# Patient Record
Sex: Male | Born: 1945 | Race: White | Hispanic: No | Marital: Married | Smoking: Current every day smoker
Health system: Southern US, Community
[De-identification: ages and names within clinical notes are randomized; demographics above are authoritative.]

## PROBLEM LIST (undated history)

## (undated) ENCOUNTER — Emergency Department (HOSPITAL_COMMUNITY): Disposition: A | Discharge status: Home / Self Care | Payer: Medicare Other

## (undated) DIAGNOSIS — R0600 Dyspnea, unspecified: Secondary | ICD-10-CM

## (undated) DIAGNOSIS — I6529 Occlusion and stenosis of unspecified carotid artery: Secondary | ICD-10-CM

## (undated) DIAGNOSIS — E785 Hyperlipidemia, unspecified: Secondary | ICD-10-CM

## (undated) DIAGNOSIS — K8020 Calculus of gallbladder without cholecystitis without obstruction: Secondary | ICD-10-CM

## (undated) DIAGNOSIS — I255 Ischemic cardiomyopathy: Secondary | ICD-10-CM

## (undated) DIAGNOSIS — I350 Nonrheumatic aortic (valve) stenosis: Secondary | ICD-10-CM

## (undated) DIAGNOSIS — I2510 Atherosclerotic heart disease of native coronary artery without angina pectoris: Secondary | ICD-10-CM

## (undated) DIAGNOSIS — K219 Gastro-esophageal reflux disease without esophagitis: Secondary | ICD-10-CM

## (undated) DIAGNOSIS — M549 Dorsalgia, unspecified: Secondary | ICD-10-CM

## (undated) DIAGNOSIS — I10 Essential (primary) hypertension: Secondary | ICD-10-CM

## (undated) DIAGNOSIS — R1310 Dysphagia, unspecified: Secondary | ICD-10-CM

## (undated) DIAGNOSIS — J449 Chronic obstructive pulmonary disease, unspecified: Secondary | ICD-10-CM

## (undated) DIAGNOSIS — I509 Heart failure, unspecified: Secondary | ICD-10-CM

## (undated) DIAGNOSIS — R93429 Abnormal radiologic findings on diagnostic imaging of unspecified kidney: Secondary | ICD-10-CM

## (undated) HISTORY — DX: Essential (primary) hypertension: I10

## (undated) HISTORY — PX: CORONARY ARTERY BYPASS GRAFT: SHX141

## (undated) HISTORY — DX: Atherosclerotic heart disease of native coronary artery without angina pectoris: I25.10

## (undated) HISTORY — DX: Dorsalgia, unspecified: M54.9

## (undated) HISTORY — DX: Hyperlipidemia, unspecified: E78.5

## (undated) HISTORY — DX: Dysphagia, unspecified: R13.10

## (undated) HISTORY — DX: Occlusion and stenosis of unspecified carotid artery: I65.29

---

## 1986-10-18 HISTORY — PX: HERNIA REPAIR: SHX51

## 1991-10-19 HISTORY — PX: CORONARY ARTERY BYPASS GRAFT: SHX141

## 1999-04-02 ENCOUNTER — Encounter: Payer: Self-pay | Admitting: *Deleted

## 1999-04-02 ENCOUNTER — Inpatient Hospital Stay (HOSPITAL_COMMUNITY): Admission: AD | Admit: 1999-04-02 | Discharge: 1999-04-04 | Payer: Self-pay | Admitting: *Deleted

## 1999-04-03 HISTORY — PX: CARDIAC CATHETERIZATION: SHX172

## 2004-11-23 ENCOUNTER — Ambulatory Visit: Payer: Self-pay | Admitting: Cardiology

## 2005-10-18 HISTORY — PX: CARPAL TUNNEL RELEASE: SHX101

## 2006-09-14 ENCOUNTER — Encounter (INDEPENDENT_AMBULATORY_CARE_PROVIDER_SITE_OTHER): Payer: Self-pay | Admitting: Specialist

## 2006-09-14 ENCOUNTER — Ambulatory Visit (HOSPITAL_BASED_OUTPATIENT_CLINIC_OR_DEPARTMENT_OTHER): Admission: RE | Admit: 2006-09-14 | Discharge: 2006-09-14 | Payer: Self-pay | Admitting: Orthopedic Surgery

## 2008-06-18 ENCOUNTER — Ambulatory Visit: Payer: Self-pay | Admitting: Cardiology

## 2008-07-05 ENCOUNTER — Ambulatory Visit: Payer: Self-pay

## 2008-07-05 ENCOUNTER — Encounter: Payer: Self-pay | Admitting: Cardiology

## 2008-07-22 ENCOUNTER — Ambulatory Visit: Payer: Self-pay | Admitting: Cardiology

## 2009-02-06 DIAGNOSIS — I25810 Atherosclerosis of coronary artery bypass graft(s) without angina pectoris: Secondary | ICD-10-CM | POA: Insufficient documentation

## 2009-02-06 DIAGNOSIS — I10 Essential (primary) hypertension: Secondary | ICD-10-CM

## 2009-02-06 DIAGNOSIS — M549 Dorsalgia, unspecified: Secondary | ICD-10-CM | POA: Insufficient documentation

## 2009-02-10 ENCOUNTER — Ambulatory Visit: Payer: Self-pay | Admitting: Cardiology

## 2009-02-20 ENCOUNTER — Encounter: Payer: Self-pay | Admitting: Cardiology

## 2009-02-24 ENCOUNTER — Encounter: Payer: Self-pay | Admitting: Cardiology

## 2009-02-25 ENCOUNTER — Encounter: Payer: Self-pay | Admitting: Cardiology

## 2009-07-14 ENCOUNTER — Ambulatory Visit: Payer: Self-pay

## 2009-07-14 ENCOUNTER — Encounter: Payer: Self-pay | Admitting: Cardiology

## 2009-07-14 DIAGNOSIS — I6529 Occlusion and stenosis of unspecified carotid artery: Secondary | ICD-10-CM

## 2010-11-07 ENCOUNTER — Encounter: Payer: Self-pay | Admitting: Orthopedic Surgery

## 2010-11-17 NOTE — Letter (Signed)
Summary: [facility] Office Visit Note  [facility] Visit Note   Imported By: Roderic Ovens 05/15/2010 12:15:52  _____________________________________________________________________  External Attachment:    Type:   Image     Comment:   External Document

## 2011-03-02 NOTE — Assessment & Plan Note (Signed)
[facility]                            CARDIOLOGY OFFICE NOTE   Steve Durham, Steve Durham                     MRN:          [id number]  DATE:07/22/2008                            DOB:          June 30, 1946    Steve Durham is in for a followup visit.  I last saw him at the beginning of  September.  His main complaint at that time was a stiff neck and he  brought his films with him today, although I assured him that I was not  really in a position to read them.  He does not think in any way that  his symptoms are related to his cardiac situation.  The patient does  have a baseline murmur.  He underwent echocardiographic studies since I  last saw him.  To be basically summarize, his LV function is normal.  His aortic valve is thickened with some restriction of motion.  Peak and  mean gradients of 12 and 11 mm respectively, and the findings compatible  with mild aortic stenosis.  RV function appeared to be normal as was RV  size.   The patient also underwent carotid evaluation with mild heterogeneous  plaque in both carotid bulbs and 0-39% bilateral ICA stenosis.  Unfortunately, despite all of these findings, his known coronary artery  disease and peripheral vascular disease is demonstrated, he does  continue to smoke most of it in his opinion is stress-related smoking,  but he acknowledges this.   MEDICATIONS:  1. Metoprolol tartrate 50 mg 1/2 tablet p.o. b.i.d.  2. Enteric-coated aspirin 325 mg daily.  3. Atacand HCT 32/12.5 daily.  4. Amlodipine and benazepril 10/20 mg daily.  5. Fish oil 1000 mg daily.  6. Vytorin 10/40 daily.  7. Fexofenadine hydrochloride 180 mg daily.   PHYSICAL EXAMINATION:  He is alert and oriented in no distress.  Blood  pressure 123/74, the pulse is 64.  The lung fields reveal prolonged  expiration with minimal rhonchi.  The cardiac exam reveals a  nondisplaced PMI with a mid peaking systolic murmur compatible with  aortic stenosis.   This is noted best at the left sternal edge with  transmission up into the upper left and upper right areas in the  parasternal region.   IMPRESSION:  1. Coronary artery disease status post coronary artery bypass graft      surgery with last catheterization April 03, 1999, with patency of      the vein graft to the distal right posterior descending coronary      artery, continued patency of the internal mammary to the left      anterior descending, occluded vein graft to the circumflex system      with 60-70% narrowing of the stent to the obtuse marginal,      currently without frequent chest pain.  2. Back pain probably related to degenerative joint disease.  3. 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  4. Mild aortic valvular stenosis.  5. Hypertension.  6. Continued tobacco use.   RECOMMENDATIONS:  1. Continued medical therapy is warranted.  2.  I have again spoken with him and some detail about tobacco      cessation, and its impact on his long-term outcome.  3. I have encouraged him to follow up with Dr. Lawana Pai, and the      consideration might be as to whether or not he want to get an MRI      of the neck as his symptoms sound fairly classic and could      represent some form of nerve compression syndrome.  4. The patient should have followup lipid profiles with Dr. Lawana Pai,      and he has had LipoProfile and MR done with LDL particle number of      1167 and a small LDL particle number of 102, which puts him in the      borderline high region.   FOLLOWUP PLAN:  Follow up in 6 months.     Arturo Morton. Riley Kill, MD, [facility]  Electronically Signed    TDS/MedQ  DD: 07/24/2008  DT: 07/25/2008  Job #: [id number]   cc:   Roney Marion, MD

## 2011-03-02 NOTE — Letter (Signed)
June 18, 2008    Fayrene Fearing B. Lawana Pai, MD  [address]  [place], [place] [address]   RE:  CHAD, DONOGHUE  MRN:  [id number]  /  DOB:  [birth year]   Dear Rosanne Ashing,   I had the pleasure of seeing Steve Durham in the office today in  followup visit.  It has been 3-1/2 years since I have seen him last.  His major complaint today is that of some discomfort which comes on at  almost any time to the left parasternal area and it does not feel like  angina that has had in the past.  He has a fairly stiff neck, and he  says he can feel the so called leader leading up to the neck being  stiff when this happens.  He denies any ongoing progressive chest  discomfort.  None of this appears to be related to exertion.  He tells  me that he has had a chest x-ray done that was fairly unremarkable.   CURRENT MEDICATIONS:  1. Metoprolol tartrate 50 mg 1/2 tablet b.i.d.  2. Enteric-coated aspirin 325 mg daily.  3. Atacand 32/12.5 daily.  4. Amlodipine and benazepril 10/20.  5. Fish oil 1000 mg 2 tablets daily.  6. Vytorin 10/40 daily.  7. Fexofenadine hydrochloride 180 mg daily.   PAST MEDICAL HISTORY:  Remarkable for revascularization surgery.  His  last catheterization was done in 2000, at which time his internal  mammary to the LAD was patent.  His vein graft to the distal circumflex  was occluded.  His first marginal had a 60-70% area where the stent had  been placed.  The right coronary artery was totally occluded.  The vein  graft to the PDA was intact, but the distal posterolateral branch was  occluded and filled by retrograde collaterals as well.   PHYSICAL EXAMINATION:  Today, he is alert and oriented in no acute  distress.  Blood pressure is 139/83, the pulse is 58.  The lung fields  are clear to auscultation and percussion.  The neck is slightly stiff at  times from movement from side to side.  He is a very soft left carotid  bruit.  His PMI is nondisplaced.  There is a normal first  and second  heart sound and he does have a systolic ejection murmur noted most  prominently in the right parasternal area.  It would be graded as 1-2/6  with no diastolic murmur appreciated.  The abdomen is soft.  Extremities  reveal no edema.   His electrocardiogram demonstrates normal sinus rhythm/sinus  bradycardia.  Biphasic T-waves are predominantly noted into II, III, and  aVF.  These are somewhat similar to what he has had the past, and  correspond reasonably well to the findings at previous catheterization  of 2000.   Overall, my sense is that this is probably not cardiac.  He has had some  neck x-rays done, and it would probably be reasonable for these to be  obtained.  Moreover, we will get a carotid Doppler, and assess his  murmur with 2-D echocardiography.  He obviously does continue to smoke,  and this is an impairment from terms of his long-term prognosis.  He has  been aware of this for many years and he had now had this above  discussion on multiple occasions.  With regard to his hyperlipidemia, I  know you are managing this, and despite excellent medications, his LDL  particle numbers remain high.   We will  see him back in followup in about 1 month, at which time, we  will reassess his overall situation.  He may need to have his cervical  spine x-rays repeated.  Thanks for allowing [place] to share in his care.    Sincerely,      Arturo Morton. Riley Kill, MD, [facility]  Electronically Signed    TDS/MedQ  DD: 06/18/2008  DT: 06/19/2008  Job #: [id number]

## 2011-03-05 NOTE — Op Note (Signed)
NAME:  Steve Durham, Steve Durham NO.:  [id number]   MEDICAL RECORD NO.:  [id number]          PATIENT TYPE:  AMB   LOCATION:  DSC                          FACILITY:  [facility]   PHYSICIAN:  Cindee Salt, M.D.       DATE OF BIRTH:  1946-03-25   DATE OF PROCEDURE:  09/14/2006  DATE OF DISCHARGE:                                 OPERATIVE REPORT   PREOPERATIVE DIAGNOSIS:  Carpal tunnel syndrome, left hand; mass, dorsal  ulnar left wrist.   POSTOPERATIVE DIAGNOSIS:  Carpal tunnel syndrome, left hand; mass, dorsal  ulnar left wrist.   OPERATION:  Release of left carpal tunnel, excision mass dorsal left wrist.   SURGEON:  Cindee Salt, M.D.   ASSISTANT:  Carolyne Fiscal R.N.   ANESTHESIA:  Forearm based IV regional.   HISTORY:  The patient is a 65 year old male with a history of carpal tunnel  syndrome, EMG nerve conductions positive not responsive to conservative  treatment and mass over the dorsal ulnar aspect of his wrist.  He is  desirous of release, excisional biopsy of the mass.  He is aware of risks  and complications that there is no guarantee with the surgery, possibility  of infection, recurrence, injury to arteries, nerves, tendons, incomplete  relief of symptoms, dystrophy.  He has decided to proceed with release of  the preoperative area.  The area of the incisions were marked by both the  patient and surgeon, questions encouraged and answered.   PROCEDURE:  The patient is brought to the operating room where forearm based  IV regional anesthetic was carried out without difficulty, he was prepped  using DuraPrep, supine position, left arm free.  A longitudinal incision was  made in the palm carried down through subcutaneous tissue.  Bleeders  electrocauterized, palmar fascia was split, superficial palmar arch  identified, flexor tendon ring and little finger identified to the ulnar  side of median nerve.  Carpal retinaculum was incised with sharp dissection,  right angle and  Sewall retractor placed between skin and forearm fascia.  Fascia released for approximately a centimeter and half proximal to the  wrist crease under direct vision.  Canal was explored, area compression of  the nerve apparent.  No further lesions were identified.  Tenosynovium  tissue was moderately thickened.  The wound was irrigated and closed with  interrupted 5-0 nylon sutures.  A separate incision was then made over the  dorsal ulnar aspect of the wrist curvilinear and longitudinal in nature,  carried down through subcutaneous tissue.  The mass was immediately  apparent.  This appeared to be a multilobulated cyst with extremely thick  wall, the radial aspect of the fifth dorsal compartment was opened.  This  was followed through the fifth dorsal compartment up to the midcarpal joint  and excised in toto.  The area was debrided with a rongeur.  The wound  irrigated.  The retinaculum was then repaired with figure-of-eight 4-0  Vicryl sutures.  The skin was repaired with interrupted 5-0 nylon sutures  after closure of the subcutaneous tissue with 4-0 Vicryl.  A dorsal  sensory  branch of the ulnar nerve was not seen in the wound.  It was looked for.  A  sterile  compressive dressing and splint was applied.  The patient tolerated the  procedure well.  Specimen was sent to pathology.  The patient was taken to  the recovery for observation in satisfactory condition.  He is discharged  home to return to the [facility] [place] in one week on Vicodin.           ______________________________  Cindee Salt, M.D.     GK/MEDQ  D:  09/14/2006  T:  09/14/2006  Job:  [id number]   cc:   Dr. Roney Marion

## 2011-04-20 ENCOUNTER — Encounter: Payer: Self-pay | Admitting: Cardiology

## 2011-06-09 ENCOUNTER — Telehealth: Payer: Self-pay | Admitting: Cardiology

## 2011-06-09 DIAGNOSIS — I2510 Atherosclerotic heart disease of native coronary artery without angina pectoris: Secondary | ICD-10-CM

## 2011-06-09 NOTE — Telephone Encounter (Signed)
Per pt call pt last saw Dr. Riley Kill in April of 2010. Pt said he needs to have a ECHO scheduled. Please return pt call to advise/discuss.

## 2011-06-09 NOTE — Telephone Encounter (Signed)
Patient has been experiencing some chest discomfort. He was due for an echo in 2011 but did not have this done. He would like to have this & follow up with Dr. Riley Kill.  Echo 06/14/11 with f/u 06/25/11.

## 2011-06-14 ENCOUNTER — Ambulatory Visit (HOSPITAL_COMMUNITY): Payer: Medicare Other | Attending: Cardiology | Admitting: Radiology

## 2011-06-14 DIAGNOSIS — I10 Essential (primary) hypertension: Secondary | ICD-10-CM | POA: Insufficient documentation

## 2011-06-14 DIAGNOSIS — I2510 Atherosclerotic heart disease of native coronary artery without angina pectoris: Secondary | ICD-10-CM

## 2011-06-14 DIAGNOSIS — I359 Nonrheumatic aortic valve disorder, unspecified: Secondary | ICD-10-CM

## 2011-06-14 DIAGNOSIS — I080 Rheumatic disorders of both mitral and aortic valves: Secondary | ICD-10-CM | POA: Insufficient documentation

## 2011-06-14 DIAGNOSIS — E785 Hyperlipidemia, unspecified: Secondary | ICD-10-CM | POA: Insufficient documentation

## 2011-06-25 ENCOUNTER — Ambulatory Visit (INDEPENDENT_AMBULATORY_CARE_PROVIDER_SITE_OTHER): Payer: Medicare Other | Admitting: Cardiology

## 2011-06-25 ENCOUNTER — Encounter: Payer: Self-pay | Admitting: Cardiology

## 2011-06-25 VITALS — BP 168/80 | HR 54 | Ht 65.0 in | Wt 158.0 lb

## 2011-06-25 DIAGNOSIS — E7800 Pure hypercholesterolemia, unspecified: Secondary | ICD-10-CM

## 2011-06-25 DIAGNOSIS — I2510 Atherosclerotic heart disease of native coronary artery without angina pectoris: Secondary | ICD-10-CM

## 2011-06-25 DIAGNOSIS — I25810 Atherosclerosis of coronary artery bypass graft(s) without angina pectoris: Secondary | ICD-10-CM

## 2011-06-25 DIAGNOSIS — I10 Essential (primary) hypertension: Secondary | ICD-10-CM

## 2011-06-25 DIAGNOSIS — I6529 Occlusion and stenosis of unspecified carotid artery: Secondary | ICD-10-CM

## 2011-06-25 DIAGNOSIS — E785 Hyperlipidemia, unspecified: Secondary | ICD-10-CM

## 2011-06-25 DIAGNOSIS — I359 Nonrheumatic aortic valve disorder, unspecified: Secondary | ICD-10-CM

## 2011-06-25 LAB — BASIC METABOLIC PANEL
CO2: 24 mEq/L (ref 19–32)
Calcium: 9 mg/dL (ref 8.4–10.5)
GFR: 89.44 mL/min (ref >60.00)
Sodium: 139 mEq/L (ref 135–145)

## 2011-06-25 LAB — LIPID PANEL
HDL: 59.6 mg/dL (ref >39.00)
Total CHOL/HDL Ratio: 2

## 2011-06-25 LAB — HEPATIC FUNCTION PANEL
Alkaline Phosphatase: 71 U/L (ref 39–117)
Bilirubin, Direct: 0.2 mg/dL (ref 0.0–0.3)

## 2011-06-25 MED ORDER — ASPIRIN EC 325 MG PO TBEC: 81.0000 mg | DELAYED_RELEASE_TABLET | Freq: Every day | ORAL | Status: DC

## 2011-06-25 MED ORDER — AMLODIPINE BESY-BENAZEPRIL HCL 5-20 MG PO CAPS: 2.0000 | ORAL_CAPSULE | Freq: Every day | ORAL | Status: DC

## 2011-06-25 MED ORDER — METOPROLOL TARTRATE 25 MG PO TABS: ORAL_TABLET | ORAL | Status: DC

## 2011-06-25 MED ORDER — NITROGLYCERIN 0.4 MG SL SUBL: 0.4000 mg | SUBLINGUAL_TABLET | SUBLINGUAL | Status: DC | PRN

## 2011-06-25 NOTE — Patient Instructions (Signed)
Your physician recommends that you schedule a follow-up appointment in: 3 months with Dr. Riley Kill. Your physician has recommended you make the following change in your medication: Take aspirin 81 mg once a day.  Your physician recommends that you return for lab work in: fasting lipid, LFT BMET. Today.

## 2011-06-27 NOTE — Progress Notes (Signed)
HPI:  He still has some symptoms, but does not want a cath.  He came to get his echo to make sure that his valve had not progressed.  He still smokes, and the prospects for quitting are not very good.  Also, Dr. Lawana Pai, his primary MD, has retired.  His discomfort is somewhat atypical in that it is usually there.  Does not change much.  Never severe, and does not really radiate.  His breathing is unchanged, without progression of symptomatic discomfort over time.  His peak aortic velocity is 2.3 m/s.    Current Outpatient Prescriptions  Medication Sig Dispense Refill  . amLODipine-benazepril (LOTREL) 5-20 MG per capsule Take 2 capsules by mouth daily.  60 capsule  11  . aspirin EC 325 MG tablet Take 1 tablet (325 mg total) by mouth daily.  30 tablet  2  . ezetimibe-simvastatin (VYTORIN) 10-40 MG per tablet Take 1 tablet by mouth at bedtime.        . fexofenadine (ALLEGRA) 180 MG tablet Take 180 mg by mouth as needed.       . metoprolol tartrate (LOPRESSOR) 25 MG tablet Taken 1/2 tablet twice a day.  30 tablet  11  . nitroGLYCERIN (NITROSTAT) 0.4 MG SL tablet Place 1 tablet (0.4 mg total) under the tongue every 5 (five) minutes as needed. May repeat times three.  25 tablet  3  . Omega-3 Fatty Acids (FISH OIL) 1000 MG CAPS Take 1,000 mg by mouth 2 (two) times daily.        Marland Kitchen omeprazole (PRILOSEC) 20 MG capsule Take 20 mg by mouth as needed.          Allergies  Allergen Reactions  . Amlodipine Besylate     REACTION: Edema    Past Medical History  Diagnosis Date  . Coronary artery disease     Artery bypass graft  . Hypertension   . Hyperlipoproteinemia   . Aortic valve disorders     Mild aortic valvular stenosis  . Back pain   . Carotid artery occlusion     Bilateral carotid plaquing without significant high-grade obstruction    Past Surgical History  Procedure Date  . Coronary artery bypass graft 04/03/99  . Cardiac catheterization 04/03/99  . Carpal tunnel release     Excision mass  dorsal left wrist  . Hernia repair 1988    Family History  Problem Relation Age of Onset  . Heart attack Mother     MI  . Hypertension Sister   . Emphysema Brother     History   Social History  . Marital Status: Married    Spouse Name: N/A    Number of Children: 1  . Years of Education: N/A   Occupational History  . Plumbing     Retired in 2007   Social History Main Topics  . Smoking status: Current Everyday Smoker  . Smokeless tobacco: Not on file  . Alcohol Use: Yes  . Drug Use:   . Sexually Active:    Other Topics Concern  . Not on file   Social History Narrative  . No narrative on file    ROS: Please see the HPI.  All other systems reviewed and negative.  PHYSICAL EXAM:  BP 168/80  Pulse 54  Ht 5\' 5"  (1.651 m)  Wt 158 lb (71.668 kg)  BMI 26.29 kg/m2  General: Well developed, well nourished, in no acute distress. Head:  Normocephalic and atraumatic. Neck: no JVD Lungs: Clear to auscultation and  percussion. Heart: Normal S1 and S2. SEM  2/6.  No DM.  NO gallops or rubs.  Abdomen:  Normal bowel sounds; soft; non tender; no organomegaly Pulses: Pulses normal in all 4 extremities. Extremities: No clubbing or cyanosis. No edema. Neurologic: Alert and oriented x 3.  EKG:  SB.  Possible LAE.  T wave abnormality, cannot exclude inferior ischemia.   ECHO:    Study Conclusions  - Left ventricle: There may be mild hypokinesis at the base of the inferior wall. The cavity size was normal. Wall thickness was increased in a pattern of mild LVH. The estimated ejection fraction was 60%. Features are consistent with a pseudonormal left ventricular filling pattern, with concomitant abnormal relaxation and increased filling pressure (grade 2 diastolic dysfunction). - Aortic valve: There is calcification of the right and noncoronary cusps with decreased motion. There is mild AS. There is mild AI. - Left atrium: The atrium was mildly to moderately dilated. -  Right ventricle: The cavity size was mildly dilated. Systolic function was mildly reduced. - Impressions: There is no change since 2010. I think the wall motion at the base of the inferior wall is not changed. There is only question of some hypokinesis. Impressions:  - There is no change since 2010. I think the wall motion at the base of the inferior wall is not changed. There is only question of some hypokinesis.     ASSESSMENT AND PLAN:

## 2011-06-28 ENCOUNTER — Telehealth: Payer: Self-pay | Admitting: Cardiology

## 2011-06-28 NOTE — Telephone Encounter (Signed)
Pt calling regarding results of lab work. Pt would like a copy of those results mailed to him.

## 2011-06-28 NOTE — Telephone Encounter (Signed)
Lab results given. Copy of results mailed to pt as requested.

## 2011-07-04 DIAGNOSIS — I2510 Atherosclerotic heart disease of native coronary artery without angina pectoris: Secondary | ICD-10-CM | POA: Insufficient documentation

## 2011-07-04 NOTE — Assessment & Plan Note (Signed)
I noted the potential benefits of ceasing smoking on BP.

## 2011-07-04 NOTE — Assessment & Plan Note (Signed)
He has mild symptoms and has not taken very good care of himself.  He is at risk for events, but continues to smoke and is unlikely to change this.  He has been encouraged. He is not interested in further evaluation at this time.

## 2011-07-04 NOTE — Assessment & Plan Note (Signed)
Will check lipid and liver given the retirement of his primary MD.

## 2011-07-04 NOTE — Assessment & Plan Note (Signed)
Not progressed much.  Suspect we could recheck again in one to two years.  Continue follow up of this.

## 2011-07-04 NOTE — Assessment & Plan Note (Signed)
0-39% two years ago, and no prominent bruits.  Recommended follow up was 2 years.  Doubt there will be a change.

## 2011-09-22 ENCOUNTER — Ambulatory Visit: Payer: Medicare Other | Admitting: Cardiology

## 2012-01-03 ENCOUNTER — Other Ambulatory Visit: Payer: Self-pay | Admitting: Cardiology

## 2012-07-06 DIAGNOSIS — Z23 Encounter for immunization: Secondary | ICD-10-CM | POA: Diagnosis not present

## 2012-07-17 DIAGNOSIS — Z79899 Other long term (current) drug therapy: Secondary | ICD-10-CM | POA: Diagnosis not present

## 2012-07-17 DIAGNOSIS — Z125 Encounter for screening for malignant neoplasm of prostate: Secondary | ICD-10-CM | POA: Diagnosis not present

## 2012-08-09 ENCOUNTER — Inpatient Hospital Stay (HOSPITAL_COMMUNITY)
Admission: AD | Admit: 2012-08-09 | Discharge: 2012-08-15 | DRG: 287 | Disposition: A | Discharge status: Home / Self Care | Payer: Medicare Other | Source: Ambulatory Visit | Attending: Cardiology | Admitting: Cardiology

## 2012-08-09 ENCOUNTER — Inpatient Hospital Stay (HOSPITAL_COMMUNITY): Payer: Medicare Other

## 2012-08-09 ENCOUNTER — Ambulatory Visit (INDEPENDENT_AMBULATORY_CARE_PROVIDER_SITE_OTHER): Payer: Medicare Other | Admitting: Cardiology

## 2012-08-09 ENCOUNTER — Encounter (HOSPITAL_COMMUNITY): Payer: Self-pay | Admitting: *Deleted

## 2012-08-09 DIAGNOSIS — I10 Essential (primary) hypertension: Secondary | ICD-10-CM

## 2012-08-09 DIAGNOSIS — I2510 Atherosclerotic heart disease of native coronary artery without angina pectoris: Secondary | ICD-10-CM | POA: Diagnosis present

## 2012-08-09 DIAGNOSIS — I359 Nonrheumatic aortic valve disorder, unspecified: Secondary | ICD-10-CM | POA: Diagnosis present

## 2012-08-09 DIAGNOSIS — E785 Hyperlipidemia, unspecified: Secondary | ICD-10-CM | POA: Diagnosis present

## 2012-08-09 DIAGNOSIS — I25810 Atherosclerosis of coronary artery bypass graft(s) without angina pectoris: Principal | ICD-10-CM | POA: Diagnosis present

## 2012-08-09 DIAGNOSIS — I200 Unstable angina: Secondary | ICD-10-CM | POA: Diagnosis not present

## 2012-08-09 DIAGNOSIS — I658 Occlusion and stenosis of other precerebral arteries: Secondary | ICD-10-CM | POA: Diagnosis present

## 2012-08-09 DIAGNOSIS — I2582 Chronic total occlusion of coronary artery: Secondary | ICD-10-CM | POA: Diagnosis present

## 2012-08-09 DIAGNOSIS — Z01810 Encounter for preprocedural cardiovascular examination: Secondary | ICD-10-CM | POA: Diagnosis not present

## 2012-08-09 DIAGNOSIS — I6529 Occlusion and stenosis of unspecified carotid artery: Secondary | ICD-10-CM | POA: Diagnosis present

## 2012-08-09 DIAGNOSIS — Z951 Presence of aortocoronary bypass graft: Secondary | ICD-10-CM | POA: Diagnosis not present

## 2012-08-09 DIAGNOSIS — I350 Nonrheumatic aortic (valve) stenosis: Secondary | ICD-10-CM

## 2012-08-09 DIAGNOSIS — Z9861 Coronary angioplasty status: Secondary | ICD-10-CM | POA: Diagnosis not present

## 2012-08-09 DIAGNOSIS — R918 Other nonspecific abnormal finding of lung field: Secondary | ICD-10-CM | POA: Diagnosis not present

## 2012-08-09 DIAGNOSIS — R079 Chest pain, unspecified: Secondary | ICD-10-CM | POA: Diagnosis not present

## 2012-08-09 DIAGNOSIS — F17200 Nicotine dependence, unspecified, uncomplicated: Secondary | ICD-10-CM | POA: Diagnosis not present

## 2012-08-09 DIAGNOSIS — I700 Atherosclerosis of aorta: Secondary | ICD-10-CM | POA: Diagnosis not present

## 2012-08-09 DIAGNOSIS — Z79899 Other long term (current) drug therapy: Secondary | ICD-10-CM

## 2012-08-09 HISTORY — DX: Hyperlipidemia, unspecified: E78.5

## 2012-08-09 HISTORY — DX: Calculus of gallbladder without cholecystitis without obstruction: K80.20

## 2012-08-09 HISTORY — DX: Abnormal radiologic findings on diagnostic imaging of unspecified kidney: R93.429

## 2012-08-09 LAB — CBC WITH DIFFERENTIAL/PLATELET
Basophils Absolute: 0.1 10*3/uL (ref 0.0–0.1)
Basophils Relative: 1 % (ref 0–1)
Eosinophils Relative: 1 % (ref 0–5)
HCT: 49.3 % (ref 39.0–52.0)
Hemoglobin: 16.6 g/dL (ref 13.0–17.0)
Lymphocytes Relative: 23 % (ref 12–46)
MCHC: 33.7 g/dL (ref 30.0–36.0)
MCV: 98 fL (ref 78.0–100.0)
Monocytes Absolute: 1.1 10*3/uL — ABNORMAL HIGH (ref 0.1–1.0)
Monocytes Relative: 10 % (ref 3–12)
Neutro Abs: 7.4 10*3/uL (ref 1.7–7.7)
RDW: 12.8 % (ref 11.5–15.5)

## 2012-08-09 LAB — COMPREHENSIVE METABOLIC PANEL
BUN: 16 mg/dL (ref 6–23)
CO2: 26 mEq/L (ref 19–32)
Calcium: 9.2 mg/dL (ref 8.4–10.5)
Chloride: 104 mEq/L (ref 96–112)
Creatinine, Ser: 0.85 mg/dL (ref 0.50–1.35)
GFR calc non Af Amer: 88 mL/min — ABNORMAL LOW (ref >90)
Total Bilirubin: 0.6 mg/dL (ref 0.3–1.2)

## 2012-08-09 LAB — TSH: TSH: 1.41 u[IU]/mL (ref 0.350–4.500)

## 2012-08-09 LAB — TROPONIN I: Troponin I: <0.3 ng/mL (ref <0.30)

## 2012-08-09 LAB — PRO B NATRIURETIC PEPTIDE: Pro B Natriuretic peptide (BNP): 629.6 pg/mL — ABNORMAL HIGH (ref 0–125)

## 2012-08-09 LAB — APTT: aPTT: 47 seconds — ABNORMAL HIGH (ref 24–37)

## 2012-08-09 MED ORDER — NITROGLYCERIN 0.4 MG SL SUBL: 0.4000 mg | SUBLINGUAL_TABLET | SUBLINGUAL | Status: DC | PRN

## 2012-08-09 MED ORDER — ASPIRIN 300 MG RE SUPP
300.0000 mg | RECTAL | Status: AC
  Filled 2012-08-09: qty 1

## 2012-08-09 MED ORDER — ASPIRIN EC 81 MG PO TBEC
81.0000 mg | DELAYED_RELEASE_TABLET | Freq: Every day | ORAL | Status: DC
  Filled 2012-08-09: qty 1

## 2012-08-09 MED ORDER — SODIUM CHLORIDE 0.9 % IV SOLN: 250.0000 mL | INTRAVENOUS | Status: DC | PRN

## 2012-08-09 MED ORDER — ACETAMINOPHEN 325 MG PO TABS: 650.0000 mg | ORAL_TABLET | ORAL | Status: DC | PRN

## 2012-08-09 MED ORDER — SODIUM CHLORIDE 0.9 % IJ SOLN: 3.0000 mL | INTRAMUSCULAR | Status: DC | PRN

## 2012-08-09 MED ORDER — ATORVASTATIN CALCIUM 40 MG PO TABS
40.0000 mg | ORAL_TABLET | Freq: Every day | ORAL | Status: DC
  Administered 2012-08-09 – 2012-08-14 (×6): 40 mg via ORAL
  Filled 2012-08-09 (×7): qty 1

## 2012-08-09 MED ORDER — METOPROLOL TARTRATE 25 MG PO TABS
25.0000 mg | ORAL_TABLET | Freq: Two times a day (BID) | ORAL | Status: DC
  Administered 2012-08-09 – 2012-08-15 (×12): 25 mg via ORAL
  Filled 2012-08-09 (×13): qty 1

## 2012-08-09 MED ORDER — NITROGLYCERIN IN D5W 200-5 MCG/ML-% IV SOLN
2.0000 ug/min | INTRAVENOUS | Status: DC
  Administered 2012-08-09: 5 ug/min via INTRAVENOUS
  Filled 2012-08-09: qty 250

## 2012-08-09 MED ORDER — SODIUM CHLORIDE 0.9 % IJ SOLN
3.0000 mL | Freq: Two times a day (BID) | INTRAMUSCULAR | Status: DC
  Administered 2012-08-10 – 2012-08-11 (×3): 3 mL via INTRAVENOUS
  Administered 2012-08-12: 20:00:00 via INTRAVENOUS
  Administered 2012-08-12 – 2012-08-14 (×3): 3 mL via INTRAVENOUS
  Administered 2012-08-14: 10 mL via INTRAVENOUS

## 2012-08-09 MED ORDER — AMLODIPINE BESYLATE 10 MG PO TABS
10.0000 mg | ORAL_TABLET | Freq: Every day | ORAL | Status: DC
  Administered 2012-08-10 – 2012-08-15 (×6): 10 mg via ORAL
  Filled 2012-08-09 (×7): qty 1

## 2012-08-09 MED ORDER — ASPIRIN 81 MG PO CHEW
324.0000 mg | CHEWABLE_TABLET | ORAL | Status: AC
  Filled 2012-08-09: qty 4

## 2012-08-09 MED ORDER — HEPARIN (PORCINE) IN NACL 100-0.45 UNIT/ML-% IJ SOLN
1200.0000 [IU]/h | INTRAMUSCULAR | Status: DC
  Administered 2012-08-09: 850 [IU]/h via INTRAVENOUS
  Filled 2012-08-09 (×2): qty 250

## 2012-08-09 MED ORDER — ONDANSETRON HCL 4 MG/2ML IJ SOLN: 4.0000 mg | Freq: Four times a day (QID) | INTRAMUSCULAR | Status: DC | PRN

## 2012-08-09 MED ORDER — NICOTINE 21 MG/24HR TD PT24
21.0000 mg | MEDICATED_PATCH | Freq: Every day | TRANSDERMAL | Status: DC
  Administered 2012-08-09 – 2012-08-15 (×7): 21 mg via TRANSDERMAL
  Filled 2012-08-09 (×8): qty 1

## 2012-08-09 MED ORDER — HEPARIN BOLUS VIA INFUSION
4000.0000 [IU] | Freq: Once | INTRAVENOUS | Status: AC
  Administered 2012-08-09: 4000 [IU] via INTRAVENOUS
  Filled 2012-08-09: qty 4000

## 2012-08-09 MED ORDER — BENAZEPRIL HCL 20 MG PO TABS
20.0000 mg | ORAL_TABLET | Freq: Every day | ORAL | Status: DC
  Administered 2012-08-10 – 2012-08-14 (×5): 20 mg via ORAL
  Filled 2012-08-09 (×6): qty 1

## 2012-08-09 NOTE — Consult Note (Signed)
ANTICOAGULATION CONSULT NOTE - Initial Consult  Pharmacy Consult for Heparin Indication: chest pain/ACS  No Known Allergies  Patient Measurements: Height: 5' 4.96" (165 cm) Weight: 152 lb 8.9 oz (69.2 kg) IBW/kg (Calculated) : 61.41  Heparin Dosing Weight: 69kg  Estimated Creatinine Clearance: 68.2 ml/min (by C-G formula based on Cr of 0.9).   Medical History: Past Medical History  Diagnosis Date  . Coronary artery disease     Artery bypass graft  . Hypertension   . Hyperlipoproteinemia   . Aortic valve disorders     Mild aortic valvular stenosis  . Back pain   . Carotid artery occlusion     Bilateral carotid plaquing without significant high-grade obstruction   Assessment: 66yom with history of CABG/PCI developed CP and diaphoresis at his clinic visit today. Also had some EKG changes. Sent to [facility] and admitted for unstable angina with plans for cath tomorrow. He will begin heparin. No labs have been drawn yet but renal function wnl from 06/24/12.  Goal of Therapy:  Heparin level 0.3-0.7 units/ml Monitor platelets by anticoagulation protocol: Yes   Plan:  1) Heparin bolus 4000 units x 1 2) Heparin drip at 850 units/hr 3) 6h heparin level 4) Daily heparin level and CBC  Fredrik Rigger 08/09/2012,11:16 AM

## 2012-08-09 NOTE — H&P (Signed)
HPI:  The patient arrived in our waiting room for an outpatient visit today. He was in for his annual visit. While there, he had the onset of severe substernal chest pain. He came into the back, and was diaphoretic with ongoing pain. He was given 2 sublingual nitroglycerin, and then the subtotally improved. The heart rate was elevated and there were some dynamic ST depression, but this eventually resolved and he is much improved at this point in time. The episode lasted about 20-30 minutes, and now he is clinically  Improved. He says he has angina requires about 2 nitroglycerin per day. We had suggested about a year ago that he consider having a repeat cardiac catheterization for accelerated angina. He does unfortunately continue to smoke.  No current facility-administered medications for this encounter.   Current Outpatient Prescriptions  Medication Sig Dispense Refill  . amLODipine-benazepril (LOTREL) 5-20 MG per capsule Take 2 capsules by mouth daily.  60 capsule  11  . aspirin EC 325 MG tablet Take 1 tablet (325 mg total) by mouth daily.  30 tablet  2  . fexofenadine (ALLEGRA) 180 MG tablet Take 180 mg by mouth as needed.       . metoprolol tartrate (LOPRESSOR) 25 MG tablet Taken 1/2 tablet twice a day.  30 tablet  11  . nitroGLYCERIN (NITROSTAT) 0.4 MG SL tablet Place 1 tablet (0.4 mg total) under the tongue every 5 (five) minutes as needed. May repeat times three.  25 tablet  3  . Omega-3 Fatty Acids (FISH OIL) 1000 MG CAPS Take 1,000 mg by mouth 2 (two) times daily.        Marland Kitchen omeprazole (PRILOSEC) 20 MG capsule Take 20 mg by mouth as needed.        Marland Kitchen VYTORIN 10-40 MG per tablet TAKE 1 TABLET EVERY DAY  30 tablet  9    Allergies  Allergen Reactions  . Amlodipine Besylate     REACTION: Edema    Past Medical History  Diagnosis Date  . Coronary artery disease     Artery bypass graft  . Hypertension   . Hyperlipoproteinemia   . Aortic valve disorders     Mild aortic valvular  stenosis  . Back pain   . Carotid artery occlusion     Bilateral carotid plaquing without significant high-grade obstruction    Past Surgical History  Procedure Date  . Coronary artery bypass graft 04/03/99  . Cardiac catheterization 04/03/99  . Carpal tunnel release     Excision mass dorsal left wrist  . Hernia repair 1988    Family History  Problem Relation Age of Onset  . Heart attack Mother     MI  . Hypertension Sister   . Emphysema Brother     History   Social History  . Marital Status: Married    Spouse Name: N/A    Number of Children: 1  . Years of Education: N/A   Occupational History  . Plumbing     Retired in 2007   Social History Main Topics  . Smoking status: Current Every Day Smoker  . Smokeless tobacco: Not on file  . Alcohol Use: Yes  . Drug Use:   . Sexually Active:    Other Topics Concern  . Not on file   Social History Narrative  . No narrative on file    ROS: HEENt neg.  Intermittent CP.  Mild cough.  No hemoptysis, no melena, no hematochezia.  No abdominal pain.  ROS  otherwise negative.    PHYSICAL EXAM:  There were no vitals taken for this visit.  General: Well developed, well nourished, in no acute distress. Head:  Normocephalic and atraumatic. Neck: no JVD Lungs: Clear to auscultation and percussion. Heart: Normal S1 and S2. SEM ULSE.  Soft apical murmur.   Abdomen:  Normal bowel sounds; soft; non tender; no organomegaly Pulses: Pulses normal slightly diminished in RLE.  R fem bruit.   Extremities: No clubbing or cyanosis. No edema. Neurologic: Alert and oriented x 3.  EKG:  1.  ST. Biaatrial enlargement.  Mild lateral ST depression 2.  Repeat reduced ST depression.  Rate down.    ASSESSMENT AND PLAN:  1.  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  Only in [place] paper chart which is unavailable.   2.  Continued tobacco.    Plan  1.  Admit 2.  IV heparin and  NTG. 3.  2D echo. 4.  Labs 5.  Plan cath tomorrow.

## 2012-08-09 NOTE — Progress Notes (Signed)
The pt came into the office and was checking in at the front desk when he complained of chest pain and grabbed the center of his chest.  The pt became diaphoretic and was brought back to an exam room for evaluation. BP checked 230/100, 1 SL NTG given at 1005 and EKG obtained.  EKG changes noted and Dr Riley Kill was notified.  O2 started at 2 L/min Jansen and pt placed on monitor.  MD in the room to examine pt. Pt still diaphoretic and complaining of CP. 1 SL NTG given at 1011. The pt did get some relief from this dosage. The pt initially refused admission to [facility] but after thoroughly explaining the cause of pt's current symptoms he verbally consented to admission. IV started 18 gauge Right arm with 0.9% NS started at [facility]. 1030 148/82, pulse 98 (NSR on monitor) and 12 lead EKG obtained per MD order. EMS contacted and pt transported to hospital. Exit BP 160/80.

## 2012-08-09 NOTE — Consult Note (Signed)
ANTICOAGULATION CONSULT NOTE - Follow up Consult  Pharmacy Consult for Heparin Indication: chest pain/ACS  No Known Allergies  Patient Measurements: Height: 5\' 6"  (167.6 cm) Weight: 150 lb 5.7 oz (68.2 kg) IBW/kg (Calculated) : 63.8  Heparin Dosing Weight: 69kg  Estimated Creatinine Clearance: 75.1 ml/min (by C-G formula based on Cr of 0.85).   Medical History: Past Medical History  Diagnosis Date  . Coronary artery disease     Artery bypass graft  . Hypertension   . Hyperlipoproteinemia   . Aortic valve disorders     Mild aortic valvular stenosis  . Back pain   . Carotid artery occlusion     Bilateral carotid plaquing without significant high-grade obstruction   Assessment: 66yom with history of CABG/PCI developed CP and diaphoresis at his clinic visit today. Also had some EKG changes. Sent to [facility] and admitted for unstable angina with plans for cath tomorrow. He was started on heparin. First hepain level = 0.24 which is below goal Goal of Therapy:  Heparin level 0.3-0.7 units/ml Monitor platelets by anticoagulation protocol: Yes   Plan:  - increase heparin to 1100 units/hr - heparin level in 6 hours - adjust per goal - daily CBC and heparin level while on heparin.  Mickeal Skinner 08/09/2012,7:26 PM

## 2012-08-10 ENCOUNTER — Encounter (HOSPITAL_COMMUNITY)
Admission: AD | Disposition: A | Discharge status: Home / Self Care | Payer: Self-pay | Source: Ambulatory Visit | Attending: Cardiology

## 2012-08-10 ENCOUNTER — Other Ambulatory Visit: Payer: Self-pay | Admitting: Cardiothoracic Surgery

## 2012-08-10 ENCOUNTER — Encounter (HOSPITAL_COMMUNITY): Payer: Self-pay | Admitting: Cardiology

## 2012-08-10 DIAGNOSIS — I359 Nonrheumatic aortic valve disorder, unspecified: Secondary | ICD-10-CM

## 2012-08-10 DIAGNOSIS — I2510 Atherosclerotic heart disease of native coronary artery without angina pectoris: Secondary | ICD-10-CM

## 2012-08-10 HISTORY — PX: LEFT HEART CATHETERIZATION WITH CORONARY/GRAFT ANGIOGRAM: SHX5450

## 2012-08-10 LAB — BASIC METABOLIC PANEL
BUN: 15 mg/dL (ref 6–23)
CO2: 25 mEq/L (ref 19–32)
Calcium: 8.7 mg/dL (ref 8.4–10.5)
Creatinine, Ser: 0.71 mg/dL (ref 0.50–1.35)
GFR calc non Af Amer: >90 mL/min (ref >90)
Glucose, Bld: 149 mg/dL — ABNORMAL HIGH (ref 70–99)
Sodium: 139 mEq/L (ref 135–145)

## 2012-08-10 LAB — CBC
MCH: 32 pg (ref 26.0–34.0)
MCHC: 32.8 g/dL (ref 30.0–36.0)
MCV: 97.5 fL (ref 78.0–100.0)
Platelets: 205 10*3/uL (ref 150–400)
RBC: 4.81 MIL/uL (ref 4.22–5.81)
RDW: 12.8 % (ref 11.5–15.5)

## 2012-08-10 LAB — POCT ACTIVATED CLOTTING TIME: Activated Clotting Time: 109 seconds

## 2012-08-10 LAB — LIPID PANEL
HDL: 53 mg/dL (ref >39)
LDL Cholesterol: 58 mg/dL (ref 0–99)
Total CHOL/HDL Ratio: 2.4 RATIO
Triglycerides: 88 mg/dL (ref <150)

## 2012-08-10 LAB — HEMOGLOBIN A1C: Hgb A1c MFr Bld: 5.7 % — ABNORMAL HIGH (ref <5.7)

## 2012-08-10 SURGERY — LEFT HEART CATHETERIZATION WITH CORONARY/GRAFT ANGIOGRAM
Anesthesia: LOCAL

## 2012-08-10 MED ORDER — ASPIRIN EC 81 MG PO TBEC: 81.0000 mg | DELAYED_RELEASE_TABLET | Freq: Every day | ORAL | Status: DC

## 2012-08-10 MED ORDER — MIDAZOLAM HCL 2 MG/2ML IJ SOLN
INTRAMUSCULAR | Status: AC
  Filled 2012-08-10: qty 2

## 2012-08-10 MED ORDER — SODIUM CHLORIDE 0.9 % IV SOLN
INTRAVENOUS | Status: DC
  Administered 2012-08-10: 06:00:00 via INTRAVENOUS

## 2012-08-10 MED ORDER — NITROGLYCERIN 0.2 MG/ML ON CALL CATH LAB
INTRAVENOUS | Status: AC
  Filled 2012-08-10: qty 1

## 2012-08-10 MED ORDER — FENTANYL CITRATE 0.05 MG/ML IJ SOLN
INTRAMUSCULAR | Status: AC
  Filled 2012-08-10: qty 2

## 2012-08-10 MED ORDER — SODIUM CHLORIDE 0.9 % IV SOLN
INTRAVENOUS | Status: AC
  Administered 2012-08-10: 10:00:00 via INTRAVENOUS

## 2012-08-10 MED ORDER — HEPARIN (PORCINE) IN NACL 2-0.9 UNIT/ML-% IJ SOLN
INTRAMUSCULAR | Status: AC
  Filled 2012-08-10: qty 1500

## 2012-08-10 MED ORDER — ASPIRIN 81 MG PO CHEW
324.0000 mg | CHEWABLE_TABLET | Freq: Once | ORAL | Status: AC
  Administered 2012-08-10: 324 mg via ORAL

## 2012-08-10 MED ORDER — LIDOCAINE HCL (PF) 1 % IJ SOLN
INTRAMUSCULAR | Status: AC
  Filled 2012-08-10: qty 30

## 2012-08-10 NOTE — Consult Note (Signed)
ANTICOAGULATION CONSULT NOTE  Pharmacy Consult for Heparin Indication: chest pain/ACS  No Known Allergies  Patient Measurements: Height: 5\' 6"  (167.6 cm) Weight: 150 lb 5.7 oz (68.2 kg) IBW/kg (Calculated) : 63.8  Heparin Dosing Weight: 69kg  Estimated Creatinine Clearance: 79.8 ml/min (by C-G formula based on Cr of 0.71).   Medical History: Past Medical History  Diagnosis Date  . Coronary artery disease     Artery bypass graft  . Hypertension   . Hyperlipoproteinemia   . Aortic valve disorders     Mild aortic valvular stenosis  . Back pain   . Carotid artery occlusion     Bilateral carotid plaquing without significant high-grade obstruction   Assessment: 66 yo male with chest pain for Heparin  Goal of Therapy:  Heparin level 0.3-0.7 units/ml Monitor platelets by anticoagulation protocol: Yes   Plan:  Increase Heparin 1200 units/hr F/U after cath  Eddie Candle 08/10/2012,4:12 AM

## 2012-08-10 NOTE — Progress Notes (Signed)
  Echocardiogram 2D Echocardiogram has been performed.  Cathie Beams 08/10/2012, 3:58 PM

## 2012-08-10 NOTE — Consult Note (Signed)
ANTICOAGULATION CONSULT NOTE  Pharmacy Consult for Heparin Indication: chest pain/ACS  Assessment: 66 yo male with chest pain for Heparin which was turned off for cardiac cath this morning.  He has severe disease and TCTS consult is in place.  Heparin consult discontinued post cath by Dr. Riley Kill.  CBC is stable as is platelets and no noted bleeding.  Plan:  1.  Will discontinue daily labs 2.  Please let us know if we can assist further in the care of this patient.  Nadara Mustard, PharmD., MS Clinical Pharmacist Pager:  [phone] Thank you for allowing pharmacy to be part of this patients care team. 08/10/2012,1:28 PM

## 2012-08-10 NOTE — Interval H&P Note (Signed)
History and Physical Interval Note:  08/10/2012 8:44 AM  Steve Durham  has presented today for surgery, with the diagnosis of Chest pain  The various methods of treatment have been discussed with the patient. After consideration of risks, benefits and other options for treatment, the patient has consented to  Procedure(s) (LRB) with comments: LEFT HEART CATHETERIZATION WITH CORONARY/GRAFT ANGIOGRAM (N/A) as a surgical intervention .  The patient's history has been reviewed, patient examined, no change in status, stable for surgery.  I have reviewed the patient's chart and labs.  Questions were answered to the patient's satisfaction.     Shawnie Pons

## 2012-08-10 NOTE — CV Procedure (Signed)
   Cardiac Catheterization Procedure Note  Name: Steve Durham MRN: [id number] DOB: 22-Dec-1945  Procedure: Left Heart Cath, Selective Coronary Angiography, SVG angio, SLIMA,  LV angiography, aortic root angio  Indication: unstable angina   Procedural details: The right groin was prepped, draped, and anesthetized with 1% lidocaine. Using modified Seldinger technique, a 5 French sheath was introduced into the left femoral artery. Standard Judkins catheters were used for coronary angiography and left ventriculography. Catheter exchanges were performed over a guidewire. There were no immediate procedural complications. The patient was transferred to the post catheterization recovery area for further monitoring.  I then spoke with the family.    Procedural Findings: Hemodynamics:  AO 151/71 (102) LV 162/19   Coronary angiography: Coronary dominance: right  Left mainstem:  Heavily calcified.  There is diffuse 70% narrowing in the LMCA.  It is diseased to the take off of the large OM  Left anterior descending (LAD): Occluded after the septal  Left circumflex (LCx): Diffuse 70-80% segmental disease leading to a very large OM.  This collateralizes multiple vessels including the PDA.  It also retrograde fills a diagonal or ramus branch  Right coronary artery (RCA): 90% ostial and totally occluded.   SVG to the PDA PLA is occluded  SVG to the OM is occluded.    LIMA to the diagonal and LAD is intact but there is 70-80% narrowing near the LAD insertion.  The LAD also collateralizes multiple vessels  Left ventriculography: Left ventricular systolic function is reduced, with inferobasal hypokinesis.  EF estimate is 45%-50%.    Aortic root:  Calcified leaflets with moderate AI.     Final Conclusions:  1.  Occluded SVGs to the OM and RCA system. 2.  Segmental LMCA and OM disease supplying large system. 3.  Patent IMA with touchdown disease at insertion. 4.  Moderate AI,  AS  Plan;  TCTS consult.    Recommendations:    Shawnie Pons 08/10/2012, 10:22 AM

## 2012-08-11 ENCOUNTER — Inpatient Hospital Stay (HOSPITAL_COMMUNITY): Payer: Medicare Other

## 2012-08-11 DIAGNOSIS — I350 Nonrheumatic aortic (valve) stenosis: Secondary | ICD-10-CM

## 2012-08-11 DIAGNOSIS — I200 Unstable angina: Secondary | ICD-10-CM

## 2012-08-11 DIAGNOSIS — E785 Hyperlipidemia, unspecified: Secondary | ICD-10-CM

## 2012-08-11 DIAGNOSIS — Z01810 Encounter for preprocedural cardiovascular examination: Secondary | ICD-10-CM

## 2012-08-11 MED ORDER — ASPIRIN EC 81 MG PO TBEC
81.0000 mg | DELAYED_RELEASE_TABLET | Freq: Every day | ORAL | Status: DC
  Administered 2012-08-11 – 2012-08-15 (×5): 81 mg via ORAL
  Filled 2012-08-11 (×5): qty 1

## 2012-08-11 MED ORDER — ALBUTEROL SULFATE (5 MG/ML) 0.5% IN NEBU
2.5000 mg | INHALATION_SOLUTION | Freq: Once | RESPIRATORY_TRACT | Status: AC
  Administered 2012-08-11: 2.5 mg via RESPIRATORY_TRACT

## 2012-08-11 MED ORDER — ENOXAPARIN SODIUM 40 MG/0.4ML ~~LOC~~ SOLN
40.0000 mg | SUBCUTANEOUS | Status: DC
  Administered 2012-08-11 – 2012-08-13 (×3): 40 mg via SUBCUTANEOUS
  Filled 2012-08-11 (×5): qty 0.4

## 2012-08-11 NOTE — Progress Notes (Signed)
Patient Name: Steve Durham Date of Encounter: 08/11/2012   Principal Problem:  *Unstable angina Active Problems:  CAD, ARTERY BYPASS GRAFT  HYPERTENSION  CAROTID ARTERY DISEASE  Aortic stenosis  Hyperlipidemia   SUBJECTIVE  No c/p, sob overnight.  Eager to go home.  Awaiting TCTS consult.  CURRENT MEDS    . amLODipine  10 mg Oral Daily  . aspirin  324 mg Oral NOW   Or  . aspirin  300 mg Rectal NOW  . atorvastatin  40 mg Oral q1800  . benazepril  20 mg Oral Daily  . fentaNYL      . heparin      . lidocaine      . metoprolol tartrate  25 mg Oral BID  . midazolam      . nicotine  21 mg Transdermal Daily  . nitroGLYCERIN      . sodium chloride  3 mL Intravenous Q12H  . DISCONTD: aspirin EC  81 mg Oral Daily    OBJECTIVE  Filed Vitals:   08/11/12 0000 08/11/12 0200 08/11/12 0400 08/11/12 0500  BP: 144/67 147/80 157/69   Pulse:      Temp: 98 F (36.7 C)  97.9 F (36.6 C)   TempSrc: Oral  Oral   Resp: 18 16 16    Height:      Weight:    147 lb 11.3 oz (67 kg)  SpO2: 96% 97% 96%     Intake/Output Summary (Last 24 hours) at 08/11/12 0813 Last data filed at 08/10/12 2200  Gross per 24 hour  Intake   1640 ml  Output   2450 ml  Net   -810 ml   Filed Weights   08/09/12 1100 08/11/12 0500  Weight: 150 lb 5.7 oz (68.2 kg) 147 lb 11.3 oz (67 kg)    PHYSICAL EXAM  General: Pleasant, NAD. Neuro: Alert and oriented X 3. Moves all extremities spontaneously. Psych: Normal affect. HEENT:  Normal  Neck: Supple without bruits or JVD. Lungs:  Resp regular and unlabored, CTA. Heart: RRR no s3, s4, 3/6 sem @ rusb. Abdomen: Soft, non-tender, non-distended, BS + x 4.  Extremities: No clubbing, cyanosis or edema. DP/PT/Radials 2+ and equal bilaterally.  Accessory Clinical Findings  CBC  Basename 08/10/12 0124 08/09/12 1742  WBC 11.3* 11.3*  NEUTROABS -- 7.4  HGB 15.4 16.6  HCT 46.9 49.3  MCV 97.5 98.0  PLT 205 237   Basic Metabolic Panel  Basename  08/10/12 0124 08/09/12 1742  NA 139 142  K 3.5 4.1  CL 104 104  CO2 25 26  GLUCOSE 149* 109*  BUN 15 16  CREATININE 0.71 0.85  CALCIUM 8.7 9.2  MG -- --  PHOS -- --   Liver Function Tests  Basename 08/09/12 1742  AST 26  ALT 23  ALKPHOS 83  BILITOT 0.6  PROT 7.7  ALBUMIN 3.8   Cardiac Enzymes  Basename 08/09/12 1749  CKTOTAL --  CKMB --  CKMBINDEX --  TROPONINI <0.30   Hemoglobin A1C  Basename 08/09/12 1742  HGBA1C 5.7*   Fasting Lipid Panel  Basename 08/10/12 0124  CHOL 129  HDL 53  LDLCALC 58  TRIG 88  CHOLHDL 2.4  LDLDIRECT --   Thyroid Function Tests  Basename 08/09/12 1742  TSH 1.410  T4TOTAL --  T3FREE --  THYROIDAB --    TELE  rsr  ECG  Rsr, 60, inf q's with inflat st/t changes.  Radiology/Studies  Dg Chest 2 View  08/09/2012  *  RADIOLOGY REPORT*  Clinical Data: Chest pain.  Smoker.  CHEST - 2 VIEW  Comparison: 03/27/2007.  Findings: Post CABG.  No infiltrate, congestive heart failure or pneumothorax.  Heart size within normal limits.  Calcified aorta.  Carotid bifurcation calcifications.  IMPRESSION: No infiltrate or congestive heart failure.  Post CABG with slightly tortuous aorta.   Original Report Authenticated By: Fuller Canada, M.D.    Cardiac Catheterization  Hemodynamics:  AO 151/71 (102) LV 162/19              Coronary angiography: Coronary dominance: right  Left mainstem:  Heavily calcified.  There is diffuse 70% narrowing in the LMCA.  It is diseased to the take off of the large OM Left anterior descending (LAD): Occluded after the septal Left circumflex (LCx): Diffuse 70-80% segmental disease leading to a very large OM.  This collateralizes multiple vessels including the PDA.  It also retrograde fills a diagonal or ramus branch Right coronary artery (RCA): 90% ostial and totally occluded.  SVG to the PDA PLA is occluded SVG to the OM is occluded.   LIMA to the diagonal and LAD is intact but there is 70-80%  narrowing near the LAD insertion.  The LAD also collateralizes multiple vessels Left ventriculography: Left ventricular systolic function is reduced, with inferobasal hypokinesis.  EF estimate is 45%-50%.    Aortic root:  Calcified leaflets with moderate AI.     Final Conclusions:  1.  Occluded SVGs to the OM and RCA system. 2.  Segmental LMCA and OM disease supplying large system. 3.  Patent IMA with touchdown disease at insertion. 4.  Moderate AI, AS _____________  2D Echocardiogram Study Conclusions  - Left ventricle: The cavity size was normal. Wall thickness   was normal. Systolic function was normal. The estimated   ejection fraction was in the range of 55% to 60%. - Aortic valve: Moderately calcified annulus. There was mild   stenosis. Valve area: 1.45cm^2(VTI). Valve area: 1.71cm^2   (Vmax). Valve area: 1.45cm^2 (Vmean). _____________  ASSESSMENT AND PLAN  1.  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  Pain free this AM.  TCTS consult pending.  Pt eager to go home.  Cont bb, statin, acei, ccb.  Add asa.  2.  AS:  Mild by echo w/o AI.  TCTS to review.  3.  Tob Abuse:  Cessation advised.  4.  HL:  Cont statin.  LDL 58.  Signed, Nicolasa Ducking NP  Patient stable and doing well.  I have personally seen and examined the patient.    Dr. Tyrone Sage is out of town by TCTS office told me someone would see the patient.  He is awaiting surgical consult.  He is now pain free but looked bad in the office yesterday.  His echo is consistent with mild to moderate AS.  He will need AVR with redo. Will move him to 2000 or equivalent at this point in time.  Add Subcu lovenox.

## 2012-08-11 NOTE — Progress Notes (Addendum)
VASCULAR LAB PRELIMINARY  PRELIMINARY  PRELIMINARY  PRELIMINARY  Pre-op Cardiac Surgery  Carotid Findings:  Bilateral:  No evidence of hemodynamically significant internal carotid artery stenosis.   Vertebral artery flow is antegrade.     Upper Extremity Right Left  Brachial Pressures 160 Triphasic 162 Triphasic  Radial Waveforms Triphasic Triphasic  Ulnar Waveforms Triphasic Triphasic  Palmar Arch (Allen's Test) Normal Normal   Findings:  Doppler waveforms remained normal bilaterally with both radial and ulnar compressionas    Lower  Extremity Right Left  Dorsalis Pedis    Anterior Tibial    Posterior Tibial    Ankle/Brachial Indices      Findings:  Palpable pedal pulses bilaterally   BIGGS, SANDRA, RVT 08/11/2012, 12:34 PM  Shelisha Gautier, RVS 08/14/2102, 10:10 AM

## 2012-08-12 NOTE — Progress Notes (Signed)
SUBJECTIVE:  No chest pain or SOB  OBJECTIVE:   Vitals:   Filed Vitals:   08/11/12 1200 08/11/12 1541 08/11/12 1952 08/12/12 0421  BP: 147/76 145/66 167/74 137/77  Pulse:    56  Temp: 97.9 F (36.6 C) 98 F (36.7 C) 98.6 F (37 C) 97.5 F (36.4 C)  TempSrc: Oral Oral  Oral  Resp: 16 16  17   Height:      Weight:    67.949 kg (149 lb 12.8 oz)  SpO2: 99% 95% 100% 96%   I&O's:   Intake/Output Summary (Last 24 hours) at 08/12/12 1046 Last data filed at 08/11/12 1800  Gross per 24 hour  Intake    480 ml  Output      0 ml  Net    480 ml   TELEMETRY: Reviewed telemetry pt in*NSR     PHYSICAL EXAM General: Well developed, well nourished, in no acute distress Head: Eyes PERRLA, No xanthomas.   Normal cephalic and atramatic  Lungs:   Clear bilaterally to auscultation and percussion. Heart:   HRRR S1 S2 Pulses are 2+ & equal. Abdomen: Bowel sounds are positive, abdomen soft and non-tender without masses Extremities:   No clubbing, cyanosis or edema.  DP +1 Neuro: Alert and oriented X 3. Psych:  Good affect, responds appropriately   LABS: Basic Metabolic Panel:  Basename 08/10/12 0124 08/09/12 1742  NA 139 142  K 3.5 4.1  CL 104 104  CO2 25 26  GLUCOSE 149* 109*  BUN 15 16  CREATININE 0.71 0.85  CALCIUM 8.7 9.2  MG -- --  PHOS -- --   Liver Function Tests:  Basename 08/09/12 1742  AST 26  ALT 23  ALKPHOS 83  BILITOT 0.6  PROT 7.7  ALBUMIN 3.8   No results found for this basename: LIPASE:2,AMYLASE:2 in the last 72 hours CBC:  Basename 08/10/12 0124 08/09/12 1742  WBC 11.3* 11.3*  NEUTROABS -- 7.4  HGB 15.4 16.6  HCT 46.9 49.3  MCV 97.5 98.0  PLT 205 237   Cardiac Enzymes:  Basename 08/09/12 1749  CKTOTAL --  CKMB --  CKMBINDEX --  TROPONINI <0.30   BNP: No components found with this basename: POCBNP:3 D-Dimer: No results found for this basename: DDIMER:2 in the last 72 hours Hemoglobin A1C:  Basename 08/09/12 1742  HGBA1C 5.7*    Fasting Lipid Panel:  Basename 08/10/12 0124  CHOL 129  HDL 53  LDLCALC 58  TRIG 88  CHOLHDL 2.4  LDLDIRECT --   Thyroid Function Tests:  Basename 08/09/12 1742  TSH 1.410  T4TOTAL --  T3FREE --  THYROIDAB --   Anemia Panel: No results found for this basename: VITAMINB12,FOLATE,FERRITIN,TIBC,IRON,RETICCTPCT in the last 72 hours Coag Panel:   Lab Results  Component Value Date   INR 0.95 08/09/2012    RADIOLOGY: Dg Chest 2 View  08/09/2012  *RADIOLOGY REPORT*  Clinical Data: Chest pain.  Smoker.  CHEST - 2 VIEW  Comparison: 03/27/2007.  Findings: Post CABG.  No infiltrate, congestive heart failure or pneumothorax.  Heart size within normal limits.  Calcified aorta.  Carotid bifurcation calcifications.  IMPRESSION: No infiltrate or congestive heart failure.  Post CABG with slightly tortuous aorta.   Original Report Authenticated By: Fuller Canada, M.D.     ASSESSMENT/PLAN: 1. USA/CAD: S/p cath  revealing 3vd and severe graft dzs. Pain free this AM. TCTS consult pending. Pt eager to go home. Cont bb, statin, acei, ccb. Add asa.  2. AS: Mild by  echo w/o AI. TCTS to review.  3. Tob Abuse: Cessation advised.  4. HL: Cont statin. LDL 58.    Quintella Reichert, MD  08/12/2012  10:46 AM

## 2012-08-13 NOTE — Progress Notes (Signed)
SUBJECTIVE:  No further chest pain or SOB  OBJECTIVE:   Vitals:   Filed Vitals:   08/12/12 0421 08/12/12 1420 08/12/12 1917 08/13/12 0517  BP: 137/77 153/67 125/74 147/75  Pulse: 56 56 61 54  Temp: 97.5 F (36.4 C) 97.9 F (36.6 C) 98.3 F (36.8 C) 97.8 F (36.6 C)  TempSrc: Oral Oral Oral Oral  Resp: 17 16 18 20   Height:      Weight: 67.949 kg (149 lb 12.8 oz)   68.402 kg (150 lb 12.8 oz)  SpO2: 96% 99% 97% 96%   I&O's:   Intake/Output Summary (Last 24 hours) at 08/13/12 1129 Last data filed at 08/12/12 1840  Gross per 24 hour  Intake    480 ml  Output      0 ml  Net    480 ml   TELEMETRY: Reviewed telemetry pt in NSR:     PHYSICAL EXAM General: Well developed, well nourished, in no acute distress Head: Eyes PERRLA, No xanthomas.   Normal cephalic and atramatic  Lungs:   Clear bilaterally to auscultation and percussion. Heart:   HRRR S1 S2 Pulses are 2+ & equal. Abdomen: Bowel sounds are positive, abdomen soft and non-tender without masses Extremities:   No clubbing, cyanosis or edema.  DP +1 Neuro: Alert and oriented X 3. Psych:  Good affect, responds appropriately   Coag Panel:   Lab Results  Component Value Date   INR 0.95 08/09/2012    RADIOLOGY: Dg Chest 2 View  08/09/2012  *RADIOLOGY REPORT*  Clinical Data: Chest pain.  Smoker.  CHEST - 2 VIEW  Comparison: 03/27/2007.  Findings: Post CABG.  No infiltrate, congestive heart failure or pneumothorax.  Heart size within normal limits.  Calcified aorta.  Carotid bifurcation calcifications.  IMPRESSION: No infiltrate or congestive heart failure.  Post CABG with slightly tortuous aorta.   Original Report Authenticated By: Fuller Canada, M.D.    ASSESSMENT/PLAN:  1. 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 Pain free this AM. TCTS consult pending - I have spoken with Dr. Sharee Pimple PA who will let Dr. Laneta Simmers know that patient needs consult today. Pt eager to go home. Cont bb, statin, acei, ccb, ASA 2.  AS: Mild by echo w/o AI. TCTS to review.  3. Tob Abuse: Cessation advised.  4. HL: Cont statin. LDL 58.     Quintella Reichert, MD  08/13/2012  11:29 AM

## 2012-08-14 DIAGNOSIS — I25810 Atherosclerosis of coronary artery bypass graft(s) without angina pectoris: Secondary | ICD-10-CM

## 2012-08-14 DIAGNOSIS — I10 Essential (primary) hypertension: Secondary | ICD-10-CM

## 2012-08-14 DIAGNOSIS — Z01810 Encounter for preprocedural cardiovascular examination: Secondary | ICD-10-CM

## 2012-08-14 DIAGNOSIS — I359 Nonrheumatic aortic valve disorder, unspecified: Secondary | ICD-10-CM

## 2012-08-14 DIAGNOSIS — I2510 Atherosclerotic heart disease of native coronary artery without angina pectoris: Secondary | ICD-10-CM

## 2012-08-14 MED ORDER — BENAZEPRIL HCL 40 MG PO TABS
40.0000 mg | ORAL_TABLET | Freq: Every day | ORAL | Status: DC
  Administered 2012-08-15: 40 mg via ORAL
  Filled 2012-08-14: qty 1

## 2012-08-14 NOTE — Consult Note (Signed)
[address]            [place] [address]          [phone]      Steve Durham [place] Medical Record #[id number] Date of Birth: August 22, 1946  Referring: Dr Riley Kill Primary Care: No primary provider on file.  Chief Complaint:  Chest pain   History of Present Illness:    The patient was in [place] waiting room for yearly visit with Dr Riley Kill. .  While there, he had the onset of severe substernal chest pain. Hewas diaphoretic with ongoing pain. He was given 2 sublingual nitroglycerin, and improved. The heart rate was elevated and there were some dynamic ST depression.. The episode lasted about 20-30 minutes, and now he is clinically Improved. He says he has angina requires about 2 nitroglycerin per day. We had suggested about a year ago that he consider having a repeat cardiac catheterization for accelerated angina.    In 1993 He under went CABG x 5 11/05/1991 ( see paper chart for old op report) [place] sequentially proximal and distal LAD, vein distal circumflex, vein to pd and pl. The note at that time specific notes poor quality targets for redo bypass. Patient says that he has had two stents since 1993 but the records on this are lacking from cardiology notes. Repeat cath done  04/03/1999 by Dr Riley Kill ( in paper chart)  Serial echo cardiograms have been done indicating mild AS and mild AI.   He continues  to smoke.   Current Activity/ Functional Status: Patient is  independent with mobility/ambulation, transfers, ADL's, IADL's.   Past Medical History  Diagnosis Date  . Coronary artery disease     Artery bypass graft  . Hypertension   . Hyperlipoproteinemia   . Aortic valve disorders     Mild aortic valvular stenosis  . Back pain   . Carotid artery occlusion     Bilateral carotid plaquing without significant high-grade obstruction    Past Surgical History  Procedure Date  . CABG x 5 see above and paper chart for details 11/05/1991    . Cardiac catheterization 04/03/99  . Carpal tunnel release     Excision mass dorsal left wrist  . Hernia repair 1988    Family History  Problem Relation Age of Onset  . Heart attack Mother     MI  . Hypertension Sister   . Emphysema Brother     History   Social History  . Marital Status: Married    Spouse Name: N/A    Number of Children: 1  . Years of Education: N/A   Occupational History  . Plumbing     Retired in 2007   Social History Main Topics  . Smoking status: Current Every Day Smoker -- 1.5 packs/day for 50 years  . Smokeless tobacco: Not on file  . Alcohol Use: 8.4 oz/week    14 Cans of beer per week  . Drug Use: No  . Sexually Active:       History  Smoking status  . Current Every Day Smoker -- 1.5 packs/day for 50 years  Smokeless tobacco  . Not on file    History  Alcohol Use  . 8.4 oz/week  . 14 Cans of beer per week     No Known Allergies  Current Facility-Administered Medications  Medication Dose Route Frequency Provider Last  Rate Last Dose  . acetaminophen (TYLENOL) tablet 650 mg  650 mg Oral Q4H PRN Herby Abraham, MD      . amLODipine (NORVASC) tablet 10 mg  10 mg Oral Daily Herby Abraham, MD   10 mg at 08/14/12 1021  . aspirin EC tablet 81 mg  81 mg Oral Daily Ok Anis, NP   81 mg at 08/14/12 1021  . atorvastatin (LIPITOR) tablet 40 mg  40 mg Oral q1800 Herby Abraham, MD   40 mg at 08/14/12 1827  . benazepril (LOTENSIN) tablet 40 mg  40 mg Oral Daily Kathleene Hazel, MD      . enoxaparin (LOVENOX) injection 40 mg  40 mg Subcutaneous Q24H Herby Abraham, MD   40 mg at 08/13/12 1939  . metoprolol tartrate (LOPRESSOR) tablet 25 mg  25 mg Oral BID Herby Abraham, MD   25 mg at 08/14/12 2146  . nicotine (NICODERM CQ - dosed in mg/24 hours) patch 21 mg  21 mg Transdermal Daily Herby Abraham, MD   21 mg at 08/14/12 1024  . nitroGLYCERIN (NITROSTAT) SL tablet 0.4 mg  0.4 mg Sublingual Q5 Min x 3 PRN Herby Abraham, MD      . ondansetron [facility]) injection 4 mg  4 mg Intravenous Q6H PRN Herby Abraham, MD      . sodium chloride 0.9 % injection 3 mL  3 mL Intravenous Q12H Herby Abraham, MD   10 mL at 08/14/12 1022  . sodium chloride 0.9 % injection 3 mL  3 mL Intravenous PRN Herby Abraham, MD      . DISCONTD: benazepril (LOTENSIN) tablet 20 mg  20 mg Oral Daily Herby Abraham, MD   20 mg at 08/14/12 1021       Review of Systems:     Cardiac Review of Systems: Y or N  Chest Pain [ y   ]  Resting SOB [ n  ] Exertional SOB  Cove.Etienne  ]  Pollyann Kennedy Milo.Brash  ]   Pedal Edema [  n ]    Palpitations [n  ] Syncope  Milo.Brash  ]   Presyncope [n   ]  General Review of Systems: [Y] = yes [  ]=no Constitional: recent weight change [ n ]; anorexia [n  ]; fatigue Cove.Etienne  ]; nausea [ n ]; night sweats [ n ]; fever [n  ]; or chills [  ];                                                                                                                                          Dental: poor dentition[ n ];   Eye : blurred vision [  ]; diplopia [   ]; vision changes [  ];  Amaurosis fugax[  ]; Resp: cough [  ];  wheezing[  ];  hemoptysis[  ]; shortness of breath[  ]; paroxysmal nocturnal dyspnea[  ]; dyspnea on exertion[  ]; or orthopnea[  ];  GI:  gallstones[  ], vomiting[  ];  dysphagia[  ]; melena[  ];  hematochezia [  ]; heartburn[  ];   Hx of  Colonoscopy[  ]; GU: kidney stones [  ]; hematuria[  ];   dysuria [  ];  nocturia[  ];  history of     obstruction [  ];             Skin: rash, swelling[  ];, hair loss[  ];  peripheral edema[  ];  or itching[  ]; Musculosketetal: myalgias[  ];  joint swelling[  ];  joint erythema[  ];  joint pain[  ];  back pain[  ];  Heme/Lymph: bruising[  ];  bleeding[  ];  anemia[  ];  Neuro: Agathon.Pickerel  ];  headaches[  n  stroke[ n ];  vertigo[n  ];  seizures[  ];   paresthesias[  ];  difficulty walking[  ];  Psych:depression[  ]; anxiety[  ];  Endocrine: diabetes[  ];  thyroid dysfunction[   ];  Immunizations: Flu [ none recorded in epic ]; Pneumococcal[none recorded in epic  ];  Other:  Physical Exam: BP 135/74  Pulse 56  Temp 98.4 F (36.9 C) (Oral)  Resp 18  Ht 5\' 6"  (1.676 m)  Wt 150 lb 14.4 oz (68.448 kg)  BMI 24.36 kg/m2  SpO2 97%  General appearance: alert, cooperative, appears stated age and no distress Neurologic: intact Heart: normal apical impulse and systolic murmur: early systolic 3/6, crescendo at lower left sternal border Lungs: clear to auscultation bilaterally and normal percussion bilaterally Abdomen: soft, non-tender; bowel sounds normal; no masses,  no organomegaly Extremities: extremities normal, atraumatic, no cyanosis or edema, Homans sign is negative, no sign of DVT and vein harvested from rt leg just above knee to ankle Wound: sternum stable well healed No carotid bruits   Diagnostic Studies & Laboratory data:     Recent Radiology Findings:  Dg Chest 2 View  08/09/2012  *RADIOLOGY REPORT*  Clinical Data: Chest pain.  Smoker.  CHEST - 2 VIEW  Comparison: 03/27/2007.  Findings: Post CABG.  No infiltrate, congestive heart failure or pneumothorax.  Heart size within normal limits.  Calcified aorta.  Carotid bifurcation calcifications.  IMPRESSION: No infiltrate or congestive heart failure.  Post CABG with slightly tortuous aorta.   Original Report Authenticated By: Fuller Canada, M.D.     Recent Lab Findings: Lab Results  Component Value Date   WBC 11.3* 08/10/2012   HGB 15.4 08/10/2012   HCT 46.9 08/10/2012   PLT 205 08/10/2012   GLUCOSE 149* 08/10/2012   CHOL 129 08/10/2012   TRIG 88 08/10/2012   HDL 53 08/10/2012   LDLCALC 58 08/10/2012   ALT 23 08/09/2012   AST 26 08/09/2012   NA 139 08/10/2012   K 3.5 08/10/2012   CL 104 08/10/2012   CREATININE 0.71 08/10/2012   BUN 15 08/10/2012   CO2 25 08/10/2012   TSH 1.410 08/09/2012   INR 0.95 08/09/2012   HGBA1C 5.7* 08/09/2012   ECHO: 2013  AVA 1.45, peak aortic velocity 240  cm/sec 12mm, 23 mm gradient, one year ago 227 cm/sec, 13mm, 21mm gradient Patient: Keaton, Beichner MR #: [id number] Study Date: 08/10/2012 Gender: M Age: 38 Height: 167.6cm Weight: 68.2kg BSA: 1.89m^2 Pt. Status: Room: MCCL  ORDERING Shawnie Pons PERFORMING [place], [facility] SONOGRAPHER  Cathie Beams cc:  ------------------------------------------------------------ LV EF: 55% - 60%  ------------------------------------------------------------ Indications: Previous study 06/14/2011. Aortic stenosis 424.1.  ------------------------------------------------------------ History: PMH: Carotid artery disease. History of back pain. Coronary artery disease. Risk factors: Hypertension.  ------------------------------------------------------------ Study Conclusions  - Left ventricle: The cavity size was normal. Wall thickness was normal. Systolic function was normal. The estimated ejection fraction was in the range of 55% to 60%. - Aortic valve: Moderately calcified annulus. There was mild stenosis. Valve area: 1.45cm^2(VTI). Valve area: 1.71cm^2 (Vmax). Valve area: 1.45cm^2 (Vmean). Transthoracic echocardiography. M-mode, complete 2D, spectral Doppler, and color Doppler. Height: Height: 167.6cm. Height: 66in. Weight: Weight: 68.2kg. Weight: 150lb. Body mass index: BMI: 24.3kg/m^2. Body surface area: BSA: 1.33m^2. Blood pressure: 129/108. Patient status: Inpatient. Location: ICU/CCU  ------------------------------------------------------------  ------------------------------------------------------------ Left ventricle: The cavity size was normal. Wall thickness was normal. Systolic function was normal. The estimated ejection fraction was in the range of 55% to 60%.  ------------------------------------------------------------ Aortic valve: Moderately calcified annulus. Doppler: There was mild stenosis. No regurgitation. VTI ratio of LVOT to aortic valve: 0.38. Valve  area: 1.45cm^2(VTI). Indexed valve area: 0.81cm^2/m^2 (VTI). Peak velocity ratio of LVOT to aortic valve: 0.45. Valve area: 1.71cm^2 (Vmax). Indexed valve area: 0.96cm^2/m^2 (Vmax). Mean velocity ratio of LVOT to aortic valve: 0.38. Valve area: 1.45cm^2 (Vmean). Indexed valve area: 0.81cm^2/m^2 (Vmean). Mean gradient: 12mm Hg (S). Peak gradient: 23mm Hg (S).  ------------------------------------------------------------ Aorta: Aortic root: The aortic root was normal in size. Ascending aorta: The ascending aorta was normal in size.  ------------------------------------------------------------ Mitral valve: Structurally normal valve. Leaflet separation was normal. Doppler: Transvalvular velocity was within the normal range. There was no evidence for stenosis. No regurgitation. Peak gradient: 3mm Hg (D).  ------------------------------------------------------------ Left atrium: The atrium was normal in size.  ------------------------------------------------------------ Right ventricle: The cavity size was normal. Wall thickness was normal. Systolic function was normal.  ------------------------------------------------------------ Pulmonic valve: Doppler: No significant regurgitation.  ------------------------------------------------------------ Tricuspid valve: Doppler: No significant regurgitation.  ------------------------------------------------------------ Right atrium: The atrium was normal in size.  ------------------------------------------------------------ Pericardium: There was no pericardial effusion.  ------------------------------------------------------------ Systemic veins: Inferior vena cava: The vessel was normal in size; the respirophasic diameter changes were in the normal range (= 50%); findings are consistent with normal central venous pressure.  ------------------------------------------------------------  2D measurements Normal Doppler measurements  Normal Left ventricle Left ventricle LVID ED, 46 mm 43-52 Ea, med 8.68 cm/s ------ chord, ann, tiss PLAX DP LVID ES, 34.5 mm 23-38 E/Ea, med 9.39 ------ chord, ann, tiss PLAX DP FS, chord, 25 % >29 LVOT PLAX Peak vel, 108 cm/s ------ LVPW, ED 11 mm ------ S IVS/LVPW 1.02 <1.3 Mean vel, 60.6 cm/s ------ ratio, ED S Ventricular septum VTI, S 20.2 cm ------ IVS, ED 11.2 mm ------ Aortic valve LVOT Peak vel, 240 cm/s ------ Diam, S 22 mm ------ S Area 3.8 cm^2 ------ Mean vel, 159 cm/s ------ Aorta S Root diam, 31 mm ------ VTI, S 52.8 cm ------ ED Mean 12 mm Hg ------ Left atrium gradient, AP dim 36 mm ------ S AP dim 2.01 cm/m^2 <2.2 Peak 23 mm Hg ------ index gradient, S VTI ratio 0.38 ------ LVOT/AV Area, VTI 1.45 cm^2 ------ Area index 0.81 cm^2/m ------ (VTI) ^2 Peak vel 0.45 ------ ratio, LVOT/AV Area, Vmax 1.71 cm^2 ------ Area index 0.96 cm^2/m ------ (Vmax) ^2 Vmean 0.38 ------ ratio LVOT/AV Area, 1.45 cm^2 ------ Vmean Area index 0.81 cm^2/m ------ (Vmean) ^2 Regurg 242 cm/s ------ peak vel Regurg PHT 443 ms ------ Regurg 23 mm Hg ------ peak gradient Mitral  valve Peak E vel 81.5 cm/s ------ Peak A vel 91 cm/s ------ Decelerati 282 ms 150-23 on time 0 Peak 3 mm Hg ------ gradient, D Peak E/A 0.9 ------ ratio Right ventricle Sa vel, 10.2 cm/s ------ lat ann, tiss DP  ------------------------------------------------------------ Prepared and Electronically Authenticated by  Kristeen Miss 2013-10-24T16:39:[id number]  CATH: Procedure: Left Heart Cath, Selective Coronary Angiography, SVG angio, SLIMA, LV angiography, aortic root angio  Indication: unstable angina  Procedural details: The right groin was prepped, draped, and anesthetized with 1% lidocaine. Using modified Seldinger technique, a 5 French sheath was introduced into the left femoral artery. Standard Judkins catheters were used for coronary angiography and left ventriculography.  Catheter exchanges were performed over a guidewire. There were no immediate procedural complications. The patient was transferred to the post catheterization recovery area for further monitoring. I then spoke with the family.  Procedural Findings:  Hemodynamics:  AO 151/71 (102)  LV 162/19  Coronary angiography:  Coronary dominance: right  Left mainstem: Heavily calcified. There is diffuse 70% narrowing in the LMCA. It is diseased to the take off of the large OM  Left anterior descending (LAD): Occluded after the septal  Left circumflex (LCx): Diffuse 70-80% segmental disease leading to a very large OM. This collateralizes multiple vessels including the PDA. It also retrograde fills a diagonal or ramus branch  Right coronary artery (RCA): 90% ostial and totally occluded.  SVG to the PDA PLA is occluded  SVG to the OM is occluded.  LIMA to the diagonal and LAD is intact but there is 70-80% narrowing near the LAD insertion. The LAD also collateralizes multiple vessels  Left ventriculography: Left ventricular systolic function is reduced, with inferobasal hypokinesis. EF estimate is 45%-50%.  Aortic root: Calcified leaflets with moderate AI.  Final Conclusions:  1. Occluded SVGs to the OM and RCA system.  2. Segmental LMCA and OM disease supplying large system.  3. Patent IMA with touchdown disease at insertion.  4. Moderate AI, AS      Assessment / Plan:   Unstable anginal symptoms Suspect mild As and AI but discordant  data between cath report  and serial echos (moderate AI,AS on cath report  but  no right heart cath or CI or valve area calculated)  No AI reported on ECHO Valve area by echo 1.45 cm/sq  Lima previously was to prox and distal Lad ( not diagonal)  with bifid lad system. The distal vessels of rt and cir system were poor quality with  per 1993  Op note.  Large om not bypassed previously, but may have had   stent in om and or left main in past, need complete cardiology  records Significant calcium in ascending aorta noted on cath  ? Egg shell , will obtain ct of chest to evaluate ascending aorta Patient still smoking  Suggest, track down previous cardiology procedures, current interventionist in town review percutaneous options and will  review CT before final determination of operability.      Delight Ovens MD  Beeper [phone] Office [phone] 08/14/2012 10:03 PM

## 2012-08-14 NOTE — Progress Notes (Signed)
Patient Name: Steve Durham Date of Encounter: 08/14/2012     Principal Problem:  *Unstable angina Active Problems:  HYPERTENSION  CAD, ARTERY BYPASS GRAFT  CAROTID ARTERY DISEASE  Aortic stenosis  Hyperlipidemia    SUBJECTIVE: Feels well. No cp or SOB.    OBJECTIVE  Filed Vitals:   08/13/12 0517 08/13/12 1347 08/13/12 1920 08/14/12 0446  BP: 147/75 129/71 160/62 133/73  Pulse: 54 59 63 54  Temp: 97.8 F (36.6 C) 98.2 F (36.8 C) 98.1 F (36.7 C) 98.5 F (36.9 C)  TempSrc: Oral Oral Oral Oral  Resp: 20 18 20 18   Height:      Weight: 68.402 kg (150 lb 12.8 oz)   68.448 kg (150 lb 14.4 oz)  SpO2: 96% 98% 98% 97%    Intake/Output Summary (Last 24 hours) at 08/14/12 0840 Last data filed at 08/13/12 1200  Gross per 24 hour  Intake    240 ml  Output      0 ml  Net    240 ml   Weight change: 0.045 kg (1.6 oz)  PHYSICAL EXAM  General: Well developed, well nourished, in no acute distress. Head: Normocephalic, atraumatic, sclera non-icteric, no xanthomas, nares are without discharge.  Neck: Supple without bruits or JVD. Lungs:  Resp regular and unlabored, CTAB without wheezes, rales or rhonchi Heart: RRR, II/VI systolic crescendo-decrescendo murmur at RUSB/LLSB, no s3, s4, Abdomen: Soft, non-tender, non-distended, BS + x 4.  Msk:  Strength and tone appears normal for age. Extremities: No clubbing, cyanosis or edema. DP/PT/Radials 2+ and equal bilaterally. Neuro: Alert and oriented X 3. Moves all extremities spontaneously. Psych: Normal affect.  LABS:  Lab 08/10/12 0124 08/09/12 1742  NA 139 142  K 3.5 4.1  CL 104 104  CO2 25 26  BUN 15 16  CREATININE 0.71 0.85  CALCIUM 8.7 9.2  PROT -- 7.7  BILITOT -- 0.6  ALKPHOS -- 83  ALT -- 23  AST -- 26  AMYLASE -- --  LIPASE -- --  GLUCOSE 149* 109*   TELE: NSR/sinus bradycardia, HR 50-60s  Radiology/Studies:  Dg Chest 2 View  08/09/2012  *RADIOLOGY REPORT*  Clinical Data: Chest pain.  Smoker.   CHEST - 2 VIEW  Comparison: 03/27/2007.  Findings: Post CABG.  No infiltrate, congestive heart failure or pneumothorax.  Heart size within normal limits.  Calcified aorta.  Carotid bifurcation calcifications.  IMPRESSION: No infiltrate or congestive heart failure.  Post CABG with slightly tortuous aorta.   Original Report Authenticated By: Fuller Canada, M.D.     Current Medications:     . amLODipine  10 mg Oral Daily  . aspirin EC  81 mg Oral Daily  . atorvastatin  40 mg Oral q1800  . benazepril  20 mg Oral Daily  . enoxaparin (LOVENOX) injection  40 mg Subcutaneous Q24H  . metoprolol tartrate  25 mg Oral BID  . nicotine  21 mg Transdermal Daily  . sodium chloride  3 mL Intravenous Q12H    ASSESSMENT:  1. CAD/ACS- cath 10/24 revealing occluded SVGs to OM & RCA; segmental LMCA & OM dz, patent IMA w/ insertional dz, moderate AI/AS. Plan TCTS consult. Plan is for Dr. Tyrone Sage to see today to consider redo CABG +/- AVR consideration. Continue ASA/ACEi/BB/ccb/statin.  2. Moderate AS- evidenced by cath and echo. Per TCTS recs.   3. Hyperlipidemia- continue Lipitor. Increase to 80mg .   4. HTN- BP up yesterday. Increase BP control. HR 50s limiting up-titration of BB. CCB  maximized. ACEi optimized to 20mg . Increase to 40mg  today.    5. Tobacco abuse- cessation stressed.   Signed, R. Hurman Horn, PA-C 08/14/2012, 8:40 AM  I have personally seen and examined this patient with Hurman Horn, PA-C. Dr. Riley Kill has formulated a plan for surgical evaluation for redo CABG with possible AVR. I am seeing Steve Durham today since Dr. Riley Kill is out of town. I agree with the assessment and plan as outlined above. Vitals are stable. BP is still elevated. Will increase Lisinopril to 40 mg po Qdaily. No chest pain. Awaiting surgical evaluation.   Steve Durham 1:04 PM 08/14/2012

## 2012-08-15 ENCOUNTER — Inpatient Hospital Stay (HOSPITAL_COMMUNITY): Payer: Medicare Other

## 2012-08-15 ENCOUNTER — Encounter (HOSPITAL_COMMUNITY): Payer: Self-pay | Admitting: Radiology

## 2012-08-15 DIAGNOSIS — I2510 Atherosclerotic heart disease of native coronary artery without angina pectoris: Secondary | ICD-10-CM

## 2012-08-15 LAB — BASIC METABOLIC PANEL
CO2: 28 mEq/L (ref 19–32)
Calcium: 9.1 mg/dL (ref 8.4–10.5)
Chloride: 103 mEq/L (ref 96–112)
Creatinine, Ser: 0.92 mg/dL (ref 0.50–1.35)
GFR calc Af Amer: >90 mL/min (ref >90)
Sodium: 138 mEq/L (ref 135–145)

## 2012-08-15 MED ORDER — NICOTINE 21 MG/24HR TD PT24: MEDICATED_PATCH | TRANSDERMAL | Status: DC

## 2012-08-15 MED ORDER — AMLODIPINE BESY-BENAZEPRIL HCL 5-20 MG PO CAPS: 2.0000 | ORAL_CAPSULE | Freq: Every day | ORAL | Status: DC

## 2012-08-15 MED ORDER — ASPIRIN 81 MG PO TBEC: 81.0000 mg | DELAYED_RELEASE_TABLET | Freq: Every day | ORAL | Status: DC

## 2012-08-15 MED ORDER — NITROGLYCERIN 0.4 MG SL SUBL: 0.4000 mg | SUBLINGUAL_TABLET | SUBLINGUAL | Status: DC | PRN

## 2012-08-15 MED ORDER — ISOSORBIDE MONONITRATE ER 30 MG PO TB24: 30.0000 mg | ORAL_TABLET | Freq: Every day | ORAL | Status: DC

## 2012-08-15 MED ORDER — METOPROLOL TARTRATE 25 MG PO TABS: 25.0000 mg | ORAL_TABLET | Freq: Two times a day (BID) | ORAL | Status: DC

## 2012-08-15 MED ORDER — EZETIMIBE-SIMVASTATIN 10-40 MG PO TABS: 1.0000 | ORAL_TABLET | Freq: Every day | ORAL | Status: DC

## 2012-08-15 NOTE — Progress Notes (Signed)
Patient ID: Steve Durham, male   DOB: 01-17-1946, 66 y.o.   MRN: [id number]                    [address]            [place] [address]          [phone]     5 Days Post-Op Procedure(s) (LRB): LEFT HEART CATHETERIZATION WITH CORONARY/GRAFT ANGIOGRAM (N/A)  Subjective: Feels well no angina since admission, CT of chest shows extensive Ca in acending aorta  Objective: Vital signs in last 24 hours: Patient Vitals for the past 24 hrs:  BP Temp Temp src Pulse Resp SpO2  08/15/12 1321 133/68 mmHg 97.2 F (36.2 C) Oral 50  18  99 %  08/15/12 1009 - - - 60  - -  08/15/12 1006 147/73 mmHg - - - - -  08/15/12 0700 134/73 mmHg 98.5 F (36.9 C) Oral 54  18  99 %  08/14/12 2208 145/66 mmHg 98.6 F (37 C) Oral 57  20  96 %   Current Weight  08/14/12 150 lb 14.4 oz (68.448 kg)     Intake/Output from previous day: 10/28 0701 - 10/29 0700 In: 840 [P.O.:840] Out: -     PHYSICAL EXAM:  Heart: nl Lungs: no wheezing Wound: no edema No wheezing     Lab Results: CBC:No results found for this basename: WBC:2,HGB:2,HCT:2,PLT:2 in the last 72 hours BMET:  [facility] 08/15/12 0901  NA 138  K 3.7  CL 103  CO2 28  GLUCOSE 98  BUN 19  CREATININE 0.92  CALCIUM 9.1    PT/INR: No results found for this basename: LABPROT,INR in the last 72 hours  Ct Chest Without Contrast  08/15/2012  *RADIOLOGY REPORT*  Clinical Data: Chest pain.  Evaluate ascending aorta.  CT CHEST WITHOUT CONTRAST  Technique:  Multidetector CT imaging of the chest was performed following the standard protocol without IV contrast.  Comparison: None  Findings: The chest wall is unremarkable.  No supraclavicular or axillary mass or adenopathy.  The thyroid gland appears normal. The bony thorax is intact.  Surgical changes are noted from bypass surgery with sternotomy wires.  No destructive bone lesions or spinal canal compromise.  Moderate degenerative changes involving the thoracic spine.  The  heart is normal in size.  No pericardial effusion.  Small scattered mediastinal and hilar lymph nodes but no mass or adenopathy.  The esophagus is grossly normal.  There is tortuosity, mild ectasia and dense atherosclerotic calcifications involving the ascending aorta.  No aneurysmal dilatation.  There are also dense atherosclerotic calcifications involving the coronary arteries. Mild atherosclerotic calcification involving the aortic branch vessels.  Examination of the lung parenchyma demonstrates no acute pulmonary findings.  Minimal emphysematous changes at the lung apices.  No worrisome pulmonary mass lesions or nodules.  Minimal basilar scarring changes.  The upper abdomen demonstrates multiple bilateral simple and hyperdense/hemorrhagic renal cyst.  Small gallstones are noted in the gallbladder.  There are dense aortic and branch vessel atherosclerotic calcifications.  IMPRESSION:  1.  Advanced atherosclerotic calcification involving the thoracic and abdominal aorta.  No focal aneurysm. 2.  Advanced atherosclerotic calcifications involving the coronary arteries and aortic branch vessels. 3.  No mediastinal or hilar mass or adenopathy.  For no acute pulmonary findings.  Minimal basilar scarring changes. 4.  Multiple simple and hyperdense/hemorrhagic renal cysts. 5.  Cholelithiasis.   Original Report Authenticated By: P. Loralie Champagne, M.D.  Assessment/Plan: S/P Procedure(s) (LRB): LEFT HEART CATHETERIZATION WITH CORONARY/GRAFT ANGIOGRAM (N/A) Discussed case with Cardiology. Dr Excell Seltzer and [facility] have reviewed films and have decided on medical Rx for now. Valve disease is not severe enough to warrant Valve replacement.    LOS: 6 days    Diera Wirkkala B 08/15/2012

## 2012-08-15 NOTE — Progress Notes (Signed)
SUBJECTIVE: No recurrent chest pain. He feels well. No SOB.   BP 133/68  Pulse 50  Temp 97.2 F (36.2 C) (Oral)  Resp 18  Ht 5\' 6"  (1.676 m)  Wt 150 lb 14.4 oz (68.448 kg)  BMI 24.36 kg/m2  SpO2 99%  Intake/Output Summary (Last 24 hours) at 08/15/12 1417 Last data filed at 08/15/12 1230  Gross per 24 hour  Intake    720 ml  Output      0 ml  Net    720 ml    PHYSICAL EXAM General: Well developed, well nourished, in no acute distress. Alert and oriented x 3.  Psych:  Good affect, responds appropriately Neck: No JVD. No masses noted.  Lungs: Clear bilaterally with no wheezes or rhonci noted.  Heart: RRR with systolic murmur.  Abdomen: Bowel sounds are present. Soft, non-tender.  Extremities: No lower extremity edema.   LABS: Basic Metabolic Panel:  Basename 08/15/12 0901  NA 138  K 3.7  CL 103  CO2 28  GLUCOSE 98  BUN 19  CREATININE 0.92  CALCIUM 9.1  MG --  PHOS --   Current Meds:    . amLODipine  10 mg Oral Daily  . aspirin EC  81 mg Oral Daily  . atorvastatin  40 mg Oral q1800  . benazepril  40 mg Oral Daily  . enoxaparin (LOVENOX) injection  40 mg Subcutaneous Q24H  . metoprolol tartrate  25 mg Oral BID  . nicotine  21 mg Transdermal Daily  . sodium chloride  3 mL Intravenous Q12H   CTA chest: 08/14/12:  1. Advanced atherosclerotic calcification involving the thoracic  and abdominal aorta. No focal aneurysm.  2. Advanced atherosclerotic calcifications involving the coronary  arteries and aortic branch vessels.  3. No mediastinal or hilar mass or adenopathy. For no acute  pulmonary findings. Minimal basilar scarring changes.  4. Multiple simple and hyperdense/hemorrhagic renal cysts.  5. Cholelithiasis.   ASSESSMENT AND PLAN:  1. CAD: Admitted with chest pain. Negative cardiac markers. No recurrent pain over last 5 days. Cath per Dr. Riley Kill 10/24 revealing occluded SVGs to OM & RCA; segmental LMCA & OM dz, patent IMA w/ insertional dz,  moderate AI/AS. Dr. Riley Kill planned  for surgical consultation for redo CABG with possible AVR. Dr. Tyrone Sage has seen the patient and feels that redo CABG would be high risk given his severe aortic arch calcification and the fact that his distal targets were felt to be poor in the RCA and Circumflex at the time of his first CABG.  Continue ASA/ACEi/BB/ccb/statin. Will add Imdur 30 mg po Qdaily. I have reviewed his films with our interventional team today and since none of the lesions are critical and he is asymptomatic, will discharge today and try medical therapy. I have discussed this plan with Dr. Tyrone Sage. If he has recurrent chest pain c/w unstable angina, would have to consider PTCA of the distal left main extending down through the proximal and mid Circumflex/OM. The patient is asking to go home today.   2. Moderate AS- Not felt to be significant per Dr. Tyrone Sage. Will follow.   3. Hyperlipidemia- Continue Lipitor 80 mg po QHS.   4. HTN- BP controlled.   5. Tobacco abuse- Cessation has been recommended.   6. Dispo: d/c home today. He will need prescriptions for all cardiac medications today. We have added Imdur. Follow up in office in 2 weeks. He will call if there are any concerns with recurrent chest pain  or SOB.      Iman Reinertsen  10/29/20132:17 PM

## 2012-08-15 NOTE — Discharge Summary (Signed)
Discharge Summary   Patient ID: Steve Durham MRN: [id number], DOB/AGE: 68-Aug-1947 68 y.o. Admit date: 08/09/2012 D/C date:     08/15/2012  Primary Cardiologist: Riley Kill  Primary Discharge Diagnoses:  1. CAD - cath 10/24 demonstrating occluded SVGs to OM & RCA; segmental LMCA & OM dz, patent IMA w/ insertional dz, moderate AI/AS -- for med rx for now - CABG 10/1991 2. Aortic valve disease with mod AS/AI by cath this admission 3. HTN 4. Tobacco abuse 5. Hyperlipidemia 6. Multiple simple and hyperdense/hemorrhagic cysts on CT this admission 7. Cholelithiasis by CT this admission  Secondary Discharge Diagnoses:  1. Hyperlipoproteinemia 2. Carotid artery disease - most recent dopplers 08/11/12: no significant ICA stenosis 3. Back pain   Hospital Course: Steve Durham is a 66 y/o M with hx of CAD s/p CABG 1993, HTN, HL was seen in the office 08/09/12 for CP. He was there for his annual visit but while in the waiting room developed onset of severe substernal CP and diaphoresis. He was brought back and given 2 SL NTG with total resolution of his pain. EKG showed some dynamic ST depression but this resolved. Due to concern for [place], he was sent to [facility] for admission, IV heparin and plan for cardiac cath. He underwent this procedure on 10/24 demonstrating: 1. Occluded SVGs to the OM and RCA system.  2. Segmental LMCA and OM disease supplying large system.  3. Patent IMA with touchdown disease at insertion.  4. Moderate AI, AS F/u echo demonstrated mild AS. Initial plan was for TCTS consult for consideration of re-do CABG. He was seen by Dr. Tyrone Sage yesterday who reviewed his prior op report [facility] sequentially proximal and distal LAD, vein distal circumflex, vein to pd and pl. The note at that time specific notes poor quality targets for redo bypass). He had significant calcium in his ascending aorta. Ultimately Dr. Tyrone Sage felt that redo CABG would be high risk given his severe  aortic arch calcification and the fact that his distal targets were felt to be poor in the RCA and circumflex at the time of his first CABG. The patient has since ambulated and felt well without chest pain. Dr. Clifton James has reviewed his films with our interventional team today and since none of the lesions are critical and he is asymptomatic, he will be initiated on medical therapy for now. Dr. Clifton James has seen and examined him and feels he is stable for discharge. He notes that if the patient has recurrent chest pain c/w unstable angina, would have to consider PTCA of the distal left main extending down through the proximal and mid Circumflex/OM. Imdur was added and BB has since been increased.   Of note, CT scan incidentally noted cholelithasis as well as some renal cysts - see report below. I personally discussed the results of this CT scan with the patient and instructed him to discuss with PCP regarding possible further workup or monitoring.  Discharge Vitals: Blood pressure 133/68, pulse 50, temperature 97.2 F (36.2 C), temperature source Oral, resp. rate 18, height 5\' 6"  (1.676 m), weight 150 lb 14.4 oz (68.448 kg), SpO2 99.00%.  Labs: Lab Results  Component Value Date   WBC 11.3* 08/10/2012   HGB 15.4 08/10/2012   HCT 46.9 08/10/2012   MCV 97.5 08/10/2012   PLT 205 08/10/2012     Lab 08/15/12 0901 08/09/12 1742  NA 138 --  K 3.7 --  CL 103 --  CO2 28 --  BUN 19 --  CREATININE 0.92 --  CALCIUM 9.1 --  PROT -- 7.7  BILITOT -- 0.6  ALKPHOS -- 83  ALT -- 23  AST -- 26  GLUCOSE 98 --   No results found for this basename: CKTOTAL:4,CKMB:4,TROPONINI:4 in the last 72 hours Lab Results  Component Value Date   CHOL 129 08/10/2012   HDL 53 08/10/2012   LDLCALC 58 08/10/2012   TRIG 88 08/10/2012   No results found for this basename: DDIMER    Diagnostic Studies/Procedures   1. Chest 2 View 08/09/2012  *RADIOLOGY REPORT*  Clinical Data: Chest pain.  Smoker.  CHEST - 2 VIEW   Comparison: 03/27/2007.  Findings: Post CABG.  No infiltrate, congestive heart failure or pneumothorax.  Heart size within normal limits.  Calcified aorta.  Carotid bifurcation calcifications.  IMPRESSION: No infiltrate or congestive heart failure.  Post CABG with slightly tortuous aorta.   Original Report Authenticated By: Fuller Canada, M.D.    2. Ct Chest Without Contrast 08/15/2012  *RADIOLOGY REPORT*  Clinical Data: Chest pain.  Evaluate ascending aorta.  CT CHEST WITHOUT CONTRAST  Technique:  Multidetector CT imaging of the chest was performed following the standard protocol without IV contrast.  Comparison: None  Findings: The chest wall is unremarkable.  No supraclavicular or axillary mass or adenopathy.  The thyroid gland appears normal. The bony thorax is intact.  Surgical changes are noted from bypass surgery with sternotomy wires.  No destructive bone lesions or spinal canal compromise.  Moderate degenerative changes involving the thoracic spine.  The heart is normal in size.  No pericardial effusion.  Small scattered mediastinal and hilar lymph nodes but no mass or adenopathy.  The esophagus is grossly normal.  There is tortuosity, mild ectasia and dense atherosclerotic calcifications involving the ascending aorta.  No aneurysmal dilatation.  There are also dense atherosclerotic calcifications involving the coronary arteries. Mild atherosclerotic calcification involving the aortic branch vessels.  Examination of the lung parenchyma demonstrates no acute pulmonary findings.  Minimal emphysematous changes at the lung apices.  No worrisome pulmonary mass lesions or nodules.  Minimal basilar scarring changes.  The upper abdomen demonstrates multiple bilateral simple and hyperdense/hemorrhagic renal cyst.  Small gallstones are noted in the gallbladder.  There are dense aortic and branch vessel atherosclerotic calcifications.  IMPRESSION:  1.  Advanced atherosclerotic calcification involving the thoracic  and abdominal aorta.  No focal aneurysm. 2.  Advanced atherosclerotic calcifications involving the coronary arteries and aortic branch vessels. 3.  No mediastinal or hilar mass or adenopathy.  For no acute pulmonary findings.  Minimal basilar scarring changes. 4.  Multiple simple and hyperdense/hemorrhagic renal cysts. 5.  Cholelithiasis.   Original Report Authenticated By: P. Loralie Champagne, M.D.    2D Echo 08/10/12 Study Conclusions - Left ventricle: The cavity size was normal. Wall thickness was normal. Systolic function was normal. The estimated ejection fraction was in the range of 55% to 60%. - Aortic valve: Moderately calcified annulus. There was mild stenosis. Valve area: 1.45cm^2(VTI). Valve area: 1.71cm^2 (Vmax). Valve area: 1.45cm^2 (Vmean).  Discharge Medications   Current Discharge Medication List    START taking these medications   Details  isosorbide mononitrate (IMDUR) 30 MG 24 hr tablet Take 1 tablet (30 mg total) by mouth daily. Qty: 30 tablet, Refills: 6      CONTINUE these medications which have CHANGED   Details  amLODipine-benazepril (LOTREL) 5-20 MG per capsule Take 2 capsules by mouth daily. Qty: 60 capsule, Refills: 6    aspirin EC  81 MG EC tablet Take 1 tablet (81 mg total) by mouth daily.    ezetimibe-simvastatin (VYTORIN) 10-40 MG per tablet Take 1 tablet by mouth at bedtime. Qty: 30 tablet, Refills: 6    metoprolol tartrate (LOPRESSOR) 25 MG tablet Take 1 tablet (25 mg total) by mouth 2 (two) times daily. Qty: 60 tablet, Refills: 6    nitroGLYCERIN (NITROSTAT) 0.4 MG SL tablet Place 1 tablet (0.4 mg total) under the tongue every 5 (five) minutes as needed for chest pain (up to 3 doses). Qty: 25 tablet, Refills: 4      CONTINUE these medications which have NOT CHANGED   Details  fexofenadine (ALLEGRA) 180 MG tablet Take 180 mg by mouth daily as needed. For allergies    Omega-3 Fatty Acids (FISH OIL) 1000 MG CAPS Take 1,000 mg by mouth 2 (two) times  daily.      omeprazole (PRILOSEC) 20 MG capsule Take 20 mg by mouth as needed. For acid reflux        Disposition   The patient will be discharged in stable condition to home. Discharge Orders    Future Orders Please Complete By Expires   Diet - low sodium heart healthy      Increase activity slowly      Comments:   No lifting over 5 lbs for 3 days. No sexual activity for 3 days. Keep procedure site clean & dry. If you notice increased pain, swelling, bleeding or pus, call/return!  You may shower, but no soaking baths/hot tubs/pools for 1 week.     Follow-up Information    Follow up with Shawnie Pons, MD. (Our office will call you for an appointment)    Contact information:   [address] [place] [address] [phone]       Follow up with Primary Care Doctor. (Your chest CT this admission incidentally showed small kidney cysts and gallstones. Your kidney function is normal, but this may require periodic monitoring - please discuss with your primary doctor.)            Duration of Discharge Encounter: Greater than 30 minutes including physician and PA time.  Signed, Ronie Spies PA-C 08/15/2012, 4:33 PM

## 2012-08-16 NOTE — Care Management Note (Signed)
    Page 1 of 1   08/16/2012     10:26:58 AM   CARE MANAGEMENT NOTE 08/16/2012  Patient:  Steve Durham, Steve Durham   Account Number:  [id number]  Date Initiated:  08/09/2012  Documentation initiated by:  Junius Creamer  Subjective/Objective Assessment:   adm w ch pain     Action/Plan:   lives w wife   Anticipated DC Date:  08/16/2012   Anticipated DC Plan:  HOME/SELF CARE      DC Planning Services  CM consult      Choice offered to / List presented to:             Status of service:  Completed, signed off Medicare Important Message given?   (If response is "NO", the following Medicare IM given date fields will be blank) Date Medicare IM given:   Date Additional Medicare IM given:    Discharge Disposition:  HOME/SELF CARE  Per UR Regulation:  Reviewed for med. necessity/level of care/duration of stay  If discussed at Long Length of Stay Meetings, dates discussed:    Comments:  08/15/12 Linda Biehn,RN,BSN [phone] PT S/P CATH; HX CABG, NOW WITH OCCLUDED GRAFTS.  TCTS CONSULT COMPLETE--FINAL PLAN YET TO BE DETERMINED.  WILL FOLLOW.  10/23 11:53a debbie dowell rn,bsn [phone]

## 2012-08-16 NOTE — Discharge Summary (Signed)
See full note from yesterday. cdm

## 2012-08-30 ENCOUNTER — Encounter: Payer: Self-pay | Admitting: Cardiology

## 2012-08-30 ENCOUNTER — Ambulatory Visit (INDEPENDENT_AMBULATORY_CARE_PROVIDER_SITE_OTHER): Payer: Medicare Other | Admitting: Cardiology

## 2012-08-30 VITALS — BP 110/62 | HR 52 | Ht 66.0 in | Wt 158.0 lb

## 2012-08-30 DIAGNOSIS — I359 Nonrheumatic aortic valve disorder, unspecified: Secondary | ICD-10-CM | POA: Diagnosis not present

## 2012-08-30 DIAGNOSIS — E785 Hyperlipidemia, unspecified: Secondary | ICD-10-CM

## 2012-08-30 DIAGNOSIS — I25810 Atherosclerosis of coronary artery bypass graft(s) without angina pectoris: Secondary | ICD-10-CM

## 2012-08-30 DIAGNOSIS — I350 Nonrheumatic aortic (valve) stenosis: Secondary | ICD-10-CM

## 2012-08-30 NOTE — Patient Instructions (Addendum)
Your physician recommends that you schedule a follow-up appointment in: 2 MONTHS  Your physician has recommended you make the following change in your medication: DECREASE Isosorbide MN to 30mg  take one-half tablet by mouth daily

## 2012-09-17 NOTE — Assessment & Plan Note (Signed)
See echo report.  The valve is moderate but not severe at this point.  Continued observation.

## 2012-09-17 NOTE — Progress Notes (Signed)
HPI:  The patient returns today in followup. He was having a fair amount of angina, and was admitted to hospital. He underwent repeat cardiac catheterization. The case was subsequently discussed between Dr. Clifton James, and Dr. Tyrone Sage, and the general recommendation was continued medical therapy would be most appropriate. Please see the surgical consultation note in the chart. Since discharge from the hospital he is actually got along reasonably well. He is stop smoking. Patient does have underlying aortic stenosis, but was felt not severe enough toward primary operation, it was felt that a redo operation might not be of benefit at this time. He actually is doing quite a bit better.  Current Outpatient Prescriptions  Medication Sig Dispense Refill  . amLODipine-benazepril (LOTREL) 5-20 MG per capsule Take 2 capsules by mouth daily.  60 capsule  6  . aspirin EC 81 MG EC tablet Take 1 tablet (81 mg total) by mouth daily.      Marland Kitchen ezetimibe-simvastatin (VYTORIN) 10-40 MG per tablet Take 1 tablet by mouth at bedtime.  30 tablet  6  . fexofenadine (ALLEGRA) 180 MG tablet Take 180 mg by mouth daily as needed. For allergies      . isosorbide mononitrate (IMDUR) 30 MG 24 hr tablet Take 0.5 tablets (15 mg total) by mouth daily.  1 tablet  0  . metoprolol tartrate (LOPRESSOR) 25 MG tablet Take 1 tablet (25 mg total) by mouth 2 (two) times daily.  60 tablet  6  . nicotine (NICODERM CQ - DOSED IN MG/24 HOURS) 21 mg/24hr patch 21mg  patch one daily for 6 weeks then 14mg  patch one daily for 2 weeks  then 7mg  patch one daily for 2 weeks. Remove old patch before applying new one.      . nitroGLYCERIN (NITROSTAT) 0.4 MG SL tablet Place 1 tablet (0.4 mg total) under the tongue every 5 (five) minutes as needed for chest pain (up to 3 doses).  25 tablet  4  . Omega-3 Fatty Acids (FISH OIL) 1000 MG CAPS Take 1,000 mg by mouth 2 (two) times daily.        Marland Kitchen omeprazole (PRILOSEC) 20 MG capsule Take 20 mg by mouth as needed.  For acid reflux        No Known Allergies  Past Medical History  Diagnosis Date  . Coronary artery disease     a. CABG 10/1991. b. cath 10/24 demonstrating occluded SVGs to OM & RCA; segmental LMCA & OM dz, patent IMA w/ insertional dz, moderate AI/AS -- for med rx for now.  . Hypertension   . Hyperlipoproteinemia   . Aortic valve disorders     a. Mod AS/AI by cath 07/2012.  . Back pain   . Carotid artery occlusion     a. Dopplers 07/2012: no significant high grade obstruction.  . Hyperlipidemia   . Abnormal CT scan, kidney     07/2012 - multiple small cysts  . Cholelithiasis     Seen on CT 07/2012    Past Surgical History  Procedure Date  . Coronary artery bypass graft 04/03/99  . Cardiac catheterization 04/03/99  . Carpal tunnel release     Excision mass dorsal left wrist  . Hernia repair 1988    Family History  Problem Relation Age of Onset  . Heart attack Mother     MI  . Hypertension Sister   . Emphysema Brother     History   Social History  . Marital Status: Married    Spouse Name: N/A  Number of Children: 1  . Years of Education: N/A   Occupational History  . Plumbing     Retired in 2007   Social History Main Topics  . Smoking status: Current Some Day Smoker -- 50 years  . Smokeless tobacco: Not on file     Comment: has cut back   . Alcohol Use: 8.4 oz/week    14 Cans of beer per week  . Drug Use: No  . Sexually Active: Not on file   Other Topics Concern  . Not on file   Social History Narrative  . No narrative on file    ROS: Please see the HPI.  All other systems reviewed and negative.  PHYSICAL EXAM:  BP 110/62  Pulse 52  Ht 5\' 6"  (1.676 m)  Wt 158 lb (71.668 kg)  BMI 25.50 kg/m2  General: Well developed, well nourished, in no acute distress. Head:  Normocephalic and atraumatic. Neck: no JVD Lungs: Decrease BS and some prolonged expiration.   Heart: Normal S1 and preserved. S2.  SEM 1-2/6 with no DM>   Pulses: Pulses normal  in all 4 extremities. Extremities: No clubbing or cyanosis. No edema. Neurologic: Alert and oriented x 3.  EKG:  ASSESSMENT AND PLAN:

## 2012-09-17 NOTE — Assessment & Plan Note (Signed)
Recent LDL is at target.

## 2012-09-17 NOTE — Assessment & Plan Note (Signed)
The patient's symptoms are currently improved. The obtuse marginal branch has moderately severe disease, but it is over a very long area that would require multiple long stents. He also has heavy calcification of the aorta by CT, and this makes him less than ideal for repeat operation. He currently is symptomatically improved, and this is important. He is ceased from smoking, and this is helped his current situation. We will continue to follow him closely. He approaches well-documented in epic. With regard to the aortic valve, he clearly has aortic valve disease, but does not approaching that which would require surgery.  We will continue to monitor him medically.

## 2012-10-30 ENCOUNTER — Ambulatory Visit: Payer: Medicare Other | Admitting: Cardiology

## 2012-11-07 ENCOUNTER — Encounter: Payer: Self-pay | Admitting: Cardiology

## 2012-11-07 ENCOUNTER — Ambulatory Visit (INDEPENDENT_AMBULATORY_CARE_PROVIDER_SITE_OTHER): Payer: Medicare Other | Admitting: Cardiology

## 2012-11-07 VITALS — BP 154/75 | HR 50 | Ht 65.0 in | Wt 156.0 lb

## 2012-11-07 DIAGNOSIS — E785 Hyperlipidemia, unspecified: Secondary | ICD-10-CM

## 2012-11-07 DIAGNOSIS — I2510 Atherosclerotic heart disease of native coronary artery without angina pectoris: Secondary | ICD-10-CM

## 2012-11-07 DIAGNOSIS — I25810 Atherosclerosis of coronary artery bypass graft(s) without angina pectoris: Secondary | ICD-10-CM

## 2012-11-07 DIAGNOSIS — I359 Nonrheumatic aortic valve disorder, unspecified: Secondary | ICD-10-CM | POA: Diagnosis not present

## 2012-11-07 DIAGNOSIS — I350 Nonrheumatic aortic (valve) stenosis: Secondary | ICD-10-CM

## 2012-11-07 NOTE — Assessment & Plan Note (Signed)
Moderately severe, but does not require replacement.

## 2012-11-07 NOTE — Patient Instructions (Addendum)
Your physician recommends that you schedule a follow-up appointment at the end of March with Dr. Riley Kill.

## 2012-11-07 NOTE — Assessment & Plan Note (Addendum)
Drug interaction reviewed in detail.  No new symptoms.  No myalgias.  He does not want to change.  He is aware of the potential issue.  One option might be to reduce combo to 20/10 given the likelihood of elevated levels, and the new guidelines.  He wants to continue as he is for now.

## 2012-11-07 NOTE — Progress Notes (Signed)
HPI:  The patient returns in followup today. He really is doing quite well. He's not having much in the way of angina. Blood pressure went up, and he increased again his dose of isosorbide to 30 mg daily. He and I also had an extensive discussion today regarding his use of amlodipine and simvastatin. I went over in detail with him the information available with regard to the drug drug interaction. However, he had to get special approval to take the Vytorin, and this required quite a bit of work. Has worked effectively for the patient over a long period of time, and is not too excited about changing. He's never had symptoms of myalgias, nor other evidence of drug interaction.  He truly believes he does not want to make changes at the present time since he has not had problems in the past  Current Outpatient Prescriptions  Medication Sig Dispense Refill  . amLODipine-benazepril (LOTREL) 5-20 MG per capsule Take 2 capsules by mouth daily.  60 capsule  6  . aspirin EC 81 MG EC tablet Take 1 tablet (81 mg total) by mouth daily.      Marland Kitchen ezetimibe-simvastatin (VYTORIN) 10-40 MG per tablet Take 1 tablet by mouth at bedtime.  30 tablet  6  . fexofenadine (ALLEGRA) 180 MG tablet Take 180 mg by mouth daily as needed. For allergies      . isosorbide mononitrate (IMDUR) 30 MG 24 hr tablet Take 30 mg by mouth daily.   1 tablet  0  . metoprolol tartrate (LOPRESSOR) 25 MG tablet Take 1 tablet (25 mg total) by mouth 2 (two) times daily.  60 tablet  6  . nicotine (NICODERM CQ - DOSED IN MG/24 HOURS) 21 mg/24hr patch 21mg  patch one daily for 6 weeks then 14mg  patch one daily for 2 weeks  then 7mg  patch one daily for 2 weeks. Remove old patch before applying new one.      . nitroGLYCERIN (NITROSTAT) 0.4 MG SL tablet Place 1 tablet (0.4 mg total) under the tongue every 5 (five) minutes as needed for chest pain (up to 3 doses).  25 tablet  4  . Omega-3 Fatty Acids (FISH OIL) 1000 MG CAPS Take 1,000 mg by mouth 2 (two)  times daily.        Marland Kitchen omeprazole (PRILOSEC) 20 MG capsule Take 20 mg by mouth as needed. For acid reflux        No Known Allergies  Past Medical History  Diagnosis Date  . Coronary artery disease     a. CABG 10/1991. b. cath 10/24 demonstrating occluded SVGs to OM & RCA; segmental LMCA & OM dz, patent IMA w/ insertional dz, moderate AI/AS -- for med rx for now.  . Hypertension   . Hyperlipoproteinemia   . Aortic valve disorders     a. Mod AS/AI by cath 07/2012.  . Back pain   . Carotid artery occlusion     a. Dopplers 07/2012: no significant high grade obstruction.  . Hyperlipidemia   . Abnormal CT scan, kidney     07/2012 - multiple small cysts  . Cholelithiasis     Seen on CT 07/2012    Past Surgical History  Procedure Date  . Coronary artery bypass graft 04/03/99  . Cardiac catheterization 04/03/99  . Carpal tunnel release     Excision mass dorsal left wrist  . Hernia repair 1988    Family History  Problem Relation Age of Onset  . Heart attack Mother  MI  . Hypertension Sister   . Emphysema Brother     History   Social History  . Marital Status: Married    Spouse Name: N/A    Number of Children: 1  . Years of Education: N/A   Occupational History  . Plumbing     Retired in 2007   Social History Main Topics  . Smoking status: Current Some Day Smoker -- 50 years  . Smokeless tobacco: Not on file     Comment: has cut back   . Alcohol Use: 8.4 oz/week    14 Cans of beer per week  . Drug Use: No  . Sexually Active: Not on file   Other Topics Concern  . Not on file   Social History Narrative  . No narrative on file    ROS: Please see the HPI.  All other systems reviewed and negative.  PHYSICAL EXAM:  BP 154/75  Pulse 50  Ht 5\' 5"  (1.651 m)  Wt 156 lb (70.761 kg)  BMI 25.96 kg/m2  SpO2 97%  General: Well developed, well nourished, in no acute distress. Head:  Normocephalic and atraumatic. Neck: no JVD Lungs: Clear to auscultation and  percussion. Heart: Normal S1 and S2.  3/6 SEM.  No DM.    Abdomen:  Normal bowel sounds; soft; non tender; no organomegaly Pulses: Pulses normal in all 4 extremities. Extremities: No clubbing or cyanosis. No edema. Neurologic: Alert and oriented x 3.  EKG: NSR.  Nonspecific ST and T change  ASSESSMENT AND PLAN:

## 2012-11-07 NOTE — Assessment & Plan Note (Signed)
See cath information, and his symptoms are much improved. He remains on medical therapy

## 2013-01-08 DIAGNOSIS — M6530 Trigger finger, unspecified finger: Secondary | ICD-10-CM | POA: Diagnosis not present

## 2013-01-12 ENCOUNTER — Ambulatory Visit: Payer: Medicare Other | Admitting: Cardiology

## 2013-01-16 ENCOUNTER — Ambulatory Visit: Payer: Medicare Other | Admitting: Cardiology

## 2013-01-24 DIAGNOSIS — M720 Palmar fascial fibromatosis [Dupuytren]: Secondary | ICD-10-CM | POA: Diagnosis not present

## 2013-01-30 ENCOUNTER — Encounter: Payer: Self-pay | Admitting: Cardiology

## 2013-01-30 ENCOUNTER — Ambulatory Visit (INDEPENDENT_AMBULATORY_CARE_PROVIDER_SITE_OTHER): Payer: Medicare Other | Admitting: Cardiology

## 2013-01-30 VITALS — BP 140/60 | HR 54 | Ht 65.0 in | Wt 158.0 lb

## 2013-01-30 DIAGNOSIS — I2510 Atherosclerotic heart disease of native coronary artery without angina pectoris: Secondary | ICD-10-CM | POA: Diagnosis not present

## 2013-01-30 DIAGNOSIS — I359 Nonrheumatic aortic valve disorder, unspecified: Secondary | ICD-10-CM

## 2013-01-30 DIAGNOSIS — I350 Nonrheumatic aortic (valve) stenosis: Secondary | ICD-10-CM

## 2013-01-30 DIAGNOSIS — I25810 Atherosclerosis of coronary artery bypass graft(s) without angina pectoris: Secondary | ICD-10-CM

## 2013-01-30 DIAGNOSIS — E785 Hyperlipidemia, unspecified: Secondary | ICD-10-CM

## 2013-01-30 NOTE — Patient Instructions (Addendum)
Your physician recommends that you schedule a follow-up appointment in: 2-3 MONTHS with Dr McAlhany (previous pt of Dr Stuckey)  Your physician recommends that you continue on your current medications as directed. Please refer to the Current Medication list given to you today.  

## 2013-01-30 NOTE — Progress Notes (Signed)
HPI:  The patient is in for followup visit. He continues to remain stable. He says he is not having any symptoms whatsoever at the present time.  He is still smoking some, although this is less. I reviewed carefully with him his prior findings, we have also talked about the fact that he eventually will need to have his aortic valve replaced. He previously been seen by cardiac surgery, and felt not to be a good cardiac surgery candidate, although if he has progressive symptoms this could be reconsidered.  The  biggest problem he notes a stress at home with his family. He did not on any of the details associated with this, only noting that this was a particular concern for him.    Current Outpatient Prescriptions  Medication Sig Dispense Refill  . amLODipine-benazepril (LOTREL) 5-20 MG per capsule Take 2 capsules by mouth daily.  60 capsule  6  . aspirin EC 81 MG EC tablet Take 1 tablet (81 mg total) by mouth daily.      Marland Kitchen ezetimibe-simvastatin (VYTORIN) 10-40 MG per tablet Take 1 tablet by mouth at bedtime.  30 tablet  6  . fexofenadine (ALLEGRA) 180 MG tablet Take 180 mg by mouth daily as needed. For allergies      . isosorbide mononitrate (IMDUR) 30 MG 24 hr tablet Take 30 mg by mouth daily.   1 tablet  0  . metoprolol tartrate (LOPRESSOR) 25 MG tablet Take 1 tablet (25 mg total) by mouth 2 (two) times daily.  60 tablet  6  . nicotine (NICODERM CQ - DOSED IN MG/24 HOURS) 21 mg/24hr patch 21mg  patch one daily for 6 weeks then 14mg  patch one daily for 2 weeks  then 7mg  patch one daily for 2 weeks. Remove old patch before applying new one.      . nitroGLYCERIN (NITROSTAT) 0.4 MG SL tablet Place 1 tablet (0.4 mg total) under the tongue every 5 (five) minutes as needed for chest pain (up to 3 doses).  25 tablet  4  . Omega-3 Fatty Acids (FISH OIL) 1000 MG CAPS Take 1,000 mg by mouth 2 (two) times daily.        Marland Kitchen omeprazole (PRILOSEC) 20 MG capsule Take 20 mg by mouth as needed. For acid reflux       No  current facility-administered medications for this visit.    No Known Allergies  Past Medical History  Diagnosis Date  . Coronary artery disease     a. CABG 10/1991. b. cath 10/24 demonstrating occluded SVGs to OM & RCA; segmental LMCA & OM dz, patent IMA w/ insertional dz, moderate AI/AS -- for med rx for now.  . Hypertension   . Hyperlipoproteinemia   . Aortic valve disorders     a. Mod AS/AI by cath 07/2012.  . Back pain   . Carotid artery occlusion     a. Dopplers 07/2012: no significant high grade obstruction.  . Hyperlipidemia   . Abnormal CT scan, kidney     07/2012 - multiple small cysts  . Cholelithiasis     Seen on CT 07/2012    Past Surgical History  Procedure Laterality Date  . Coronary artery bypass graft  04/03/99  . Cardiac catheterization  04/03/99  . Carpal tunnel release      Excision mass dorsal left wrist  . Hernia repair  1988    Family History  Problem Relation Age of Onset  . Heart attack Mother     MI  . Hypertension  Sister   . Emphysema Brother     History   Social History  . Marital Status: Married    Spouse Name: N/A    Number of Children: 1  . Years of Education: N/A   Occupational History  . Plumbing     Retired in 2007   Social History Main Topics  . Smoking status: Current Some Day Smoker -- 50 years  . Smokeless tobacco: Not on file     Comment: has cut back   . Alcohol Use: 8.4 oz/week    14 Cans of beer per week  . Drug Use: No  . Sexually Active: Not on file   Other Topics Concern  . Not on file   Social History Narrative  . No narrative on file    ROS: Please see the HPI.  All other systems reviewed and negative.  PHYSICAL EXAM:  BP 140/60  Pulse 54  Ht 5\' 5"  (1.651 m)  Wt 158 lb (71.668 kg)  BMI 26.29 kg/m2  SpO2 95%  General: Well developed, well nourished, in no acute distress. Head:  Normocephalic and atraumatic. Neck: no JVD Lungs: Some prolonged expiration.   Heart: Normal S1 and S2. 3/6 SEM.   No DM.   Extremities: No clubbing or cyanosis. No edema. Neurologic: Alert and oriented x 3.  EKG:  NSR.  Inferior and lateral T inversion, consider ischemia.  Occasional PVC.  Compared to prior tracing, slightly more lateral changes.   ECHO  Study Conclusions  - Left ventricle: The cavity size was normal. Wall thickness was normal. Systolic function was normal. The estimated ejection fraction was in the range of 55% to 60%. - Aortic valve: Moderately calcified annulus. There was mild stenosis. Valve area: 1.45cm^2(VTI). Valve area: 1.71cm^2 (Vmax). Valve area: 1.45cm^2 (Vmean).  Cath DATA  Left mainstem: Heavily calcified. There is diffuse 70% narrowing in the LMCA. It is diseased to the take off of the large OM  Left anterior descending (LAD): Occluded after the septal  Left circumflex (LCx): Diffuse 70-80% segmental disease leading to a very large OM. This collateralizes multiple vessels including the PDA. It also retrograde fills a diagonal or ramus branch  Right coronary artery (RCA): 90% ostial and totally occluded.  SVG to the PDA PLA is occluded  SVG to the OM is occluded.  LIMA to the diagonal and LAD is intact but there is 70-80% narrowing near the LAD insertion. The LAD also collateralizes multiple vessels  Left ventriculography: Left ventricular systolic function is reduced, with inferobasal hypokinesis. EF estimate is 45%-50%.  Aortic root: Calcified leaflets with moderate AI.  Final Conclusions:  1. Occluded SVGs to the OM and RCA system.  2. Segmental LMCA and OM disease supplying large system.  3. Patent IMA with touchdown disease at insertion.  4. Moderate AI, AS  Plan;  TCTS consult.  Recommendations:  Shawnie Pons  08/10/2012, 10:22 AM       ASSESSMENT AND PLAN:  1.  Continued medical management 2,  Fu Dr. Clifton James in two months.

## 2013-01-31 NOTE — Assessment & Plan Note (Signed)
The patient remains on combination therapy.  His LDL is well controlled at the time of last checked in the fall

## 2013-01-31 NOTE — Assessment & Plan Note (Addendum)
This nice patient was hospitalized with unstable angina. Catheterization revealed vein graft occlusions, with continued patency the IMA, but collateralization multiple vessels with multiple areas of potential ischemia. The patient also had CAT scanning at that time, and also echocardiography because of his aortic stenosis. Dr. Excell Seltzer, Dr. Clifton James and Dr. Tyrone Sage all reviewed his films it was felt that he would be best treated medically. Since that time, the patient is returned to nearly asymptomatic status. Importantly, he does continue to smoke some. He realizes the consequences of this. He'll need Continued close followup, and we will make arrangements in our office for continued medical management. Should he have any increase in symptoms he knows promptly to get to the hospital. He and I have had a thorough discussion about his condition.  He does remain on 2 anti-anginal drugs.

## 2013-01-31 NOTE — Assessment & Plan Note (Signed)
The patient had Dopplers done in the fall of 2013 and does not have significant carotid disease

## 2013-02-02 ENCOUNTER — Telehealth: Payer: Self-pay | Admitting: Cardiovascular Disease

## 2013-02-02 ENCOUNTER — Encounter: Payer: Self-pay | Admitting: Cardiovascular Disease

## 2013-02-13 DIAGNOSIS — F17200 Nicotine dependence, unspecified, uncomplicated: Secondary | ICD-10-CM | POA: Diagnosis not present

## 2013-02-13 DIAGNOSIS — J329 Chronic sinusitis, unspecified: Secondary | ICD-10-CM | POA: Diagnosis not present

## 2013-02-13 DIAGNOSIS — J309 Allergic rhinitis, unspecified: Secondary | ICD-10-CM | POA: Diagnosis not present

## 2013-02-13 DIAGNOSIS — R062 Wheezing: Secondary | ICD-10-CM | POA: Diagnosis not present

## 2013-02-13 DIAGNOSIS — J40 Bronchitis, not specified as acute or chronic: Secondary | ICD-10-CM | POA: Diagnosis not present

## 2013-02-20 DIAGNOSIS — H9209 Otalgia, unspecified ear: Secondary | ICD-10-CM | POA: Diagnosis not present

## 2013-02-20 DIAGNOSIS — R0609 Other forms of dyspnea: Secondary | ICD-10-CM | POA: Diagnosis not present

## 2013-02-20 DIAGNOSIS — J40 Bronchitis, not specified as acute or chronic: Secondary | ICD-10-CM | POA: Diagnosis not present

## 2013-02-20 DIAGNOSIS — J449 Chronic obstructive pulmonary disease, unspecified: Secondary | ICD-10-CM | POA: Diagnosis not present

## 2013-02-20 DIAGNOSIS — I2510 Atherosclerotic heart disease of native coronary artery without angina pectoris: Secondary | ICD-10-CM | POA: Diagnosis not present

## 2013-03-05 DIAGNOSIS — I10 Essential (primary) hypertension: Secondary | ICD-10-CM | POA: Diagnosis not present

## 2013-03-05 DIAGNOSIS — J3089 Other allergic rhinitis: Secondary | ICD-10-CM | POA: Diagnosis not present

## 2013-03-05 DIAGNOSIS — J019 Acute sinusitis, unspecified: Secondary | ICD-10-CM | POA: Diagnosis not present

## 2013-03-05 DIAGNOSIS — R05 Cough: Secondary | ICD-10-CM | POA: Diagnosis not present

## 2013-03-27 DIAGNOSIS — J438 Other emphysema: Secondary | ICD-10-CM | POA: Diagnosis not present

## 2013-03-27 DIAGNOSIS — R1310 Dysphagia, unspecified: Secondary | ICD-10-CM | POA: Diagnosis not present

## 2013-04-03 ENCOUNTER — Other Ambulatory Visit: Payer: Self-pay | Admitting: Physician Assistant

## 2013-04-30 ENCOUNTER — Encounter: Payer: Self-pay | Admitting: Cardiovascular Disease

## 2013-04-30 ENCOUNTER — Ambulatory Visit (INDEPENDENT_AMBULATORY_CARE_PROVIDER_SITE_OTHER): Payer: Medicare Other | Admitting: Cardiovascular Disease

## 2013-04-30 VITALS — BP 126/74 | HR 68 | Ht 65.0 in | Wt 141.0 lb

## 2013-04-30 DIAGNOSIS — I2510 Atherosclerotic heart disease of native coronary artery without angina pectoris: Secondary | ICD-10-CM

## 2013-04-30 DIAGNOSIS — E785 Hyperlipidemia, unspecified: Secondary | ICD-10-CM | POA: Diagnosis not present

## 2013-04-30 DIAGNOSIS — I350 Nonrheumatic aortic (valve) stenosis: Secondary | ICD-10-CM

## 2013-04-30 DIAGNOSIS — I359 Nonrheumatic aortic valve disorder, unspecified: Secondary | ICD-10-CM | POA: Diagnosis not present

## 2013-04-30 NOTE — Progress Notes (Signed)
History of Present Illness: 67 yo male with history of CAD s/p CABG, HTN, HLD, mild AS here today for cardiac follow up. He has been followed by Dr. Riley Kill. He had CABG in 1993. Dr. Riley Kill has performed stenting procedures since then. Readmitted October 2013 and cath performed with occlusion of LAD, patent IMA to LAD, occluded proximal RCA with occluded SVG to PDA, severe disease in left main into Circumflex with occluded vein graft to Circumflex. Re-do CABG suggested but his aorta is severely calcified. He was seen by Dr. Tyrone Sage with CT surgery and not felt to be a candidate for re-do bypass. Imdur was added and he has done well since then.  Last echo 08/10/12 with mild AS, normal LV function.   He is here today for follow up. He has mild exertional chest pain, resolves quickly with NTG. No changes. He is having sticking sensations in his esophagus with swallowing. He has an appt with GI next week. No change in breathing. Still smoking 1/2 ppd. No palpitations, near syncope, syncope.   Primary Care Physician: [facility]  Last Lipid Profile:Lipid Panel     Component Value Date/Time   CHOL 129 08/10/2012 0124   TRIG 88 08/10/2012 0124   HDL 53 08/10/2012 0124   CHOLHDL 2.4 08/10/2012 0124   VLDL 18 08/10/2012 0124   LDLCALC 58 08/10/2012 0124     Past Medical History  Diagnosis Date  . Coronary artery disease     a. CABG 10/1991. b. cath 10/24 demonstrating occluded SVGs to OM & RCA; segmental LMCA & OM dz, patent IMA w/ insertional dz, moderate AI/AS -- for med rx for now.  . Hypertension   . Hyperlipoproteinemia   . Aortic valve disorders     a. Mod AS/AI by cath 07/2012.  . Back pain   . Carotid artery occlusion     a. Dopplers 07/2012: no significant high grade obstruction.  . Hyperlipidemia   . Abnormal CT scan, kidney     07/2012 - multiple small cysts  . Cholelithiasis     Seen on CT 07/2012    Past Surgical History  Procedure Laterality Date  . Coronary  artery bypass graft  04/03/99  . Cardiac catheterization  04/03/99  . Carpal tunnel release      Excision mass dorsal left wrist  . Hernia repair  1988    Current Outpatient Prescriptions  Medication Sig Dispense Refill  . amLODipine-benazepril (LOTREL) 5-20 MG per capsule Take 2 capsules by mouth daily.  60 capsule  6  . aspirin EC 81 MG EC tablet Take 1 tablet (81 mg total) by mouth daily.      Marland Kitchen ezetimibe-simvastatin (VYTORIN) 10-40 MG per tablet Take 1 tablet by mouth at bedtime.  30 tablet  6  . fexofenadine (ALLEGRA) 180 MG tablet Take 180 mg by mouth daily as needed. For allergies      . isosorbide mononitrate (IMDUR) 30 MG 24 hr tablet TAKE 1 TABLET EVERY DAY  30 tablet  3  . metoprolol tartrate (LOPRESSOR) 25 MG tablet Take 1 tablet (25 mg total) by mouth 2 (two) times daily.  60 tablet  6  . nitroGLYCERIN (NITROSTAT) 0.4 MG SL tablet Place 1 tablet (0.4 mg total) under the tongue every 5 (five) minutes as needed for chest pain (up to 3 doses).  25 tablet  4  . Omega-3 Fatty Acids (FISH OIL) 1000 MG CAPS Take 1,000 mg by mouth 2 (two) times daily.  No current facility-administered medications for this visit.    No Known Allergies  History   Social History  . Marital Status: Married    Spouse Name: N/A    Number of Children: 1  . Years of Education: N/A   Occupational History  . Plumbing     Retired in 2007   Social History Main Topics  . Smoking status: Current Some Day Smoker -- 50 years  . Smokeless tobacco: Not on file     Comment: has cut back   . Alcohol Use: 8.4 oz/week    14 Cans of beer per week  . Drug Use: No  . Sexually Active: Not on file   Other Topics Concern  . Not on file   Social History Narrative  . No narrative on file    Family History  Problem Relation Age of Onset  . Heart attack Mother     MI  . Hypertension Sister   . Emphysema Brother     Review of Systems:  As stated in the HPI and otherwise negative.   BP 126/74   Pulse 68  Ht 5\' 5"  (1.651 m)  Wt 141 lb (63.957 kg)  BMI 23.46 kg/m2  SpO2 98%  Physical Examination: General: Well developed, well nourished, NAD HEENT: OP clear, mucus membranes moist SKIN: warm, dry. No rashes. Neuro: No focal deficits Musculoskeletal: Muscle strength 5/5 all ext Psychiatric: Mood and affect normal Neck: No JVD, no carotid bruits, no thyromegaly, no lymphadenopathy. Lungs:Clear bilaterally, no wheezes, rhonci, crackles Cardiovascular: Regular rate and rhythm. No murmurs, gallops or rubs. Abdomen:Soft. Bowel sounds present. Non-tender.  Extremities: No lower extremity edema. Pulses are 2 + in the bilateral DP/PT.  Cardiac cath 08/09/13: Left mainstem: Heavily calcified. There is diffuse 70% narrowing in the LMCA. It is diseased to the take off of the large OM  Left anterior descending (LAD): Occluded after the septal  Left circumflex (LCx): Diffuse 70-80% segmental disease leading to a very large OM. This collateralizes multiple vessels including the PDA. It also retrograde fills a diagonal or ramus branch  Right coronary artery (RCA): 90% ostial and totally occluded.  SVG to the PDA PLA is occluded  SVG to the OM is occluded.  LIMA to the diagonal and LAD is intact but there is 70-80% narrowing near the LAD insertion. The LAD also collateralizes multiple vessels  Left ventriculography: Left ventricular systolic function is reduced, with inferobasal hypokinesis. EF estimate is 45%-50%.  Aortic root: Calcified leaflets with moderate AI.  Final Conclusions:  1. Occluded SVGs to the OM and RCA system.  2. Segmental LMCA and OM disease supplying large system.  3. Patent IMA with touchdown disease at insertion.  4. Moderate AI, AS   CTA chest October 2013: 1. Advanced atherosclerotic calcification involving the thoracic and abdominal aorta. No focal aneurysm. 2. Advanced atherosclerotic calcifications involving the coronary arteries and aortic branch vessels. 3.  No mediastinal or hilar mass or adenopathy. For no acute pulmonary findings. Minimal basilar scarring changes. 4. Multiple simple and hyperdense/hemorrhagic renal cysts. 5. Cholelithiasis.  Echo 08/10/12: Left ventricle: The cavity size was normal. Wall thickness was normal. Systolic function was normal. The estimated ejection fraction was in the range of 55% to 60%. - Aortic valve: Moderately calcified annulus. There was mild stenosis. Valve area: 1.45cm^2(VTI). Valve area: 1.71cm^2 (Vmax). Valve area: 1.45cm^2 (Vmean).  Assessment and Plan:   1. CAD: s/p CABG 1993 and most recent cath October 2013 with patent IMA to LAD and occlusion  of both vein grafts to the PDA and Circumflex. He has severe disease and he is aware of this. He is not a redo CABG candidate. The only intervention that would be helpful would be rotablator of left main into Circumflex which would be very high risk and only pursued if he had unrelenting angina. No change in symtpoms at this time. He has chest pain with swallowing. GI workup next week.  He does remain on 2 anti-anginal drugs.     2. CAROTID ARTERY DISEASE: The patient had Dopplers done in the fall of 2013 and does not have significant carotid disease   3. Hyperlipidemia: Continue statin. Lipids controlled.   4. Aortic valve stenosis: Mild by echo October 2013. Murmur sounds at least moderate. Repeat echo 2015.

## 2013-04-30 NOTE — Patient Instructions (Addendum)
You have a follow up appt scheduled with Dr. Clifton James on July 11, 2013 at 10:15

## 2013-05-03 ENCOUNTER — Ambulatory Visit: Payer: Medicare Other | Admitting: Cardiovascular Disease

## 2013-05-08 DIAGNOSIS — R1310 Dysphagia, unspecified: Secondary | ICD-10-CM | POA: Diagnosis not present

## 2013-05-08 DIAGNOSIS — K219 Gastro-esophageal reflux disease without esophagitis: Secondary | ICD-10-CM | POA: Diagnosis not present

## 2013-05-10 DIAGNOSIS — R1310 Dysphagia, unspecified: Secondary | ICD-10-CM | POA: Diagnosis not present

## 2013-05-10 DIAGNOSIS — K219 Gastro-esophageal reflux disease without esophagitis: Secondary | ICD-10-CM | POA: Diagnosis not present

## 2013-05-16 ENCOUNTER — Encounter: Payer: Self-pay | Admitting: Cardiovascular Disease

## 2013-05-16 ENCOUNTER — Telehealth: Payer: Self-pay | Admitting: Cardiovascular Disease

## 2013-05-16 NOTE — Telephone Encounter (Signed)
I wrote a letter of clearance and ok to hold ASA. Can we fax to the GI office? chris

## 2013-05-16 NOTE — Telephone Encounter (Signed)
Spoke with [place] at [facility]. They would like to schedule pt for upper endo for evaluation of dysphagia. May also need dilatation. Pt would need to hold ASA for 3 days prior to procedure. Requesting cardiac clearance for procedure and OK to hold ASA prior to procedure.

## 2013-05-16 NOTE — Telephone Encounter (Signed)
New problem   [facility] need cardiac clearance for pt to have upper endo. Please fax to [phone]

## 2013-05-17 NOTE — Telephone Encounter (Signed)
Letter faxed.

## 2013-06-29 DIAGNOSIS — K219 Gastro-esophageal reflux disease without esophagitis: Secondary | ICD-10-CM | POA: Diagnosis not present

## 2013-07-11 ENCOUNTER — Ambulatory Visit (INDEPENDENT_AMBULATORY_CARE_PROVIDER_SITE_OTHER): Payer: Medicare Other | Admitting: Cardiovascular Disease

## 2013-07-11 ENCOUNTER — Telehealth: Payer: Self-pay | Admitting: Cardiovascular Disease

## 2013-07-11 ENCOUNTER — Encounter: Payer: Self-pay | Admitting: Cardiovascular Disease

## 2013-07-11 VITALS — BP 160/84 | HR 90 | Ht 65.0 in

## 2013-07-11 DIAGNOSIS — F17200 Nicotine dependence, unspecified, uncomplicated: Secondary | ICD-10-CM | POA: Diagnosis not present

## 2013-07-11 DIAGNOSIS — I359 Nonrheumatic aortic valve disorder, unspecified: Secondary | ICD-10-CM | POA: Diagnosis not present

## 2013-07-11 DIAGNOSIS — I2510 Atherosclerotic heart disease of native coronary artery without angina pectoris: Secondary | ICD-10-CM

## 2013-07-11 DIAGNOSIS — Z720 Tobacco use: Secondary | ICD-10-CM

## 2013-07-11 DIAGNOSIS — E785 Hyperlipidemia, unspecified: Secondary | ICD-10-CM | POA: Diagnosis not present

## 2013-07-11 DIAGNOSIS — I350 Nonrheumatic aortic (valve) stenosis: Secondary | ICD-10-CM

## 2013-07-11 MED ORDER — EZETIMIBE-SIMVASTATIN 10-40 MG PO TABS: 1.0000 | ORAL_TABLET | Freq: Every day | ORAL | Status: DC

## 2013-07-11 MED ORDER — NITROGLYCERIN 0.4 MG SL SUBL: 0.4000 mg | SUBLINGUAL_TABLET | SUBLINGUAL | Status: DC | PRN

## 2013-07-11 MED ORDER — AMLODIPINE BESY-BENAZEPRIL HCL 5-20 MG PO CAPS: 2.0000 | ORAL_CAPSULE | Freq: Every day | ORAL | Status: DC

## 2013-07-11 MED ORDER — METOPROLOL TARTRATE 50 MG PO TABS: 50.0000 mg | ORAL_TABLET | Freq: Two times a day (BID) | ORAL | Status: DC

## 2013-07-11 MED ORDER — ISOSORBIDE MONONITRATE ER 30 MG PO TB24: 30.0000 mg | ORAL_TABLET | Freq: Every day | ORAL | Status: DC

## 2013-07-11 NOTE — Telephone Encounter (Signed)
My clinic note was addended to reflect clearance. cdm

## 2013-07-11 NOTE — Progress Notes (Addendum)
History of Present Illness: 67 yo male with history of CAD s/p CABG, HTN, HLD, mild AS here today for cardiac follow up. He has been followed by Dr. Riley Kill. He had CABG in 1993. Dr. Riley Kill has performed stenting procedures since then. Readmitted October 2013 and cath performed with occlusion of LAD, patent IMA to LAD, occluded proximal RCA with occluded SVG to PDA, severe disease in left main into Circumflex with occluded vein graft to Circumflex. Re-do CABG suggested but his aorta is severely calcified. He was seen by Dr. Tyrone Sage with CT surgery and not felt to be a candidate for re-do bypass. Imdur was added and he has done well since then.  Last echo 08/10/12 with mild AS, normal LV function. He has been seen by GI for recent swallowing trouble and has been started on a PPI.   He is here today for follow up. He has mild exertional chest pain, resolves quickly with NTG. No changes. He is having sticking sensations in his esophagus with swallowing. He has an appt with GI next week. No change in breathing. Still smoking 1/2 ppd. No palpitations, near syncope, syncope.   Primary Care Physician: [facility]  Last Lipid Profile:Lipid Panel     Component Value Date/Time   CHOL 129 08/10/2012 0124   TRIG 88 08/10/2012 0124   HDL 53 08/10/2012 0124   CHOLHDL 2.4 08/10/2012 0124   VLDL 18 08/10/2012 0124   LDLCALC 58 08/10/2012 0124     Past Medical History  Diagnosis Date  . Coronary artery disease     a. CABG 10/1991. b. cath 10/24 demonstrating occluded SVGs to OM & RCA; segmental LMCA & OM dz, patent IMA w/ insertional dz, moderate AI/AS -- for med rx for now.  . Hypertension   . Hyperlipoproteinemia   . Aortic valve disorders     a. Mod AS/AI by cath 07/2012.  . Back pain   . Carotid artery occlusion     a. Dopplers 07/2012: no significant high grade obstruction.  . Hyperlipidemia   . Abnormal CT scan, kidney     07/2012 - multiple small cysts  . Cholelithiasis     Seen  on CT 07/2012    Past Surgical History  Procedure Laterality Date  . Coronary artery bypass graft  04/03/99  . Cardiac catheterization  04/03/99  . Carpal tunnel release      Excision mass dorsal left wrist  . Hernia repair  1988    Current Outpatient Prescriptions  Medication Sig Dispense Refill  . amLODipine-benazepril (LOTREL) 5-20 MG per capsule Take 2 capsules by mouth daily.  60 capsule  6  . aspirin EC 81 MG EC tablet Take 1 tablet (81 mg total) by mouth daily.      Marland Kitchen ezetimibe-simvastatin (VYTORIN) 10-40 MG per tablet Take 1 tablet by mouth at bedtime.  30 tablet  6  . fexofenadine (ALLEGRA) 180 MG tablet Take 180 mg by mouth daily as needed. For allergies      . isosorbide mononitrate (IMDUR) 30 MG 24 hr tablet TAKE 1 TABLET EVERY DAY  30 tablet  3  . metoprolol tartrate (LOPRESSOR) 25 MG tablet Take 1 tablet (25 mg total) by mouth 2 (two) times daily.  60 tablet  6  . nitroGLYCERIN (NITROSTAT) 0.4 MG SL tablet Place 1 tablet (0.4 mg total) under the tongue every 5 (five) minutes as needed for chest pain (up to 3 doses).  25 tablet  4  . Omega-3 Fatty Acids (  FISH OIL) 1000 MG CAPS Take 1,000 mg by mouth 2 (two) times daily.         No current facility-administered medications for this visit.    No Known Allergies  History   Social History  . Marital Status: Married    Spouse Name: N/A    Number of Children: 1  . Years of Education: N/A   Occupational History  . Plumbing     Retired in 2007   Social History Main Topics  . Smoking status: Current Some Day Smoker -- 50 years  . Smokeless tobacco: Not on file     Comment: has cut back   . Alcohol Use: 8.4 oz/week    14 Cans of beer per week  . Drug Use: No  . Sexual Activity: Not on file   Other Topics Concern  . Not on file   Social History Narrative  . No narrative on file    Family History  Problem Relation Age of Onset  . Heart attack Mother     MI  . Hypertension Sister   . Emphysema Brother      Review of Systems:  As stated in the HPI and otherwise negative.   BP 160/84  Pulse 90  Ht 5\' 5"  (1.651 m)  SpO2 99%  Physical Examination: General: Well developed, well nourished, NAD HEENT: OP clear, mucus membranes moist SKIN: warm, dry. No rashes. Neuro: No focal deficits Musculoskeletal: Muscle strength 5/5 all ext Psychiatric: Mood and affect normal Neck: No JVD, no carotid bruits, no thyromegaly, no lymphadenopathy. Lungs:Clear bilaterally, no wheezes, rhonci, crackles Cardiovascular: Regular rate and rhythm. No murmurs, gallops or rubs. Abdomen:Soft. Bowel sounds present. Non-tender.  Extremities: No lower extremity edema. Pulses are 2 + in the bilateral DP/PT.  Cardiac cath 08/09/13: Left mainstem: Heavily calcified. There is diffuse 70% narrowing in the LMCA. It is diseased to the take off of the large OM  Left anterior descending (LAD): Occluded after the septal  Left circumflex (LCx): Diffuse 70-80% segmental disease leading to a very large OM. This collateralizes multiple vessels including the PDA. It also retrograde fills a diagonal or ramus branch  Right coronary artery (RCA): 90% ostial and totally occluded.  SVG to the PDA PLA is occluded  SVG to the OM is occluded.  LIMA to the diagonal and LAD is intact but there is 70-80% narrowing near the LAD insertion. The LAD also collateralizes multiple vessels  Left ventriculography: Left ventricular systolic function is reduced, with inferobasal hypokinesis. EF estimate is 45%-50%.  Aortic root: Calcified leaflets with moderate AI.  Final Conclusions:  1. Occluded SVGs to the OM and RCA system.  2. Segmental LMCA and OM disease supplying large system.  3. Patent IMA with touchdown disease at insertion.  4. Moderate AI, AS   CTA chest October 2013: 1. Advanced atherosclerotic calcification involving the thoracic and abdominal aorta. No focal aneurysm. 2. Advanced atherosclerotic calcifications involving the  coronary arteries and aortic branch vessels. 3. No mediastinal or hilar mass or adenopathy. For no acute pulmonary findings. Minimal basilar scarring changes. 4. Multiple simple and hyperdense/hemorrhagic renal cysts. 5. Cholelithiasis.  Echo 08/10/12: Left ventricle: The cavity size was normal. Wall thickness was normal. Systolic function was normal. The estimated ejection fraction was in the range of 55% to 60%. - Aortic valve: Moderately calcified annulus. There was mild stenosis. Valve area: 1.45cm^2(VTI). Valve area: 1.71cm^2 (Vmax). Valve area: 1.45cm^2 (Vmean).  Assessment and Plan:   1. CAD: s/p CABG 1993 and  most recent cath October 2013 with patent IMA to LAD and occlusion of both vein grafts to the PDA and Circumflex. He has severe disease and he is aware of this. He is not a redo CABG candidate. The only intervention that would be helpful would be rotablator of left main into Circumflex which would be very high risk and only pursued if he had unrelenting angina. No change in symptoms at this time. He has continued chest pain with swallowing. GI workup  In progress but EGD cancelled due to the fact that anesthesia felt he was too high risk for general anesthesia. It is not clear why he needed general anesthesia for an EGD. It would be favorable if this could be approached with conscious sedation. He is on a PPI for now while GI workup is being planned.  He does remain on 2 anti-anginal drugs.     2. CAROTID ARTERY DISEASE: The patient had Dopplers done in the fall of 2013 and does not have significant carotid disease   3. Hyperlipidemia: Continue statin. Lipids controlled.   4. Aortic valve stenosis: Mild by echo October 2013. Murmur sounds at least moderate. Repeat echo 2015.   5. HTN: BP elevated. Will increase metoprolol to 50 mg po BID.   6. Pre-operative clearance: He has plans for upcoming hand surgery and possible EGD. His CAD is currently stable. No ischemic testing is  necessary at this time. Would proceed with plans for surgery without further cardiac workup. He is at moderate risk for an intra-procedural cardiac event if general anesthesia is used but he understands this and wishes to proceed.

## 2013-07-11 NOTE — Patient Instructions (Addendum)
Your physician wants you to follow-up in:  6 months.  You will receive a reminder letter in the mail two months in advance. If you don't receive a letter, please call our office to schedule the follow-up appointment.  Your physician has recommended you make the following change in your medication:  Increase metoprolol tartrate to 50 mg by mouth twice daily     

## 2013-07-11 NOTE — Telephone Encounter (Signed)
New problem    [facility] need cardiac clearance for pt to have hand sx please fax to [facility] @ [phone]

## 2013-07-11 NOTE — Telephone Encounter (Signed)
Will fax office note to [facility] at Dr. Merrilee Seashore office at fax number below.

## 2013-07-11 NOTE — Telephone Encounter (Signed)
Spoke with Larita Fife at Dr. Merrilee Seashore office. Pt has Dupuytren's contractures of right hand and would like to have surgery for this. Procedure is elective. It would be done as an out patient. Axillary block planned but general anesthesia may be used if needed. Cardiac clearance is requested from Dr. Clifton James for surgery and use of general anesthesia if needed.

## 2013-07-19 DIAGNOSIS — Z23 Encounter for immunization: Secondary | ICD-10-CM | POA: Diagnosis not present

## 2013-07-23 DIAGNOSIS — M720 Palmar fascial fibromatosis [Dupuytren]: Secondary | ICD-10-CM | POA: Diagnosis not present

## 2013-07-24 ENCOUNTER — Other Ambulatory Visit: Payer: Self-pay | Admitting: Orthopedic Surgery

## 2013-07-24 ENCOUNTER — Encounter (HOSPITAL_BASED_OUTPATIENT_CLINIC_OR_DEPARTMENT_OTHER): Payer: Self-pay | Admitting: *Deleted

## 2013-07-24 NOTE — Progress Notes (Signed)
Pt has had hand surgery here-[facility]-did see dr Clifton James 07/11/13 and has clearance for surgery Will need istat

## 2013-07-27 ENCOUNTER — Encounter (HOSPITAL_BASED_OUTPATIENT_CLINIC_OR_DEPARTMENT_OTHER): Payer: Self-pay | Admitting: *Deleted

## 2013-07-30 ENCOUNTER — Encounter (HOSPITAL_BASED_OUTPATIENT_CLINIC_OR_DEPARTMENT_OTHER): Payer: Medicare Other | Admitting: Anesthesiology

## 2013-07-30 ENCOUNTER — Encounter (HOSPITAL_BASED_OUTPATIENT_CLINIC_OR_DEPARTMENT_OTHER)
Admission: RE | Disposition: A | Discharge status: Home / Self Care | Payer: Self-pay | Source: Ambulatory Visit | Attending: Orthopedic Surgery

## 2013-07-30 ENCOUNTER — Ambulatory Visit (HOSPITAL_BASED_OUTPATIENT_CLINIC_OR_DEPARTMENT_OTHER)
Admission: RE | Admit: 2013-07-30 | Discharge: 2013-07-30 | Disposition: A | Discharge status: Home / Self Care | Payer: Medicare Other | Source: Ambulatory Visit | Attending: Orthopedic Surgery | Admitting: Orthopedic Surgery

## 2013-07-30 ENCOUNTER — Ambulatory Visit (HOSPITAL_BASED_OUTPATIENT_CLINIC_OR_DEPARTMENT_OTHER): Payer: Medicare Other | Admitting: Anesthesiology

## 2013-07-30 ENCOUNTER — Encounter (HOSPITAL_BASED_OUTPATIENT_CLINIC_OR_DEPARTMENT_OTHER): Payer: Self-pay | Admitting: Certified Registered Nurse Anesthetist

## 2013-07-30 DIAGNOSIS — M720 Palmar fascial fibromatosis [Dupuytren]: Secondary | ICD-10-CM | POA: Insufficient documentation

## 2013-07-30 DIAGNOSIS — G8918 Other acute postprocedural pain: Secondary | ICD-10-CM | POA: Diagnosis not present

## 2013-07-30 DIAGNOSIS — I2510 Atherosclerotic heart disease of native coronary artery without angina pectoris: Secondary | ICD-10-CM | POA: Insufficient documentation

## 2013-07-30 DIAGNOSIS — I214 Non-ST elevation (NSTEMI) myocardial infarction: Secondary | ICD-10-CM | POA: Diagnosis not present

## 2013-07-30 DIAGNOSIS — M20029 Boutonniere deformity of unspecified finger(s): Secondary | ICD-10-CM | POA: Diagnosis not present

## 2013-07-30 DIAGNOSIS — R0602 Shortness of breath: Secondary | ICD-10-CM | POA: Insufficient documentation

## 2013-07-30 DIAGNOSIS — I2582 Chronic total occlusion of coronary artery: Secondary | ICD-10-CM | POA: Diagnosis not present

## 2013-07-30 DIAGNOSIS — F17200 Nicotine dependence, unspecified, uncomplicated: Secondary | ICD-10-CM | POA: Insufficient documentation

## 2013-07-30 DIAGNOSIS — E785 Hyperlipidemia, unspecified: Secondary | ICD-10-CM | POA: Insufficient documentation

## 2013-07-30 DIAGNOSIS — I25810 Atherosclerosis of coronary artery bypass graft(s) without angina pectoris: Secondary | ICD-10-CM | POA: Diagnosis not present

## 2013-07-30 DIAGNOSIS — J690 Pneumonitis due to inhalation of food and vomit: Secondary | ICD-10-CM | POA: Diagnosis not present

## 2013-07-30 DIAGNOSIS — I6529 Occlusion and stenosis of unspecified carotid artery: Secondary | ICD-10-CM | POA: Insufficient documentation

## 2013-07-30 DIAGNOSIS — I359 Nonrheumatic aortic valve disorder, unspecified: Secondary | ICD-10-CM | POA: Insufficient documentation

## 2013-07-30 DIAGNOSIS — H9190 Unspecified hearing loss, unspecified ear: Secondary | ICD-10-CM | POA: Insufficient documentation

## 2013-07-30 HISTORY — PX: FASCIOTOMY: SHX132

## 2013-07-30 LAB — POCT I-STAT, CHEM 8
BUN: 17 mg/dL (ref 6–23)
Calcium, Ion: 1.13 mmol/L (ref 1.13–1.30)
Chloride: 105 mEq/L (ref 96–112)
Creatinine, Ser: 0.8 mg/dL (ref 0.50–1.35)
Glucose, Bld: 102 mg/dL — ABNORMAL HIGH (ref 70–99)
TCO2: 24 mmol/L (ref 0–100)

## 2013-07-30 SURGERY — FASCIOTOMY, UPPER EXTREMITY
Anesthesia: General | Site: Finger | Laterality: Right | Wound class: Clean

## 2013-07-30 MED ORDER — MIDAZOLAM HCL 2 MG/2ML IJ SOLN: 1.0000 mg | INTRAMUSCULAR | Status: DC | PRN

## 2013-07-30 MED ORDER — FENTANYL CITRATE 0.05 MG/ML IJ SOLN: 25.0000 ug | INTRAMUSCULAR | Status: DC | PRN

## 2013-07-30 MED ORDER — ONDANSETRON HCL 4 MG/2ML IJ SOLN
INTRAMUSCULAR | Status: DC | PRN
  Administered 2013-07-30: 4 mg via INTRAMUSCULAR

## 2013-07-30 MED ORDER — BUPIVACAINE-EPINEPHRINE PF 0.5-1:200000 % IJ SOLN
INTRAMUSCULAR | Status: DC | PRN
  Administered 2013-07-30: 20 mL

## 2013-07-30 MED ORDER — PROPOFOL 10 MG/ML IV BOLUS
INTRAVENOUS | Status: DC | PRN
  Administered 2013-07-30: 125 mg via INTRAVENOUS

## 2013-07-30 MED ORDER — DEXAMETHASONE SODIUM PHOSPHATE 10 MG/ML IJ SOLN
INTRAMUSCULAR | Status: DC | PRN
  Administered 2013-07-30: 4 mg via INTRAVENOUS

## 2013-07-30 MED ORDER — MIDAZOLAM HCL 2 MG/2ML IJ SOLN
1.0000 mg | INTRAMUSCULAR | Status: DC | PRN
  Administered 2013-07-30: 2 mg via INTRAVENOUS

## 2013-07-30 MED ORDER — CEFAZOLIN SODIUM-DEXTROSE 2-3 GM-% IV SOLR
2.0000 g | INTRAVENOUS | Status: AC
  Administered 2013-07-30: 2 g via INTRAVENOUS

## 2013-07-30 MED ORDER — OXYCODONE HCL 5 MG/5ML PO SOLN: 5.0000 mg | Freq: Once | ORAL | Status: DC | PRN

## 2013-07-30 MED ORDER — CHLORHEXIDINE GLUCONATE 4 % EX LIQD: 60.0000 mL | Freq: Once | CUTANEOUS | Status: DC

## 2013-07-30 MED ORDER — FENTANYL CITRATE 0.05 MG/ML IJ SOLN
50.0000 ug | INTRAMUSCULAR | Status: DC | PRN
  Administered 2013-07-30: 100 ug via INTRAVENOUS

## 2013-07-30 MED ORDER — HYDROCODONE-ACETAMINOPHEN 5-325 MG PO TABS: 1.0000 | ORAL_TABLET | Freq: Four times a day (QID) | ORAL | Status: DC | PRN

## 2013-07-30 MED ORDER — LACTATED RINGERS IV SOLN
INTRAVENOUS | Status: DC
  Administered 2013-07-30: 12:00:00 via INTRAVENOUS

## 2013-07-30 MED ORDER — OXYCODONE HCL 5 MG PO TABS: 5.0000 mg | ORAL_TABLET | Freq: Once | ORAL | Status: DC | PRN

## 2013-07-30 MED ORDER — FENTANYL CITRATE 0.05 MG/ML IJ SOLN: 50.0000 ug | Freq: Once | INTRAMUSCULAR | Status: DC

## 2013-07-30 MED ORDER — 0.9 % SODIUM CHLORIDE (POUR BTL) OPTIME
TOPICAL | Status: DC | PRN
  Administered 2013-07-30: 200 mL

## 2013-07-30 MED ORDER — LIDOCAINE HCL (CARDIAC) 20 MG/ML IV SOLN
INTRAVENOUS | Status: DC | PRN
  Administered 2013-07-30: 30 mg via INTRAVENOUS

## 2013-07-30 MED ORDER — PROMETHAZINE HCL 25 MG/ML IJ SOLN: 6.2500 mg | INTRAMUSCULAR | Status: DC | PRN

## 2013-07-30 MED ORDER — DEXAMETHASONE SODIUM PHOSPHATE 4 MG/ML IJ SOLN
INTRAMUSCULAR | Status: DC | PRN
  Administered 2013-07-30: 4 mg

## 2013-07-30 SURGICAL SUPPLY — 46 items
BANDAGE GAUZE ELAST BULKY 4 IN (GAUZE/BANDAGES/DRESSINGS) ×2 IMPLANT
BLADE MINI RND TIP GREEN BEAV (BLADE) ×2 IMPLANT
BLADE SURG 15 STRL LF DISP TIS (BLADE) ×1 IMPLANT
BLADE SURG 15 STRL SS (BLADE) ×2
BNDG CMPR 9X4 STRL LF SNTH (GAUZE/BANDAGES/DRESSINGS) ×1
BNDG COHESIVE 3X5 TAN STRL LF (GAUZE/BANDAGES/DRESSINGS) ×2 IMPLANT
BNDG ESMARK 4X9 LF (GAUZE/BANDAGES/DRESSINGS) ×2 IMPLANT
CHLORAPREP W/TINT 26ML (MISCELLANEOUS) ×2 IMPLANT
CLOTH BEACON ORANGE TIMEOUT ST (SAFETY) ×1 IMPLANT
CORDS BIPOLAR (ELECTRODE) ×2 IMPLANT
COVER MAYO STAND STRL (DRAPES) ×2 IMPLANT
COVER TABLE BACK 60X90 (DRAPES) ×2 IMPLANT
CUFF TOURNIQUET SINGLE 18IN (TOURNIQUET CUFF) ×2 IMPLANT
DECANTER SPIKE VIAL GLASS SM (MISCELLANEOUS) IMPLANT
DRAPE EXTREMITY T 121X128X90 (DRAPE) ×2 IMPLANT
DRAPE SURG 17X23 STRL (DRAPES) ×2 IMPLANT
DRSG KUZMA FLUFF (GAUZE/BANDAGES/DRESSINGS) IMPLANT
GAUZE XEROFORM 1X8 LF (GAUZE/BANDAGES/DRESSINGS) ×2 IMPLANT
GLOVE BIO SURGEON STRL SZ 6.5 (GLOVE) ×1 IMPLANT
GLOVE BIOGEL PI IND STRL 7.0 (GLOVE) IMPLANT
GLOVE BIOGEL PI INDICATOR 7.0 (GLOVE) ×1
GLOVE ECLIPSE 6.5 STRL STRAW (GLOVE) ×1 IMPLANT
GLOVE EXAM NITRILE MD LF STRL (GLOVE) ×1 IMPLANT
GLOVE SURG ORTHO 8.0 STRL STRW (GLOVE) ×2 IMPLANT
GOWN BRE IMP PREV XXLGXLNG (GOWN DISPOSABLE) ×2 IMPLANT
GOWN PREVENTION PLUS XLARGE (GOWN DISPOSABLE) ×2 IMPLANT
NS IRRIG 1000ML POUR BTL (IV SOLUTION) ×2 IMPLANT
PACK BASIN DAY SURGERY FS (CUSTOM PROCEDURE TRAY) ×2 IMPLANT
PAD CAST 3X4 CTTN HI CHSV (CAST SUPPLIES) IMPLANT
PADDING CAST ABS 3INX4YD NS (CAST SUPPLIES)
PADDING CAST ABS 4INX4YD NS (CAST SUPPLIES)
PADDING CAST ABS COTTON 3X4 (CAST SUPPLIES) IMPLANT
PADDING CAST ABS COTTON 4X4 ST (CAST SUPPLIES) ×1 IMPLANT
PADDING CAST COTTON 3X4 STRL (CAST SUPPLIES) ×2
SLEEVE SCD COMPRESS KNEE MED (MISCELLANEOUS) ×2 IMPLANT
SPLINT PLASTER CAST XFAST 3X15 (CAST SUPPLIES) ×8 IMPLANT
SPLINT PLASTER XTRA FASTSET 3X (CAST SUPPLIES) ×5
SPONGE GAUZE 4X4 12PLY (GAUZE/BANDAGES/DRESSINGS) ×2 IMPLANT
STOCKINETTE 4X48 STRL (DRAPES) ×2 IMPLANT
SUT SILK 2 0 FS (SUTURE) IMPLANT
SUT VICRYL RAPID 5 0 P 3 (SUTURE) IMPLANT
SUT VICRYL RAPIDE 4/0 PS 2 (SUTURE) ×2 IMPLANT
SYR BULB 3OZ (MISCELLANEOUS) ×2 IMPLANT
SYR CONTROL 10ML LL (SYRINGE) IMPLANT
TOWEL OR 17X24 6PK STRL BLUE (TOWEL DISPOSABLE) ×4 IMPLANT
UNDERPAD 30X30 INCONTINENT (UNDERPADS AND DIAPERS) IMPLANT

## 2013-07-30 NOTE — Op Note (Signed)
Dictation Number [id number]

## 2013-07-30 NOTE — H&P (Signed)
Steve Durham is a 67 year-old former patient with contractures of his right ring and small fingers.  This has been going on for approximately two year.  He has no history of injury.  He is of [place] descent.  He does not have these on his feet or penis.  He states they have been present for approximately one year.  He has no history of diabetes, thyroid problems, arthritis or gout.  He is not complaining of any pain or discomfort.   These have not improved. He shows an 80 degree flexion deformity to the PIP joint of the ring, 65 to the little  ALLERGIES:   None.  MEDICATIONS:    Amlodipine/benazepril, aspirin, ezetimibe/simvastatin, fexofenadine, isosorbide mononitrate, metoprolol, nicotine patch, nitroglycerin, fish oil and omeprazole.  SURGICAL HISTORY:     Bypass surgery.  FAMILY MEDICAL HISTORY:    Positive for heart disease, high blood pressure.  SOCIAL HISTORY:     He smokes a pack a day and is advised to quit with the reasons behind this.  He drinks socially and is married.    REVIEW OF SYSTEMS:  Positive for glasses, hearing loss, high blood pressure, shortness of breath, otherwise negative 14 points.      Steve Durham is an 67 y.o. male.   Chief Complaint: Dupuytren's contracture Rt ring and small  HPI: see above  Past Medical History  Diagnosis Date  . Hypertension   . Hyperlipoproteinemia   . Aortic valve disorders     a. Mod AS/AI by cath 07/2012.  . Back pain   . Carotid artery occlusion     a. Dopplers 07/2012: no significant high grade obstruction.  . Hyperlipidemia   . Abnormal CT scan, kidney     07/2012 - multiple small cysts  . Cholelithiasis     Seen on CT 07/2012  . Coronary artery disease     a. CABG 10/1991. b. cath 10/24 demonstrating occluded SVGs to OM & RCA; segmental LMCA & OM dz, patent IMA w/ insertional dz, moderate AI/AS -- for med rx for now.    Past Surgical History  Procedure Laterality Date  . Cardiac catheterization  04/03/99  .  Carpal tunnel release  2007    Excision mass dorsal left wrist  . Hernia repair  1988  . Coronary artery bypass graft  1993    Family History  Problem Relation Age of Onset  . Heart attack Mother     MI  . Hypertension Sister   . Emphysema Brother    Social History:  reports that he has been smoking.  He does not have any smokeless tobacco history on file. He reports that he drinks about 8.4 ounces of alcohol per week. He reports that he does not use illicit drugs.  Allergies: No Known Allergies  No prescriptions prior to admission    No results found for this or any previous visit (from the past 48 hour(s)).  No results found.   Pertinent items are noted in HPI.  Height 5\' 5"  (1.651 m), weight 141 lb (63.957 kg).  General appearance: alert, cooperative and appears stated age Head: Normocephalic, without obvious abnormality Neck: no JVD Resp: clear to auscultation bilaterally Cardio: regular rate and rhythm, S1, S2 normal, no murmur, click, rub or gallop GI: soft, non-tender; bowel sounds normal; no masses,  no organomegaly Extremities: extremities normal, atraumatic, no cyanosis or edema Pulses: 2+ and symmetric Skin: Skin color, texture, turgor normal. No rashes or lesions Neurologic: Grossly normal Incision/Wound:  na  Assessment/Plan RADIOGRAPHS:     X-rays reveal minimal degenerative changes with the flexion deformities to the ring and little fingers.   DIAGNOSIS:     Dupuytren's contracture ring and little fingers, right hand.    The risks and complications of each of the procedures are discussed along with recurrence rate.  He is aware there is potential for infection, potential for recurrence between 11-22-08 years with an approximate 10% recurrence rate each year with any of the treatment modalities, specifically fasciectomy, fasciotomy; the Xiaflex may have a recurrence rate of 50% in five years with a 60% recurrence rate of aponeurotomy at two years, the  possibility of loss of fingers, the possibility of stiffness in flexion and/or extension.    He would like to proceed with fasciotomies to that side. We would recommend placement of Digit Widget on both ring and small fingers in an effort to see if this can straighten the PIP joint. He is advised of 50% correction. The pre, peri and post op course are discussed along with risks and complications. He is aware there is no guarantee with surgery, possibility of infection, recurrence, injury to arteries, nerves and tendons, incomplete relief of symptoms and dystrophy.  He is scheduled for fasciotomies ring and small fingers right hand with application of Digit Widgets both ring and small finger.  Steve Durham 07/30/2013, 10:57 AM

## 2013-07-30 NOTE — Brief Op Note (Signed)
07/30/2013  2:17 PM  PATIENT:  Arbie Cookey Zeoli  67 y.o. male  PRE-OPERATIVE DIAGNOSIS:  Dupuytren's Contracture Right Ringer Finger/Right Small Finger  POST-OPERATIVE DIAGNOSIS:  Dupuytren's Contracture Right Ringer Finger/Right Small Finger  PROCEDURE:  Procedure(s): OPEN FASCIOTOMY RIGHT RING FINGER & RIGHT SMALL FINGER MULTIPLE LEVELS (Right)  SURGEON:  Surgeon(s) and Role:    * Nicki Reaper, MD - Primary  PHYSICIAN ASSISTANT:   ASSISTANTS: none   ANESTHESIA:   regional and general  EBL:  Total I/O In: 700 [I.V.:700] Out: -   BLOOD ADMINISTERED:none  DRAINS: none   LOCAL MEDICATIONS USED:  NONE  SPECIMEN:  No Specimen  DISPOSITION OF SPECIMEN:  N/A  COUNTS:  YES  TOURNIQUET:   Total Tourniquet Time Documented: Upper Arm (Right) - 33 minutes Total: Upper Arm (Right) - 33 minutes   DICTATION: .Other Dictation: Dictation Number [id number]  PLAN OF CARE: Discharge to home after PACU  PATIENT DISPOSITION:  PACU - hemodynamically stable.

## 2013-07-30 NOTE — Anesthesia Procedure Notes (Signed)
Anesthesia Regional Block:  Supraclavicular block  Pre-Anesthetic Checklist: ,, timeout performed, Correct Patient, Correct Site, Correct Laterality, Correct Procedure, Correct Position, site marked, Risks and benefits discussed,  Surgical consent,  Pre-op evaluation,  At surgeon's request and post-op pain management  Laterality: Right  Prep: chloraprep       Needles:  Injection technique: Single-shot  Needle Type: Echogenic Stimulator Needle     Needle Length: 5cm 5 cm Needle Gauge: 22 and 22 G    Additional Needles:  Procedures: ultrasound guided (picture in chart) Supraclavicular block Narrative:  Start time: 07/30/2013 12:38 PM End time: 07/30/2013 12:45 PM Injection made incrementally with aspirations every 5 mL. Anesthesiologist: Dr Gypsy Balsam  Additional Notes: R Supraclav Nerve Block  [phone] POP CHG prep, sterile tech #22 stim/echo needle with good [place] visualization-PIX in chart Vernia Buff .5% w/epi 1:200000 20cc+decadron 4mg  infiltrated Multiple neg asp No compl Dr Gypsy Balsam

## 2013-07-30 NOTE — Anesthesia Preprocedure Evaluation (Signed)
Anesthesia Evaluation  Patient identified by MRN, date of birth, ID band Patient awake    Reviewed: Allergy & Precautions, H&P , NPO status , Patient's Chart, lab work & pertinent test results  Airway Mallampati: I TM Distance: >3 FB Neck ROM: Full    Dental   Pulmonary shortness of breath, COPDCurrent Smoker,  + rhonchi         Cardiovascular hypertension, + angina + CAD + Valvular Problems/Murmurs AS Rhythm:Regular Rate:Normal  Carotid art occlusion   Neuro/Psych    GI/Hepatic   Endo/Other    Renal/GU      Musculoskeletal   Abdominal   Peds  Hematology   Anesthesia Other Findings   Reproductive/Obstetrics                           Anesthesia Physical Anesthesia Plan  ASA: III  Anesthesia Plan: General   Post-op Pain Management:    Induction: Intravenous  Airway Management Planned: LMA  Additional Equipment:   Intra-op Plan:   Post-operative Plan: Extubation in OR  Informed Consent: I have reviewed the patients History and Physical, chart, labs and discussed the procedure including the risks, benefits and alternatives for the proposed anesthesia with the patient or authorized representative who has indicated his/her understanding and acceptance.     Plan Discussed with: CRNA and Surgeon  Anesthesia Plan Comments:         Anesthesia Quick Evaluation

## 2013-07-30 NOTE — Transfer of Care (Signed)
Immediate Anesthesia Transfer of Care Note  Patient: Steve Durham  Procedure(s) Performed: Procedure(s): OPEN FASCIOTOMY RIGHT RING FINGER & RIGHT SMALL FINGER MULTIPLE LEVELS (Right)  Patient Location: PACU  Anesthesia Type:GA combined with regional for post-op pain  Level of Consciousness: awake, alert , oriented and patient cooperative  Airway & Oxygen Therapy: Patient Spontanous Breathing and Patient connected to face mask oxygen  Post-op Assessment: Report given to PACU RN and Post -op Vital signs reviewed and stable  Post vital signs: Reviewed and stable  Complications: No apparent anesthesia complications

## 2013-07-30 NOTE — Anesthesia Postprocedure Evaluation (Signed)
  Anesthesia Post-op Note  Patient: Steve Durham  Procedure(s) Performed: Procedure(s): OPEN FASCIOTOMY RIGHT RING FINGER & RIGHT SMALL FINGER MULTIPLE LEVELS (Right)  Patient Location: PACU  Anesthesia Type:GA combined with regional for post-op pain  Level of Consciousness: awake and alert   Airway and Oxygen Therapy: Patient Spontanous Breathing  Post-op Pain: mild  Post-op Assessment: Post-op Vital signs reviewed, Patient's Cardiovascular Status Stable, Respiratory Function Stable, Patent Airway, No signs of Nausea or vomiting and Pain level controlled  Post-op Vital Signs: Reviewed and stable  Complications: No apparent anesthesia complications

## 2013-07-30 NOTE — Progress Notes (Signed)
Assisted Dr. Kasik with right, ultrasound guided, supraclavicular block. Side rails up, monitors on throughout procedure. See vital signs in flow sheet. Tolerated Procedure well. 

## 2013-07-31 ENCOUNTER — Encounter (HOSPITAL_COMMUNITY): Payer: Self-pay | Admitting: Physician Assistant

## 2013-07-31 ENCOUNTER — Inpatient Hospital Stay (HOSPITAL_COMMUNITY): Payer: Medicare Other

## 2013-07-31 ENCOUNTER — Encounter (HOSPITAL_COMMUNITY)
Admission: EM | Disposition: A | Discharge status: Home / Self Care | Payer: Medicare Other | Source: Ambulatory Visit | Attending: Cardiovascular Disease

## 2013-07-31 ENCOUNTER — Inpatient Hospital Stay (HOSPITAL_COMMUNITY)
Admission: EM | Admit: 2013-07-31 | Discharge: 2013-08-04 | DRG: 246 | Disposition: A | Discharge status: Home / Self Care | Payer: Medicare Other | Source: Ambulatory Visit | Attending: Cardiovascular Disease | Admitting: Cardiovascular Disease

## 2013-07-31 DIAGNOSIS — R079 Chest pain, unspecified: Secondary | ICD-10-CM | POA: Diagnosis not present

## 2013-07-31 DIAGNOSIS — E785 Hyperlipidemia, unspecified: Secondary | ICD-10-CM

## 2013-07-31 DIAGNOSIS — I200 Unstable angina: Secondary | ICD-10-CM | POA: Diagnosis not present

## 2013-07-31 DIAGNOSIS — M549 Dorsalgia, unspecified: Secondary | ICD-10-CM

## 2013-07-31 DIAGNOSIS — Z7289 Other problems related to lifestyle: Secondary | ICD-10-CM

## 2013-07-31 DIAGNOSIS — F1090 Alcohol use, unspecified, uncomplicated: Secondary | ICD-10-CM

## 2013-07-31 DIAGNOSIS — I10 Essential (primary) hypertension: Secondary | ICD-10-CM

## 2013-07-31 DIAGNOSIS — I351 Nonrheumatic aortic (valve) insufficiency: Secondary | ICD-10-CM

## 2013-07-31 DIAGNOSIS — I214 Non-ST elevation (NSTEMI) myocardial infarction: Secondary | ICD-10-CM

## 2013-07-31 DIAGNOSIS — J90 Pleural effusion, not elsewhere classified: Secondary | ICD-10-CM | POA: Diagnosis not present

## 2013-07-31 DIAGNOSIS — K219 Gastro-esophageal reflux disease without esophagitis: Secondary | ICD-10-CM | POA: Diagnosis present

## 2013-07-31 DIAGNOSIS — F17210 Nicotine dependence, cigarettes, uncomplicated: Secondary | ICD-10-CM | POA: Diagnosis present

## 2013-07-31 DIAGNOSIS — I350 Nonrheumatic aortic (valve) stenosis: Secondary | ICD-10-CM

## 2013-07-31 DIAGNOSIS — K8020 Calculus of gallbladder without cholecystitis without obstruction: Secondary | ICD-10-CM | POA: Diagnosis present

## 2013-07-31 DIAGNOSIS — E876 Hypokalemia: Secondary | ICD-10-CM | POA: Diagnosis not present

## 2013-07-31 DIAGNOSIS — I359 Nonrheumatic aortic valve disorder, unspecified: Secondary | ICD-10-CM

## 2013-07-31 DIAGNOSIS — I25810 Atherosclerosis of coronary artery bypass graft(s) without angina pectoris: Secondary | ICD-10-CM | POA: Diagnosis not present

## 2013-07-31 DIAGNOSIS — I213 ST elevation (STEMI) myocardial infarction of unspecified site: Secondary | ICD-10-CM | POA: Diagnosis present

## 2013-07-31 DIAGNOSIS — Z7982 Long term (current) use of aspirin: Secondary | ICD-10-CM

## 2013-07-31 DIAGNOSIS — I2510 Atherosclerotic heart disease of native coronary artery without angina pectoris: Secondary | ICD-10-CM | POA: Diagnosis not present

## 2013-07-31 DIAGNOSIS — F1010 Alcohol abuse, uncomplicated: Secondary | ICD-10-CM | POA: Diagnosis present

## 2013-07-31 DIAGNOSIS — J690 Pneumonitis due to inhalation of food and vomit: Secondary | ICD-10-CM

## 2013-07-31 DIAGNOSIS — J449 Chronic obstructive pulmonary disease, unspecified: Secondary | ICD-10-CM | POA: Diagnosis not present

## 2013-07-31 DIAGNOSIS — F17200 Nicotine dependence, unspecified, uncomplicated: Secondary | ICD-10-CM | POA: Diagnosis present

## 2013-07-31 DIAGNOSIS — I700 Atherosclerosis of aorta: Secondary | ICD-10-CM | POA: Diagnosis present

## 2013-07-31 DIAGNOSIS — J189 Pneumonia, unspecified organism: Secondary | ICD-10-CM

## 2013-07-31 DIAGNOSIS — Z720 Tobacco use: Secondary | ICD-10-CM

## 2013-07-31 DIAGNOSIS — I2582 Chronic total occlusion of coronary artery: Secondary | ICD-10-CM | POA: Diagnosis present

## 2013-07-31 HISTORY — PX: PERCUTANEOUS CORONARY STENT INTERVENTION (PCI-S): SHX5485

## 2013-07-31 LAB — APTT: aPTT: 55 seconds — ABNORMAL HIGH (ref 24–37)

## 2013-07-31 LAB — PROTIME-INR: Prothrombin Time: 16.7 seconds — ABNORMAL HIGH (ref 11.6–15.2)

## 2013-07-31 LAB — CBC
Hemoglobin: 16.7 g/dL (ref 13.0–17.0)
MCHC: 34.6 g/dL (ref 30.0–36.0)
RDW: 12.6 % (ref 11.5–15.5)
WBC: 31 10*3/uL — ABNORMAL HIGH (ref 4.0–10.5)

## 2013-07-31 LAB — LIPID PANEL
HDL: 66 mg/dL (ref >39)
LDL Cholesterol: 72 mg/dL (ref 0–99)
Total CHOL/HDL Ratio: 2.3 RATIO
Triglycerides: 83 mg/dL (ref <150)

## 2013-07-31 LAB — COMPREHENSIVE METABOLIC PANEL
Albumin: 3.6 g/dL (ref 3.5–5.2)
Alkaline Phosphatase: 69 U/L (ref 39–117)
BUN: 19 mg/dL (ref 6–23)
Chloride: 100 mEq/L (ref 96–112)
Creatinine, Ser: 0.75 mg/dL (ref 0.50–1.35)
GFR calc non Af Amer: >90 mL/min (ref >90)
Potassium: 3.4 mEq/L — ABNORMAL LOW (ref 3.5–5.1)
Total Protein: 7.3 g/dL (ref 6.0–8.3)

## 2013-07-31 LAB — POCT I-STAT, CHEM 8
BUN: 28 mg/dL — ABNORMAL HIGH (ref 6–23)
Creatinine, Ser: 0.9 mg/dL (ref 0.50–1.35)
Hemoglobin: 18.4 g/dL — ABNORMAL HIGH (ref 13.0–17.0)
Potassium: 3.7 mEq/L (ref 3.5–5.1)
Sodium: 137 mEq/L (ref 135–145)

## 2013-07-31 LAB — CK TOTAL AND CKMB (NOT AT ARMC)
CK, MB: 24.7 ng/mL (ref 0.3–4.0)
Relative Index: 6.6 — ABNORMAL HIGH (ref 0.0–2.5)
Total CK: 373 U/L — ABNORMAL HIGH (ref 7–232)

## 2013-07-31 LAB — POCT ACTIVATED CLOTTING TIME: Activated Clotting Time: 334 seconds

## 2013-07-31 LAB — TROPONIN I
Troponin I: 1.13 ng/mL (ref <0.30)
Troponin I: 4.79 ng/mL (ref <0.30)

## 2013-07-31 LAB — HEMOGLOBIN A1C
Hgb A1c MFr Bld: 5.7 % — ABNORMAL HIGH (ref <5.7)
Mean Plasma Glucose: 117 mg/dL — ABNORMAL HIGH (ref <117)

## 2013-07-31 LAB — MRSA PCR SCREENING: MRSA by PCR: NEGATIVE

## 2013-07-31 SURGERY — PERCUTANEOUS CORONARY STENT INTERVENTION (PCI-S)

## 2013-07-31 MED ORDER — SODIUM CHLORIDE 0.9 % IV SOLN
0.2500 mg/kg/h | INTRAVENOUS | Status: AC
  Filled 2013-07-31: qty 250

## 2013-07-31 MED ORDER — ONDANSETRON HCL 4 MG/2ML IJ SOLN
INTRAMUSCULAR | Status: AC
  Filled 2013-07-31: qty 2

## 2013-07-31 MED ORDER — MIDAZOLAM HCL 2 MG/2ML IJ SOLN
INTRAMUSCULAR | Status: AC
  Filled 2013-07-31: qty 2

## 2013-07-31 MED ORDER — IOHEXOL 350 MG/ML SOLN
80.0000 mL | Freq: Once | INTRAVENOUS | Status: AC | PRN
  Administered 2013-07-31: 80 mL via INTRAVENOUS

## 2013-07-31 MED ORDER — NITROGLYCERIN 0.2 MG/ML ON CALL CATH LAB
INTRAVENOUS | Status: AC
  Filled 2013-07-31: qty 1

## 2013-07-31 MED ORDER — ONDANSETRON HCL 4 MG/2ML IJ SOLN
4.0000 mg | Freq: Four times a day (QID) | INTRAMUSCULAR | Status: DC | PRN
  Administered 2013-07-31 – 2013-08-01 (×2): 4 mg via INTRAVENOUS
  Filled 2013-07-31 (×2): qty 2

## 2013-07-31 MED ORDER — DEXTROSE 5 % IV SOLN
1.0000 g | Freq: Two times a day (BID) | INTRAVENOUS | Status: DC
  Administered 2013-07-31 (×2): 1 g via INTRAVENOUS
  Filled 2013-07-31 (×4): qty 1

## 2013-07-31 MED ORDER — LIDOCAINE HCL (PF) 1 % IJ SOLN
INTRAMUSCULAR | Status: AC
  Filled 2013-07-31: qty 30

## 2013-07-31 MED ORDER — FENTANYL CITRATE 0.05 MG/ML IJ SOLN
INTRAMUSCULAR | Status: AC
  Filled 2013-07-31: qty 2

## 2013-07-31 MED ORDER — ASPIRIN 81 MG PO CHEW
CHEWABLE_TABLET | ORAL | Status: AC
  Filled 2013-07-31: qty 4

## 2013-07-31 MED ORDER — MORPHINE SULFATE 2 MG/ML IJ SOLN
2.0000 mg | INTRAMUSCULAR | Status: DC | PRN
  Administered 2013-07-31 (×3): 2 mg via INTRAVENOUS
  Filled 2013-07-31 (×3): qty 1

## 2013-07-31 MED ORDER — SODIUM CHLORIDE 0.9 % IV SOLN: INTRAVENOUS | Status: DC

## 2013-07-31 MED ORDER — CLOPIDOGREL BISULFATE 75 MG PO TABS: 75.0000 mg | ORAL_TABLET | Freq: Every day | ORAL | Status: DC

## 2013-07-31 MED ORDER — ASPIRIN EC 81 MG PO TBEC: 81.0000 mg | DELAYED_RELEASE_TABLET | Freq: Every day | ORAL | Status: DC

## 2013-07-31 MED ORDER — ADULT MULTIVITAMIN W/MINERALS CH
1.0000 | ORAL_TABLET | Freq: Every day | ORAL | Status: DC
  Administered 2013-07-31 – 2013-08-04 (×5): 1 via ORAL
  Filled 2013-07-31 (×5): qty 1

## 2013-07-31 MED ORDER — GI COCKTAIL ~~LOC~~
30.0000 mL | Freq: Three times a day (TID) | ORAL | Status: DC | PRN
  Administered 2013-07-31: 30 mL via ORAL
  Filled 2013-07-31 (×2): qty 30

## 2013-07-31 MED ORDER — SODIUM CHLORIDE 0.9 % IV SOLN: INTRAVENOUS | Status: AC

## 2013-07-31 MED ORDER — ASPIRIN 81 MG PO CHEW
324.0000 mg | CHEWABLE_TABLET | Freq: Once | ORAL | Status: AC
  Administered 2013-07-31: 324 mg via ORAL
  Filled 2013-07-31: qty 4

## 2013-07-31 MED ORDER — MORPHINE SULFATE 10 MG/ML IJ SOLN
INTRAMUSCULAR | Status: AC
  Filled 2013-07-31: qty 1

## 2013-07-31 MED ORDER — METOPROLOL TARTRATE 50 MG PO TABS
50.0000 mg | ORAL_TABLET | Freq: Two times a day (BID) | ORAL | Status: DC
  Administered 2013-07-31 – 2013-08-04 (×9): 50 mg via ORAL
  Filled 2013-07-31 (×10): qty 1

## 2013-07-31 MED ORDER — FUROSEMIDE 10 MG/ML IJ SOLN
40.0000 mg | Freq: Two times a day (BID) | INTRAMUSCULAR | Status: DC
  Administered 2013-07-31 – 2013-08-01 (×4): 40 mg via INTRAVENOUS
  Filled 2013-07-31 (×6): qty 4

## 2013-07-31 MED ORDER — PROMETHAZINE HCL 25 MG/ML IJ SOLN
12.5000 mg | Freq: Once | INTRAMUSCULAR | Status: AC
  Administered 2013-07-31: 12.5 mg via INTRAVENOUS
  Filled 2013-07-31 (×2): qty 1

## 2013-07-31 MED ORDER — EZETIMIBE-SIMVASTATIN 10-40 MG PO TABS
1.0000 | ORAL_TABLET | Freq: Every day | ORAL | Status: DC
  Administered 2013-07-31 – 2013-08-03 (×4): 1 via ORAL
  Filled 2013-07-31 (×5): qty 1

## 2013-07-31 MED ORDER — CLOPIDOGREL BISULFATE 75 MG PO TABS
75.0000 mg | ORAL_TABLET | Freq: Every day | ORAL | Status: DC
  Administered 2013-08-01 – 2013-08-04 (×4): 75 mg via ORAL
  Filled 2013-07-31 (×5): qty 1

## 2013-07-31 MED ORDER — VITAMIN B-1 100 MG PO TABS
100.0000 mg | ORAL_TABLET | Freq: Every day | ORAL | Status: DC
  Administered 2013-07-31 – 2013-08-03 (×4): 100 mg via ORAL
  Filled 2013-07-31 (×4): qty 1

## 2013-07-31 MED ORDER — PANTOPRAZOLE SODIUM 40 MG PO TBEC
40.0000 mg | DELAYED_RELEASE_TABLET | Freq: Every day | ORAL | Status: DC
  Administered 2013-07-31 – 2013-08-04 (×5): 40 mg via ORAL
  Filled 2013-07-31 (×5): qty 1

## 2013-07-31 MED ORDER — BENZONATATE 100 MG PO CAPS
100.0000 mg | ORAL_CAPSULE | Freq: Two times a day (BID) | ORAL | Status: DC | PRN
  Administered 2013-07-31: 100 mg via ORAL
  Filled 2013-07-31 (×2): qty 1

## 2013-07-31 MED ORDER — ASPIRIN EC 81 MG PO TBEC
81.0000 mg | DELAYED_RELEASE_TABLET | Freq: Every day | ORAL | Status: DC
  Administered 2013-08-01 – 2013-08-04 (×4): 81 mg via ORAL
  Filled 2013-07-31 (×4): qty 1

## 2013-07-31 MED ORDER — VANCOMYCIN HCL IN DEXTROSE 750-5 MG/150ML-% IV SOLN
750.0000 mg | Freq: Two times a day (BID) | INTRAVENOUS | Status: DC
  Administered 2013-07-31 – 2013-08-01 (×4): 750 mg via INTRAVENOUS
  Filled 2013-07-31 (×6): qty 150

## 2013-07-31 MED ORDER — METOPROLOL TARTRATE 1 MG/ML IV SOLN
INTRAVENOUS | Status: AC
  Filled 2013-07-31: qty 5

## 2013-07-31 MED ORDER — LORAZEPAM 1 MG PO TABS
1.0000 mg | ORAL_TABLET | Freq: Four times a day (QID) | ORAL | Status: DC | PRN
  Filled 2013-07-31: qty 1

## 2013-07-31 MED ORDER — NICOTINE 14 MG/24HR TD PT24
14.0000 mg | MEDICATED_PATCH | Freq: Every day | TRANSDERMAL | Status: DC
  Administered 2013-07-31 – 2013-08-04 (×5): 14 mg via TRANSDERMAL
  Filled 2013-07-31 (×5): qty 1

## 2013-07-31 MED ORDER — OXYCODONE-ACETAMINOPHEN 5-325 MG PO TABS
1.0000 | ORAL_TABLET | ORAL | Status: DC | PRN
Start: 2013-07-31 — End: 2013-08-04
  Administered 2013-07-31 (×2): 2 via ORAL
  Filled 2013-07-31 (×4): qty 2

## 2013-07-31 MED ORDER — HYDRALAZINE HCL 20 MG/ML IJ SOLN
10.0000 mg | Freq: Once | INTRAMUSCULAR | Status: AC
  Administered 2013-07-31: 10 mg via INTRAVENOUS
  Filled 2013-07-31: qty 1

## 2013-07-31 MED ORDER — LORAZEPAM 2 MG/ML IJ SOLN
1.0000 mg | Freq: Four times a day (QID) | INTRAMUSCULAR | Status: DC | PRN
  Administered 2013-07-31: 1 mg via INTRAVENOUS
  Filled 2013-07-31: qty 1

## 2013-07-31 MED ORDER — HEPARIN (PORCINE) IN NACL 2-0.9 UNIT/ML-% IJ SOLN
INTRAMUSCULAR | Status: AC
  Filled 2013-07-31: qty 1000

## 2013-07-31 MED ORDER — BIVALIRUDIN 250 MG IV SOLR
INTRAVENOUS | Status: AC
  Filled 2013-07-31: qty 250

## 2013-07-31 MED ORDER — NITROGLYCERIN 0.4 MG SL SUBL
0.4000 mg | SUBLINGUAL_TABLET | SUBLINGUAL | Status: DC | PRN
  Administered 2013-07-31 (×3): 0.4 mg via SUBLINGUAL
  Filled 2013-07-31 (×2): qty 25

## 2013-07-31 MED ORDER — POTASSIUM CHLORIDE CRYS ER 20 MEQ PO TBCR
40.0000 meq | EXTENDED_RELEASE_TABLET | Freq: Once | ORAL | Status: AC
  Administered 2013-07-31: 40 meq via ORAL
  Filled 2013-07-31: qty 2

## 2013-07-31 MED ORDER — BENAZEPRIL HCL 40 MG PO TABS
40.0000 mg | ORAL_TABLET | Freq: Every day | ORAL | Status: DC
  Administered 2013-07-31 – 2013-08-04 (×5): 40 mg via ORAL
  Filled 2013-07-31 (×5): qty 1

## 2013-07-31 MED ORDER — LORAZEPAM 1 MG PO TABS: 0.0000 mg | ORAL_TABLET | Freq: Two times a day (BID) | ORAL | Status: DC

## 2013-07-31 MED ORDER — FOLIC ACID 1 MG PO TABS
1.0000 mg | ORAL_TABLET | Freq: Every day | ORAL | Status: DC
  Administered 2013-07-31 – 2013-08-03 (×4): 1 mg via ORAL
  Filled 2013-07-31 (×4): qty 1

## 2013-07-31 MED ORDER — THIAMINE HCL 100 MG/ML IJ SOLN
100.0000 mg | Freq: Every day | INTRAMUSCULAR | Status: DC
  Filled 2013-07-31 (×4): qty 1

## 2013-07-31 MED ORDER — CLOPIDOGREL BISULFATE 300 MG PO TABS
600.0000 mg | ORAL_TABLET | Freq: Once | ORAL | Status: AC
  Administered 2013-07-31: 600 mg via ORAL
  Filled 2013-07-31: qty 2

## 2013-07-31 MED ORDER — LORAZEPAM 1 MG PO TABS
0.0000 mg | ORAL_TABLET | Freq: Four times a day (QID) | ORAL | Status: AC
  Administered 2013-07-31: 2 mg via ORAL
  Administered 2013-07-31: 1 mg via ORAL
  Administered 2013-08-01: 2 mg via ORAL
  Filled 2013-07-31 (×2): qty 2

## 2013-07-31 MED ORDER — ASPIRIN 81 MG PO CHEW: 324.0000 mg | CHEWABLE_TABLET | Freq: Once | ORAL | Status: DC

## 2013-07-31 MED ORDER — ACETAMINOPHEN 325 MG PO TABS: 650.0000 mg | ORAL_TABLET | ORAL | Status: DC | PRN

## 2013-07-31 MED ORDER — AMLODIPINE BESYLATE 10 MG PO TABS
10.0000 mg | ORAL_TABLET | Freq: Every day | ORAL | Status: DC
  Administered 2013-07-31 – 2013-08-04 (×5): 10 mg via ORAL
  Filled 2013-07-31 (×5): qty 1

## 2013-07-31 MED ORDER — BIOTENE DRY MOUTH MT LIQD
15.0000 mL | Freq: Two times a day (BID) | OROMUCOSAL | Status: DC
  Administered 2013-07-31 – 2013-08-04 (×9): 15 mL via OROMUCOSAL

## 2013-07-31 MED ORDER — EPTIFIBATIDE 75 MG/100ML IV SOLN
INTRAVENOUS | Status: AC
  Filled 2013-07-31: qty 100

## 2013-07-31 NOTE — Op Note (Signed)
NAME:  Steve, Durham NO.:  [id number]  MEDICAL RECORD NO.:  [id number]  LOCATION:                                 FACILITY:  PHYSICIAN:  Cindee Salt, M.D.       DATE OF BIRTH:  06-May-1946  DATE OF PROCEDURE:  07/30/2013 DATE OF DISCHARGE:                              OPERATIVE REPORT   PREOPERATIVE DIAGNOSIS:  Dupuytren's contracture, ring and little finger, right hand.  POSTOPERATIVE DIAGNOSIS:  Dupuytren's contracture, ring and little finger, right hand.  OPERATION:  Fasciotomy, ring and little fingers, right hand.  SURGEON:  Cindee Salt, M.D.  ANESTHESIA:  Supraclavicular block, general.  ANESTHESIOLOGIST:  Bedelia Person, M.D.  HISTORY:  The patient is a 67 year old male with a history of Dupuytren's contracture to the ring and little fingers of his right hand.  He has significant flexion deformities to the PIP joints, which are not changed with flexion of his metacarpophalangeal joints indicative of contracted PIP joint.  He is admitted now for fasciotomy with application of the digit widgets for ring and little fingers to correct the flexion deformity to the PIP and struck the contractures. He is aware of risks and complications including infection; recurrence of injury to arteries, nerves, tendons, incomplete relief of symptoms, dystrophy, even the possibility of loss of fingers, the necessity of a second procedure to remove the digit widget.  In the preoperative area, the patient is seen, the extremity marked by both patient and surgeon. Antibiotic given.  PROCEDURE:  The patient was brought to the operating room where a supraclavicular block general anesthetic were carried out without difficulty.  He was prepped using ChloraPrep, supine position with his right arm free.  A 3-minute dry time was allowed.  Time-out taken, confirming patient and procedure.  The limb was exsanguinated with an Esmarch bandage.  After exsanguination of the limb with an  Esmarch bandage and inflation of tourniquet to 250 mmHg on his upper arm from a proximal to distal direction, the little finger was approached first. The cord was transected proximally after making a transverse incision at the level of the flexor retinaculum and palmar fascia.  The cord was isolated proximally.  Retractors placed protecting neurovascular bundles and the cord was transected.  Second incision was then made through the middle finger and also to the ring and the cords were transected proximally to each one of these fingers.  A second incision was made over the metacarpophalangeal joint crease to the small finger, carried down identifying the cord retractors placed protecting neurovascular bundles.  The cord was transected at that level.  The ring finger was attended to next in the palm.  Two transverse incisions were made allowing release of cords from a middle to ring finger cord and a ring finger cord proper.  These each isolated the cord transecting these protecting neurovascular structures beneath.  The small finger was attended to next.  An incision made over the proximal phalanx radial border.  The cord was identified, neurovascular bundle identified on the radial side and the cord transected.  The PIP joint immediately came straight on transecting the cord at that level.  It was decided not  to proceed with application of the digit widget on that digit.  A separate incision was then made today abductor digiti quinti cord on the ulnar aspect.  The cord identified.  Neurovascular bundles were protected and the cord transected at the level just distal to the metacarpophalangeal joint.  The PIP joint was fully straight along with metacarpophalangeal joint.  The ring finger cord was attended to next.  A very large thick cord was present on the ulnar aspect of the proximal phalanx.  This was opened between 2 pits.  A very thick cord was identified.  The neurovascular bundle  appeared to lie deep to it, it could not easily be identified.  With very curved dissection, the cord was transected with sharp dissection.  The neurovascular bundle was finally identified, this was retracted out of the way and the cord transection completed and amazingly the PIP joint 70 degree flexion contracture to the PIP joint came entirely straight.  As such, it was decided not to proceed with application of the digit widget on that digit either.  Wounds were copiously irrigated with saline.  The neurovascular bundles were checked in each of the wounds, found to be intact.  The wounds were closed as much as possible with interrupted 4-0 Vicryl sutures after irrigation. A sterile compressive dressing applied.  The tourniquet deflated.  All fingers immediately pinked.  On completion of the dorsal splint, bulky dressing was applied.  The patient tolerated the procedure well and was taken to the recovery room for observation in satisfactory condition. He will be discharged home to return in 1 week on Norco.          ______________________________ Cindee Salt, M.D.     GK/MEDQ  D:  07/30/2013  T:  07/31/2013  Job:  [id number]

## 2013-07-31 NOTE — Progress Notes (Signed)
ANTIBIOTIC CONSULT NOTE - INITIAL  Pharmacy Consult for Vancomycin, Cefepime Indication: pneumonia  No Known Allergies  Weight = 65 kg  Labs:  Recent Labs  07/30/13 1219  HGB 16.0  CREATININE 0.80   The CrCl is unknown because both a height and weight (above a minimum accepted value) are required for this calculation. No results found for this basename: VANCOTROUGH, VANCOPEAK, VANCORANDOM, GENTTROUGH, GENTPEAK, GENTRANDOM, TOBRATROUGH, TOBRAPEAK, TOBRARND, AMIKACINPEAK, AMIKACINTROU, AMIKACIN,  in the last 72 hours   Microbiology: No results found for this or any previous visit (from the past 720 hour(s)).  Medical History: Past Medical History  Diagnosis Date  . Hypertension   . Hyperlipoproteinemia   . Aortic valve disorders     a. Mod AS/AI by cath 07/2012.  . Back pain   . Carotid artery occlusion     a. Dopplers 07/2012: no significant high grade obstruction.  . Hyperlipidemia   . Abnormal CT scan, kidney     07/2012 - multiple small cysts  . Cholelithiasis     Seen on CT 07/2012  . Coronary artery disease     a. CABG 10/1991. b. cath 07/2012 demonstrating occluded SVGs to OM & RCA; segmental LMCA & OM dz, patent IMA w/ insertional dz, moderate AI/AS -- for med rx for now.    Assessment: 67 year old gentleman admitted as a code Stemi, now s/p urgent cath, also with persistent cough and recent ambulatory surgery (open fasciotomy) now beginning broad spectrum antibiotics for pneumonia.  Current smoker as well.  Scr = 0.8  Goal of Therapy:  Vancomycin trough level 15-20 mcg/ml Appropriate cefepime dosing  Plan:  1) Cefepime 1 Gram iv Q 12 hours 2) Vancomycin 750 mg iv Q 12 hours 3) Follow up progress, Scr, fever curve, cultures  Thank you. Okey Regal, PharmD [phone]  07/31/2013,10:29 AM

## 2013-07-31 NOTE — Progress Notes (Signed)
Cath lab Onalee Hua and Onalee Hua) pulled pt right femoral sheath at 1545 after Angiomax off for 2 hours.  Site prior to sheath removal level 0. Manual pressure held for 20 minutes and pressure dressing applied.  Pedal pulses 3+ bilaterally.  Instructions given to pt to hold groin if have to cough, sneeze, or laugh.  Pt stated understanding and did as directed.  Pt coughed at 1615, and groin rebled.  Manual pressure applied immediately and held for 15 minutes.  Small, dime-size hematoma expressed.  Site level 0 at present. Instructions given to pt again.  Call bell in reach.  Small amount of blood noted to pt right hand dressing from surgery yesterday.  This blood is from pt holding groin and not hand.  Pt aware and hand MD RN at bedside and assessed.  Will continue to monitor closely.

## 2013-07-31 NOTE — CV Procedure (Signed)
Cardiac Catheterization Operative Report  Steve Durham [id number] 10/14/20149:41 AM No primary provider on file.  Procedure Performed:  1. Left Heart Catheterization 2. Selective Coronary Angiography 3. Aortic root angiography 4. LIMA graft angiography  Operator: Verne Carrow, MD  Indication:  67 yo male with history of severe CAD s/p CABG, last cath 2013 with 1/3 patent bypass grafts. Pt with moderate to severe disease LM into Circumflex, severe disease anastomosis of IMA graft to LAD. He was turned down for repeat CABG due to calcified aorta. He has been medically managed over the last year. He underwent fasciotomy of right hand yesterday to release a contracture. Onset of severe chest pain this am around 4am. ST depression on EKG. Urgent cath to exclude CAD.                                     Procedure Details: The risks, benefits, complications, treatment options, and expected outcomes were discussed with the patient. Emergency consent obtained. The patient was further sedated with Versed and Fentanyl. The right groin was prepped and draped in the usual manner. Using the modified Seldinger access technique, a 6 French sheath was placed in the right femoral artery. Standard diagnostic catheters were used to perform selective coronary angiography. The patient was found to have severe, hazy stenosis in the mid LAD just beyond the anastomosis of the IMA graft. This has progressed from last cath. Decision was made to proceed with PCI of the LAD through the LIMA graft.   PCI Note: Pt given Angiomax bolus and drip started. We attempted to give oral ASA but he vomited. Decision made to use Integrilin for anti-platelet therapy as he could not tolerate oral meds. He was given a single bolus of Integrilin. I engaged the LIMA graft with a IMA guide. When the ACT was over 200, I passed a Cougar IC wire down the LIMA graft into the LAD. I then pre-dilated the stenosis in the mid LAD  with a 2.0 x 12 mm balloon x 1. I then carefully positioned and deployed a 2.25 x 20 mm Promus Premier DES in the mid LAD extending back into the IMA graft. There was an excellent result. The stenosis was taken from 99% down to 0%. There was good flow into the distal target vessel.   At this point, the patient continued to have severe chest pain out of proportion to his coronary disease. An aortic root angiogram was performed. The aortic root was heavily calcified with a subtle filling defect above the right coronary ostium. The pigtail catheter was advanced into the LV for pressures. No LV gram was performed.    There were no immediate complications. The patient was taken to the recovery area in stable condition.   Hemodynamic Findings: Central aortic pressure: 135/70 Left ventricular pressure: 136/19/31  Angiographic Findings:  Left main: The vessel is diffusely diseased. The mid vessel has diffuse 60% stenosis and tapers down to a 70% stenosis just before the bifurcation.   Left Anterior Descending Artery: 100% proximal occlusion. The mid and distal vessel fills from the patent IMA graft. The mid LAD has a 99% stenosis after the anastomosis of the graft. There are several diagonal branches. The first diagonal fills from left to left collaterals. The second diagonal fills from the IMA graft.   Circumflex Artery: Large caliber vessel with large obtuse marginal branch. There is diffuse disease in  the circumflex extending down into the mid segment of the obtuse marginal branch. The proximal Circumflex has diffuse 50% stenosis. The obtuse marginal branch has a stented segment. There is diffuse 70% stent restenosis. This appears stable from last cath.   Right Coronary Artery: 100% proximal occlusion. The mid and distal vessel is seen to fill from left to right collaterals.   Graft Anatomy:  SVG to PDA is known to be occluded and was not selectively engaged.  SVG to OM is known to be occluded and  was not selectively engaged.  LIMA to mid LAD is patent. There is a 99% stenosis in the mid LAD just beyond the graft insertion site.   Left Ventricular Angiogram: Deferred.   Aortic root angiogram: No dilatation. Heavy calcification noted. No obvious dissection. Small filling defect above right coronary artery.   Impression: 1. Severe triple vessel CAD s/p 3V CABG with 1/3 patent bypass grafts.  2. Severe stenosis mid LAD beyond graft insertion site 3. Unstable angina 4. Successful PTCA/DES x 1 mid LAD (through LIMA graft) 5. No obvious aortic dissection (confirmed by STAT CTA post cath)  Recommendations: Will load with Plavix 600 mg po x 1 and ASA 324 mg po x 1 after the STAT CTA chest once dissection excluded. Continue beta blocker, statin. CTA chest with pneumonia and interstitial fluid. Will diurese and treat for pneumonia. Echo today.        Complications:  None. The patient tolerated the procedure well.

## 2013-07-31 NOTE — Progress Notes (Signed)
Utilization Review Completed.Steve Durham T10/14/2014  

## 2013-07-31 NOTE — Progress Notes (Signed)
Pt is now s/p PtCA/DES x LAD beyond graft insertion (PCI through LIMA graft). STAT CTA chest did not show aortic dissection or PE. Will load with Plavix 600 mg po x 1 and start 75 mg tomorrow. Load ASA 324 mg po x 1 (he vomited after receiving in cath lab). CT chest shows probable pneumonia and interstitial fluid. Will diurese today and will begin empiric antibiotic therapy for possible pneumonia. Echo today to assess LVEF and exclude pericardial effusion. Cycle cardiac markers.   MCALHANY,CHRISTOPHER 11:05 AM 07/31/2013

## 2013-07-31 NOTE — Progress Notes (Signed)
CRITICAL VALUE ALERT  Critical value received:  Cardiac enz +  Date of notification:  07/31/13  Time of notification:  1204  Critical value read back:yes  Nurse who received alert:  Doy Mince, RN  MD notified (1st page):  Ward Givens, NP  Time of first page:  1206  MD notified (2nd page):  Time of second page:  Responding MD:  Ward Givens, NP  Time MD responded:  [phone]

## 2013-07-31 NOTE — H&P (Signed)
History and Physical  Patient ID: Steve Durham MRN: [id number], DOB: August 05, 1946 Date of Encounter: 07/31/2013, 8:47 AM Primary Physician: No primary provider on file. Primary Cardiologist: Clifton James  Chief Complaint: chest pain Reason for Admission: STEMI  HPI: Steve Durham is a 67 y/o M with history of CAD s/p CABG 1993 (cath 07/2012 -> poor candidate for redo CABG, for med rx), mod AS/AI, HTN, HLD who presented to [facility] today with chest pain and EKG concerning for STEMI. He underwent fasciotomy of his right ring and little fingers for Dupuytren's contracture yesterday and went home feeling well. (Advised to f/u 1 week so ~08/06/13.) He was awakened at 4am this morning with 10/10 substernal chest pain radiating to his left neck associated with SOB, nausea, and vomiting. He states he's "always SOB." He took 4 SL NTG at home which helped ease the pain, but as it came back, he called EMS. He was given 2 additional SL NTG. EKG demonstrated ST elevation in lead III, avF, with marked diffuse ST depression. He was transported directly to the cath lab. He received 324mg  ASA in the cath lab. He is still having 6/10 chest pain and nausea. He denies any fevers, chills, abdominal pain, or recent bleeding. He did not take his regular medicines yet today.  Past Medical History  Diagnosis Date  . Hypertension   . Hyperlipoproteinemia   . Aortic valve disorders     a. Mod AS/AI by cath 07/2012.  . Back pain   . Carotid artery occlusion     a. Dopplers 07/2012: no significant high grade obstruction.  . Hyperlipidemia   . Abnormal CT scan, kidney     07/2012 - multiple small cysts  . Cholelithiasis     Seen on CT 07/2012  . Coronary artery disease     a. CABG 10/1991. b. cath 07/2012 demonstrating occluded SVGs to OM & RCA; segmental LMCA & OM dz, patent IMA w/ insertional dz, moderate AI/AS -- for med rx for now.     Most Recent Cardiac Studies: Cardiac cath 08/09/13:  Left mainstem:  Heavily calcified. There is diffuse 70% narrowing in the LMCA. It is diseased to the take off of the large OM  Left anterior descending (LAD): Occluded after the septal  Left circumflex (LCx): Diffuse 70-80% segmental disease leading to a very large OM. This collateralizes multiple vessels including the PDA. It also retrograde fills a diagonal or ramus branch  Right coronary artery (RCA): 90% ostial and totally occluded.  SVG to the PDA PLA is occluded  SVG to the OM is occluded.  LIMA to the diagonal and LAD is intact but there is 70-80% narrowing near the LAD insertion. The LAD also collateralizes multiple vessels  Left ventriculography: Left ventricular systolic function is reduced, with inferobasal hypokinesis. EF estimate is 45%-50%.  Aortic root: Calcified leaflets with moderate AI.  Final Conclusions:  1. Occluded SVGs to the OM and RCA system.  2. Segmental LMCA and OM disease supplying large system.  3. Patent IMA with touchdown disease at insertion.  4. Moderate AI, AS   CTA chest October 2013:  1. Advanced atherosclerotic calcification involving the thoracic and abdominal aorta. No focal aneurysm. 2. Advanced atherosclerotic calcifications involving the coronary arteries and aortic branch vessels. 3. No mediastinal or hilar mass or adenopathy. For no acute pulmonary findings. Minimal basilar scarring changes. 4. Multiple simple and hyperdense/hemorrhagic renal cysts. 5. Cholelithiasis.   Echo 08/10/12:  Left ventricle: The cavity size was normal.  Wall thickness was normal. Systolic function was normal. The estimated ejection fraction was in the range of 55% to 60%. - Aortic valve: Moderately calcified annulus. There was mild stenosis. Valve area: 1.45cm^2(VTI). Valve area: 1.71cm^2 (Vmax). Valve area: 1.45cm^2 (Vmean).    Surgical History:  Past Surgical History  Procedure Laterality Date  . Cardiac catheterization  04/03/99  . Carpal tunnel release  2007    Excision  mass dorsal left wrist  . Hernia repair  1988  . Coronary artery bypass graft  1993     Home Meds: Prior to Admission medications   Medication Sig Start Date End Date Taking? Authorizing Provider  amLODipine-benazepril (LOTREL) 5-20 MG per capsule Take 2 capsules by mouth daily. 07/11/13   Kathleene Hazel, MD  aspirin EC 81 MG EC tablet Take 1 tablet (81 mg total) by mouth daily. 08/15/12   Dayna N Dunn, Steve Durham  ezetimibe-simvastatin (VYTORIN) 10-40 MG per tablet Take 1 tablet by mouth at bedtime. 07/11/13   Kathleene Hazel, MD  fexofenadine (ALLEGRA) 180 MG tablet Take 180 mg by mouth daily as needed. For allergies    Historical Provider, MD  HYDROcodone-acetaminophen (NORCO) 5-325 MG per tablet Take 1 tablet by mouth every 6 (six) hours as needed for pain. 07/30/13   Nicki Reaper, MD  isosorbide mononitrate (IMDUR) 30 MG 24 hr tablet Take 1 tablet (30 mg total) by mouth daily. 07/11/13   Kathleene Hazel, MD  metoprolol tartrate (LOPRESSOR) 50 MG tablet Take 1 tablet (50 mg total) by mouth 2 (two) times daily. 07/11/13   Kathleene Hazel, MD  nitroGLYCERIN (NITROSTAT) 0.4 MG SL tablet Place 1 tablet (0.4 mg total) under the tongue every 5 (five) minutes as needed for chest pain (up to 3 doses). 07/11/13   Kathleene Hazel, MD  Omega-3 Fatty Acids (FISH OIL) 1000 MG CAPS Take 1,000 mg by mouth 2 (two) times daily.      Historical Provider, MD  omeprazole (PRILOSEC) 40 MG capsule Take 40 mg by mouth daily.    Historical Provider, MD    Allergies: No Known Allergies  History   Social History  . Marital Status: Married    Spouse Name: N/A    Number of Children: 1  . Years of Education: N/A   Occupational History  . Plumbing     Retired in 2007   Social History Main Topics  . Smoking status: Current Some Day Smoker -- 0.50 packs/day for 50 years  . Smokeless tobacco: Not on file     Comment: has cut back   . Alcohol Use: 8.4 oz/week    14 Cans of beer per  week     Comment: daily-6 daily  . Drug Use: No  . Sexual Activity: Not on file   Other Topics Concern  . Not on file   Social History Narrative  . No narrative on file     Family History  Problem Relation Age of Onset  . Heart attack Mother     MI  . Hypertension Sister   . Emphysema Brother     Review of Systems: General: negative for chills, fever.  Cardiovascular: see above. Denies edema, palpitations. Respiratory: negative for cough  Urologic: negative for hematuria Abdominal: negative for bright red blood per rectum, melena, or hematemesis Neurologic: negative for visual changes, syncope, or dizziness All other systems reviewed and are otherwise negative except as noted above.  Labs:   Lab Results  Component Value Date  WBC 11.3* 08/10/2012   HGB 16.0 07/30/2013   HCT 47.0 07/30/2013   MCV 97.5 08/10/2012   PLT 205 08/10/2012     Recent Labs Lab 07/30/13 1219  NA 139  K 3.8  CL 105  BUN 17  CREATININE 0.80  GLUCOSE 102*    Lab Results  Component Value Date   CHOL 129 08/10/2012   HDL 53 08/10/2012   LDLCALC 58 08/10/2012   TRIG 88 08/10/2012   Radiology/Studies:  No results found.   EKG: sinus tach 106bpm ST elevation in III, avF (pt has NSIVCD so difficult to quantify), marked ST depression V2-V6 with downsloping T's V5-V6, ST depression also present I, II, avL  Physical Exam: P 115, BP 198/119, POX 100 2L, RR 16 General: Well developed, well nourished, uncomfortable appearing WM Head: Normocephalic, atraumatic, sclera non-icteric, no xanthomas, nares are without discharge.  Neck: JVD not elevated. Lungs: Clear bilaterally to auscultation without wheezes, rales, or rhonchi. Breathing is unlabored. Heart: RRR with S1 S2. 2/6 SEM. No rubs or gallops appreciated. Abdomen: Soft, non-tender, non-distended with normoactive bowel sounds. No hepatomegaly. No rebound/guarding. No obvious abdominal masses. Msk:  Tone appears normal for  age. Extremities: No clubbing or cyanosis. No edema.  Distal pedal pulses in tact bilaterally. R hand splint in place. Neuro: Alert and oriented X 3. No focal deficit. No facial asymmetry. Moves all extremities spontaneously. Back: Skin appears mottled. Psych:  Responds to questions appropriately with a normal affect.    ASSESSMENT AND PLAN:  1. Acute STEMI / known CAD s/p remote CABG 1993 2. HTN 3. Moderate AS/AI by cath 07/2012 4. Hyperlipidemia 5. Tobacco abuse 6. History of habitual alcohol use 7. Dupuytren's contracture s/p fasciotomy POD #1  The patient was taken emergently to the cath lab and will undergo cardiac cath. Dr. Clifton James has also spoken with Dr. Merrilee Seashore office (hand surgeon) so they are aware of the patient's situation. He has asked that someone from their office come by later today to assess his surgical site given use of blood thinning agents. Will discuss med regimen with MD post-cath. Will put CIWA protocol into place to monitor for withdrawal as well.  Signed, Steve Spies Steve Durham 07/31/2013, 8:47 AM  I have personally seen and examined this patient with Steve Spies, Steve Durham. I agree with the assessment and plan as outlined above. Emergent cath today to exclude obstructive CAD/aortic dissection. Further plans to follow.   Steve Durham 11:01 AM 07/31/2013

## 2013-07-31 NOTE — ED Notes (Signed)
This patient arrived via EMS with concerns for chest pain, diaphoresis that began several hours prior to arrival. Patient EMS EKG had T-wave inversions, ST depressions in the anterior leads.  No prior EKG was available.  With these seemingly new changes, the patient's clinical condition, he arrived as a code STEMI.  Patient had medical screening on arrival. Air was intact, patient was awake and alert, though he remained uncomfortable appearing.  After discussion with EMS, cardiology advanced care practitioners, the patient was transferred to the catheterization lab.  Gerhard Munch, MD 07/31/13 [phone]

## 2013-07-31 NOTE — Progress Notes (Signed)
  Echocardiogram 2D Echocardiogram has been performed.  Steve Durham 07/31/2013, 3:07 PM

## 2013-08-01 ENCOUNTER — Inpatient Hospital Stay (HOSPITAL_COMMUNITY): Payer: Medicare Other

## 2013-08-01 DIAGNOSIS — I359 Nonrheumatic aortic valve disorder, unspecified: Secondary | ICD-10-CM | POA: Diagnosis not present

## 2013-08-01 DIAGNOSIS — I10 Essential (primary) hypertension: Secondary | ICD-10-CM

## 2013-08-01 DIAGNOSIS — I214 Non-ST elevation (NSTEMI) myocardial infarction: Secondary | ICD-10-CM | POA: Diagnosis not present

## 2013-08-01 DIAGNOSIS — I2582 Chronic total occlusion of coronary artery: Secondary | ICD-10-CM | POA: Diagnosis not present

## 2013-08-01 DIAGNOSIS — R0602 Shortness of breath: Secondary | ICD-10-CM | POA: Diagnosis not present

## 2013-08-01 DIAGNOSIS — M549 Dorsalgia, unspecified: Secondary | ICD-10-CM

## 2013-08-01 DIAGNOSIS — J690 Pneumonitis due to inhalation of food and vomit: Secondary | ICD-10-CM | POA: Diagnosis not present

## 2013-08-01 DIAGNOSIS — I25810 Atherosclerosis of coronary artery bypass graft(s) without angina pectoris: Secondary | ICD-10-CM

## 2013-08-01 DIAGNOSIS — F1010 Alcohol abuse, uncomplicated: Secondary | ICD-10-CM

## 2013-08-01 DIAGNOSIS — I2510 Atherosclerotic heart disease of native coronary artery without angina pectoris: Secondary | ICD-10-CM

## 2013-08-01 DIAGNOSIS — J189 Pneumonia, unspecified organism: Secondary | ICD-10-CM | POA: Diagnosis present

## 2013-08-01 LAB — CBC
HCT: 52.5 % — ABNORMAL HIGH (ref 39.0–52.0)
Hemoglobin: 18 g/dL — ABNORMAL HIGH (ref 13.0–17.0)
MCV: 97.6 fL (ref 78.0–100.0)
RBC: 5.38 MIL/uL (ref 4.22–5.81)
RDW: 13.3 % (ref 11.5–15.5)
WBC: 30.1 10*3/uL — ABNORMAL HIGH (ref 4.0–10.5)

## 2013-08-01 LAB — BASIC METABOLIC PANEL
BUN: 16 mg/dL (ref 6–23)
BUN: 17 mg/dL (ref 6–23)
CO2: 30 mEq/L (ref 19–32)
Calcium: 9.3 mg/dL (ref 8.4–10.5)
Chloride: 95 mEq/L — ABNORMAL LOW (ref 96–112)
Creatinine, Ser: 0.85 mg/dL (ref 0.50–1.35)
GFR calc Af Amer: >90 mL/min (ref >90)
GFR calc Af Amer: >90 mL/min (ref >90)
Glucose, Bld: 106 mg/dL — ABNORMAL HIGH (ref 70–99)
Glucose, Bld: 114 mg/dL — ABNORMAL HIGH (ref 70–99)
Potassium: 3.8 mEq/L (ref 3.5–5.1)
Sodium: 132 mEq/L — ABNORMAL LOW (ref 135–145)

## 2013-08-01 LAB — LIPID PANEL
Cholesterol: 161 mg/dL (ref 0–200)
HDL: 65 mg/dL (ref >39)
LDL Cholesterol: 73 mg/dL (ref 0–99)
Triglycerides: 116 mg/dL (ref <150)

## 2013-08-01 LAB — STREP PNEUMONIAE URINARY ANTIGEN: Strep Pneumo Urinary Antigen: NEGATIVE

## 2013-08-01 LAB — TROPONIN I: Troponin I: 7.48 ng/mL (ref <0.30)

## 2013-08-01 MED ORDER — PIPERACILLIN-TAZOBACTAM 3.375 G IVPB
3.3750 g | Freq: Three times a day (TID) | INTRAVENOUS | Status: DC
  Administered 2013-08-01 – 2013-08-02 (×3): 3.375 g via INTRAVENOUS
  Filled 2013-08-01 (×5): qty 50

## 2013-08-01 MED ORDER — PROMETHAZINE HCL 25 MG/ML IJ SOLN: 12.5000 mg | Freq: Four times a day (QID) | INTRAMUSCULAR | Status: DC | PRN

## 2013-08-01 MED FILL — Sodium Chloride IV Soln 0.9%: INTRAVENOUS | Qty: 50 | Status: AC

## 2013-08-01 NOTE — Progress Notes (Signed)
ANTIBIOTIC CONSULT NOTE - INITIAL  Pharmacy Consult for Zosyn Indication: pneumonia  No Known Allergies  Patient Measurements: Height: 5\' 5"  (165.1 cm) Weight: 139 lb 15.9 oz (63.5 kg) IBW/kg (Calculated) : 61.5 Adjusted Body Weight: n/a  Vital Signs: Temp: 97.6 F (36.4 C) (10/15 0800) Temp src: Oral (10/15 0800) BP: 119/73 mmHg (10/15 1028) Pulse Rate: 70 (10/15 1028) Intake/Output from previous day: 10/14 0701 - 10/15 0700 In: 1246.1 [P.O.:600; I.V.:446.1; IV Piggyback:200] Out: 3075 [Urine:3075] Intake/Output from this shift: Total I/O In: 120 [P.O.:120] Out: 200 [Urine:200]  Labs:  Recent Labs  07/31/13 0904 07/31/13 1053 08/01/13 0425 08/01/13 0800  WBC  --  31.0* 30.1*  --   HGB 18.4* 16.7 18.0*  --   PLT  --  261 224  --   CREATININE 0.90 0.75 0.75 0.85   Estimated Creatinine Clearance: 71.3 ml/min (by C-G formula based on Cr of 0.85). No results found for this basename: VANCOTROUGH, Leodis Binet, VANCORANDOM, GENTTROUGH, GENTPEAK, GENTRANDOM, TOBRATROUGH, TOBRAPEAK, TOBRARND, AMIKACINPEAK, AMIKACINTROU, AMIKACIN,  in the last 72 hours   Microbiology: Recent Results (from the past 720 hour(s))  MRSA PCR SCREENING     Status: None   Collection Time    07/31/13 10:27 AM      Result Value Range Status   MRSA by PCR NEGATIVE  NEGATIVE Final   Comment:            The GeneXpert MRSA Assay (FDA     approved for NASAL specimens     only), is one component of a     comprehensive MRSA colonization     surveillance program. It is not     intended to diagnose MRSA     infection nor to guide or     monitor treatment for     MRSA infections.    Medical History: Past Medical History  Diagnosis Date  . Hypertension   . Hyperlipoproteinemia   . Aortic valve disorders     a. Mod AS/AI by cath 07/2012.  . Back pain   . Carotid artery occlusion     a. Dopplers 07/2012: no significant high grade obstruction.  . Hyperlipidemia   . Abnormal CT scan, kidney    07/2012 - multiple small cysts  . Cholelithiasis     Seen on CT 07/2012  . Coronary artery disease     a. CABG 10/1991. b. cath 07/2012 demonstrating occluded SVGs to OM & RCA; segmental LMCA & OM dz, patent IMA w/ insertional dz, moderate AI/AS -- for med rx for now.    Medications:  Scheduled:  . amLODipine  10 mg Oral Daily  . antiseptic oral rinse  15 mL Mouth Rinse BID  . aspirin EC  81 mg Oral Daily  . benazepril  40 mg Oral Daily  . clopidogrel  75 mg Oral Q breakfast  . ezetimibe-simvastatin  1 tablet Oral QHS  . folic acid  1 mg Oral Daily  . furosemide  40 mg Intravenous Q12H  . LORazepam  0-4 mg Oral Q6H   Followed by  . [START ON 08/02/2013] LORazepam  0-4 mg Oral Q12H  . metoprolol tartrate  50 mg Oral BID  . multivitamin with minerals  1 tablet Oral Daily  . nicotine  14 mg Transdermal Daily  . pantoprazole  40 mg Oral Daily  . thiamine  100 mg Oral Daily   Or  . thiamine  100 mg Intravenous Daily  . vancomycin  750 mg Intravenous Q12H  Assessment: 67 yo male admitted 07/31/2013 with ACS and severe chest pain/cough.  Possible aspiration pneumonitis vs. Asymmetric edema.  Pharmacy asked to change cefepime to Zosyn.  Vancomycin continues.  Scr 0.85, estimated CrCl ~ 70 ml/min.  Goal of Therapy:  Resolution of PNA  Plan:  1. Zosyn 3.375g IV q 8 hrs (extended-interval infusion). 2. F/U cultures, clinical course and renal function.  Tad Moore, BCPS  Clinical Pharmacist Pager [phone]  08/01/2013 11:37 AM

## 2013-08-01 NOTE — Consult Note (Signed)
PULMONARY  / CRITICAL CARE MEDICINE  Name: Steve Durham MRN: [id number] DOB: [birth year]    ADMISSION DATE:  07/31/2013 CONSULTATION DATE:  10/15  REFERRING MD :  Clifton James  PRIMARY SERVICE: cardiology   CHIEF COMPLAINT:  Possible PNA   BRIEF PATIENT DESCRIPTION:  This is a 67 year old male who was admitted on 10/14 for STEMI s/p fasciotomy of his right ring and little fingers for Dupuytren's contracture 10/13. He had PtCA/DES to LAD. On 10/15 had cough, leukocytosis and R>L airspace disease. PCCM asked to see for possible PNA  SIGNIFICANT EVENTS / STUDIES:  CT chest 10/14: No evidence of aortic aneurysm or dissection. No evidence of pulmonary embolus. Aorta is diffusely calcified with irregular plaque in the distal thoracic aorta and visualized upper abdominal aorta.  Bilateral predominantly dependent ground-glass opacities and interstitial thickening. Findings could reflect asymmetric edema or infection. Trace right pleural effusion. Left heart cath 10/14: PtCa and stent to LAD Echo 10/14:>>>The cavity size was normal. Systolicfunction was normal. The estimated ejection fraction was in the range of 55% to 60%. Wall motion was normal; there were no regional wall motion abnormalities. - Aortic valve: There was mild stenosis  LINES / TUBES:   CULTURES:   ANTIBIOTICS: vanc 10/14>>> maxipime 101/4>>>10/15 Zosyn 10/15>>>  HISTORY OF PRESENT ILLNESS:   This is a 50 yom w/ marked h/o CAD, deemed not a candidate for CABG re-do back in 2013. Underwent fasciotomy of his right ring and little fingers for Dupuytren's contracture 10/13. Was released to home feeling well. At 4 am the am of 10/14 was awoken from sleep with 10/10 chest pain, radiated to left neck and associated with SOB, and nausea. Took SL NTG which helped at home. EMS called. On arrival to ER, had ST elevation in lead III and avF. He was admitted for eval and treatment of STEMI. He was brought to the cath lab. Had PtCA/DES to  LAD. CT chest had been obtained to eval for aortic dissection and PE. This was negative. Incidentally it showed bilateral interstitial airspace disease. In the ICU on 10/14 he did endorse non-productive cough and had marked leukocytosis. PCCM asked to see given concern for possible PNA.    PAST MEDICAL HISTORY :  Past Medical History  Diagnosis Date  . Hypertension   . Hyperlipoproteinemia   . Aortic valve disorders     a. Mod AS/AI by cath 07/2012.  . Back pain   . Carotid artery occlusion     a. Dopplers 07/2012: no significant high grade obstruction.  . Hyperlipidemia   . Abnormal CT scan, kidney     07/2012 - multiple small cysts  . Cholelithiasis     Seen on CT 07/2012  . Coronary artery disease     a. CABG 10/1991. b. cath 07/2012 demonstrating occluded SVGs to OM & RCA; segmental LMCA & OM dz, patent IMA w/ insertional dz, moderate AI/AS -- for med rx for now.   Past Surgical History  Procedure Laterality Date  . Cardiac catheterization  04/03/99  . Carpal tunnel release  2007    Excision mass dorsal left wrist  . Hernia repair  1988  . Coronary artery bypass graft  1993  . Fasciotomy Right 07/30/2013    Procedure: OPEN FASCIOTOMY RIGHT RING FINGER & RIGHT SMALL FINGER MULTIPLE LEVELS;  Surgeon: Nicki Reaper, MD;  Location: [facility];  Service: Orthopedics;  Laterality: Right;   Prior to Admission medications   Medication Sig Start Date End  Date Taking? Authorizing Provider  amLODipine-benazepril (LOTREL) 5-20 MG per capsule Take 2 capsules by mouth daily. 07/11/13  Yes Kathleene Hazel, MD  aspirin EC 81 MG EC tablet Take 1 tablet (81 mg total) by mouth daily. 08/15/12  Yes Dayna N Dunn, PA-C  ezetimibe-simvastatin (VYTORIN) 10-40 MG per tablet Take 1 tablet by mouth at bedtime. 07/11/13  Yes Kathleene Hazel, MD  fexofenadine (ALLEGRA) 180 MG tablet Take 180 mg by mouth daily as needed. For allergies   Yes Historical Provider, MD   HYDROcodone-acetaminophen (NORCO) 5-325 MG per tablet Take 1 tablet by mouth every 6 (six) hours as needed for pain. 07/30/13  Yes Nicki Reaper, MD  isosorbide mononitrate (IMDUR) 30 MG 24 hr tablet Take 1 tablet (30 mg total) by mouth daily. 07/11/13  Yes Kathleene Hazel, MD  metoprolol tartrate (LOPRESSOR) 25 MG tablet Take 50 mg by mouth 2 (two) times daily.    Yes Historical Provider, MD  nitroGLYCERIN (NITROSTAT) 0.4 MG SL tablet Place 1 tablet (0.4 mg total) under the tongue every 5 (five) minutes as needed for chest pain (up to 3 doses). 07/11/13  Yes Kathleene Hazel, MD  Omega-3 Fatty Acids (FISH OIL) 1000 MG CAPS Take 1,000 mg by mouth 2 (two) times daily.     Yes Historical Provider, MD  omeprazole (PRILOSEC) 40 MG capsule Take 40 mg by mouth daily.   Yes Historical Provider, MD   No Known Allergies  FAMILY HISTORY:  Family History  Problem Relation Age of Onset  . Heart attack Mother     MI  . Hypertension Sister   . Emphysema Brother    SOCIAL HISTORY:  reports that he has been smoking.  He does not have any smokeless tobacco history on file. He reports that he drinks about 8.4 ounces of alcohol per week. He reports that he does not use illicit drugs.  REVIEW OF SYSTEMS:    Constitutional: No weight loss, gain, night sweats, Fevers, chills, fatigue .  HEENT: No headaches, visual changes, Difficulty swallowing, Tooth/dental problems, or Sore throat,  No sneezing, itching, ear ache, nasal congestion, post nasal drip, no visual complaints CV: No chest pai, now resolved, Orthopnea, PND, swelling in lower extremities, dizziness, palpitations, syncope.  GI No heartburn, chronic indigestion, abdominal pain, nausea, vomiting, diarrhea, change in bowel habits, loss of appetite, bloody stools.  Resp: No cough, non-productive since admit only. No coughing up of blood. No change in color of mucus. No wheezing.  Skin: no rash or itching or icterus GU: no dysuria, change in  color of urine, no urgency or frequency. No flank pain, no hematuria  MS: No joint pain or swelling. No decreased range of motion  Psych: No change in mood or affect. No depression or anxiety.  Neuro: no difficulty with speech, weakness, numbness, ataxia   SUBJECTIVE:  No distress  VITAL SIGNS: Temp:  [97.6 F (36.4 C)-98.5 F (36.9 C)] 97.6 F (36.4 C) (10/15 0800) Pulse Rate:  [70-106] 70 (10/15 1028) Resp:  [14-41] 24 (10/15 1000) BP: (119-171)/(57-141) 119/73 mmHg (10/15 1028) SpO2:  [89 %-100 %] 93 % (10/15 1000) Arterial Line BP: (129-184)/(71-101) 152/83 mmHg (10/14 1530) Weight:  [63.5 kg (139 lb 15.9 oz)] 63.5 kg (139 lb 15.9 oz) (10/15 0608) Room air  HEMODYNAMICS:   VENTILATOR SETTINGS:   INTAKE / OUTPUT: Intake/Output     10/14 0701 - 10/15 0700 10/15 0701 - 10/16 0700   P.O. 600 120   I.V. (mL/kg) 446.1 (  7)    IV Piggyback 200    Total Intake(mL/kg) 1246.1 (19.6) 120 (1.9)   Urine (mL/kg/hr) 3075 200 (0.8)   Total Output 3075 200   Net -1828.9 -80        Urine Occurrence 2 x      PHYSICAL EXAMINATION: General:  No acute distress.  Neuro:  Awake, alert, no focal def  HEENT:  [place], no JVD  Cardiovascular: rrr w/ II/VI systolic murmur  Lungs:  Crackles Right>left  Abdomen:  Soft, Positive bowel sounds  Musculoskeletal:  No edema Skin:  Intact   LABS:  CBC Recent Labs     07/31/13  0904  07/31/13  1053  08/01/13  0425  WBC   --   31.0*  30.1*  HGB  18.4*  16.7  18.0*  HCT  54.0*  48.3  52.5*  PLT   --   261  224   Coag's Recent Labs     07/31/13  1053  08/01/13  0425  APTT  55*   --   INR  1.39  1.04   BMET Recent Labs     07/31/13  1053  08/01/13  0425  08/01/13  0800  NA  135  132*  137  K  3.4*  MARKED HEMOLYSIS  3.8  CL  100  95*  98  CO2  22  17*  30  BUN  19  16  17   CREATININE  0.75  0.75  0.85  GLUCOSE  132*  106*  114*   Electrolytes Recent Labs     07/31/13  1053  08/01/13  0425  08/01/13  0800  CALCIUM  9.1  9.3   9.3   Sepsis Markers No results found for this basename: LACTICACIDVEN, PROCALCITON, O2SATVEN,  in the last 72 hours ABG No results found for this basename: PHART, PCO2ART, PO2ART,  in the last 72 hours Liver Enzymes Recent Labs     07/31/13  1053  AST  40*  ALT  18  ALKPHOS  69  BILITOT  1.0  ALBUMIN  3.6   Cardiac Enzymes Recent Labs     07/31/13  1053  07/31/13  1639  07/31/13  2252  TROPONINI  1.13*  4.79*  7.48*   Glucose No results found for this basename: GLUCAP,  in the last 72 hours  Imaging Dg Chest Port 1 View  08/01/2013   CLINICAL DATA:  Shortness of breath. Pneumonia.  EXAM: PORTABLE CHEST - 1 VIEW  COMPARISON:  Chest CT from yesterday.  FINDINGS: Interstitial opacities are diffuse and partly chronic. There is an asymmetric opacity in the right mid lung which correlates with possible pneumonia on previous CT. No effusion or pneumothorax. No cardiomegaly. Status post CABG.  IMPRESSION: Improved pulmonary edema. Persistent right mid lung opacity which could represent pneumonia or residual asymmetric edema.   Electronically Signed   By: Tiburcio Pea M.D.   On: 08/01/2013 05:08   Ct Angio Chest Aorta W/cm &/or Wo/cm  07/31/2013   CLINICAL DATA:  Shortness of breath. Substernal chest pain. Evaluate for dissection or pulmonary embolus.  EXAM: CT ANGIOGRAPHY CHEST WITH CONTRAST  TECHNIQUE: Multidetector CT imaging of the chest was performed using the standard protocol during bolus administration of intravenous contrast. Multiplanar CT image reconstructions including MIPs were obtained to evaluate the vascular anatomy.  CONTRAST:  80mL OMNIPAQUE IOHEXOL 350 MG/ML SOLN  COMPARISON:  Chest CT 08/15/2012  FINDINGS: Prior CABG. Aorta is diffusely calcified without aneurysm or dissection.  No filling defects in the pulmonary arteries to suggest pulmonary emboli. Bilateral interstitial thickening and posterior ground-glass opacities. This could reflect edema or infection. This is  greater on the right. Mild COPD changes. Trace right pleural effusion. Heart is borderline in size. No mediastinal, hilar, or axillary adenopathy. Chest wall soft tissues are unremarkable. Imaging into the upper abdomen shows no acute findings. Atherosclerotic irregularity and calcifications in the visualized abdominal aorta without visible aneurysm or dissection.  Review of the MIP images confirms the above findings.  IMPRESSION: No evidence of aortic aneurysm or dissection. No evidence of pulmonary embolus. Aorta is diffusely calcified with irregular plaque in the distal thoracic aorta and visualized upper abdominal aorta.  Bilateral predominantly dependent ground-glass opacities and interstitial thickening. Findings could reflect asymmetric edema or infection. Trace right pleural effusion.  Prior CABG.   Electronically Signed   By: Charlett Nose M.D.   On: 07/31/2013 10:53     CXR: right > left airspace disease.   ASSESSMENT / PLAN:  PULMONARY A: Right > left pulmonary infiltrates.  Favor asymmetric edema but also possible aspiration pneumonitis on list of diff dx.  Peri op aspiration?  P:   Send urine sterp and legionella studies Diurese as tolerated See ID section pcxr follow up On RA, sats follow  CARDIOVASCULAR A:  STEMI s/p ptca and DES P:  rx per cards Continue ASA/Plavix/statin/beta blocker/Ace-inh  RENAL A:   No acute issue  P:   Trend CBC   GASTROINTESTINAL A:   H/o GERD. Actually f/b GI P:   Scheduled PPI SLP  HEMATOLOGIC A:   No acute bleeding  P:  Trend CBC on plavix   INFECTIOUS A:   Right > left airspace disease  ? Aspiration vs asymmetric edema  P:  Cover for aspiration, did he use LMA for case finger? Agree w/ vanc for now PCT   ENDOCRINE A:  No acute issue  P:   Trend cbc   NEUROLOGIC A:  Hx of ETOH dep  P:   Watch for s/sx w/d   I have personally obtained a history, examined the patient, evaluated laboratory and imaging results,  formulated the assessment and plan and placed orders.  Mcarthur Rossetti. Tyson Alias, MD, FACP Pgr: [phone] [facility]  Pulmonary and Critical Care Medicine [facility] Pager: [phone]  08/01/2013, 10:46 AM

## 2013-08-01 NOTE — Evaluation (Signed)
Clinical/Bedside Swallow Evaluation Patient Details  Name: Steve Durham MRN: [id number] Date of Birth: [birth year]  Today's Date: 08/01/2013 Time: 4098-1191 SLP Time Calculation (min): 16 min  Past Medical History:  Past Medical History  Diagnosis Date  . Hypertension   . Hyperlipoproteinemia   . Aortic valve disorders     a. Mod AS/AI by cath 07/2012.  . Back pain   . Carotid artery occlusion     a. Dopplers 07/2012: no significant high grade obstruction.  . Hyperlipidemia   . Abnormal CT scan, kidney     07/2012 - multiple small cysts  . Cholelithiasis     Seen on CT 07/2012  . Coronary artery disease     a. CABG 10/1991. b. cath 07/2012 demonstrating occluded SVGs to OM & RCA; segmental LMCA & OM dz, patent IMA w/ insertional dz, moderate AI/AS -- for med rx for now.   Past Surgical History:  Past Surgical History  Procedure Laterality Date  . Cardiac catheterization  04/03/99  . Carpal tunnel release  2007    Excision mass dorsal left wrist  . Hernia repair  1988  . Coronary artery bypass graft  1993  . Fasciotomy Right 07/30/2013    Procedure: OPEN FASCIOTOMY RIGHT RING FINGER & RIGHT SMALL FINGER MULTIPLE LEVELS;  Surgeon: Nicki Reaper, MD;  Location: [facility];  Service: Orthopedics;  Laterality: Right;   HPI:  67 y/o M with history of CAD s/p CABG 1993 (cath 07/2012 -> poor candidate for redo CABG, for med rx), mod AS/AI, HTN, HLD who presented to [facility] today with chest pain and EKG concerning for STEMI. He underwent fasciotomy of his right ring and little fingers for Dupuytren's contracture yesterday and went home feeling well. He was awakened at 4am this morning with 10/10 substernal chest pain radiating to his left neck associated with SOB, nausea, and vomiting. He states he's "always SOB." He was transported directly to the cath lab. He is still having 6/10 chest pain and nausea. He denies any fevers, chills, abdominal pain, or recent  bleeding. He did not take his regular medicines yet today.  CXR Improved pulmonary edema. Persistent right mid lung opacity which could represent pneumonia or residual asymmetric edema.  PMH:  HTN, CAD.   Assessment / Plan / Recommendation Clinical Impression  Swallow assessment completed.  Pt. complains of a globus sensation typically one hour after eating.  He states a "swallow test" was done last month and they said everything was "open".  Suspect procedure was an esophagram and he states it was not performed at [facility].  No indications of oropharyngeal dysphagia during observation with chef salad or water.  Pna may be due to aspiration of emesis when he vomited (after surgery?).  SLP reviewed/educated pt. on esophageal strategies to minimize any effects of suspected esophageal involvement.  Recommend he continue regular diet texture and thin liquids.  No ST follow up needed.     Aspiration Risk  Mild    Diet Recommendation Regular;Thin liquid   Liquid Administration via: Cup;Straw Medication Administration: Whole meds with liquid Supervision: Patient able to self feed Compensations: Small sips/bites;Follow solids with liquid Postural Changes and/or Swallow Maneuvers: Seated upright 90 degrees;Upright 30-60 min after meal    Other  Recommendations Oral Care Recommendations: Oral care BID   Follow Up Recommendations  None    Frequency and Duration        Pertinent Vitals/Pain N/A  Swallow Study           Oral/Motor/Sensory Function Overall Oral Motor/Sensory Function: Appears within functional limits for tasks assessed   Ice Chips Ice chips: Not tested   Thin Liquid Thin Liquid: Within functional limits Presentation: Straw    Nectar Thick Nectar Thick Liquid: Not tested   Honey Thick Honey Thick Liquid: Not tested   Puree Puree: Not tested   Solid   GO    Solid: Within functional limits       Breck Coons [employer]  [phone]  08/01/2013

## 2013-08-01 NOTE — Progress Notes (Signed)
SUBJECTIVE: Brief episodes of chest pain overnight. No chest pain this am. Still with cough.   BP 132/61  Pulse 81  Temp(Src) 98.5 F (36.9 C) (Oral)  Resp 41  Ht 5\' 5"  (1.651 m)  Wt 139 lb 15.9 oz (63.5 kg)  BMI 23.3 kg/m2  SpO2 96%  Intake/Output Summary (Last 24 hours) at 08/01/13 0708 Last data filed at 08/01/13 0200  Gross per 24 hour  Intake 1246.1 ml  Output   3075 ml  Net -1828.9 ml    PHYSICAL EXAM General: Well developed, well nourished, in no acute distress. Alert and oriented x 3.  Psych:  Good affect, responds appropriately Neck: No JVD. No masses noted.  Lungs: Rhonci right lung. Coarse BS left lung.  Heart: RRR with slight systolic murmur noted. Abdomen: Bowel sounds are present. Soft, non-tender.  Extremities: No lower extremity edema. Right groin cath site ok, no hematoma.   LABS: Basic Metabolic Panel:  Recent Labs  45/40/98 1053 08/01/13 0425  NA 135 132*  K 3.4* PENDING  CL 100 95*  CO2 22 17*  GLUCOSE 132* 106*  BUN 19 16  CREATININE 0.75 0.75  CALCIUM 9.1 9.3   CBC:  Recent Labs  07/31/13 1053 08/01/13 0425  WBC 31.0* 30.1*  HGB 16.7 18.0*  HCT 48.3 52.5*  MCV 94.9 97.6  PLT 261 224   Cardiac Enzymes:  Recent Labs  07/31/13 1053 07/31/13 1639 07/31/13 2252  CKTOTAL 373*  --   --   CKMB 24.7*  --   --   TROPONINI 1.13* 4.79* 7.48*   Fasting Lipid Panel:  Recent Labs  08/01/13 0425  CHOL 161  HDL 65  LDLCALC 73  TRIG 116  CHOLHDL 2.5    Current Meds: . amLODipine  10 mg Oral Daily  . antiseptic oral rinse  15 mL Mouth Rinse BID  . aspirin EC  81 mg Oral Daily  . benazepril  40 mg Oral Daily  . ceFEPime (MAXIPIME) IV  1 g Intravenous Q12H  . clopidogrel  75 mg Oral Q breakfast  . ezetimibe-simvastatin  1 tablet Oral QHS  . folic acid  1 mg Oral Daily  . furosemide  40 mg Intravenous Q12H  . LORazepam  0-4 mg Oral Q6H   Followed by  . [START ON 08/02/2013] LORazepam  0-4 mg Oral Q12H  . metoprolol  tartrate  50 mg Oral BID  . multivitamin with minerals  1 tablet Oral Daily  . nicotine  14 mg Transdermal Daily  . pantoprazole  40 mg Oral Daily  . thiamine  100 mg Oral Daily   Or  . thiamine  100 mg Intravenous Daily  . vancomycin  750 mg Intravenous Q12H   Echo 07/31/13: Left ventricle: The cavity size was normal. Systolic function was normal. The estimated ejection fraction was in the range of 55% to 60%. Wall motion was normal; there were no regional wall motion abnormalities. - Aortic valve: There was mild stenosis. Valve area: 1.44cm^2(VTI). Valve area: 1.51cm^2 (Vmax). - Left atrium: The atrium was mildly dilated. - Atrial septum: No defect or patent foramen ovale was Identified.  CTA chest 07/31/13:  No evidence of aortic aneurysm or dissection. No evidence of  pulmonary embolus. Aorta is diffusely calcified with irregular  plaque in the distal thoracic aorta and visualized upper abdominal  aorta.  Bilateral predominantly dependent ground-glass opacities and  interstitial thickening. Findings could reflect asymmetric edema or  infection. Trace right pleural effusion.  CXR 08/01/13:  Improved pulmonary edema. Persistent right mid lung opacity which  could represent pneumonia or residual asymmetric edema  ASSESSMENT AND PLAN:  1. CAD/NSTEMI: Pt admitted 07/31/13 with acute coronary syndrome/severe chest pain/cough. Found to have severe stenosis in the mid LAD beyond graft insertion. Now s/p DES x 1 mid LAD (PCI through graft). Echo 07/31/13 with preserved LV function, mild AS. No evidence of PE or aortic dissection on CTA chest. Continue ASA/Plavix/statin/beta blocker/Ace-inh.   2. Pneumonia: Right lung with persistent airspace disease. Continue Vancomycin/cefepime, day 2.    3. Tobacco abuse: Nicotine patch in place. Smoking cessation counseling.   4. HTN:  BP controlled.   5. Etoh abuse: Withdrawal protocol in place.   Will continue to monitor in ICU today  given multiple complex issues.   Klara Stjames  10/15/20147:08 AM

## 2013-08-01 NOTE — Progress Notes (Addendum)
CARDIAC REHAB PHASE I   PRE:  Rate/Rhythm: 75 SR  BP:  Supine: 119/73  Sitting:   Standing:    SaO2: 92 RA  MODE:  Ambulation: 350 ft   POST:  Rate/Rhythm: 84 SR  BP:  Supine:   Sitting: 125/70  Standing:    SaO2: 95 RA 1040-1125 On arrival pt sleeping easy to arouse. Assisted X 2 used walker and gait belt to ambulate. Pt c/o of feeling dizzy headed during walk. He had difficulty steering walker and went to right side of hall, may have been related to cast on right hand. He denies any vision problems. Pt to recliner after walk. Started MI and stent education with pt. He voices understanding. Pt c/o of feeling sleepy and that he has missed sleep.since being in the hospital.We will continue to follow pt. I discussed smoking cessation with him and gave him tips for quitting and coaching contact number. Pt does not seem committed to quitting, I will continue to encourage him to.  Melina Copa RN 08/01/2013 11:25 AM

## 2013-08-02 ENCOUNTER — Inpatient Hospital Stay (HOSPITAL_COMMUNITY): Payer: Medicare Other

## 2013-08-02 DIAGNOSIS — I2582 Chronic total occlusion of coronary artery: Secondary | ICD-10-CM | POA: Diagnosis not present

## 2013-08-02 DIAGNOSIS — J690 Pneumonitis due to inhalation of food and vomit: Secondary | ICD-10-CM | POA: Diagnosis not present

## 2013-08-02 DIAGNOSIS — I25810 Atherosclerosis of coronary artery bypass graft(s) without angina pectoris: Secondary | ICD-10-CM | POA: Diagnosis not present

## 2013-08-02 DIAGNOSIS — I214 Non-ST elevation (NSTEMI) myocardial infarction: Secondary | ICD-10-CM | POA: Diagnosis not present

## 2013-08-02 DIAGNOSIS — R0602 Shortness of breath: Secondary | ICD-10-CM | POA: Diagnosis not present

## 2013-08-02 DIAGNOSIS — I359 Nonrheumatic aortic valve disorder, unspecified: Secondary | ICD-10-CM | POA: Diagnosis not present

## 2013-08-02 DIAGNOSIS — I2510 Atherosclerotic heart disease of native coronary artery without angina pectoris: Secondary | ICD-10-CM | POA: Diagnosis not present

## 2013-08-02 LAB — CBC
HCT: 42.7 % (ref 39.0–52.0)
Hemoglobin: 14.2 g/dL (ref 13.0–17.0)
MCH: 32.3 pg (ref 26.0–34.0)
MCHC: 33.3 g/dL (ref 30.0–36.0)
MCV: 97.3 fL (ref 78.0–100.0)
RDW: 13.1 % (ref 11.5–15.5)

## 2013-08-02 LAB — BASIC METABOLIC PANEL
BUN: 29 mg/dL — ABNORMAL HIGH (ref 6–23)
CO2: 29 mEq/L (ref 19–32)
Chloride: 100 mEq/L (ref 96–112)
Glucose, Bld: 102 mg/dL — ABNORMAL HIGH (ref 70–99)
Potassium: 3.1 mEq/L — ABNORMAL LOW (ref 3.5–5.1)

## 2013-08-02 MED ORDER — POTASSIUM CHLORIDE CRYS ER 10 MEQ PO TBCR
EXTENDED_RELEASE_TABLET | ORAL | Status: AC
  Administered 2013-08-02: 30 meq
  Filled 2013-08-02: qty 3

## 2013-08-02 MED ORDER — POTASSIUM CHLORIDE CRYS ER 10 MEQ PO TBCR: 30.0000 meq | EXTENDED_RELEASE_TABLET | Freq: Once | ORAL | Status: DC

## 2013-08-02 MED ORDER — POTASSIUM CHLORIDE CRYS ER 10 MEQ PO TBCR
EXTENDED_RELEASE_TABLET | ORAL | Status: AC
  Filled 2013-08-02: qty 2

## 2013-08-02 MED ORDER — DEXTROSE 5 % IV SOLN
1.0000 g | INTRAVENOUS | Status: AC
  Administered 2013-08-02 – 2013-08-04 (×3): 1 g via INTRAVENOUS
  Filled 2013-08-02 (×3): qty 10

## 2013-08-02 MED ORDER — FUROSEMIDE 40 MG PO TABS
40.0000 mg | ORAL_TABLET | Freq: Two times a day (BID) | ORAL | Status: DC
  Administered 2013-08-02 – 2013-08-04 (×5): 40 mg via ORAL
  Filled 2013-08-02 (×7): qty 1

## 2013-08-02 MED ORDER — POTASSIUM CHLORIDE CRYS ER 20 MEQ PO TBCR
30.0000 meq | EXTENDED_RELEASE_TABLET | ORAL | Status: AC
  Administered 2013-08-02: 30 meq via ORAL

## 2013-08-02 NOTE — Consult Note (Signed)
PULMONARY  / CRITICAL CARE MEDICINE  Name: Steve Durham MRN: [id number] DOB: 03-19-1946    ADMISSION DATE:  07/31/2013 CONSULTATION DATE:  10/15  REFERRING MD :  Clifton James  PRIMARY SERVICE: cardiology   CHIEF COMPLAINT:  Possible PNA   BRIEF PATIENT DESCRIPTION:  This is a 67 year old male who was admitted on 10/14 for STEMI s/p fasciotomy of his right ring and little fingers for Dupuytren's contracture 10/13. He had PtCA/DES to LAD. On 10/15 had cough, leukocytosis and R>L airspace disease. PCCM asked to see for possible PNA  SIGNIFICANT EVENTS / STUDIES:  CT chest 10/14: No evidence of aortic aneurysm or dissection. No evidence of pulmonary embolus. Aorta is diffusely calcified with irregular plaque in the distal thoracic aorta and visualized upper abdominal aorta.  Bilateral predominantly dependent ground-glass opacities and interstitial thickening. Findings could reflect asymmetric edema or infection. Trace right pleural effusion. Left heart cath 10/14: PtCa and stent to LAD Echo 10/14:>>>The cavity size was normal. Systolicfunction was normal. The estimated ejection fraction was in the range of 55% to 60%. Wall motion was normal; there were no regional wall motion abnormalities. - Aortic valve: There was mild stenosis  LINES / TUBES:  CULTURES: BC 10/14 x 2>>> Urine strep>>>neg  ANTIBIOTICS: vanc 10/14>>> maxipime 101/4>>>10/15 Zosyn 10/15>>>  SUBJECTIVE:  No distress   VITAL SIGNS: Temp:  [97.7 F (36.5 C)-99.2 F (37.3 C)] 97.7 F (36.5 C) (10/16 0800) Pulse Rate:  [61-91] 71 (10/16 0800) Resp:  [13-27] 21 (10/16 0800) BP: (93-127)/(48-73) 125/58 mmHg (10/16 0800) SpO2:  [91 %-97 %] 94 % (10/16 0800) Weight:  [65.4 kg (144 lb 2.9 oz)] 65.4 kg (144 lb 2.9 oz) (10/16 0500) Room air  HEMODYNAMICS:   VENTILATOR SETTINGS:   INTAKE / OUTPUT: Intake/Output     10/15 0701 - 10/16 0700 10/16 0701 - 10/17 0700   P.O. 360    I.V. (mL/kg) 110 (1.7) 10 (0.2)   IV  Piggyback 600    Total Intake(mL/kg) 1070 (16.4) 10 (0.2)   Urine (mL/kg/hr) 800 (0.5)    Total Output 800     Net +270 +10          PHYSICAL EXAMINATION: General:  No acute distress.  Neuro:  Awake, alert, no focal def  HEENT:  [place], no JVD  Cardiovascular: rrr w/ II/VI systolic murmur  Lungs:  Coarse  Abdomen:  Soft, Positive bowel sounds, no r/g Musculoskeletal:  No edema Skin:  Intact   LABS:  CBC Recent Labs     07/31/13  1053  08/01/13  0425  08/02/13  0502  WBC  31.0*  30.1*  16.8*  HGB  16.7  18.0*  14.2  HCT  48.3  52.5*  42.7  PLT  261  224  202   Coag's Recent Labs     07/31/13  1053  08/01/13  0425  APTT  55*   --   INR  1.39  1.04   BMET Recent Labs     08/01/13  0425  08/01/13  0800  08/02/13  0502  NA  132*  137  139  K  MARKED HEMOLYSIS  3.8  3.1*  CL  95*  98  100  CO2  17*  30  29  BUN  16  17  29*  CREATININE  0.75  0.85  1.10  GLUCOSE  106*  114*  102*   Electrolytes Recent Labs     08/01/13  0425  08/01/13  0800  08/02/13  0502  CALCIUM  9.3  9.3  8.7   Sepsis Markers Recent Labs     08/01/13  1114  08/02/13  0502  PROCALCITON  <0.10  <0.10   ABG No results found for this basename: PHART, PCO2ART, PO2ART,  in the last 72 hours Liver Enzymes Recent Labs     07/31/13  1053  AST  40*  ALT  18  ALKPHOS  69  BILITOT  1.0  ALBUMIN  3.6   Cardiac Enzymes Recent Labs     07/31/13  1053  07/31/13  1639  07/31/13  2252  TROPONINI  1.13*  4.79*  7.48*   Glucose No results found for this basename: GLUCAP,  in the last 72 hours  Imaging Dg Chest Port 1 View  08/02/2013   CLINICAL DATA:  Shortness of breath.  EXAM: PORTABLE CHEST - 1 VIEW  COMPARISON:  08/01/2013.  FINDINGS: Post CABG. Heart size top-normal.  Mild pulmonary vascular prominence.  No segmental consolidation or gross pneumothorax.  Slightly tortuous aorta.  Prominent carotid bifurcation calcifications.  IMPRESSION: Mild pulmonary vascular prominence.   Please see above.   Electronically Signed   By: Bridgett Larsson M.D.   On: 08/02/2013 07:36   Dg Chest Port 1 View  08/01/2013   CLINICAL DATA:  Shortness of breath. Pneumonia.  EXAM: PORTABLE CHEST - 1 VIEW  COMPARISON:  Chest CT from yesterday.  FINDINGS: Interstitial opacities are diffuse and partly chronic. There is an asymmetric opacity in the right mid lung which correlates with possible pneumonia on previous CT. No effusion or pneumothorax. No cardiomegaly. Status post CABG.  IMPRESSION: Improved pulmonary edema. Persistent right mid lung opacity which could represent pneumonia or residual asymmetric edema.   Electronically Signed   By: Tiburcio Pea M.D.   On: 08/01/2013 05:08   Ct Angio Chest Aorta W/cm &/or Wo/cm  07/31/2013   CLINICAL DATA:  Shortness of breath. Substernal chest pain. Evaluate for dissection or pulmonary embolus.  EXAM: CT ANGIOGRAPHY CHEST WITH CONTRAST  TECHNIQUE: Multidetector CT imaging of the chest was performed using the standard protocol during bolus administration of intravenous contrast. Multiplanar CT image reconstructions including MIPs were obtained to evaluate the vascular anatomy.  CONTRAST:  80mL OMNIPAQUE IOHEXOL 350 MG/ML SOLN  COMPARISON:  Chest CT 08/15/2012  FINDINGS: Prior CABG. Aorta is diffusely calcified without aneurysm or dissection. No filling defects in the pulmonary arteries to suggest pulmonary emboli. Bilateral interstitial thickening and posterior ground-glass opacities. This could reflect edema or infection. This is greater on the right. Mild COPD changes. Trace right pleural effusion. Heart is borderline in size. No mediastinal, hilar, or axillary adenopathy. Chest wall soft tissues are unremarkable. Imaging into the upper abdomen shows no acute findings. Atherosclerotic irregularity and calcifications in the visualized abdominal aorta without visible aneurysm or dissection.  Review of the MIP images confirms the above findings.  IMPRESSION: No  evidence of aortic aneurysm or dissection. No evidence of pulmonary embolus. Aorta is diffusely calcified with irregular plaque in the distal thoracic aorta and visualized upper abdominal aorta.  Bilateral predominantly dependent ground-glass opacities and interstitial thickening. Findings could reflect asymmetric edema or infection. Trace right pleural effusion.  Prior CABG.   Electronically Signed   By: Charlett Nose M.D.   On: 07/31/2013 10:53     CXR: right > left airspace disease.   ASSESSMENT / PLAN:  PULMONARY A: Right > left pulmonary infiltrates.  Peri op aspiration highly likely (gerd, ett for or etc)  P:   Rapidly resolving rt base infiltrate, likely can limit abx course pcxr clearing fast and clinically doing well ppi No further pcxr required  CARDIOVASCULAR A:  STEMI s/p ptca and DES P:  rx per cards Continue ASA/Plavix/statin/beta blocker/Ace-inh tele  RENAL A:   Hypokalemia P:   k supp  GASTROINTESTINAL A:   H/o GERD. Actually f/b GI P:   Scheduled PPI mandatory SLP, to r/o silect aspiration in setting rt base infiltrate , had ett for OR and gerd  HEMATOLOGIC A:   No acute bleeding  Leukocytosis resolving also rapidly P:  Trend CBC on plavix   INFECTIOUS A:   Right > left airspace disease  ? Aspiration  P:  PCT unimpressive and pcxr / symptoms / labs all rapidly resolving Narrow abx and consider 5 days in total   NEUROLOGIC A:  Hx of ETOH dep  P:   Watch for s/sx w/d  ciwa  I have personally obtained a history, examined the patient, evaluated laboratory and imaging results, formulated the assessment and plan and placed orders.   Will sign off, abx duration in place Has rapidly resolved symptoms Follow slp in this etoh pt  Mcarthur Rossetti. Tyson Alias, MD, FACP Pgr: [phone]  Pulmonary & Critical Care  Pulmonary and Critical Care Medicine [facility] Pager: [phone]  08/02/2013, 9:36 AM

## 2013-08-02 NOTE — Progress Notes (Signed)
[facility] ADULT ICU REPLACEMENT PROTOCOL FOR AM LAB REPLACEMENT ONLY  The patient does apply for the [facility] Adult ICU Electrolyte Replacment Protocol based on the criteria listed below:   1. Is GFR >/= 40 ml/min? yes  Patient's GFR today is 67 2. Is urine output >/= 0.5 ml/kg/hr for the last 6 hours? yes Patient's UOP is .5 ml/kg/hr 3. Is BUN < 60 mg/dL? yes  Patient's BUN today is 29 4. Abnormal electrolyte(s):Potassium 5. Ordered repletion with: Potassium per protocol 6. If a panic level lab has been reported, has the CCM MD in charge been notified?  Physician:     Thomasenia Bottoms 08/02/2013 6:56 AM

## 2013-08-02 NOTE — Progress Notes (Signed)
New orders received. Evaluation complete 10/15. Discussed with RN who was not aware that eval complete. No need for re-eval today. See SLP notes from 10/15.   Ferdinand Lango MA, CCC-SLP [phone]

## 2013-08-02 NOTE — Progress Notes (Signed)
SUBJECTIVE: No chest pain. Breathing better.   BP 125/65  Pulse 67  Temp(Src) 98.8 F (37.1 C) (Oral)  Resp 22  Ht 5\' 5"  (1.651 m)  Wt 144 lb 2.9 oz (65.4 kg)  BMI 23.99 kg/m2  SpO2 92%  Intake/Output Summary (Last 24 hours) at 08/02/13 0701 Last data filed at 08/02/13 0600  Gross per 24 hour  Intake   1060 ml  Output    600 ml  Net    460 ml    PHYSICAL EXAM General: Well developed, well nourished, in no acute distress. Alert and oriented x 3.  Psych:  Good affect, responds appropriately Neck: No JVD. No masses noted.  Lungs: Clear bilaterally with no wheezes or rhonci noted.  Heart: RRR with no murmurs noted. Abdomen: Bowel sounds are present. Soft, non-tender.  Extremities: No lower extremity edema.   LABS: Basic Metabolic Panel:  Recent Labs  40/98/11 0800 08/02/13 0502  NA 137 139  K 3.8 3.1*  CL 98 100  CO2 30 29  GLUCOSE 114* 102*  BUN 17 29*  CREATININE 0.85 1.10  CALCIUM 9.3 8.7   CBC:  Recent Labs  08/01/13 0425 08/02/13 0502  WBC 30.1* 16.8*  HGB 18.0* 14.2  HCT 52.5* 42.7  MCV 97.6 97.3  PLT 224 202   Cardiac Enzymes:  Recent Labs  07/31/13 1053 07/31/13 1639 07/31/13 2252  CKTOTAL 373*  --   --   CKMB 24.7*  --   --   TROPONINI 1.13* 4.79* 7.48*   Fasting Lipid Panel:  Recent Labs  08/01/13 0425  CHOL 161  HDL 65  LDLCALC 73  TRIG 116  CHOLHDL 2.5    Current Meds: . amLODipine  10 mg Oral Daily  . antiseptic oral rinse  15 mL Mouth Rinse BID  . aspirin EC  81 mg Oral Daily  . benazepril  40 mg Oral Daily  . clopidogrel  75 mg Oral Q breakfast  . ezetimibe-simvastatin  1 tablet Oral QHS  . folic acid  1 mg Oral Daily  . furosemide  40 mg Intravenous Q12H  . LORazepam  0-4 mg Oral Q6H   Followed by  . LORazepam  0-4 mg Oral Q12H  . metoprolol tartrate  50 mg Oral BID  . multivitamin with minerals  1 tablet Oral Daily  . nicotine  14 mg Transdermal Daily  . pantoprazole  40 mg Oral Daily  .  piperacillin-tazobactam (ZOSYN)  IV  3.375 g Intravenous Q8H  . potassium chloride  30 mEq Oral Q4H  . thiamine  100 mg Oral Daily   Or  . thiamine  100 mg Intravenous Daily  . vancomycin  750 mg Intravenous Q12H   Echo 07/31/13:  Left ventricle: The cavity size was normal. Systolic function was normal. The estimated ejection fraction was in the range of 55% to 60%. Wall motion was normal; there were no regional wall motion abnormalities. - Aortic valve: There was mild stenosis. Valve area: 1.44cm^2(VTI). Valve area: 1.51cm^2 (Vmax). - Left atrium: The atrium was mildly dilated. - Atrial septum: No defect or patent foramen ovale was Identified.   CTA chest 07/31/13:  No evidence of aortic aneurysm or dissection. No evidence of  pulmonary embolus. Aorta is diffusely calcified with irregular  plaque in the distal thoracic aorta and visualized upper abdominal  aorta.  Bilateral predominantly dependent ground-glass opacities and  interstitial thickening. Findings could reflect asymmetric edema or  infection. Trace right pleural effusion.  ASSESSMENT AND PLAN:   1. CAD/NSTEMI: Pt admitted 07/31/13 with acute coronary syndrome/severe chest pain/cough. Found to have severe stenosis in the mid LAD beyond graft insertion. Now s/p DES x 1 mid LAD (PCI through graft). Echo 07/31/13 with preserved LV function, mild AS. No evidence of PE or aortic dissection on CTA chest. Continue ASA/Plavix/statin/beta blocker/Ace-inh.   2. Pulmonary infiltrates/Pneumonia: Appreciate pulmonary assistance. Possible aspiration PNA. Continue Vancomycin/Zosyn. Will change to po Lasix today. Repeat CXR today.   3. Tobacco abuse: Nicotine patch in place. Smoking cessation counseling.   4. HTN: BP controlled.   5. Etoh abuse: Withdrawal protocol in place.   6. Hypokalemia: Replacement ordered this am.   Will transfer to stepdown ICU.  Ambulate today.   Steve Durham  10/16/20147:01 AM

## 2013-08-02 NOTE — Progress Notes (Signed)
CARDIAC REHAB PHASE I   PRE:  Rate/Rhythm: 74SR  BP:  Supine:   Sitting: 130/68  Standing:    SaO2: 98%RA  MODE:  Ambulation: 700 ft   POST:  Rate/Rhythm: 84SR  BP:  Supine:   Sitting: 120/67  Standing:    SaO2: 99%RA 1200-1235 Assisted to bathroom for pt to have small BM. Stated he feels a little constipated. Walked 700 ft with hand held asst and tolerated well. Does not remember walking with [place] yesterday and thought I was kidding.  To recliner after walk. No CP. Slightly SOB. Declined CRP 2. Stated too busy keeping yard and house up. Discussed benefits with no success. Has MI booklet and heart healthy diet. Encouraged pt to read but stated not interested.    Luetta Nutting, RN BSN  08/02/2013 12:31 PM

## 2013-08-03 LAB — BASIC METABOLIC PANEL
CO2: 26 mEq/L (ref 19–32)
Calcium: 8.4 mg/dL (ref 8.4–10.5)
Creatinine, Ser: 0.96 mg/dL (ref 0.50–1.35)
GFR calc non Af Amer: 83 mL/min — ABNORMAL LOW (ref >90)
Glucose, Bld: 99 mg/dL (ref 70–99)
Sodium: 141 mEq/L (ref 135–145)

## 2013-08-03 LAB — CBC
HCT: 42.3 % (ref 39.0–52.0)
MCH: 32.4 pg (ref 26.0–34.0)
MCV: 97.2 fL (ref 78.0–100.0)
Platelets: 193 10*3/uL (ref 150–400)
RBC: 4.35 MIL/uL (ref 4.22–5.81)
WBC: 13.6 10*3/uL — ABNORMAL HIGH (ref 4.0–10.5)

## 2013-08-03 NOTE — Progress Notes (Signed)
CARDIAC REHAB PHASE I   PRE:  Rate/Rhythm: 70 SR  BP:  Supine:   Sitting: 117/82  Standing:    SaO2:   MODE:  Ambulation: 700 ft   POST:  Rate/Rhythm: 79 SR  BP:  Supine:   Sitting: 147/73  Standing:    SaO2:    Patient tolerated exercise well without angina symptoms.   Exercise education completed for possible discharge tomorrow.  Reviewed NTG usage, and angina symptoms.  Again, offered Phase II cardiac rehab in [place], but patient refused.  Advised to discuss with his cardiologist if interested at a later date.   [id number]  Cindra Eves RN, BSN 08/03/2013 [phone]

## 2013-08-03 NOTE — Progress Notes (Signed)
     SUBJECTIVE: No chest pain. No SOB.   BP 136/56  Pulse 64  Temp(Src) 98.8 F (37.1 C) (Oral)  Resp 16  Ht 5\' 5"  (1.651 m)  Wt 140 lb 14 oz (63.9 kg)  BMI 23.44 kg/m2  SpO2 97%  Intake/Output Summary (Last 24 hours) at 08/03/13 5621 Last data filed at 08/03/13 0800  Gross per 24 hour  Intake    780 ml  Output   1600 ml  Net   -820 ml    PHYSICAL EXAM General: Well developed, well nourished, in no acute distress. Alert and oriented x 3.  Psych:  Good affect, responds appropriately Neck: No JVD. No masses noted.  Lungs: Clear bilaterally with no wheezes or rhonci noted.  Heart: RRR with systolic murmur.  Abdomen: Bowel sounds are present. Soft, non-tender.  Extremities: No lower extremity edema.   LABS: Basic Metabolic Panel:  Recent Labs  30/86/57 0502 08/03/13 0541  NA 139 141  K 3.1* 3.8  CL 100 106  CO2 29 26  GLUCOSE 102* 99  BUN 29* 19  CREATININE 1.10 0.96  CALCIUM 8.7 8.4   CBC:  Recent Labs  08/02/13 0502 08/03/13 0541  WBC 16.8* 13.6*  HGB 14.2 14.1  HCT 42.7 42.3  MCV 97.3 97.2  PLT 202 193   Cardiac Enzymes:  Recent Labs  07/31/13 1053 07/31/13 1639 07/31/13 2252  CKTOTAL 373*  --   --   CKMB 24.7*  --   --   TROPONINI 1.13* 4.79* 7.48*   Fasting Lipid Panel:  Recent Labs  08/01/13 0425  CHOL 161  HDL 65  LDLCALC 73  TRIG 116  CHOLHDL 2.5    Current Meds: . amLODipine  10 mg Oral Daily  . antiseptic oral rinse  15 mL Mouth Rinse BID  . aspirin EC  81 mg Oral Daily  . benazepril  40 mg Oral Daily  . cefTRIAXone (ROCEPHIN)  IV  1 g Intravenous Q24H  . clopidogrel  75 mg Oral Q breakfast  . ezetimibe-simvastatin  1 tablet Oral QHS  . folic acid  1 mg Oral Daily  . furosemide  40 mg Oral BID  . LORazepam  0-4 mg Oral Q12H  . metoprolol tartrate  50 mg Oral BID  . multivitamin with minerals  1 tablet Oral Daily  . nicotine  14 mg Transdermal Daily  . pantoprazole  40 mg Oral Daily  . potassium chloride  30 mEq  Oral Once  . thiamine  100 mg Oral Daily   Or  . thiamine  100 mg Intravenous Daily     ASSESSMENT AND PLAN:  1. CAD/NSTEMI: Pt admitted 07/31/13 with acute coronary syndrome/severe chest pain/cough. Found to have severe stenosis in the mid LAD beyond graft insertion. Now s/p DES x 1 mid LAD (PCI through graft). Echo 07/31/13 with preserved LV function, mild AS. No evidence of PE or aortic dissection on CTA chest. Continue ASA/Plavix/statin/beta blocker/Ace-inh.   2. Pulmonary infiltrates/Pneumonia: Rapid improvement. Appreciate pulmonary assistance. Possible aspiration PNA. Antibiotics per PCCM.    3. Tobacco abuse: Nicotine patch in place. Smoking cessation counseling.   4. HTN: BP controlled.   5. Etoh abuse: Withdrawal protocol in place.   6. Hypokalemia: Resolved.   Will transfer to telemetry unit today. Likely home tomorrow (08/04/13). Ambulate today.     MCALHANY,CHRISTOPHER  10/17/20149:17 AM

## 2013-08-04 DIAGNOSIS — I214 Non-ST elevation (NSTEMI) myocardial infarction: Secondary | ICD-10-CM | POA: Diagnosis not present

## 2013-08-04 DIAGNOSIS — I2510 Atherosclerotic heart disease of native coronary artery without angina pectoris: Secondary | ICD-10-CM | POA: Diagnosis not present

## 2013-08-04 LAB — BASIC METABOLIC PANEL
BUN: 22 mg/dL (ref 6–23)
Calcium: 9.3 mg/dL (ref 8.4–10.5)
Chloride: 102 mEq/L (ref 96–112)
Creatinine, Ser: 1.07 mg/dL (ref 0.50–1.35)
GFR calc Af Amer: 80 mL/min — ABNORMAL LOW (ref >90)
Sodium: 140 mEq/L (ref 135–145)

## 2013-08-04 MED ORDER — PANTOPRAZOLE SODIUM 40 MG PO TBEC: 40.0000 mg | DELAYED_RELEASE_TABLET | Freq: Every day | ORAL | Status: DC

## 2013-08-04 MED ORDER — NICOTINE 14 MG/24HR TD PT24: 1.0000 | MEDICATED_PATCH | Freq: Every day | TRANSDERMAL | Status: DC

## 2013-08-04 MED ORDER — CLOPIDOGREL BISULFATE 75 MG PO TABS: 75.0000 mg | ORAL_TABLET | Freq: Every day | ORAL | Status: DC

## 2013-08-04 MED ORDER — FUROSEMIDE 40 MG PO TABS: 40.0000 mg | ORAL_TABLET | Freq: Every day | ORAL | Status: DC | PRN

## 2013-08-04 NOTE — Progress Notes (Signed)
08/04/13 Nursing Note  Discharge instructions and AVS, and medication list reviewed with patient. All questions were answered. Discharge instructions for cath site went over with patient and all questions were answered. Patients prescriptions were sent to pharmacy on file. Will discharge home as ordered. Keirstin Musil, Randall An RN

## 2013-08-04 NOTE — Discharge Summary (Signed)
CARDIOLOGY DISCHARGE SUMMARY   Patient ID: Steve Durham MRN: [id number] DOB/AGE: Nov 13, 1945 67 y.o.  Admit date: 07/31/2013 Discharge date: 08/04/2013  Primary Discharge Diagnosis:   NSTEMI (non-ST elevation myocardial infarction) - 2.25 x 20 mm Promus Premier DES in the mid LAD extending back into the IMA graft  Secondary Discharge Diagnosis:    HYPERTENSION   CAD, ARTERY BYPASS GRAFT   Aortic stenosis   Hyperlipidemia   Aortic insufficiency   Tobacco abuse   Habitual alcohol use   STEMI (ST elevated myocardial infarction)   Community acquired pneumonia   Aspiration pneumonia  Consults: CCM  Procedure Performed:  1. Left Heart Catheterization 2. Selective Coronary Angiography 3. Aortic root angiography 4. LIMA graft angiography 5. 2D Echocardiogram 6. CTA chest   Hospital Course: Steve Durham is a 67 y.o. male with a history of CAD/CABG. He had hand surgery 10/13 and tolerated it well. He woke up on 07/31/2013 with severe substernal chest pain. He came to the hospital and the code STEMI was initially called. However, his ECG had significant ST depression but no real ST elevation. He had ongoing pain so he was taken directly to the Cath Lab.  Cardiac catheterization results are below. He had PTCA and a 2.25 x 20 mm Promus Premier DES in the mid LAD extending back into the IMA graft. The stent was inserted through the graft. He tolerated the procedure well. He had a stat CT of the chest to rule out dissection which was negative. A 2-D echocardiogram was performed which showed preserved left ventricular function. He also had mild aortic stenosis.  His chest x-ray and CT angiogram of the chest or abnormal on admission. He developed a cough and was on antibiotics for possible pneumonia. A CCM consult was called to help manage his respiratory status. There was concern for aspiration pneumonitis. Urine strep and Legionella studies were performed and were negative. He was  also felt to have some volume overload and was diuresed. Perioperative aspiration was felt to be highly likely because of his history of GERD and because he had been intubated for the surgery. He was seen by speech therapy but no followup was needed and he is to be on a regular, thin liquid diet.  He was counseled on smoking cessation. A nicotine patch was placed. He has a history of EtOH use and was placed on withdrawal protocol. His vital signs were monitored carefully and his blood pressure was well-controlled. He was seen by cardiac rehabilitation and educated on MI restrictions, heart-healthy lifestyle changes and exercise guidelines.  His respiratory status improved rapidly. Dr. Tyson Alias felt that 3 days of antibiotics would be sufficient. His Lasix was changed to oral medication on 10/16. He will be on Lasix when necessary at discharge. His potassium dropped slightly with diuresis and was supplemented. He continued to ambulate with cardiac rehabilitation and his ambulation improved. Because of the Plavix required for his stent, his omeprazole was changed to Protonix.  On 08/04/2013, he was seen by Dr. Myrtis Ser. He was having no chest pain and his shortness of breath is greatly improved. Dr. Myrtis Ser considered Mr. discern ready for discharge in improved condition, to follow up as an outpatient.   Labs:  Lab Results  Component Value Date   WBC 13.6* 08/03/2013   HGB 14.1 08/03/2013   HCT 42.3 08/03/2013   MCV 97.2 08/03/2013   PLT 193 08/03/2013    Recent Labs Lab 07/31/13 1053  08/04/13 0540  NA  135  < > 140  K 3.4*  < > 4.3  CL 100  < > 102  CO2 22  < > 28  BUN 19  < > 22  CREATININE 0.75  < > 1.07  CALCIUM 9.1  < > 9.3  PROT 7.3  --   --   BILITOT 1.0  --   --   ALKPHOS 69  --   --   ALT 18  --   --   AST 40*  --   --   GLUCOSE 132*  < > 103*  < > = values in this interval not displayed. Lab Results  Component Value Date   CKTOTAL 373* 07/31/2013   CKMB 24.7* 07/31/2013    TROPONINI 7.48* 07/31/2013   Lipid Panel     Component Value Date/Time   CHOL 161 08/01/2013 0425   TRIG 116 08/01/2013 0425   HDL 65 08/01/2013 0425   CHOLHDL 2.5 08/01/2013 0425   VLDL 23 08/01/2013 0425   LDLCALC 73 08/01/2013 0425   Pro B Natriuretic peptide (BNP)  Date/Time Value Range Status  08/09/2012  5:49 PM 629.6* 0 - 125 pg/mL Final     Radiology: Dg Chest Port 1 View 08/02/2013   CLINICAL DATA:  Shortness of breath.  EXAM: PORTABLE CHEST - 1 VIEW  COMPARISON:  08/01/2013.  FINDINGS: Post CABG. Heart size top-normal.  Mild pulmonary vascular prominence.  No segmental consolidation or gross pneumothorax.  Slightly tortuous aorta.  Prominent carotid bifurcation calcifications.  IMPRESSION: Mild pulmonary vascular prominence.  Please see above.   Electronically Signed   By: Bridgett Larsson M.D.   On: 08/02/2013 07:36   Dg Chest Port 1 View 08/01/2013   CLINICAL DATA:  Shortness of breath. Pneumonia.  EXAM: PORTABLE CHEST - 1 VIEW  COMPARISON:  Chest CT from yesterday.  FINDINGS: Interstitial opacities are diffuse and partly chronic. There is an asymmetric opacity in the right mid lung which correlates with possible pneumonia on previous CT. No effusion or pneumothorax. No cardiomegaly. Status post CABG.  IMPRESSION: Improved pulmonary edema. Persistent right mid lung opacity which could represent pneumonia or residual asymmetric edema.   Electronically Signed   By: Tiburcio Pea M.D.   On: 08/01/2013 05:08   Ct Angio Chest Aorta W/cm &/or Wo/cm 07/31/2013   CLINICAL DATA:  Shortness of breath. Substernal chest pain. Evaluate for dissection or pulmonary embolus.  EXAM: CT ANGIOGRAPHY CHEST WITH CONTRAST  TECHNIQUE: Multidetector CT imaging of the chest was performed using the standard protocol during bolus administration of intravenous contrast. Multiplanar CT image reconstructions including MIPs were obtained to evaluate the vascular anatomy.  CONTRAST:  80mL OMNIPAQUE IOHEXOL 350  MG/ML SOLN  COMPARISON:  Chest CT 08/15/2012  FINDINGS: Prior CABG. Aorta is diffusely calcified without aneurysm or dissection. No filling defects in the pulmonary arteries to suggest pulmonary emboli. Bilateral interstitial thickening and posterior ground-glass opacities. This could reflect edema or infection. This is greater on the right. Mild COPD changes. Trace right pleural effusion. Heart is borderline in size. No mediastinal, hilar, or axillary adenopathy. Chest wall soft tissues are unremarkable. Imaging into the upper abdomen shows no acute findings. Atherosclerotic irregularity and calcifications in the visualized abdominal aorta without visible aneurysm or dissection.  Review of the MIP images confirms the above findings.  IMPRESSION: No evidence of aortic aneurysm or dissection. No evidence of pulmonary embolus. Aorta is diffusely calcified with irregular plaque in the distal thoracic aorta and visualized upper abdominal aorta.  Bilateral predominantly dependent ground-glass opacities and interstitial thickening. Findings could reflect asymmetric edema or infection. Trace right pleural effusion.  Prior CABG.   Electronically Signed   By: Charlett Nose M.D.   On: 07/31/2013 10:53    Cardiac Cath: 07/31/2013 Left main: The vessel is diffusely diseased. The mid vessel has diffuse 60% stenosis and tapers down to a 70% stenosis just before the bifurcation.  Left Anterior Descending Artery: 100% proximal occlusion. The mid and distal vessel fills from the patent IMA graft. The mid LAD has a 99% stenosis after the anastomosis of the graft. There are several diagonal branches. The first diagonal fills from left to left collaterals. The second diagonal fills from the IMA graft.  Circumflex Artery: Large caliber vessel with large obtuse marginal branch. There is diffuse disease in the circumflex extending down into the mid segment of the obtuse marginal branch. The proximal Circumflex has diffuse 50%  stenosis. The obtuse marginal branch has a stented segment. There is diffuse 70% stent restenosis. This appears stable from last cath.  Right Coronary Artery: 100% proximal occlusion. The mid and distal vessel is seen to fill from left to right collaterals.  Graft Anatomy:  SVG to PDA is known to be occluded and was not selectively engaged.  SVG to OM is known to be occluded and was not selectively engaged.  LIMA to mid LAD is patent. There is a 99% stenosis in the mid LAD just beyond the graft insertion site.  Left Ventricular Angiogram: Deferred.  Aortic root angiogram: No dilatation. Heavy calcification noted. No obvious dissection. Small filling defect above right coronary artery.  Impression:  1. Severe triple vessel CAD s/p 3V CABG with 1/3 patent bypass grafts.  2. Severe stenosis mid LAD beyond graft insertion site  3. Unstable angina  4. Successful 2.25 x 20 mm Promus Premier DES in the mid LAD extending back into the IMA graft 5. No obvious aortic dissection (confirmed by STAT CTA post cath)   EKG: 08/02/2013 Sinus rhythm Vent. rate 68 BPM PR interval 172 ms QRS duration 112 ms QT/QTc 474/504 ms P-R-T axes 62 29 -77  Echo: 07/31/2013 Conclusions - Left ventricle: The cavity size was normal. Systolic function was normal. The estimated ejection fraction was in the range of 55% to 60%. Wall motion was normal; there were no regional wall motion abnormalities. - Aortic valve: There was mild stenosis. Valve area: 1.44cm^2(VTI). Valve area: 1.51cm^2 (Vmax). - Left atrium: The atrium was mildly dilated. - Atrial septum: No defect or patent foramen ovale was identified.   FOLLOW UP PLANS AND APPOINTMENTS No Known Allergies   Medication List    STOP taking these medications       omeprazole 40 MG capsule  Commonly known as:  PRILOSEC  Replaced by:  pantoprazole 40 MG tablet      TAKE these medications       amLODipine-benazepril 5-20 MG per capsule  Commonly known  as:  LOTREL  Take 2 capsules by mouth daily.     aspirin 81 MG EC tablet  Take 1 tablet (81 mg total) by mouth daily.     clopidogrel 75 MG tablet  Commonly known as:  PLAVIX  Take 1 tablet (75 mg total) by mouth daily with breakfast.     ezetimibe-simvastatin 10-40 MG per tablet  Commonly known as:  VYTORIN  Take 1 tablet by mouth at bedtime.     fexofenadine 180 MG tablet  Commonly known as:  ALLEGRA  Take 180  mg by mouth daily as needed. For allergies     Fish Oil 1000 MG Caps  Take 1,000 mg by mouth 2 (two) times daily.     furosemide 40 MG tablet  Commonly known as:  LASIX  Take 1 tablet (40 mg total) by mouth daily as needed for edema.     HYDROcodone-acetaminophen 5-325 MG per tablet  Commonly known as:  NORCO  Take 1 tablet by mouth every 6 (six) hours as needed for pain.     isosorbide mononitrate 30 MG 24 hr tablet  Commonly known as:  IMDUR  Take 1 tablet (30 mg total) by mouth daily.     metoprolol tartrate 25 MG tablet  Commonly known as:  LOPRESSOR  Take 50 mg by mouth 2 (two) times daily.     nicotine 14 mg/24hr patch  Commonly known as:  NICODERM CQ - dosed in mg/24 hours  Place 1 patch onto the skin daily. 14 mg patch daily X 2 weeks, then 7 mg patch daily X 2 weeks then stop.     nitroGLYCERIN 0.4 MG SL tablet  Commonly known as:  NITROSTAT  Place 1 tablet (0.4 mg total) under the tongue every 5 (five) minutes as needed for chest pain (up to 3 doses).     pantoprazole 40 MG tablet  Commonly known as:  PROTONIX  Take 1 tablet (40 mg total) by mouth daily.        Discharge Orders   Future Orders Complete By Expires   Diet - low sodium heart healthy  As directed    Increase activity slowly  As directed      Follow-up Information   Follow up with Verne Carrow, MD. (The office will call.)    Specialty:  Cardiology   Contact information:   [address] [place] [address] [phone]       BRING ALL MEDICATIONS  WITH YOU TO FOLLOW UP APPOINTMENTS  Time spent with patient to include physician time: 36 min Signed: Theodore Demark, PA-C 08/04/2013, 9:31 AM Co-Sign MD Patient seen and examined. I agree with the assessment and plan as detailed above. See also my additional thoughts below.   Please refer also to my progress note from today. I made the decision for the patient to go home. I agree with all the plans made in the note above.  [name], MD, [facility] 08/04/2013 10:23 AM

## 2013-08-04 NOTE — Progress Notes (Signed)
Patient ID: Steve Durham, male   DOB: 01-27-1946, 67 y.o.   MRN: [id number]   SUBJECTIVE:    Patient was transferred to the floor yesterday. He is doing well. He is normal sinus rhythm. He is ready to go home.   Filed Vitals:   08/03/13 0741 08/03/13 1213 08/03/13 2009 08/04/13 0433  BP: 136/56 135/71 121/57 115/70  Pulse: 64 68 68 64  Temp: 98.8 F (37.1 C) 98.2 F (36.8 C) 98.3 F (36.8 C) 98.7 F (37.1 C)  TempSrc: Oral Oral Oral Oral  Resp: 16  18 18   Height:      Weight:    141 lb 8.6 oz (64.2 kg)  SpO2: 97% 100% 97% 100%    Intake/Output Summary (Last 24 hours) at 08/04/13 0756 Last data filed at 08/03/13 1009  Gross per 24 hour  Intake    290 ml  Output    300 ml  Net    -10 ml    LABS: Basic Metabolic Panel:  Recent Labs  40/98/11 0541 08/04/13 0540  NA 141 140  K 3.8 4.3  CL 106 102  CO2 26 28  GLUCOSE 99 103*  BUN 19 22  CREATININE 0.96 1.07  CALCIUM 8.4 9.3   Liver Function Tests: No results found for this basename: AST, ALT, ALKPHOS, BILITOT, PROT, ALBUMIN,  in the last 72 hours No results found for this basename: LIPASE, AMYLASE,  in the last 72 hours CBC:  Recent Labs  08/02/13 0502 08/03/13 0541  WBC 16.8* 13.6*  HGB 14.2 14.1  HCT 42.7 42.3  MCV 97.3 97.2  PLT 202 193   Cardiac Enzymes: No results found for this basename: CKTOTAL, CKMB, CKMBINDEX, TROPONINI,  in the last 72 hours BNP: No components found with this basename: POCBNP,  D-Dimer: No results found for this basename: DDIMER,  in the last 72 hours Hemoglobin A1C: No results found for this basename: HGBA1C,  in the last 72 hours Fasting Lipid Panel: No results found for this basename: CHOL, HDL, LDLCALC, TRIG, CHOLHDL, LDLDIRECT,  in the last 72 hours Thyroid Function Tests: No results found for this basename: TSH, T4TOTAL, FREET3, T3FREE, THYROIDAB,  in the last 72 hours  RADIOLOGY: Dg Chest Port 1 View  08/02/2013   CLINICAL DATA:  Shortness of breath.  EXAM:  PORTABLE CHEST - 1 VIEW  COMPARISON:  08/01/2013.  FINDINGS: Post CABG. Heart size top-normal.  Mild pulmonary vascular prominence.  No segmental consolidation or gross pneumothorax.  Slightly tortuous aorta.  Prominent carotid bifurcation calcifications.  IMPRESSION: Mild pulmonary vascular prominence.  Please see above.   Electronically Signed   By: Bridgett Larsson M.D.   On: 08/02/2013 07:36   Dg Chest Port 1 View  08/01/2013   CLINICAL DATA:  Shortness of breath. Pneumonia.  EXAM: PORTABLE CHEST - 1 VIEW  COMPARISON:  Chest CT from yesterday.  FINDINGS: Interstitial opacities are diffuse and partly chronic. There is an asymmetric opacity in the right mid lung which correlates with possible pneumonia on previous CT. No effusion or pneumothorax. No cardiomegaly. Status post CABG.  IMPRESSION: Improved pulmonary edema. Persistent right mid lung opacity which could represent pneumonia or residual asymmetric edema.   Electronically Signed   By: Tiburcio Pea M.D.   On: 08/01/2013 05:08   Ct Angio Chest Aorta W/cm &/or Wo/cm  07/31/2013   CLINICAL DATA:  Shortness of breath. Substernal chest pain. Evaluate for dissection or pulmonary embolus.  EXAM: CT ANGIOGRAPHY CHEST WITH CONTRAST  TECHNIQUE:  Multidetector CT imaging of the chest was performed using the standard protocol during bolus administration of intravenous contrast. Multiplanar CT image reconstructions including MIPs were obtained to evaluate the vascular anatomy.  CONTRAST:  80mL OMNIPAQUE IOHEXOL 350 MG/ML SOLN  COMPARISON:  Chest CT 08/15/2012  FINDINGS: Prior CABG. Aorta is diffusely calcified without aneurysm or dissection. No filling defects in the pulmonary arteries to suggest pulmonary emboli. Bilateral interstitial thickening and posterior ground-glass opacities. This could reflect edema or infection. This is greater on the right. Mild COPD changes. Trace right pleural effusion. Heart is borderline in size. No mediastinal, hilar, or axillary  adenopathy. Chest wall soft tissues are unremarkable. Imaging into the upper abdomen shows no acute findings. Atherosclerotic irregularity and calcifications in the visualized abdominal aorta without visible aneurysm or dissection.  Review of the MIP images confirms the above findings.  IMPRESSION: No evidence of aortic aneurysm or dissection. No evidence of pulmonary embolus. Aorta is diffusely calcified with irregular plaque in the distal thoracic aorta and visualized upper abdominal aorta.  Bilateral predominantly dependent ground-glass opacities and interstitial thickening. Findings could reflect asymmetric edema or infection. Trace right pleural effusion.  Prior CABG.   Electronically Signed   By: Charlett Nose M.D.   On: 07/31/2013 10:53    PHYSICAL EXAM     He has a cast on his right hand. Lungs reveal scattered rhonchi. Cardiac exam her vitals S1 and S2. There is no edema.   TELEMETRY:     I have reviewed telemetry today August 04, 2013. There is normal sinus rhythm. There is a rare PVC.   ASSESSMENT AND PLAN:     CAD, ARTERY BYPASS GRAFT    Aortic stenosis    Tobacco abuse   Habitual alcohol use  ... withdrawal measures were put in place this admission.    NSTEMI (non-ST elevated myocardial infarction)     The patient has recovered from his cardiac event. He is ready to be discharged home.  Steve Durham 08/04/2013 7:56 AM

## 2013-08-07 LAB — CULTURE, BLOOD (ROUTINE X 2)

## 2013-08-15 ENCOUNTER — Encounter: Payer: Self-pay | Admitting: Physician Assistant

## 2013-08-15 ENCOUNTER — Ambulatory Visit (INDEPENDENT_AMBULATORY_CARE_PROVIDER_SITE_OTHER): Payer: Medicare Other | Admitting: Physician Assistant

## 2013-08-15 VITALS — BP 130/72 | HR 54 | Ht 65.0 in | Wt 147.0 lb

## 2013-08-15 DIAGNOSIS — I10 Essential (primary) hypertension: Secondary | ICD-10-CM

## 2013-08-15 DIAGNOSIS — I350 Nonrheumatic aortic (valve) stenosis: Secondary | ICD-10-CM

## 2013-08-15 DIAGNOSIS — E785 Hyperlipidemia, unspecified: Secondary | ICD-10-CM

## 2013-08-15 DIAGNOSIS — I25810 Atherosclerosis of coronary artery bypass graft(s) without angina pectoris: Secondary | ICD-10-CM

## 2013-08-15 DIAGNOSIS — K219 Gastro-esophageal reflux disease without esophagitis: Secondary | ICD-10-CM

## 2013-08-15 DIAGNOSIS — F17200 Nicotine dependence, unspecified, uncomplicated: Secondary | ICD-10-CM | POA: Diagnosis not present

## 2013-08-15 DIAGNOSIS — I359 Nonrheumatic aortic valve disorder, unspecified: Secondary | ICD-10-CM

## 2013-08-15 DIAGNOSIS — Z720 Tobacco use: Secondary | ICD-10-CM

## 2013-08-15 NOTE — Patient Instructions (Signed)
NO CHANGES WERE MADE TODAY WITH YOUR MEDICATIONS  STOP THE OMEPRAZOLE AND START THE PROTONIX FOR GERD  PLEASE FOLLOW UP WITH DR. Clifton James 10/03/13 @ 2:45

## 2013-08-15 NOTE — Progress Notes (Signed)
[address] [place], [place]  [address] Phone: [phone] Fax:  [phone]  Date:  08/15/2013   ID:  Steve Durham, DOB 21-Feb-1946, MRN [id number]  PCP:  No primary provider on file.  Cardiologist:  Dr. Verne Carrow     History of Present Illness: Steve Durham is a 67 y.o. male who returns for follow up after a recent admission to the hospital.   He has a history of CAD, status post CABG in 1993, HTN, HL, aortic stenosis.  Readmitted October 2013 and cath performed with occlusion of LAD, patent IMA to LAD, occluded proximal RCA with occluded SVG to PDA, severe disease in left main into Circumflex with occluded vein graft to Circumflex. Re-do CABG suggested but his aorta is severely calcified. He was seen by Dr. Tyrone Sage with CT surgery and not felt to be a candidate for re-do bypass. Imdur was added.    He was admitted 10/14-10/18 with a non-STEMI. He awoke on the morning of admission with severe substernal chest pain and nausea/vomiting.  Cardiac markers were abnormal. With ongoing symptoms, he was taken directly to the catheterization lab. LHC (07/31/13):  mLM 60 then 70, pLAD occluded, mLAD 99 after anastomosis of the graft, pCFX 50, OM stent with 70% ISR, pRCA occluded, SVG-PDA occluded, SVG-OM occluded, LIMA-LAD patent. PCI: Promus premier (2.25 x 20 mm) DES to the mid LAD extending back into the IMA graft. Echo (07/31/13) is: EF 55-60%, mild aortic stenosis (mean gradient 13), mild LAE.  Of note, the patient had abnormal findings on chest CT (done to rule out aortic dissection which was negative; negative for pulmonary embolism). He was followed by critical care due to concerns for aspiration pneumonitis. He was diuresed for volume overload he was evaluated by speech therapy but regular diet was recommended.  He was treated with a brief course of antibiotics. He was discharged home on when necessary Lasix.  Since discharge, he has done well. He continues to note  left-sided chest tightness and pressure. This is a chronic symptom for many years without significant change. It is nonexertional.  His presenting angina has completely resolved. He has chronic dyspnea with exertion. He describes NYHA class II-IIb symptoms. He has slept in a recliner for years. There has been no change. He denies PND or edema. He denies syncope.  Labs (10/14):   K 4.3, creatinine 1.07, LDL 73, Hgb 14.1   Wt Readings from Last 3 Encounters:  08/15/13 147 lb (66.679 kg)  08/04/13 141 lb 8.6 oz (64.2 kg)  08/04/13 141 lb 8.6 oz (64.2 kg)     Past Medical History  Diagnosis Date  . Hypertension   . Hyperlipoproteinemia   . Aortic valve disorders     a. Mod AS/AI by cath 07/2012.;  b.  Echo (07/31/13) is: EF 55-60%, mild aortic stenosis (mean gradient 13), mild LAE  . Back pain   . Carotid artery occlusion     a. Dopplers 07/2012: no significant high grade obstruction.  . Hyperlipidemia   . Abnormal CT scan, kidney     07/2012 - multiple small cysts  . Cholelithiasis     Seen on CT 07/2012  . Coronary artery disease     a. CABG 10/1991. b. cath 07/2012 demonstrating occluded SVGs to OM & RCA; segmental LMCA & OM dz, patent IMA w/ insertional dz, moderate AI/AS -- for med rx for now.; c. NSTEMI => LHC (07/31/13):  mLM 60 then 70, pLAD occluded, mLAD 99  after anastomosis of the graft, pCFX 50, OM stent with 70% ISR, pRCA occluded, SVG-PDA occluded, SVG-OM occluded, LIMA-LAD patent. PCI:DES to mLAD ext into IMA graft    Current Outpatient Prescriptions  Medication Sig Dispense Refill  . amLODipine-benazepril (LOTREL) 5-20 MG per capsule Take 2 capsules by mouth daily.  60 capsule  11  . aspirin EC 81 MG EC tablet Take 1 tablet (81 mg total) by mouth daily.      . clopidogrel (PLAVIX) 75 MG tablet Take 1 tablet (75 mg total) by mouth daily with breakfast.  30 tablet  11  . ezetimibe-simvastatin (VYTORIN) 10-40 MG per tablet Take 1 tablet by mouth at bedtime.  30 tablet  11    . fexofenadine (ALLEGRA) 180 MG tablet Take 180 mg by mouth daily as needed. For allergies      . isosorbide mononitrate (IMDUR) 30 MG 24 hr tablet Take 1 tablet (30 mg total) by mouth daily.  30 tablet  11  . metoprolol tartrate (LOPRESSOR) 25 MG tablet Take 50 mg by mouth 2 (two) times daily.       . nicotine (NICODERM CQ - DOSED IN MG/24 HOURS) 14 mg/24hr patch Place 1 patch onto the skin daily. 14 mg patch daily X 2 weeks, then 7 mg patch daily X 2 weeks then stop.  14 patch  0  . nitroGLYCERIN (NITROSTAT) 0.4 MG SL tablet Place 1 tablet (0.4 mg total) under the tongue every 5 (five) minutes as needed for chest pain (up to 3 doses).  25 tablet  6  . Omega-3 Fatty Acids (FISH OIL) 1000 MG CAPS Take 1,000 mg by mouth 2 (two) times daily.         No current facility-administered medications for this visit.    Allergies:   No Known Allergies  Social History:  The patient  reports that he has been smoking.  He does not have any smokeless tobacco history on file. He reports that he drinks about 8.4 ounces of alcohol per week. He reports that he does not use illicit drugs.   Family History:  The patient's family history includes Emphysema in his brother; Heart attack in his mother; Hypertension in his sister.   ROS:  Please see the history of present illness.   He has a chronic cough.   All other systems reviewed and negative.   PHYSICAL EXAM: VS:  BP 130/72  Pulse 54  Ht 5\' 5"  (1.651 m)  Wt 147 lb (66.679 kg)  BMI 24.46 kg/m2 Well nourished, well developed, in no acute distress HEENT: normal Neck: no JVD Cardiac:  normal S1, S2; RRR; 2/6 harsh crescendo decrescendo systolic murmur heard best at the RUSB Lungs:  clear to auscultation bilaterally, no wheezing, rhonchi or rales Abd: soft, nontender, no hepatomegaly Ext: no edema; right groin without hematoma or bruit  Skin: warm and dry Neuro:  CNs 2-12 intact, no focal abnormalities noted  EKG:  Sinus bradycardia, HR 54, inferolateral  T wave inversions, no change from prior tracing     ASSESSMENT AND PLAN:  1. CAD: Patient doing well after recent non-STEMI treated with a DES to the mid LAD extending into the IMA graft.  We discussed the importance of dual antiplatelet therapy. He declines cardiac rehabilitation. 2. Hypertension: Controlled. 3. Hyperlipidemia:  He is on amlodipine.  Patient requests that he remain on his current dose of simvastatin. He has apparently discussed this with Dr. Riley Kill and Dr. Verne Carrow in the past. 4. Tobacco Abuse: He  understands that he needs to quit. 5. Aortic Stenosis: Mild by recent assessment. He will likely need a followup echocardiogram in one year. 6. GERD: Patient was hesitant to change omeprazole to protonix after his most recent admission to the hospital. We discussed the importance of switching him and the potential for interaction with Plavix and omeprazole. He will change to Protonix. 7. Disposition:  F/u with Dr. Verne Carrow in 6-8 weeks.   Signed, Steve Newcomer, PA-C  08/15/2013 12:31 PM

## 2013-09-20 DIAGNOSIS — C44611 Basal cell carcinoma of skin of unspecified upper limb, including shoulder: Secondary | ICD-10-CM | POA: Diagnosis not present

## 2013-09-20 DIAGNOSIS — D235 Other benign neoplasm of skin of trunk: Secondary | ICD-10-CM | POA: Diagnosis not present

## 2013-09-20 DIAGNOSIS — D0460 Carcinoma in situ of skin of unspecified upper limb, including shoulder: Secondary | ICD-10-CM | POA: Diagnosis not present

## 2013-09-20 DIAGNOSIS — L578 Other skin changes due to chronic exposure to nonionizing radiation: Secondary | ICD-10-CM | POA: Diagnosis not present

## 2013-10-03 ENCOUNTER — Encounter: Payer: Self-pay | Admitting: Cardiovascular Disease

## 2013-10-03 ENCOUNTER — Ambulatory Visit (INDEPENDENT_AMBULATORY_CARE_PROVIDER_SITE_OTHER): Payer: Medicare Other | Admitting: Cardiovascular Disease

## 2013-10-03 VITALS — BP 142/60 | HR 60 | Ht 65.0 in | Wt 148.0 lb

## 2013-10-03 DIAGNOSIS — F17200 Nicotine dependence, unspecified, uncomplicated: Secondary | ICD-10-CM | POA: Diagnosis not present

## 2013-10-03 DIAGNOSIS — I10 Essential (primary) hypertension: Secondary | ICD-10-CM

## 2013-10-03 DIAGNOSIS — Z720 Tobacco use: Secondary | ICD-10-CM

## 2013-10-03 DIAGNOSIS — E785 Hyperlipidemia, unspecified: Secondary | ICD-10-CM | POA: Diagnosis not present

## 2013-10-03 DIAGNOSIS — I25810 Atherosclerosis of coronary artery bypass graft(s) without angina pectoris: Secondary | ICD-10-CM

## 2013-10-03 DIAGNOSIS — I359 Nonrheumatic aortic valve disorder, unspecified: Secondary | ICD-10-CM

## 2013-10-03 DIAGNOSIS — I350 Nonrheumatic aortic (valve) stenosis: Secondary | ICD-10-CM

## 2013-10-03 NOTE — Patient Instructions (Signed)
Your physician wants you to follow-up in:  6 months. You will receive a reminder letter in the mail two months in advance. If you don't receive a letter, please call our office to schedule the follow-up appointment.   

## 2013-10-03 NOTE — Progress Notes (Signed)
History of Present Illness: 67 y.o. Male with history of CAD s/p CABG in 1993, HTN, HLD, aortic stenosis here today for follow up. Admitted October 2013 and cath performed with occlusion of LAD, patent IMA to LAD, occluded proximal RCA with occluded SVG to PDA, severe disease in left main into Circumflex with occluded vein graft to Circumflex. Re-do CABG suggested but his aorta is severely calcified. He was seen by Dr. Tyrone Sage with CT surgery and not felt to be a candidate for re-do bypass. Imdur was added. He was admitted 10/14-10/18/14 with a non-STEMI. He awoke on the morning of admission with severe substernal chest pain and nausea/vomiting. Cardiac markers were abnormal. With ongoing symptoms, he was taken directly to the catheterization lab. LHC (07/31/13): mLM 60 then 70, pLAD occluded, mLAD 99 after anastomosis of the graft, pCFX 50, OM stent with 70% ISR, pRCA occluded, SVG-PDA occluded, SVG-OM occluded, LIMA-LAD patent. PCI: Promus premier (2.25 x 20 mm) DES to the mid LAD extending back into the IMA graft. Echo (07/31/13) is: EF 55-60%, mild aortic stenosis (mean gradient 13), mild LAE. Of note, the patient had abnormal findings on chest CT (done to rule out aortic dissection which was negative; negative for pulmonary embolism). He was followed by critical care due to concerns for aspiration pneumonitis. He was diuresed for volume overload he was evaluated by speech therapy but regular diet was recommended. He was treated with a brief course of antibiotics. He was discharged home on Lasix.   He is here today for follow up. He is at his baseline. He has no change in chest pain which he has daily. No change in breathing.   Primary Care Physician: None  Last Lipid Profile:Lipid Panel     Component Value Date/Time   CHOL 161 08/01/2013 0425   TRIG 116 08/01/2013 0425   HDL 65 08/01/2013 0425   CHOLHDL 2.5 08/01/2013 0425   VLDL 23 08/01/2013 0425   LDLCALC 73 08/01/2013 0425    Past  Medical History  Diagnosis Date  . Hypertension   . Hyperlipoproteinemia   . Aortic valve disorders     a. Mod AS/AI by cath 07/2012.;  b.  Echo (07/31/13) is: EF 55-60%, mild aortic stenosis (mean gradient 13), mild LAE  . Back pain   . Carotid artery occlusion     a. Dopplers 07/2012: no significant high grade obstruction.  . Hyperlipidemia   . Abnormal CT scan, kidney     07/2012 - multiple small cysts  . Cholelithiasis     Seen on CT 07/2012  . Coronary artery disease     a. CABG 10/1991. b. cath 07/2012 demonstrating occluded SVGs to OM & RCA; segmental LMCA & OM dz, patent IMA w/ insertional dz, moderate AI/AS -- for med rx for now.; c. NSTEMI => LHC (07/31/13):  mLM 60 then 70, pLAD occluded, mLAD 99 after anastomosis of the graft, pCFX 50, OM stent with 70% ISR, pRCA occluded, SVG-PDA occluded, SVG-OM occluded, LIMA-LAD patent. PCI:DES to mLAD ext into IMA graft    Past Surgical History  Procedure Laterality Date  . Cardiac catheterization  04/03/99  . Carpal tunnel release  2007    Excision mass dorsal left wrist  . Hernia repair  1988  . Coronary artery bypass graft  1993  . Fasciotomy Right 07/30/2013    Procedure: OPEN FASCIOTOMY RIGHT RING FINGER & RIGHT SMALL FINGER MULTIPLE LEVELS;  Surgeon: Nicki Reaper, MD;  Location: [facility];  Service: Orthopedics;  Laterality: Right;    Current Outpatient Prescriptions  Medication Sig Dispense Refill  . amLODipine-benazepril (LOTREL) 5-20 MG per capsule Take 2 capsules by mouth daily.  60 capsule  11  . aspirin EC 81 MG EC tablet Take 1 tablet (81 mg total) by mouth daily.      . clopidogrel (PLAVIX) 75 MG tablet Take 1 tablet (75 mg total) by mouth daily with breakfast.  30 tablet  11  . ezetimibe-simvastatin (VYTORIN) 10-40 MG per tablet Take 1 tablet by mouth at bedtime.  30 tablet  11  . fexofenadine (ALLEGRA) 180 MG tablet Take 180 mg by mouth daily as needed. For allergies      . isosorbide mononitrate  (IMDUR) 30 MG 24 hr tablet Take 1 tablet (30 mg total) by mouth daily.  30 tablet  11  . metoprolol tartrate (LOPRESSOR) 25 MG tablet Take 50 mg by mouth 2 (two) times daily.       . nicotine (NICODERM CQ - DOSED IN MG/24 HOURS) 14 mg/24hr patch Place 1 patch onto the skin daily. 14 mg patch daily X 2 weeks, then 7 mg patch daily X 2 weeks then stop.  14 patch  0  . nitroGLYCERIN (NITROSTAT) 0.4 MG SL tablet Place 1 tablet (0.4 mg total) under the tongue every 5 (five) minutes as needed for chest pain (up to 3 doses).  25 tablet  6  . Omega-3 Fatty Acids (FISH OIL) 1000 MG CAPS Take 1,000 mg by mouth 2 (two) times daily.        . pantoprazole (PROTONIX) 40 MG tablet Take 40 mg by mouth daily.       No current facility-administered medications for this visit.    No Known Allergies  History   Social History  . Marital Status: Married    Spouse Name: N/A    Number of Children: 1  . Years of Education: N/A   Occupational History  . Plumbing     Retired in 2007   Social History Main Topics  . Smoking status: Current Some Day Smoker -- 0.50 packs/day for 50 years  . Smokeless tobacco: Not on file     Comment: has cut back   . Alcohol Use: 8.4 oz/week    14 Cans of beer per week     Comment: daily-6 daily  . Drug Use: No  . Sexual Activity: Not on file   Other Topics Concern  . Not on file   Social History Narrative  . No narrative on file    Family History  Problem Relation Age of Onset  . Heart attack Mother     MI  . Hypertension Sister   . Emphysema Brother     Review of Systems:  As stated in the HPI and otherwise negative.   BP 142/60  Pulse 60  Ht 5\' 5"  (1.651 m)  Wt 148 lb (67.132 kg)  BMI 24.63 kg/m2  Physical Examination: General: Well developed, well nourished, NAD HEENT: OP clear, mucus membranes moist SKIN: warm, dry. No rashes. Neuro: No focal deficits Musculoskeletal: Muscle strength 5/5 all ext Psychiatric: Mood and affect normal Neck: No JVD,  no carotid bruits, no thyromegaly, no lymphadenopathy. Lungs:Clear bilaterally, no wheezes, rhonci, crackles Cardiovascular: Regular rate and rhythm. No murmurs, gallops or rubs. Abdomen:Soft. Bowel sounds present. Non-tender.  Extremities: No lower extremity edema. Pulses are 2 + in the bilateral DP/PT.  Echo 07/31/13: Left ventricle: The cavity size was normal. Systolic function was normal.  The estimated ejection fraction was in the range of 55% to 60%. Wall motion was normal; there were no regional wall motion abnormalities. - Aortic valve: There was mild stenosis. Valve area: 1.44cm^2(VTI). Valve area: 1.51cm^2 (Vmax). - Left atrium: The atrium was mildly dilated. - Atrial septum: No defect or patent foramen ovale  Cardiac cath October 2014: Left main: The vessel is diffusely diseased. The mid vessel has diffuse 60% stenosis and tapers down to a 70% stenosis just before the bifurcation.  Left Anterior Descending Artery: 100% proximal occlusion. The mid and distal vessel fills from the patent IMA graft. The mid LAD has a 99% stenosis after the anastomosis of the graft. There are several diagonal branches. The first diagonal fills from left to left collaterals. The second diagonal fills from the IMA graft.  Circumflex Artery: Large caliber vessel with large obtuse marginal branch. There is diffuse disease in the circumflex extending down into the mid segment of the obtuse marginal branch. The proximal Circumflex has diffuse 50% stenosis. The obtuse marginal branch has a stented segment. There is diffuse 70% stent restenosis. This appears stable from last cath.  Right Coronary Artery: 100% proximal occlusion. The mid and distal vessel is seen to fill from left to right collaterals.  Graft Anatomy:  SVG to PDA is known to be occluded and was not selectively engaged.  SVG to OM is known to be occluded and was not selectively engaged.  LIMA to mid LAD is patent. There is a 99% stenosis in the  mid LAD just beyond the graft insertion site.  Left Ventricular Angiogram: Deferred.    Assessment and Plan:   1. CAD: s/p CABG 1993 and most recent cath October 2014 with patent IMA to LAD but severe native vessel LAD disease beyond graft insertion, occlusion of both vein grafts to the PDA and Circumflex. He is not a redo CABG candidate. Emergent PCI of native LAD with DES. Doing well. Continue current meds.   2. CAROTID ARTERY DISEASE: The patient had Dopplers done in the fall of 2013 and does not have significant carotid disease   3. Hyperlipidemia: Continue statin. Lipids controlled.   4. Aortic valve stenosis: Mild by echo October 2014.   5. HTN: BP controlled.   6. Tobacco abuse: Smoking cessation encouraged.

## 2013-10-04 DIAGNOSIS — D0460 Carcinoma in situ of skin of unspecified upper limb, including shoulder: Secondary | ICD-10-CM | POA: Diagnosis not present

## 2014-01-14 DIAGNOSIS — L570 Actinic keratosis: Secondary | ICD-10-CM | POA: Diagnosis not present

## 2014-01-14 DIAGNOSIS — D0460 Carcinoma in situ of skin of unspecified upper limb, including shoulder: Secondary | ICD-10-CM | POA: Diagnosis not present

## 2014-02-28 ENCOUNTER — Encounter (HOSPITAL_COMMUNITY): Payer: Self-pay | Admitting: Cardiology

## 2014-02-28 ENCOUNTER — Ambulatory Visit (HOSPITAL_COMMUNITY): Admit: 2014-02-28 | Payer: Self-pay | Admitting: Cardiovascular Disease

## 2014-02-28 ENCOUNTER — Inpatient Hospital Stay (HOSPITAL_COMMUNITY)
Admission: EM | Admit: 2014-02-28 | Discharge: 2014-03-02 | DRG: 247 | Disposition: A | Discharge status: Home / Self Care | Payer: Medicare Other | Attending: Cardiovascular Disease | Admitting: Cardiovascular Disease

## 2014-02-28 ENCOUNTER — Encounter (HOSPITAL_COMMUNITY)
Admission: EM | Disposition: A | Discharge status: Home / Self Care | Payer: Self-pay | Source: Home / Self Care | Attending: Cardiovascular Disease

## 2014-02-28 DIAGNOSIS — I6529 Occlusion and stenosis of unspecified carotid artery: Secondary | ICD-10-CM | POA: Diagnosis not present

## 2014-02-28 DIAGNOSIS — Z951 Presence of aortocoronary bypass graft: Secondary | ICD-10-CM | POA: Diagnosis not present

## 2014-02-28 DIAGNOSIS — E785 Hyperlipidemia, unspecified: Secondary | ICD-10-CM | POA: Diagnosis present

## 2014-02-28 DIAGNOSIS — I2510 Atherosclerotic heart disease of native coronary artery without angina pectoris: Secondary | ICD-10-CM

## 2014-02-28 DIAGNOSIS — F17210 Nicotine dependence, cigarettes, uncomplicated: Secondary | ICD-10-CM | POA: Diagnosis present

## 2014-02-28 DIAGNOSIS — I359 Nonrheumatic aortic valve disorder, unspecified: Secondary | ICD-10-CM | POA: Diagnosis present

## 2014-02-28 DIAGNOSIS — I351 Nonrheumatic aortic (valve) insufficiency: Secondary | ICD-10-CM

## 2014-02-28 DIAGNOSIS — I2119 ST elevation (STEMI) myocardial infarction involving other coronary artery of inferior wall: Secondary | ICD-10-CM | POA: Diagnosis not present

## 2014-02-28 DIAGNOSIS — R079 Chest pain, unspecified: Secondary | ICD-10-CM | POA: Diagnosis not present

## 2014-02-28 DIAGNOSIS — R791 Abnormal coagulation profile: Secondary | ICD-10-CM | POA: Diagnosis present

## 2014-02-28 DIAGNOSIS — Z9861 Coronary angioplasty status: Secondary | ICD-10-CM

## 2014-02-28 DIAGNOSIS — T82897A Other specified complication of cardiac prosthetic devices, implants and grafts, initial encounter: Secondary | ICD-10-CM | POA: Diagnosis not present

## 2014-02-28 DIAGNOSIS — Z8249 Family history of ischemic heart disease and other diseases of the circulatory system: Secondary | ICD-10-CM

## 2014-02-28 DIAGNOSIS — I2589 Other forms of chronic ischemic heart disease: Secondary | ICD-10-CM | POA: Diagnosis present

## 2014-02-28 DIAGNOSIS — I252 Old myocardial infarction: Secondary | ICD-10-CM | POA: Diagnosis not present

## 2014-02-28 DIAGNOSIS — Y831 Surgical operation with implant of artificial internal device as the cause of abnormal reaction of the patient, or of later complication, without mention of misadventure at the time of the procedure: Secondary | ICD-10-CM | POA: Diagnosis present

## 2014-02-28 DIAGNOSIS — I249 Acute ischemic heart disease, unspecified: Secondary | ICD-10-CM

## 2014-02-28 DIAGNOSIS — E8779 Other fluid overload: Secondary | ICD-10-CM | POA: Diagnosis present

## 2014-02-28 DIAGNOSIS — F17200 Nicotine dependence, unspecified, uncomplicated: Secondary | ICD-10-CM | POA: Diagnosis present

## 2014-02-28 DIAGNOSIS — I350 Nonrheumatic aortic (valve) stenosis: Secondary | ICD-10-CM | POA: Diagnosis present

## 2014-02-28 DIAGNOSIS — I213 ST elevation (STEMI) myocardial infarction of unspecified site: Secondary | ICD-10-CM

## 2014-02-28 DIAGNOSIS — I255 Ischemic cardiomyopathy: Secondary | ICD-10-CM

## 2014-02-28 DIAGNOSIS — Z720 Tobacco use: Secondary | ICD-10-CM

## 2014-02-28 DIAGNOSIS — I2129 ST elevation (STEMI) myocardial infarction involving other sites: Secondary | ICD-10-CM

## 2014-02-28 DIAGNOSIS — I219 Acute myocardial infarction, unspecified: Secondary | ICD-10-CM | POA: Diagnosis not present

## 2014-02-28 DIAGNOSIS — I25810 Atherosclerosis of coronary artery bypass graft(s) without angina pectoris: Secondary | ICD-10-CM | POA: Diagnosis present

## 2014-02-28 DIAGNOSIS — I10 Essential (primary) hypertension: Secondary | ICD-10-CM | POA: Diagnosis present

## 2014-02-28 HISTORY — DX: Ischemic cardiomyopathy: I25.5

## 2014-02-28 HISTORY — PX: LEFT HEART CATHETERIZATION WITH CORONARY ANGIOGRAM: SHX5451

## 2014-02-28 HISTORY — DX: Nonrheumatic aortic (valve) stenosis: I35.0

## 2014-02-28 LAB — COMPREHENSIVE METABOLIC PANEL
ALT: 42 U/L (ref 0–53)
AST: 226 U/L — AB (ref 0–37)
Albumin: 4.1 g/dL (ref 3.5–5.2)
Alkaline Phosphatase: 86 U/L (ref 39–117)
BUN: 17 mg/dL (ref 6–23)
CALCIUM: 9.2 mg/dL (ref 8.4–10.5)
CO2: 22 mEq/L (ref 19–32)
Chloride: 99 mEq/L (ref 96–112)
Creatinine, Ser: 0.84 mg/dL (ref 0.50–1.35)
GFR calc Af Amer: >90 mL/min (ref >90)
GFR calc non Af Amer: 87 mL/min — ABNORMAL LOW (ref >90)
Glucose, Bld: 124 mg/dL — ABNORMAL HIGH (ref 70–99)
Potassium: 3.8 mEq/L (ref 3.7–5.3)
Sodium: 140 mEq/L (ref 137–147)
Total Bilirubin: 1.2 mg/dL (ref 0.3–1.2)
Total Protein: 7.6 g/dL (ref 6.0–8.3)

## 2014-02-28 LAB — POCT I-STAT, CHEM 8
BUN: 18 mg/dL (ref 6–23)
Calcium, Ion: 1.1 mmol/L — ABNORMAL LOW (ref 1.13–1.30)
Chloride: 106 mEq/L (ref 96–112)
Creatinine, Ser: 0.8 mg/dL (ref 0.50–1.35)
GLUCOSE: 151 mg/dL — AB (ref 70–99)
HCT: 53 % — ABNORMAL HIGH (ref 39.0–52.0)
HEMOGLOBIN: 18 g/dL — AB (ref 13.0–17.0)
Potassium: 3.4 mEq/L — ABNORMAL LOW (ref 3.7–5.3)
Sodium: 143 mEq/L (ref 137–147)
TCO2: 17 mmol/L (ref 0–100)

## 2014-02-28 LAB — POCT I-STAT 3, ART BLOOD GAS (G3+)
Acid-base deficit: 6 mmol/L — ABNORMAL HIGH (ref 0.0–2.0)
Bicarbonate: 17.9 mEq/L — ABNORMAL LOW (ref 20.0–24.0)
O2 SAT: 90 %
PCO2 ART: 31.8 mmHg — AB (ref 35.0–45.0)
PO2 ART: 59 mmHg — AB (ref 80.0–100.0)
TCO2: 19 mmol/L (ref 0–100)
pH, Arterial: 7.36 (ref 7.350–7.450)

## 2014-02-28 LAB — CBC WITH DIFFERENTIAL/PLATELET
BASOS ABS: 0 10*3/uL (ref 0.0–0.1)
BASOS PCT: 0 % (ref 0–1)
EOS ABS: 0 10*3/uL (ref 0.0–0.7)
EOS PCT: 0 % (ref 0–5)
HEMATOCRIT: 50.3 % (ref 39.0–52.0)
Hemoglobin: 17.1 g/dL — ABNORMAL HIGH (ref 13.0–17.0)
Lymphocytes Relative: 4 % — ABNORMAL LOW (ref 12–46)
Lymphs Abs: 0.8 10*3/uL (ref 0.7–4.0)
MCH: 32.8 pg (ref 26.0–34.0)
MCHC: 34 g/dL (ref 30.0–36.0)
MCV: 96.4 fL (ref 78.0–100.0)
MONO ABS: 1.7 10*3/uL — AB (ref 0.1–1.0)
Monocytes Relative: 10 % (ref 3–12)
Neutro Abs: 15.3 10*3/uL — ABNORMAL HIGH (ref 1.7–7.7)
Neutrophils Relative %: 86 % — ABNORMAL HIGH (ref 43–77)
Platelets: 228 10*3/uL (ref 150–400)
RBC: 5.22 MIL/uL (ref 4.22–5.81)
RDW: 13.2 % (ref 11.5–15.5)
WBC: 17.8 10*3/uL — ABNORMAL HIGH (ref 4.0–10.5)

## 2014-02-28 LAB — POCT ACTIVATED CLOTTING TIME
ACTIVATED CLOTTING TIME: 143 s
Activated Clotting Time: 382 seconds

## 2014-02-28 LAB — TROPONIN I: Troponin I: >20 ng/mL (ref <0.30)

## 2014-02-28 LAB — CK TOTAL AND CKMB (NOT AT ARMC)
CK, MB: 203.9 ng/mL — AB (ref 0.3–4.0)
Relative Index: 7.7 — ABNORMAL HIGH (ref 0.0–2.5)
Total CK: 2647 U/L — ABNORMAL HIGH (ref 7–232)

## 2014-02-28 LAB — PROTIME-INR
INR: 4.69 — AB (ref 0.00–1.49)
Prothrombin Time: 42.3 seconds — ABNORMAL HIGH (ref 11.6–15.2)

## 2014-02-28 LAB — MRSA PCR SCREENING: MRSA by PCR: NEGATIVE

## 2014-02-28 SURGERY — LEFT HEART CATHETERIZATION WITH CORONARY ANGIOGRAM
Anesthesia: LOCAL

## 2014-02-28 MED ORDER — FISH OIL 1000 MG PO CAPS: 1000.0000 mg | ORAL_CAPSULE | Freq: Two times a day (BID) | ORAL | Status: DC

## 2014-02-28 MED ORDER — CLOPIDOGREL BISULFATE 75 MG PO TABS: 75.0000 mg | ORAL_TABLET | Freq: Every day | ORAL | Status: DC

## 2014-02-28 MED ORDER — ASPIRIN EC 81 MG PO TBEC: 81.0000 mg | DELAYED_RELEASE_TABLET | Freq: Every day | ORAL | Status: DC

## 2014-02-28 MED ORDER — CLOPIDOGREL BISULFATE 75 MG PO TABS
ORAL_TABLET | ORAL | Status: AC
  Filled 2014-02-28: qty 2

## 2014-02-28 MED ORDER — MORPHINE SULFATE 10 MG/ML IJ SOLN
INTRAMUSCULAR | Status: AC
Start: 2014-02-28 — End: 2014-02-28
  Filled 2014-02-28: qty 1

## 2014-02-28 MED ORDER — BIVALIRUDIN 250 MG IV SOLR
INTRAVENOUS | Status: AC
  Filled 2014-02-28: qty 250

## 2014-02-28 MED ORDER — HEPARIN (PORCINE) IN NACL 2-0.9 UNIT/ML-% IJ SOLN
INTRAMUSCULAR | Status: AC
  Filled 2014-02-28: qty 1000

## 2014-02-28 MED ORDER — PANTOPRAZOLE SODIUM 40 MG PO TBEC
40.0000 mg | DELAYED_RELEASE_TABLET | Freq: Every day | ORAL | Status: DC
  Administered 2014-03-01 – 2014-03-02 (×2): 40 mg via ORAL
  Filled 2014-02-28: qty 1

## 2014-02-28 MED ORDER — ATROPINE SULFATE 0.1 MG/ML IJ SOLN
INTRAMUSCULAR | Status: DC
Start: 2014-02-28 — End: 2014-02-28
  Filled 2014-02-28: qty 10

## 2014-02-28 MED ORDER — FUROSEMIDE 10 MG/ML IJ SOLN
INTRAMUSCULAR | Status: AC
  Filled 2014-02-28: qty 4

## 2014-02-28 MED ORDER — NITROGLYCERIN IN D5W 200-5 MCG/ML-% IV SOLN
2.0000 ug/min | INTRAVENOUS | Status: DC
  Administered 2014-02-28: 20 ug/min via INTRAVENOUS
  Filled 2014-02-28: qty 250

## 2014-02-28 MED ORDER — ONDANSETRON HCL 4 MG/2ML IJ SOLN
4.0000 mg | Freq: Four times a day (QID) | INTRAMUSCULAR | Status: DC | PRN
  Administered 2014-03-01: 4 mg via INTRAVENOUS
  Filled 2014-02-28: qty 2

## 2014-02-28 MED ORDER — LORATADINE 10 MG PO TABS
10.0000 mg | ORAL_TABLET | Freq: Every day | ORAL | Status: DC
  Administered 2014-02-28 – 2014-03-02 (×3): 10 mg via ORAL
  Filled 2014-02-28 (×3): qty 1

## 2014-02-28 MED ORDER — MIDAZOLAM HCL 2 MG/2ML IJ SOLN
INTRAMUSCULAR | Status: AC
  Filled 2014-02-28: qty 2

## 2014-02-28 MED ORDER — NITROGLYCERIN 0.2 MG/ML ON CALL CATH LAB
INTRAVENOUS | Status: AC
  Filled 2014-02-28: qty 1

## 2014-02-28 MED ORDER — ACETAMINOPHEN 325 MG PO TABS
650.0000 mg | ORAL_TABLET | ORAL | Status: DC | PRN
  Filled 2014-02-28: qty 2

## 2014-02-28 MED ORDER — SODIUM CHLORIDE 0.9 % IV SOLN
0.2500 mg/kg/h | Freq: Once | INTRAVENOUS | Status: AC
  Administered 2014-02-28: 1.75 mg/kg/h via INTRAVENOUS
  Filled 2014-02-28: qty 250

## 2014-02-28 MED ORDER — ATORVASTATIN CALCIUM 80 MG PO TABS
80.0000 mg | ORAL_TABLET | Freq: Every day | ORAL | Status: DC
  Administered 2014-02-28 – 2014-03-01 (×2): 80 mg via ORAL
  Filled 2014-02-28 (×4): qty 1

## 2014-02-28 MED ORDER — FENTANYL CITRATE 0.05 MG/ML IJ SOLN
INTRAMUSCULAR | Status: AC
Start: 2014-02-28 — End: 2014-02-28
  Filled 2014-02-28: qty 2

## 2014-02-28 MED ORDER — ASPIRIN EC 81 MG PO TBEC
81.0000 mg | DELAYED_RELEASE_TABLET | Freq: Every day | ORAL | Status: DC
  Administered 2014-02-28 – 2014-03-02 (×3): 81 mg via ORAL
  Filled 2014-02-28 (×3): qty 1

## 2014-02-28 MED ORDER — METOPROLOL TARTRATE 50 MG PO TABS
50.0000 mg | ORAL_TABLET | Freq: Two times a day (BID) | ORAL | Status: DC
  Filled 2014-02-28 (×2): qty 1

## 2014-02-28 MED ORDER — SODIUM CHLORIDE 0.9 % IV SOLN: INTRAVENOUS | Status: DC

## 2014-02-28 MED ORDER — NITROGLYCERIN 0.4 MG SL SUBL
0.4000 mg | SUBLINGUAL_TABLET | SUBLINGUAL | Status: DC | PRN
  Administered 2014-03-01: 0.4 mg via SUBLINGUAL
  Filled 2014-02-28: qty 1
  Filled 2014-02-28: qty 25

## 2014-02-28 MED ORDER — ZOLPIDEM TARTRATE 5 MG PO TABS
5.0000 mg | ORAL_TABLET | Freq: Every evening | ORAL | Status: DC | PRN
  Administered 2014-03-01: 5 mg via ORAL
  Filled 2014-02-28: qty 1

## 2014-02-28 MED ORDER — OMEGA-3-ACID ETHYL ESTERS 1 G PO CAPS
1.0000 g | ORAL_CAPSULE | Freq: Two times a day (BID) | ORAL | Status: DC
  Administered 2014-02-28 – 2014-03-02 (×4): 1 g via ORAL
  Filled 2014-02-28 (×5): qty 1

## 2014-02-28 MED ORDER — METOPROLOL TARTRATE 25 MG PO TABS
25.0000 mg | ORAL_TABLET | Freq: Two times a day (BID) | ORAL | Status: DC
  Administered 2014-02-28 – 2014-03-02 (×4): 25 mg via ORAL
  Filled 2014-02-28 (×4): qty 1

## 2014-02-28 MED ORDER — FUROSEMIDE 10 MG/ML IJ SOLN
INTRAMUSCULAR | Status: AC
Start: 2014-02-28 — End: 2014-02-28
  Filled 2014-02-28: qty 4

## 2014-02-28 MED ORDER — LIDOCAINE HCL (PF) 1 % IJ SOLN
INTRAMUSCULAR | Status: AC
Start: 2014-02-28 — End: 2014-02-28
  Filled 2014-02-28: qty 30

## 2014-02-28 MED ORDER — BIVALIRUDIN 250 MG IV SOLR
0.2500 mg/kg/h | Freq: Once | INTRAVENOUS | Status: AC
  Administered 2014-02-28: 1.75 mg/kg/h via INTRAVENOUS
  Filled 2014-02-28: qty 250

## 2014-02-28 MED ORDER — ALPRAZOLAM 0.25 MG PO TABS
0.2500 mg | ORAL_TABLET | Freq: Two times a day (BID) | ORAL | Status: DC | PRN
  Administered 2014-02-28: 0.25 mg via ORAL
  Filled 2014-02-28: qty 1

## 2014-02-28 MED ORDER — CLOPIDOGREL BISULFATE 75 MG PO TABS
75.0000 mg | ORAL_TABLET | Freq: Every day | ORAL | Status: DC
  Administered 2014-03-01 – 2014-03-02 (×2): 75 mg via ORAL
  Filled 2014-02-28 (×2): qty 1

## 2014-02-28 MED ORDER — EZETIMIBE-SIMVASTATIN 10-40 MG PO TABS
1.0000 | ORAL_TABLET | Freq: Every day | ORAL | Status: DC
  Administered 2014-02-28 – 2014-03-01 (×2): 1 via ORAL
  Filled 2014-02-28 (×3): qty 1

## 2014-02-28 NOTE — Progress Notes (Signed)
CRITICAL VALUE ALERT  Critical value received:  CKMB=203.9  Date of notification:  02/28/2014  Time of notification:  2307  Critical value read back:yes  Nurse who received alert:  G.Radford Pax RN  MD notified (1st page):  No one; expected result  Time of first page:  Expected result  MD notified (2nd page):  Time of second page:  Responding MD:  Expected result   Time MD responded:  Expected result

## 2014-02-28 NOTE — Progress Notes (Signed)
Right femoral arterial sheath removed per protocol at 2154.  Manual pressure held for 20 minutes until hemostasis achieved.  No bleeding, no palpable hematoma noted. Sterile dressing to site.  Patient tolerated well.

## 2014-02-28 NOTE — ED Provider Notes (Signed)
Patient presents with chest pain that started during the night last night. See Dr. Kevan Ny ad lib. hour with multiple previous MIs and stands. Last 10 August or October of last year. EKG initially with EMS showed inferior lateral ST depressions. However upon arrival to her last EKG just a few minutes out shows obvious inferior lateral ST elevations. Patient was not removed from the EMS cart Was not examined here by me. Dr. Burt Knack was present and the patient is being taken directly to the Cath Lab for PCI.  Tanna Furry, MD 02/28/14 [phone]

## 2014-02-28 NOTE — Progress Notes (Signed)
Patient complained of left sided chest pain shooting through to scapula at 2100.  Nitroglycerin drip rate increased to 75 mcg/min. BP 151/81.   Discomfort rated at 1-2/10.  Denied SOB or dizziness.  EKG  Performed with slight changes noted.  Dr Aundra Dubin called and updated.  No new orders.  Patient admits that discomfort has been present since immediately post procedure.  Will continue to closely monitor.

## 2014-02-28 NOTE — H&P (Signed)
Patient ID: Steve Durham MRN: [id number], DOB/AGE: 06/25/1946   Admit date: 02/28/2014   Primary Physician: No primary provider on file. Primary Cardiologist: Dr Julianne Handler  HPI:   68 y/o male with a history of CAD, status post CABG in 1993, HTN, HL, and mild AS.  He was last admitted as a STEMI 07/30/13.  Cath showed occlusion of the LAD after the graft insertion with a patent IMA to LAD, occluded proximal RCA with occluded SVG to PDA, severe disease in left main into Circumflex with occluded vein graft to Circumflex. Re-do CABG considered but his aorta was severely calcified. He was seen by Dr. Servando Snare and not felt to be a candidate for re-do bypass.  He underwent Promus premier (2.25 x 20 mm) DES to the mid LAD extending back into the IMA graft. Echo (07/31/13) is: EF 55-60%, mild aortic stenosis (mean gradient 13), mild LAE. His course was complicated by pneumonitis. He last saw Dr Julianne Handler 10/03/13.               He presents this afternoon as a STEMI via EMS. The pt has been having chest painoff and on for several days. He started having during the night. He has taken a total of 12 NTG before calling EMS. He continues to have chest and is SOB and mottled in the ER. EKG reveals ST elevation 3 and AVF with ST depression V2-V6 and AVL. His B/P was stable on arrival to the ER. Pt was taken urgently to the cath lab.     Problem List: Past Medical History  Diagnosis Date  . Hypertension   . Hyperlipoproteinemia   . Aortic valve disorders     a. Mod AS/AI by cath 07/2012.;  b.  Echo (07/31/13) is: EF 55-60%, mild aortic stenosis (mean gradient 13), mild LAE  . Back pain   . Carotid artery occlusion     a. Dopplers 07/2012: no significant high grade obstruction.  . Hyperlipidemia   . Abnormal CT scan, kidney     07/2012 - multiple small cysts  . Cholelithiasis     Seen on CT 07/2012  . Coronary artery disease     a. CABG 10/1991. b. cath 07/2012 demonstrating occluded SVGs to OM &  RCA; segmental LMCA & OM dz, patent IMA w/ insertional dz, moderate AI/AS -- for med rx for now.; c. NSTEMI => LHC (07/31/13):  mLM 60 then 70, pLAD occluded, mLAD 99 after anastomosis of the graft, pCFX 50, OM stent with 70% ISR, pRCA occluded, SVG-PDA occluded, SVG-OM occluded, LIMA-LAD patent. PCI:DES to mLAD ext into IMA graft    Past Surgical History  Procedure Laterality Date  . Cardiac catheterization  04/03/99  . Carpal tunnel release  2007    Excision mass dorsal left wrist  . Hernia repair  1988  . Coronary artery bypass graft  1993  . Fasciotomy Right 07/30/2013    Procedure: OPEN FASCIOTOMY RIGHT RING FINGER & RIGHT SMALL FINGER MULTIPLE LEVELS;  Surgeon: Wynonia Sours, MD;  Location: [facility];  Service: Orthopedics;  Laterality: Right;     Allergies: No Known Allergies   Home Medications No current facility-administered medications for this encounter.     Family History  Problem Relation Age of Onset  . Heart attack Mother     MI  . Hypertension Sister   . Emphysema Brother      History   Social History  . Marital Status: Married  Spouse Name: N/A    Number of Children: 1  . Years of Education: N/A   Occupational History  . Plumbing     Retired in 2007   Social History Main Topics  . Smoking status: Current Some Day Smoker -- 0.50 packs/day for 50 years  . Smokeless tobacco: Not on file     Comment: has cut back   . Alcohol Use: 8.4 oz/week    14 Cans of beer per week     Comment: daily-6 daily  . Drug Use: No  . Sexual Activity: Not on file   Other Topics Concern  . Not on file   Social History Narrative  . No narrative on file     Review of Systems:  ROS limited as pt was in acute distress   Physical Exam: There were no vitals taken for this visit.  Pt is very tachypneic, coughing General appearance: alert, cooperative, moderate distress and SOB, mottled skin Neck: no carotid bruit and no JVD Lungs: decreased breath  sounds, no obvious rales or wheezing Heart: regular rate and rhythm Abdomen: not distended, non tender Extremities: No edema Pulses: diminnished Skin: coll, pale, diaphoretic Neurologic: Grossly normal    Labs:  No results found for this or any previous visit (from the past 24 hour(s)).   Radiology/Studies: No results found.  EKG: NSR- Posterior lateral acute MI  ASSESSMENT AND PLAN:  Principal Problem:   STEMI- post/lat 02/28/14 Active Problems:   CABG '98, cath Oct 2014- LAD DES placed.   HYPERTENSION   Aortic stenosis- mild Oct 2014   Hyperlipidemia   Tobacco abuse   PLAN: Urgent cath.    Signed, Erlene Quan, PA-C 02/28/2014, 1:34 PM   Patient seen and examined. Agree with assessment and plan. Agree with emergent cath and PCI if indicated. Pt appears to be in pulmonary edema. Will give IV Lasix and MSO4 acutely. He has known occlusion of vein grafts, as well as native RCA. With ECG findingsconcern for LCX  territory with  jeopardized RCA collaterals.   Steve Sine, MD, [facility] 02/28/2014 3:31 PM

## 2014-02-28 NOTE — CV Procedure (Signed)
Steve Durham is a 68 y.o. male    [id number]  [id number] LOCATION:  FACILITY: [place] [facility]  PHYSICIAN: Troy Sine, MD, [facility] 1945-12-28   DATE OF PROCEDURE:  02/28/2014    EMERGENT CARDIAC CATHETERIZATION/PERCUTANEOUS CORONARY INTERVENTION     HISTORY:    Steve Durham is a 68 y.o. male with a history of CAD, status post CABG in 1993, HTN, Hyperlipidemia, and mild AS. He was last admitted as a STEMI 07/30/13. Cath showed subtotal occlusion of the LAD after the graft insertion with a patent IMA to LAD, occluded proximal RCA with occluded SVG to PDA, 60 - 70% left main , 70 % diffuse in stent  In the proximal LCX with an occluded vein graft to Circumflex. Re-do CABG was considered but his aorta was severely calcified. He was seen by Dr. Servando Snare and not felt to be a candidate for re-do bypass. He underwent Promus premier (2.25 x 20 mm) DES to the mid LAD extending back into the IMA graft. Echo (07/31/13) revealed EF 55-60%, mild aortic stenosis (mean gradient 13), mild LAE.  He presents this afternoon as a STEMI via EMS. The pt has been having chest pain intermittently for several days. He started having during the night. He has taken a total of 12 NTG before calling EMS. He continues to have chest and is SOB and mottled in the ER. EKG reveals ST elevation 3 and AVF with ST depression V2-V6 and AVL. His B/P was stable on arrival to the ER. Pt was taken urgently to the cath lab.     PROCEDURE:  Left heart catheterization: Coronary angiography; selective angiography into both vein grafts; selective angiography into the left internal mammary artery; 2 vessel percutaneous coronary intervention with PTCA and stenting of the left main into the proximal circumflex and PTCA/Angiosculpt scoring balloon in the proximal circumflex in-stent restenotic segment.  The patient arrived to the catheterization laboratory, severely tachypneic, coughing, and mottled and was still experiencing chest discomfort.   His right femoral artery was punctured anteriorly and a 6 French sheath was inserted without difficulty.  Lasix IV 40 mg x2 and morphine sulfate 2 mg x2 were administered.  Diagnostic catheterization was done utilizing 6 French Judkins left and right catheters, as well as a left internal mammary artery catheter.  LIMA catheter was also able to cross the aortic valve with wire support and left ventricular pressures were recorded.  The patient received additional morphine for preload reduction.  A Foley catheter was also inserted. With the demonstration of a new 99% distal left main stenosis  felt to be accounting for his severe cardiac compromise with pulmonary edema intervention to this vessel was performed.  Angiomax bolus plus infusion was administered.  At the start of the procedure the patient also had an i-STAT performed.  He had already taken his morning Plavix at home and he received an additional 150 mg of clopidogrel.  A 6 French XB 3.5 guide used for the intervention.  A [place] wire  was able to cross the distal 99% left main stenosis and was advanced into the distal left circumflex vessel. Initial dilatation with a Euphora 2.0x15 mm balloon was made in the distal left main extending into the very proximal circumflex.  Very short inflations at 6,12, and 14 atmospheres were made up to a maximum of 12 seconds for each inflation.  The patient had exacerbation of his cough with each inflation. The balloon was then exchanged upgraded to an 2.5x20 mm Empira balloon.  3 inflations at 11,13, and 13 atmospheres were performed, ranging from 15-20 seconds.  An attempt was made to pass this balloon into the proximal circumflex, but this was not able to get into the previously stented proximal circumflex segment with in-stent restenosis, but was unable to cross.  An Angiosculpt 2.0x10 mm scoring balloon was then inserted, but this was also unable to cross into the in-stent restenosis in the proximal circumflex.  The   Euphora 2.0x15 mm balloon was then reinserted and low-level dilatation was done in the previously placed proximal stent in the  circumflex.  Following 2 inflations at 11 and 14 atmospheres, this was then removed and the angiosculpt balloon was then re-inserted and 2 inflations at 10 and up to 16 atmospheres were made to allow for scoring of the diffuse in-stent plaque.  With each balloon inflation, the patient did experience increasing shortness of breath with return of his cough.  At this point, a 3.0x28 mm Xience Alpine DES stent was then inserted with juxtaposition distally to the previously placed proximal stent in the circumflex vessel. The Xience stent then extended proximally to cover the entire diffuse LM stenosis. This was dilated x2 at 15 and 17 atmospheres for 12 seconds each.  A [place] Euphora3.25x20 mm balloon was used for post stent dilatation initially in the previously placed proximal circumflex stent with dilatations at 9, 12, and 15 atmospheres and then this balloon was then advanced proximally with several additional inflations in the left main with maximal inflation in the left main up to 18 atmospheres.  [facility]out angiography confirmed the excellent result.  During the procedure the patient also received 25 mcg of fentanyl and additional 2 mg of morphine.  At the end of the procedure he was chest pain-free.  He had 1800 cc of urine output in his Foley catheter.  Catheterization laboratory with stable hemodynamics, and we'll be  admitted to the coronary care unit with plans to continue bivalirudin administration for an additional 4 hours.   HEMODYNAMICS:   Central Aorta: 170/102    Left Ventricle: 170/24/39  ANGIOGRAPHY:  Left main: This was a long vessel with near ostial calcification.  There was then diffuse 60-70% stenosis commencing in the proximal third and in the most distal aspect.  The vessel was focally narrowed to 99% prior to giving rise to the proximal LAD and a large left  circumflex vessel per   LAD: The LAD was totally occluded proximally after the takeoff of the first septal perforating artery.  Left circumflex: The left circumflex arose with a 90 angle from the left main.  There was a stent in the proximal third of the vessel that had diffuse 80+ percent in-stent stenosis.  The circumflex gave rise to a large marginal branch.  There were collaterals supplying the distal RCA, PDA, and  PLA vessel from the circumflex vessel  Right coronary artery: 99% ostial stenosis and then was totally occluded proximally.  There were left to right collaterals supplying a portion of the distal RCA.  LIMA to LAD: Patent and anastomose it to the middle LAD.  The previously placed stent in the LAD beyond the anastomosis was patent.  SVG to circumflex vessel was occluded and this was old.  SVG to RCA was occluded and this was old.   LV pressures were recorded, but do to the patient's markedly elevated left ventricular end-diastolic pressure, left ventriculography was not performed.    IMPRESSION:  Severe native coronary obstructive disease with calcification of the proximal  left main followed by diffuse 60-70% body stenosis with a focal 99% distal left main stenosis; total occlusion of the proximal LAD after a proximal septal perforating artery; diffuse, in-stent restenosis of 80% in the  proximal circumflex; total proximal RCA occlusion with faint collaterals arising from the circumflex vessel, supplying the distal RCA.  Patent LIMA to the mid LAD with patent stent in the LAD beyond the anastomosis.  Occlusion of both vein grafts which had supplied the circumflex and right coronary arteries.  Markedly elevated left ventricular end-diastolic pressure at 39 mm with minimal transvalvular left ventricular to aortic gradient of only 3 mm.  Successful high risk percutaneous coronary intervention involving the left main coronary artery and left circumflex coronary artery with  PTCA and stenting of the left main extending into the proximal circumflex, and  Angiosculpt balloon/ PTCA of the proximal circumflex in-stent restenosis with the 80% in-stent restenosis reduced to less than 20% and a diffuse LM stenosis of 60-70% with 99% distal narrowing reduced to 0% with insertion of a 3.0x28 mm Xience Alpine DES stent post dilated to 3.28 mm.   Troy Sine, MD, [facility] 02/28/2014 4:01 PM

## 2014-03-01 DIAGNOSIS — F17200 Nicotine dependence, unspecified, uncomplicated: Secondary | ICD-10-CM

## 2014-03-01 DIAGNOSIS — I359 Nonrheumatic aortic valve disorder, unspecified: Secondary | ICD-10-CM

## 2014-03-01 DIAGNOSIS — I219 Acute myocardial infarction, unspecified: Secondary | ICD-10-CM

## 2014-03-01 DIAGNOSIS — R791 Abnormal coagulation profile: Secondary | ICD-10-CM | POA: Insufficient documentation

## 2014-03-01 LAB — CBC
HEMATOCRIT: 46.7 % (ref 39.0–52.0)
Hemoglobin: 15.9 g/dL (ref 13.0–17.0)
MCH: 32.9 pg (ref 26.0–34.0)
MCHC: 34 g/dL (ref 30.0–36.0)
MCV: 96.5 fL (ref 78.0–100.0)
Platelets: 227 10*3/uL (ref 150–400)
RBC: 4.84 MIL/uL (ref 4.22–5.81)
RDW: 13.4 % (ref 11.5–15.5)
WBC: 17.4 10*3/uL — ABNORMAL HIGH (ref 4.0–10.5)

## 2014-03-01 LAB — BASIC METABOLIC PANEL
BUN: 17 mg/dL (ref 6–23)
CO2: 25 meq/L (ref 19–32)
CREATININE: 0.82 mg/dL (ref 0.50–1.35)
Calcium: 9.4 mg/dL (ref 8.4–10.5)
Chloride: 101 mEq/L (ref 96–112)
GFR calc Af Amer: >90 mL/min (ref >90)
GFR calc non Af Amer: 88 mL/min — ABNORMAL LOW (ref >90)
Glucose, Bld: 139 mg/dL — ABNORMAL HIGH (ref 70–99)
Potassium: 4.1 mEq/L (ref 3.7–5.3)
Sodium: 140 mEq/L (ref 137–147)

## 2014-03-01 LAB — LIPID PANEL
Cholesterol: 143 mg/dL (ref 0–200)
HDL: 64 mg/dL (ref >39)
LDL Cholesterol: 67 mg/dL (ref 0–99)
TRIGLYCERIDES: 61 mg/dL (ref <150)
Total CHOL/HDL Ratio: 2.2 RATIO
VLDL: 12 mg/dL (ref 0–40)

## 2014-03-01 LAB — TROPONIN I: Troponin I: >20 ng/mL (ref <0.30)

## 2014-03-01 LAB — PROTIME-INR
INR: 1.05 (ref 0.00–1.49)
PROTHROMBIN TIME: 13.5 s (ref 11.6–15.2)

## 2014-03-01 MED ORDER — ALUM & MAG HYDROXIDE-SIMETH 200-200-20 MG/5ML PO SUSP
30.0000 mL | ORAL | Status: DC | PRN
  Administered 2014-03-01: 30 mL via ORAL
  Filled 2014-03-01: qty 30

## 2014-03-01 MED ORDER — FUROSEMIDE 10 MG/ML IJ SOLN
40.0000 mg | Freq: Once | INTRAMUSCULAR | Status: AC
  Administered 2014-03-01: 40 mg via INTRAVENOUS
  Filled 2014-03-01: qty 4

## 2014-03-01 MED ORDER — FUROSEMIDE 10 MG/ML IJ SOLN: 40.0000 mg | Freq: Two times a day (BID) | INTRAMUSCULAR | Status: DC

## 2014-03-01 MED FILL — Sodium Chloride IV Soln 0.9%: INTRAVENOUS | Qty: 50 | Status: AC

## 2014-03-01 NOTE — Progress Notes (Addendum)
CARDIAC REHAB PHASE I   PRE:  Rate/Rhythm: 84 SR  BP:  Sitting: 129/69      SaO2: 98 RA  MODE:  Ambulation: 350 ft   POST:  Rate/Rhythm: 88 SR  BP:  Sitting: 128/79     SaO2: 100 RA 1330-1410 Patient ambulated independently in hallway. Steady gait noted. Denies CP but had DOE. Rest break offered but patient declined. Post walk, pt back to chair with call bell and phone in reach. Education completed with pt, wife, and daughter. Pt kept stating "it is too late for all that" speaking about smoking cessation, diet changes, and exercise progression. Pt declined phase 2 cardiac rehab. MI book reviewed with family and patient, handouts given, and encouraged pt to call [phone].  [place] Whedbee English PayneRN, BSN 03/01/2014 2:12 PM  1500 Called back to patients room. Pt decided he would like to do outpatient cardiac rehab at [facility]. Order placed with patient permission and letter will be sent to [place].

## 2014-03-01 NOTE — Care Management Note (Signed)
    Page 1 of 1   03/01/2014     8:17:10 AM CARE MANAGEMENT NOTE 03/01/2014  Patient:  Steve Durham, Steve Durham   Account Number:  [id number]  Date Initiated:  03/01/2014  Documentation initiated by:  Elissa Hefty  Subjective/Objective Assessment:   adm w mi     Action/Plan:   lives w wife   Anticipated DC Date:     Anticipated DC Plan:           Choice offered to / List presented to:             Status of service:   Medicare Important Message given?   (If response is "NO", the following Medicare IM given date fields will be blank) Date Medicare IM given:   Date Additional Medicare IM given:    Discharge Disposition:    Per UR Regulation:  Reviewed for med. necessity/level of care/duration of stay  If discussed at [place] of Stay Meetings, dates discussed:    Comments:

## 2014-03-01 NOTE — Progress Notes (Signed)
Echocardiogram 2D Echocardiogram has been performed.  Ines Bloomer 03/01/2014, 12:08 PM

## 2014-03-01 NOTE — Progress Notes (Signed)
Patient awakened from sleep for lab draw.  Complained of increased chest discomfort over left breast radiating through to back @ 0235; BP=126/73.   Discomfort rated 2/10.  SL NTG given with relief obtained. Will continue to monitor.

## 2014-03-01 NOTE — Progress Notes (Addendum)
Subjective:   Mr. Pangborn is a 68 year old smoking male with hx of severe 3 vessel CAD s/p 3V CABG 1993 with 1/3 patent bypass grafts, mild AS per cath with heavy calcification, and last admission for unstable angina 07/2013 with DES to mid LAD admitted 02/28/14 for borderline inferior STEMI (vs NSTEMI) and taken emergently to cath lab.   Underwent percutaneous coronary intervention involving the left main coronary artery and left circumflex coronary artery with PTCA and stenting of the left main extending into the proximal circumflex with 0x28 mm Xience Alpine DES stent post dilated to 3.28 mm. LVEDP 39 at cath  Reported chest pain continued overnight but relieved with nitroglycerin. Today CP is substernal and down to 2/10 from 10/10 on admission. He is a daily smoker, 1ppd with no intention to quit at this time claiming "it is too late now".  He follows with Dr. Julianne Handler now but used to see Dr. Lia Foyer.   Intake/Output Summary (Last 24 hours) at 03/01/14 0729 Last data filed at 03/01/14 0700  Gross per 24 hour  Intake 956.62 ml  Output   1590 ml  Net -633.38 ml   Current meds: . aspirin EC  81 mg Oral Daily  . atorvastatin  80 mg Oral q1800  . clopidogrel  75 mg Oral Q breakfast  . ezetimibe-simvastatin  1 tablet Oral QHS  . loratadine  10 mg Oral Daily  . metoprolol tartrate  25 mg Oral BID  . omega-3 acid ethyl esters  1 g Oral BID  . pantoprazole  40 mg Oral Daily   Infusions: . sodium chloride 500 mL (02/28/14 1556)  . nitroGLYCERIN 10 mcg/min (03/01/14 0500)   Objective:  Blood pressure 113/65, pulse 64, temperature 98.2 F (36.8 C), temperature source Oral, resp. rate 15, height 5\' 5"  (1.651 m), weight 148 lb (67.132 kg), SpO2 96.00%. Weight change:   Physical Exam: General:  Well appearing. No resp difficulty, sitting in chair, NAD HEENT: EOMI Neck: supple.  Cor: RRR, +SEM 2-3/6 loudest left sternal border, also heard in apex  Lungs: b/l rales lower bases Abdomen:  soft, nontender,+bs . Extremities: no cyanosis, -edema, moving all 4 extremities Neuro: alert & orientedx3, cranial nerves grossly intact. moves all 4 extremities w/o difficulty, strength and sensation grossly intact.  Lab Results: Basic Metabolic Panel:  Recent Labs Lab 02/28/14 1321 02/28/14 1340 03/01/14 0129  NA 143 140 140  K 3.4* 3.8 4.1  CL 106 99 101  CO2  --  22 25  GLUCOSE 151* 124* 139*  BUN 18 17 17   CREATININE 0.80 0.84 0.82  CALCIUM  --  9.2 9.4   Liver Function Tests:  Recent Labs Lab 02/28/14 1340  AST 226*  ALT 42  ALKPHOS 86  BILITOT 1.2  PROT 7.6  ALBUMIN 4.1   CBC:  Recent Labs Lab 02/28/14 1321 02/28/14 1340 03/01/14 0129  WBC  --  17.8* 17.4*  NEUTROABS  --  15.3*  --   HGB 18.0* 17.1* 15.9  HCT 53.0* 50.3 46.7  MCV  --  96.4 96.5  PLT  --  228 227   Cardiac Enzymes:  Recent Labs Lab 02/28/14 1339 02/28/14 2222 03/01/14 0129  CKTOTAL  --  2647*  --   CKMB  --  203.9*  --   TROPONINI >20.00* >20.00* >20.00*   Microbiology: Lab Results  Component Value Date   CULT  Value: NO GROWTH 5 DAYS Performed at [facility] 07/31/2013   CULT  Value:  NO GROWTH 5 DAYS Performed at [facility] 07/31/2013   ASSESSMENT and PLAN:  STEMI s/p cath/PCI 02/28/14--left main and left circumflex coronary artery with PTCA and stenting of the left main to proximal circumflex, and Angiosculpt balloon/ PTCA of the proximal circumflex in-stent restenosis with the 80% in-stent restenosis and a diffuse LM stenosis of 60-70% with 99% distal narrowing reduced to 0% with insertion of Alpine DES stent. Still having chest pain today but down to 2/10.  -continue on ASA, Plavix, Lipitor, BB (Lopressor 25mg  bid) -wean off nitro gtt -cardiac rehab -nitro prn -would benefit from lasix -d/c IVF -restart IMDUR  HTN Hyperlipidemia AS -previously mild. Echo pending to re-[place] Tobacco abuse--no plans to quit at this time. Discussed importance of  cessation.  Elevated INR--4.69 on admission. Unknown etiology, could be lab error. Patient does have hx of alcohol use, estimated ~6pack a week per patient. Of note, AST on admission up to 226.  -will repeat INR now and in AM -AM CMET -?cirrhosis, if remains elevated, may consider imaging for further investigation  Case discussed and patient seen with Dr. Haroldine Laws   LOS: 1 day   Signed: Jerene Pitch, MD PGY-2, Internal Medicine Resident Pager: [phone]  03/01/2014,8:35 AM  Patient seen and examined with Dr. Eula Fried. We discussed all aspects of the encounter. I agree with the assessment and plan as stated above.   Doing well s/p PCI. CP improved. Can go to tele. Continue on ASA, Plavix, Lipitor, BB (Lopressor 25mg  bid). Does seem to be volume overloaded. Will give one dose IV lasix. Can go to tele. Consult CR. Possibly home tomorrow.  Not interested in quitting smoking.  Echo pending to evaluate EF and AS. Plavix lifelong. Given recurrent events may be worth checking P2Y12 in am.   Shaune Pascal Bensimhon,MD 1:45 PM

## 2014-03-02 ENCOUNTER — Encounter (HOSPITAL_COMMUNITY): Payer: Self-pay | Admitting: Physician Assistant

## 2014-03-02 DIAGNOSIS — I2510 Atherosclerotic heart disease of native coronary artery without angina pectoris: Secondary | ICD-10-CM

## 2014-03-02 DIAGNOSIS — I255 Ischemic cardiomyopathy: Secondary | ICD-10-CM

## 2014-03-02 LAB — COMPREHENSIVE METABOLIC PANEL
ALK PHOS: 66 U/L (ref 39–117)
ALT: 36 U/L (ref 0–53)
AST: 110 U/L — ABNORMAL HIGH (ref 0–37)
Albumin: 3.4 g/dL — ABNORMAL LOW (ref 3.5–5.2)
BILIRUBIN TOTAL: 2.2 mg/dL — AB (ref 0.3–1.2)
BUN: 24 mg/dL — AB (ref 6–23)
CO2: 28 meq/L (ref 19–32)
Calcium: 9.3 mg/dL (ref 8.4–10.5)
Chloride: 100 mEq/L (ref 96–112)
Creatinine, Ser: 0.98 mg/dL (ref 0.50–1.35)
GFR, EST NON AFRICAN AMERICAN: 82 mL/min — AB (ref >90)
GLUCOSE: 95 mg/dL (ref 70–99)
POTASSIUM: 4 meq/L (ref 3.7–5.3)
Sodium: 141 mEq/L (ref 137–147)
TOTAL PROTEIN: 6.8 g/dL (ref 6.0–8.3)

## 2014-03-02 LAB — CBC
HCT: 48.7 % (ref 39.0–52.0)
HEMOGLOBIN: 16 g/dL (ref 13.0–17.0)
MCH: 32.1 pg (ref 26.0–34.0)
MCHC: 32.9 g/dL (ref 30.0–36.0)
MCV: 97.8 fL (ref 78.0–100.0)
Platelets: 195 10*3/uL (ref 150–400)
RBC: 4.98 MIL/uL (ref 4.22–5.81)
RDW: 13.5 % (ref 11.5–15.5)
WBC: 12.6 10*3/uL — ABNORMAL HIGH (ref 4.0–10.5)

## 2014-03-02 LAB — PROTIME-INR
INR: 1.22 (ref 0.00–1.49)
Prothrombin Time: 15.1 seconds (ref 11.6–15.2)

## 2014-03-02 LAB — PLATELET INHIBITION P2Y12: PLATELET FUNCTION P2Y12: 129 [PRU] — AB (ref 194–418)

## 2014-03-02 MED ORDER — FUROSEMIDE 40 MG PO TABS: 40.0000 mg | ORAL_TABLET | Freq: Every day | ORAL | Status: DC

## 2014-03-02 MED ORDER — FUROSEMIDE 40 MG PO TABS
40.0000 mg | ORAL_TABLET | Freq: Every day | ORAL | Status: DC
  Administered 2014-03-02: 40 mg via ORAL
  Filled 2014-03-02: qty 1

## 2014-03-02 MED ORDER — POTASSIUM CHLORIDE ER 10 MEQ PO TBCR: 10.0000 meq | EXTENDED_RELEASE_TABLET | Freq: Every day | ORAL | Status: DC

## 2014-03-02 MED ORDER — METOPROLOL TARTRATE 50 MG PO TABS: 25.0000 mg | ORAL_TABLET | Freq: Two times a day (BID) | ORAL | Status: DC

## 2014-03-02 NOTE — Progress Notes (Signed)
DAILY PROGRESS NOTE  Subjective:  No events overnight. Echo shows newly reduced EF of 40-45% with anterior WMA's. Mild aortic stenosis is stable- however, there is now moderate aortic insufficiency. P2Y12 is 129, indicating adequate platelet inhibition.  Objective:  Temp:  [98.1 F (36.7 C)-98.9 F (37.2 C)] 98.2 F (36.8 C) (05/16 0450) Pulse Rate:  [67-83] 67 (05/16 0450) Resp:  [18-27] 18 (05/16 0450) BP: (117-150)/(69-76) 133/76 mmHg (05/16 0450) SpO2:  [94 %-98 %] 98 % (05/16 0450) Weight:  [143 lb 8 oz (65.091 kg)-146 lb (66.225 kg)] 143 lb 8 oz (65.091 kg) (05/16 0450) Weight change: -2 lb (-0.907 kg)  Intake/Output from previous day: 05/15 0701 - 05/16 0700 In: 855.3 [P.O.:815; I.V.:40.3] Out: 650 [Urine:650]  Intake/Output from this shift:    Medications: Current Facility-Administered Medications  Medication Dose Route Frequency Provider Last Rate Last Dose  . acetaminophen (TYLENOL) tablet 650 mg  650 mg Oral Q4H PRN Erlene Quan, PA-C      . ALPRAZolam Duanne Moron) tablet 0.25 mg  0.25 mg Oral BID PRN Erlene Quan, PA-C   0.25 mg at 02/28/14 2146  . alum & mag hydroxide-simeth (MAALOX/MYLANTA) 200-200-20 MG/5ML suspension 30 mL  30 mL Oral PRN Burnell Blanks, MD   30 mL at 03/01/14 0720  . aspirin EC tablet 81 mg  81 mg Oral Daily Troy Sine, MD   81 mg at 03/01/14 1320  . atorvastatin (LIPITOR) tablet 80 mg  80 mg Oral q1800 Troy Sine, MD   80 mg at 03/01/14 1719  . clopidogrel (PLAVIX) tablet 75 mg  75 mg Oral Q breakfast Troy Sine, MD   75 mg at 03/01/14 0815  . ezetimibe-simvastatin (VYTORIN) 10-40 MG per tablet 1 tablet  1 tablet Oral QHS Erlene Quan, PA-C   1 tablet at 03/01/14 2134  . loratadine (CLARITIN) tablet 10 mg  10 mg Oral Daily Erlene Quan, PA-C   10 mg at 03/01/14 1017  . metoprolol tartrate (LOPRESSOR) tablet 25 mg  25 mg Oral BID Troy Sine, MD   25 mg at 03/01/14 2134  . nitroGLYCERIN (NITROSTAT) SL tablet 0.4 mg   0.4 mg Sublingual Q5 Min x 3 PRN Erlene Quan, PA-C   0.4 mg at 03/01/14 0131  . nitroGLYCERIN 0.2 mg/mL in dextrose 5 % infusion  2-200 mcg/min Intravenous Continuous Troy Sine, MD   10 mcg/min at 03/01/14 0800  . omega-3 acid ethyl esters (LOVAZA) capsule 1 g  1 g Oral BID Jaquita Folds, RPH   1 g at 03/01/14 2134  . ondansetron (ZOFRAN) injection 4 mg  4 mg Intravenous Q6H PRN Erlene Quan, PA-C   4 mg at 03/01/14 1610  . pantoprazole (PROTONIX) EC tablet 40 mg  40 mg Oral Daily Erlene Quan, PA-C   40 mg at 03/01/14 1529  . zolpidem (AMBIEN) tablet 5 mg  5 mg Oral QHS PRN Erlene Quan, PA-C   5 mg at 03/01/14 0132    Physical Exam: General appearance: alert and no distress Lungs: clear to auscultation bilaterally Heart: regular rate and rhythm Extremities: extremities normal, atraumatic, no cyanosis or edema  Lab Results: Results for orders placed during the hospital encounter of 02/28/14 (from the past 48 hour(s))  POCT I-STAT, CHEM 8     Status: Abnormal   Collection Time    02/28/14  1:21 PM      Result Value Ref Range   Sodium  143  137 - 147 mEq/L   Potassium 3.4 (*) 3.7 - 5.3 mEq/L   Chloride 106  96 - 112 mEq/L   BUN 18  6 - 23 mg/dL   Creatinine, Ser 0.80  0.50 - 1.35 mg/dL   Glucose, Bld 151 (*) 70 - 99 mg/dL   Calcium, Ion 1.10 (*) 1.13 - 1.30 mmol/L   TCO2 17  0 - 100 mmol/L   Hemoglobin 18.0 (*) 13.0 - 17.0 g/dL   HCT 53.0 (*) 39.0 - 52.0 %  POCT I-STAT 3, ART BLOOD GAS (G3+)     Status: Abnormal   Collection Time    02/28/14  1:21 PM      Result Value Ref Range   pH, Arterial 7.360  7.350 - 7.450   pCO2 arterial 31.8 (*) 35.0 - 45.0 mmHg   pO2, Arterial 59.0 (*) 80.0 - 100.0 mmHg   Bicarbonate 17.9 (*) 20.0 - 24.0 mEq/L   TCO2 19  0 - 100 mmol/L   O2 Saturation 90.0     Acid-base deficit 6.0 (*) 0.0 - 2.0 mmol/L   Sample type ARTERIAL    TROPONIN I     Status: Abnormal   Collection Time    02/28/14  1:39 PM      Result Value Ref Range   Troponin  I >20.00 (*) <0.30 ng/mL   Comment:            Due to the release kinetics of cTnI,     a negative result within the first hours     of the onset of symptoms does not rule out     myocardial infarction with certainty.     If myocardial infarction is still suspected,     repeat the test at appropriate intervals.     CRITICAL RESULT CALLED TO, READ BACK BY AND VERIFIED WITH:     [facility] 1846 02/28/14 WBOND  PROTIME-INR     Status: Abnormal   Collection Time    02/28/14  1:40 PM      Result Value Ref Range   Prothrombin Time 42.3 (*) 11.6 - 15.2 seconds   INR 4.69 (*) 0.00 - 1.49  CBC WITH DIFFERENTIAL     Status: Abnormal   Collection Time    02/28/14  1:40 PM      Result Value Ref Range   WBC 17.8 (*) 4.0 - 10.5 K/uL   RBC 5.22  4.22 - 5.81 MIL/uL   Hemoglobin 17.1 (*) 13.0 - 17.0 g/dL   HCT 50.3  39.0 - 52.0 %   MCV 96.4  78.0 - 100.0 fL   MCH 32.8  26.0 - 34.0 pg   MCHC 34.0  30.0 - 36.0 g/dL   RDW 13.2  11.5 - 15.5 %   Platelets 228  150 - 400 K/uL   Neutrophils Relative % 86 (*) 43 - 77 %   Neutro Abs 15.3 (*) 1.7 - 7.7 K/uL   Lymphocytes Relative 4 (*) 12 - 46 %   Lymphs Abs 0.8  0.7 - 4.0 K/uL   Monocytes Relative 10  3 - 12 %   Monocytes Absolute 1.7 (*) 0.1 - 1.0 K/uL   Eosinophils Relative 0  0 - 5 %   Eosinophils Absolute 0.0  0.0 - 0.7 K/uL   Basophils Relative 0  0 - 1 %   Basophils Absolute 0.0  0.0 - 0.1 K/uL  COMPREHENSIVE METABOLIC PANEL     Status: Abnormal   Collection  Time    02/28/14  1:40 PM      Result Value Ref Range   Sodium 140  137 - 147 mEq/L   Potassium 3.8  3.7 - 5.3 mEq/L   Chloride 99  96 - 112 mEq/L   CO2 22  19 - 32 mEq/L   Glucose, Bld 124 (*) 70 - 99 mg/dL   BUN 17  6 - 23 mg/dL   Creatinine, Ser 0.84  0.50 - 1.35 mg/dL   Calcium 9.2  8.4 - 10.5 mg/dL   Total Protein 7.6  6.0 - 8.3 g/dL   Albumin 4.1  3.5 - 5.2 g/dL   AST 226 (*) 0 - 37 U/L   ALT 42  0 - 53 U/L   Alkaline Phosphatase 86  39 - 117 U/L   Total Bilirubin 1.2   0.3 - 1.2 mg/dL   GFR calc non Af Amer 87 (*) >90 mL/min   GFR calc Af Amer >90  >90 mL/min   Comment: (NOTE)     The eGFR has been calculated using the CKD EPI equation.     This calculation has not been validated in all clinical situations.     eGFR's persistently <90 mL/min signify possible Chronic Kidney     Disease.  POCT ACTIVATED CLOTTING TIME     Status: None   Collection Time    02/28/14  1:58 PM      Result Value Ref Range   Activated Clotting Time 382    MRSA PCR SCREENING     Status: None   Collection Time    02/28/14  4:43 PM      Result Value Ref Range   MRSA by PCR NEGATIVE  NEGATIVE   Comment:            The GeneXpert MRSA Assay (FDA     approved for NASAL specimens     only), is one component of a     comprehensive MRSA colonization     surveillance program. It is not     intended to diagnose MRSA     infection nor to guide or     monitor treatment for     MRSA infections.  POCT ACTIVATED CLOTTING TIME     Status: None   Collection Time    02/28/14  9:35 PM      Result Value Ref Range   Activated Clotting Time 143    TROPONIN I     Status: Abnormal   Collection Time    02/28/14 10:22 PM      Result Value Ref Range   Troponin I >20.00 (*) <0.30 ng/mL   Comment:            Due to the release kinetics of cTnI,     a negative result within the first hours     of the onset of symptoms does not rule out     myocardial infarction with certainty.     If myocardial infarction is still suspected,     repeat the test at appropriate intervals.     CRITICAL VALUE NOTED.  VALUE IS CONSISTENT WITH PREVIOUSLY REPORTED AND CALLED VALUE.  CK TOTAL AND CKMB     Status: Abnormal   Collection Time    02/28/14 10:22 PM      Result Value Ref Range   Total CK 2647 (*) 7 - 232 U/L   CK, MB 203.9 (*) 0.3 - 4.0 ng/mL   Comment: CRITICAL RESULT CALLED  TO, READ BACK BY AND VERIFIED WITH:     TURNER G,RN 02/28/14 2307 WAYK   Relative Index 7.7 (*) 0.0 - 2.5  TROPONIN I      Status: Abnormal   Collection Time    03/01/14  1:29 AM      Result Value Ref Range   Troponin I >20.00 (*) <0.30 ng/mL   Comment:            Due to the release kinetics of cTnI,     a negative result within the first hours     of the onset of symptoms does not rule out     myocardial infarction with certainty.     If myocardial infarction is still suspected,     repeat the test at appropriate intervals.     CRITICAL VALUE NOTED.  VALUE IS CONSISTENT WITH PREVIOUSLY REPORTED AND CALLED VALUE.  CBC     Status: Abnormal   Collection Time    03/01/14  1:29 AM      Result Value Ref Range   WBC 17.4 (*) 4.0 - 10.5 K/uL   RBC 4.84  4.22 - 5.81 MIL/uL   Hemoglobin 15.9  13.0 - 17.0 g/dL   HCT 46.7  39.0 - 52.0 %   MCV 96.5  78.0 - 100.0 fL   MCH 32.9  26.0 - 34.0 pg   MCHC 34.0  30.0 - 36.0 g/dL   RDW 13.4  11.5 - 15.5 %   Platelets 227  150 - 400 K/uL  BASIC METABOLIC PANEL     Status: Abnormal   Collection Time    03/01/14  1:29 AM      Result Value Ref Range   Sodium 140  137 - 147 mEq/L   Potassium 4.1  3.7 - 5.3 mEq/L   Chloride 101  96 - 112 mEq/L   CO2 25  19 - 32 mEq/L   Glucose, Bld 139 (*) 70 - 99 mg/dL   BUN 17  6 - 23 mg/dL   Creatinine, Ser 0.82  0.50 - 1.35 mg/dL   Calcium 9.4  8.4 - 10.5 mg/dL   GFR calc non Af Amer 88 (*) >90 mL/min   GFR calc Af Amer >90  >90 mL/min   Comment: (NOTE)     The eGFR has been calculated using the CKD EPI equation.     This calculation has not been validated in all clinical situations.     eGFR's persistently <90 mL/min signify possible Chronic Kidney     Disease.  LIPID PANEL     Status: None   Collection Time    03/01/14  1:29 AM      Result Value Ref Range   Cholesterol 143  0 - 200 mg/dL   Triglycerides 61  <150 mg/dL   HDL 64  >39 mg/dL   Total CHOL/HDL Ratio 2.2     VLDL 12  0 - 40 mg/dL   LDL Cholesterol 67  0 - 99 mg/dL   Comment:            Total Cholesterol/HDL:CHD Risk     Coronary Heart Disease Risk Table                          Men   Women      1/2 Average Risk   3.4   3.3      Average Risk       5.0  4.4      2 X Average Risk   9.6   7.1      3 X Average Risk  23.4   11.0                Use the calculated Patient Ratio     above and the CHD Risk Table     to determine the patient's CHD Risk.                ATP III CLASSIFICATION (LDL):      <100     mg/dL   Optimal      100-129  mg/dL   Near or Above                        Optimal      130-159  mg/dL   Borderline      160-189  mg/dL   High      >190     mg/dL   Very High  PROTIME-INR     Status: None   Collection Time    03/01/14  7:30 PM      Result Value Ref Range   Prothrombin Time 13.5  11.6 - 15.2 seconds   INR 1.05  0.00 - 1.49  CBC     Status: Abnormal   Collection Time    03/02/14  4:45 AM      Result Value Ref Range   WBC 12.6 (*) 4.0 - 10.5 K/uL   RBC 4.98  4.22 - 5.81 MIL/uL   Hemoglobin 16.0  13.0 - 17.0 g/dL   HCT 48.7  39.0 - 52.0 %   MCV 97.8  78.0 - 100.0 fL   MCH 32.1  26.0 - 34.0 pg   MCHC 32.9  30.0 - 36.0 g/dL   RDW 13.5  11.5 - 15.5 %   Platelets 195  150 - 400 K/uL  PLATELET INHIBITION P2Y12     Status: Abnormal   Collection Time    03/02/14  4:45 AM      Result Value Ref Range   Platelet Function  P2Y12 129 (*) 194 - 418 PRU   Comment:            The literature has shown a direct     correlation of PRU values over     230 with higher risks of     thrombotic events.  Lower PRU     values are associated with     platelet inhibition.  PROTIME-INR     Status: None   Collection Time    03/02/14  4:45 AM      Result Value Ref Range   Prothrombin Time 15.1  11.6 - 15.2 seconds   INR 1.22  0.00 - 1.49  COMPREHENSIVE METABOLIC PANEL     Status: Abnormal   Collection Time    03/02/14  4:45 AM      Result Value Ref Range   Sodium 141  137 - 147 mEq/L   Potassium 4.0  3.7 - 5.3 mEq/L   Chloride 100  96 - 112 mEq/L   CO2 28  19 - 32 mEq/L   Glucose, Bld 95  70 - 99 mg/dL   BUN 24 (*) 6 - 23 mg/dL    Creatinine, Ser 0.98  0.50 - 1.35 mg/dL   Calcium 9.3  8.4 - 10.5 mg/dL   Total Protein 6.8  6.0 - 8.3 g/dL   Albumin 3.4 (*)  3.5 - 5.2 g/dL   AST 110 (*) 0 - 37 U/L   ALT 36  0 - 53 U/L   Alkaline Phosphatase 66  39 - 117 U/L   Total Bilirubin 2.2 (*) 0.3 - 1.2 mg/dL   GFR calc non Af Amer 82 (*) >90 mL/min   GFR calc Af Amer >90  >90 mL/min   Comment: (NOTE)     The eGFR has been calculated using the CKD EPI equation.     This calculation has not been validated in all clinical situations.     eGFR's persistently <90 mL/min signify possible Chronic Kidney     Disease.    Imaging: No results found.  Assessment:  1. Principal Problem: 2.   STEMI- post/lat 02/28/14 3. Active Problems: 4.   HYPERTENSION 5.   CABG '98, cath Oct 2014- LAD DES placed. 6.   Aortic stenosis- mild Oct 2014 7.   Hyperlipidemia 8.   Tobacco abuse 9.   Elevated INR 10.   Plan:  1. Chest pain has totally resolved. He will need to restart on lasix. P2Y12 shows adequate platelet inhibition. He continues to want to smoke, despite multiple discussions about smoking cessation. Will need follow-up in 7-10 days with APP or Dr. Julianne Handler - apparently he has an appointment with him next month. Also, he would like a hard copy of his lasix Rx.  Time Spent Directly with Patient:  15 minutes  Length of Stay:  LOS: 2 days   Pixie Casino, MD, [facility] Attending Cardiologist [place] 03/02/2014, 8:17 AM

## 2014-03-02 NOTE — Progress Notes (Signed)
Pt requesting a HARD copy ONLY of any prescriptions for him at discharge.  Thank you.

## 2014-03-02 NOTE — Discharge Summary (Signed)
Discharge Summary   Patient ID: Steve Durham, MRN: [id number], DOB/AGE: 1946-07-24 68 y.o.  Admit date: 02/28/2014 Discharge date: 03/02/2014   Primary Care Physician:  No primary provider on file.   Primary Cardiologist:  Dr. Lauree Chandler    Reason for Admission:  Inferior STEMI   Primary Discharge Diagnoses:  Principal Problem:   Post/Lat STEMI - s/p DES to LM and PTCA of CFX in-stent restenosis this admission Active Problems:   HYPERTENSION   CABG '98, cath Oct 2014- LAD DES placed.   Aortic stenosis- mild Oct 2014   Hyperlipidemia   Aortic insufficiency   Tobacco abuse   Ischemic cardiomyopathy with EF 40-45%     Wt Readings from Last 3 Encounters:  03/02/14 143 lb 8 oz (65.091 kg)  03/02/14 143 lb 8 oz (65.091 kg)  10/03/13 148 lb (67.132 kg)    Secondary Discharge Diagnoses:   Past Medical History  Diagnosis Date  . Hypertension   . Hyperlipoproteinemia   . Aortic stenosis     a. Mod AS/AI by cath 07/2012.;  b.  Echo (07/31/13) is: EF 55-60%, mild aortic stenosis (mean gradient 13);  c. Echo (5/15):  EF 40-45%, mild AS (mean 12 mmHg), mod AI  . Back pain   . Carotid artery occlusion     a. Dopplers 07/2012: no significant high grade obstruction.  . Hyperlipidemia   . Abnormal CT scan, kidney     07/2012 - multiple small cysts  . Cholelithiasis     Seen on CT 07/2012  . Coronary artery disease     a. CABG 10/1991. b. cath 07/2012 with prog dsz, turned down for re-do CABG - for med rx for now.; c. NSTEMI => PCI:DES to mLAD ext into IMA graft;  d. Inf STEMI (5/15):  LM 60-70% then 99% before LAD, pLAD occl, pCFX stent 80+% ISR, oRCA 99% then occl (L-R collats to dRCA), L-LAD ok with patent stent, S-CFX occl (old), S-RCA occl (old); PCI: LM ext into CFX with Xience Alpine DES  . Ischemic cardiomyopathy     a. EF 40-45%, inf and IL hypokinesis, Gr 2 DD, mild AS (mean 12 mmHg), mod AI, mild MR, mod LAE, mildly reduced RVSF, mild TR      Allergies:    No Known Allergies    Procedures Performed This Admission:    Cardiac Catheterization and Percutaneous Coronary Intervention 02/28/14: ANGIOGRAPHY:  Left main: This was a long vessel with near ostial calcification. There was then diffuse 60-70% stenosis commencing in the proximal third and in the most distal aspect. The vessel was focally narrowed to 99% prior to giving rise to the proximal LAD and a large left circumflex vessel per  LAD: The LAD was totally occluded proximally after the takeoff of the first septal perforating artery.  Left circumflex: The left circumflex arose with a 90 angle from the left main. There was a stent in the proximal third of the vessel that had diffuse 80+ percent in-stent stenosis. The circumflex gave rise to a large marginal branch. There were collaterals supplying the distal RCA, PDA, and PLA vessel from the circumflex vessel  Right coronary artery: 99% ostial stenosis and then was totally occluded proximally. There were left to right collaterals supplying a portion of the distal RCA.  LIMA to LAD: Patent and anastomose it to the middle LAD. The previously placed stent in the LAD beyond the anastomosis was patent.  SVG to circumflex vessel was occluded and this was old.  SVG  to RCA was occluded and this was old.  LV pressures were recorded, but do to the patient's markedly elevated left ventricular end-diastolic pressure, left ventriculography was not performed.  IMPRESSION:  Severe native coronary obstructive disease with calcification of the proximal left main followed by diffuse 60-70% body stenosis with a focal 99% distal left main stenosis; total occlusion of the proximal LAD after a proximal septal perforating artery; diffuse, in-stent restenosis of 80% in the proximal circumflex; total proximal RCA occlusion with faint collaterals arising from the circumflex vessel, supplying the distal RCA.  Patent LIMA to the mid LAD with patent stent in the LAD beyond the  anastomosis.  Occlusion of both vein grafts which had supplied the circumflex and right coronary arteries.  Markedly elevated left ventricular end-diastolic pressure at 39 mm with minimal transvalvular left ventricular to aortic gradient of only 3 mm.  Successful high risk percutaneous coronary intervention involving the left main coronary artery and left circumflex coronary artery with PTCA and stenting of the left main extending into the proximal circumflex, and Angiosculpt balloon/ PTCA of the proximal circumflex in-stent restenosis with the 80% in-stent restenosis reduced to less than 20% and a diffuse LM stenosis of 60-70% with 99% distal narrowing reduced to 0% with insertion of a 3.0x28 mm Xience Alpine DES stent post dilated to 3.28 mm.   Hospital Course:  Steve Durham is a 68 y.o. male with a hx of CAD s/p CABG in 1993, HTN, HL, mild aortic stenosis, tobacco abuse.  Cardiac cath in 07/2012 demonstrated occlusion of LAD, patent IMA to LAD, occluded proximal RCA with occluded SVG to PDA, severe disease in left main into Circumflex with occluded vein graft to Circumflex. Re-do CABG suggested but his aorta is severely calcified. He was seen by Dr. Servando Snare with CT surgery and not felt to be a candidate for re-do bypass.  He was treated medically at that time.  He was admitted in 07/2013 with a NSTEMI and underwent DES to mid LAD extending back into the IMA graft.  Last seen by Dr. Lauree Chandler in 09/2013.    He presented to the hospital via EMS on the day of admission with chest pain on and off for several days and ECG with STE in 3 and aVF (ST depression in V2-6).  He was taken emergently to the the cath lab for inferior STEMI.  He was felt to be in acute pulmonary edema and given IV Lasix x 1.   LHC demonstrated patent L-LAD, known occluded S-RCA and S-CFX, critical LM stenosis and high grade CFX in-stent restenosis.  He underwent PCI of the LM extending into the prox CFX with Angiosculpt  balloon/PTCA of prox CFX ISR and Xience Alpine DES (3 x 28 mm) to LM.  He had no immediate complications.  He had residual chest pain post PCI that was improved overall and eventually resolved.  P2Y12 demonstrated adequate PLT inhibition.  Echo demonstrated worsening LVF with EF 40-45%, inf and IL hypokinesis, Gr 2 DD, mild AS (mean 12 mmHg), mod AI, mild MR, mod LAE, mildly reduced RVSF, mild TR.  He was seen by Dr. Lyman Bishop this AM.  He was chest pain free and doing well.  He is felt stable for d/c to home.  He will be sent home on his usual medications and start on Lasix 40 mg QD with K+ 10 mEq QD.  Will need a BMET in 1 week. Smoking cessation has been recommended.  He will need f/u in  7-10 days.     Discharge Vitals:   Blood pressure 119/69, pulse 75, temperature 98.2 F (36.8 C), temperature source Oral, resp. rate 18, height 5\' 6"  (1.676 m), weight 143 lb 8 oz (65.091 kg), SpO2 98.00%.   Labs:   Recent Labs  02/28/14 1340 03/01/14 0129 03/02/14 0445  WBC 17.8* 17.4* 12.6*  HGB 17.1* 15.9 16.0  HCT 50.3 46.7 48.7  MCV 96.4 96.5 97.8  PLT 228 227 195     Recent Labs  02/28/14 1340 03/01/14 0129 03/02/14 0445  NA 140 140 141  K 3.8 4.1 4.0  CL 99 101 100  CO2 22 25 28   BUN 17 17 24*  CREATININE 0.84 0.82 0.98  CALCIUM 9.2 9.4 9.3  PROT 7.6  --  6.8  BILITOT 1.2  --  2.2*  ALKPHOS 86  --  66  ALT 42  --  36  AST 226*  --  110*     Recent Labs  02/28/14 1339 02/28/14 2222 03/01/14 0129  CKTOTAL  --  2647*  --   CKMB  --  203.9*  --   TROPONINI >20.00* >20.00* >20.00*    Lab Results  Component Value Date   CHOL 143 03/01/2014   HDL 64 03/01/2014   LDLCALC 67 03/01/2014   TRIG 61 03/01/2014    No results found for this basename: DDIMER    Lab Results  Component Value Date   TSH 1.410 08/09/2012     Recent Labs  02/28/14 1340 03/01/14 1930 03/02/14 0445  INR 4.69* 1.05 1.22     Diagnostic Procedures and Studies:  No results  found.   2D Echocardiogram 03/01/14:  - Left ventricle: The cavity size was mildly dilated. Systolic function was mildly to moderately reduced. The estimated ejection fraction was in the range of 40% to 45%. Wall motion was normal; there were no regional wall motion abnormalities. Features are consistent with a pseudonormal left ventricular filling pattern, with concomitant abnormal relaxation and increased filling pressure (grade 2 diastolic dysfunction). Doppler parameters are consistent with elevated ventricular end-diastolic filling pressure. - Aortic valve: Valve mobility was restricted. There was mild stenosis. Moderate regurgitation. Valve area: 1.64cm^2(VTI). Valve area: 1.61cm^2 (Vmax). - Mitral valve: Mild regurgitation. - Left atrium: The atrium was moderately dilated. - Right ventricle: Systolic function was mildly reduced. - Tricuspid valve: Mild regurgitation. - Pulmonary arteries: Systolic pressure was within the normal range. Impressions:  - Compared to the prior study from October 2014 the left ventricle is now mildly dilated with mildly decreased left ventricular function (LVEF 40-45%) with hypokinesis in the basal and mid inferior and inferolateral wall. Right ventricular function is mildly decreased. Aortic insufficiency is now moderate (previosuly mild). There is stable mild aortic stenosis.     Disposition:   Pt is being discharged home today in good condition.  Follow-up Plans & Appointments      Follow-up Information   Follow up with [facility], MD In 1 week. (office will call for an appointment with Dr. Angelena Form or Physician Assistant )    Specialty:  Cardiology   Contact information:   [address] [place] [address] [phone]       Follow up with [place] In 1 week. (office will call to arrange lab test (BMET))    Contact information:   [place] [id number] [phone]      Discharge Medications    Medication List  amLODipine-benazepril 5-20 MG per capsule  Commonly known as:  LOTREL  Take 2 capsules by mouth daily.     aspirin EC 81 MG tablet  Take 81 mg by mouth at bedtime.     clopidogrel 75 MG tablet  Commonly known as:  PLAVIX  Take 75 mg by mouth at bedtime.     ezetimibe-simvastatin 10-40 MG per tablet  Commonly known as:  VYTORIN  Take 1 tablet by mouth at bedtime.     fexofenadine 180 MG tablet  Commonly known as:  ALLEGRA  Take 180 mg by mouth daily.     Fish Oil 1000 MG Caps  Take 1,000 mg by mouth 2 (two) times daily.     furosemide 40 MG tablet  Commonly known as:  LASIX  Take 1 tablet (40 mg total) by mouth daily.     isosorbide mononitrate 30 MG 24 hr tablet  Commonly known as:  IMDUR  Take 1 tablet (30 mg total) by mouth daily.     metoprolol 50 MG tablet  Commonly known as:  LOPRESSOR  Take 0.5 tablets (25 mg total) by mouth 2 (two) times daily.     nitroGLYCERIN 0.4 MG SL tablet  Commonly known as:  NITROSTAT  Place 1 tablet (0.4 mg total) under the tongue every 5 (five) minutes as needed for chest pain (up to 3 doses).     pantoprazole 40 MG tablet  Commonly known as:  PROTONIX  Take 40 mg by mouth at bedtime.     potassium chloride 10 MEQ tablet  Commonly known as:  K-DUR  Take 1 tablet (10 mEq total) by mouth daily.         Outstanding Labs/Studies  1. BMET in 1 week.  Duration of Discharge Encounter: Greater than 30 minutes including physician and PA time.  Signed, Richardson Dopp, PA-C   03/02/2014 10:19 AM

## 2014-03-04 ENCOUNTER — Telehealth: Payer: Self-pay | Admitting: Nurse Practitioner

## 2014-03-04 DIAGNOSIS — Z79899 Other long term (current) drug therapy: Secondary | ICD-10-CM

## 2014-03-04 NOTE — Telephone Encounter (Signed)
Spoke with patient and he states he has no questions or problems at this time. Patient aware of appointment and will call if he needs anything before then. Patient schedule for labs this week

## 2014-03-04 NOTE — Telephone Encounter (Signed)
New problem   7-10 day TOC on 03/13/14 @ 10:30.

## 2014-03-07 ENCOUNTER — Other Ambulatory Visit: Payer: Medicare Other

## 2014-03-08 ENCOUNTER — Other Ambulatory Visit (INDEPENDENT_AMBULATORY_CARE_PROVIDER_SITE_OTHER): Payer: Medicare Other

## 2014-03-08 ENCOUNTER — Encounter: Payer: Self-pay | Admitting: Cardiovascular Disease

## 2014-03-08 DIAGNOSIS — Z79899 Other long term (current) drug therapy: Secondary | ICD-10-CM | POA: Diagnosis not present

## 2014-03-08 LAB — BASIC METABOLIC PANEL
BUN: 19 mg/dL (ref 6–23)
CO2: 25 mEq/L (ref 19–32)
Calcium: 8.9 mg/dL (ref 8.4–10.5)
Chloride: 103 mEq/L (ref 96–112)
Creatinine, Ser: 1.1 mg/dL (ref 0.4–1.5)
GFR: 73.45 mL/min (ref >60.00)
Glucose, Bld: 137 mg/dL — ABNORMAL HIGH (ref 70–99)
Potassium: 3.8 mEq/L (ref 3.5–5.1)
Sodium: 137 mEq/L (ref 135–145)

## 2014-03-13 ENCOUNTER — Encounter: Payer: Self-pay | Admitting: Nurse Practitioner

## 2014-03-13 ENCOUNTER — Ambulatory Visit
Admission: RE | Admit: 2014-03-13 | Discharge: 2014-03-13 | Disposition: A | Discharge status: Home / Self Care | Payer: Medicare Other | Source: Ambulatory Visit | Attending: Nurse Practitioner | Admitting: Nurse Practitioner

## 2014-03-13 ENCOUNTER — Ambulatory Visit (INDEPENDENT_AMBULATORY_CARE_PROVIDER_SITE_OTHER): Payer: Medicare Other | Admitting: Nurse Practitioner

## 2014-03-13 VITALS — BP 140/90 | HR 58 | Ht 65.0 in | Wt 150.0 lb

## 2014-03-13 DIAGNOSIS — Z955 Presence of coronary angioplasty implant and graft: Secondary | ICD-10-CM

## 2014-03-13 DIAGNOSIS — I2589 Other forms of chronic ischemic heart disease: Secondary | ICD-10-CM

## 2014-03-13 DIAGNOSIS — R0989 Other specified symptoms and signs involving the circulatory and respiratory systems: Secondary | ICD-10-CM

## 2014-03-13 DIAGNOSIS — R0600 Dyspnea, unspecified: Secondary | ICD-10-CM

## 2014-03-13 DIAGNOSIS — I255 Ischemic cardiomyopathy: Secondary | ICD-10-CM

## 2014-03-13 DIAGNOSIS — I10 Essential (primary) hypertension: Secondary | ICD-10-CM

## 2014-03-13 DIAGNOSIS — I2510 Atherosclerotic heart disease of native coronary artery without angina pectoris: Secondary | ICD-10-CM | POA: Diagnosis not present

## 2014-03-13 DIAGNOSIS — R0609 Other forms of dyspnea: Secondary | ICD-10-CM

## 2014-03-13 DIAGNOSIS — Z87891 Personal history of nicotine dependence: Secondary | ICD-10-CM | POA: Diagnosis not present

## 2014-03-13 DIAGNOSIS — Z9861 Coronary angioplasty status: Secondary | ICD-10-CM | POA: Diagnosis not present

## 2014-03-13 LAB — CBC
HCT: 41.1 % (ref 39.0–52.0)
Hemoglobin: 13.6 g/dL (ref 13.0–17.0)
MCHC: 33.2 g/dL (ref 30.0–36.0)
MCV: 95.6 fl (ref 78.0–100.0)
Platelets: 251 10*3/uL (ref 150.0–400.0)
RBC: 4.3 Mil/uL (ref 4.22–5.81)
RDW: 12.9 % (ref 11.5–15.5)
WBC: 14 10*3/uL — ABNORMAL HIGH (ref 4.0–10.5)

## 2014-03-13 LAB — BASIC METABOLIC PANEL
BUN: 15 mg/dL (ref 6–23)
CO2: 28 mEq/L (ref 19–32)
Calcium: 8.8 mg/dL (ref 8.4–10.5)
Chloride: 102 mEq/L (ref 96–112)
Creatinine, Ser: 1.1 mg/dL (ref 0.4–1.5)
GFR: 67.54 mL/min (ref >60.00)
Glucose, Bld: 84 mg/dL (ref 70–99)
Potassium: 3.7 mEq/L (ref 3.5–5.1)
Sodium: 137 mEq/L (ref 135–145)

## 2014-03-13 LAB — BRAIN NATRIURETIC PEPTIDE: Pro B Natriuretic peptide (BNP): 1311 pg/mL — ABNORMAL HIGH (ref 0.0–100.0)

## 2014-03-13 NOTE — Progress Notes (Signed)
Steve Durham Date of Birth: [birth year] Medical Record #[id number]  History of Present Illness: Steve Durham is seen back today for a post hospital/TOC visit. Seen for Dr. Angelena Form. He is a 68 year old male with HTN, CAD with remote CABG in 1998, LAD stent (DES) placed in October of 2014, mild aortic stenosis, HLD, aortic insufficiency, tobacco abuse and ischemic CM with EF of 40 to 45%. He has been turned down for redo CABG by Dr. Servando Snare due to his aorta being too calcified.   Most recently admitted with inferior STEMI - had DES to LM and PTCA of LCX due to in-stent restenosis. EF has dropped now to 40 to 45%. Was normal back in October. Has diastolic dysfunction.   Comes back today. Here with his wife. He remains fatigued, no energy, coughing and feels like he has lung congestion. Clear secretions. No fever or chills. Says he is not smoking "but not sure how long that will last". Still has chest tightness and sleeps in the recliner as he has done for many years - this is a chronic complaint. No actual chest pain like what took him to the hospital. Seems to be at his baseline. Eats all of his meals basically out - gets too much salt.   Current Outpatient Prescriptions  Medication Sig Dispense Refill  . amLODipine-benazepril (LOTREL) 5-20 MG per capsule Take 2 capsules by mouth daily.  60 capsule  11  . aspirin EC 81 MG tablet Take 81 mg by mouth at bedtime.      . clopidogrel (PLAVIX) 75 MG tablet Take 75 mg by mouth at bedtime.      Marland Kitchen ezetimibe-simvastatin (VYTORIN) 10-40 MG per tablet Take 1 tablet by mouth at bedtime.  30 tablet  11  . fexofenadine (ALLEGRA) 180 MG tablet Take 180 mg by mouth daily.       . furosemide (LASIX) 40 MG tablet Take 1 tablet (40 mg total) by mouth daily.  30 tablet  5  . isosorbide mononitrate (IMDUR) 30 MG 24 hr tablet Take 1 tablet (30 mg total) by mouth daily.  30 tablet  11  . metoprolol (LOPRESSOR) 50 MG tablet Take 0.5 tablets (25 mg total) by mouth 2  (two) times daily.      . nitroGLYCERIN (NITROSTAT) 0.4 MG SL tablet Place 1 tablet (0.4 mg total) under the tongue every 5 (five) minutes as needed for chest pain (up to 3 doses).  25 tablet  6  . Omega-3 Fatty Acids (FISH OIL) 1000 MG CAPS Take 1,000 mg by mouth 2 (two) times daily.        . pantoprazole (PROTONIX) 40 MG tablet Take 40 mg by mouth at bedtime.       . potassium chloride (K-DUR) 10 MEQ tablet Take 1 tablet (10 mEq total) by mouth daily.  30 tablet  5   No current facility-administered medications for this visit.    No Known Allergies  Past Medical History  Diagnosis Date  . Hypertension   . Hyperlipoproteinemia   . Aortic stenosis     a. Mod AS/AI by cath 07/2012.;  b.  Echo (07/31/13) is: EF 55-60%, mild aortic stenosis (mean gradient 13);  c. Echo (5/15):  EF 40-45%, mild AS (mean 12 mmHg), mod AI  . Back pain   . Carotid artery occlusion     a. Dopplers 07/2012: no significant high grade obstruction.  . Hyperlipidemia   . Abnormal CT scan, kidney     07/2012 -  multiple small cysts  . Cholelithiasis     Seen on CT 07/2012  . Coronary artery disease     a. CABG 10/1991. b. cath 07/2012 with prog dsz, turned down for re-do CABG - for med rx for now.; c. NSTEMI => PCI:DES to mLAD ext into IMA graft;  d. Inf STEMI (5/15):  LM 60-70% then 99% before LAD, pLAD occl, pCFX stent 80+% ISR, oRCA 99% then occl (L-R collats to dRCA), L-LAD ok with patent stent, S-CFX occl (old), S-RCA occl (old); PCI: LM ext into CFX with Xience Alpine DES  . Ischemic cardiomyopathy     a. EF 40-45%, inf and IL hypokinesis, Gr 2 DD, mild AS (mean 12 mmHg), mod AI, mild MR, mod LAE, mildly reduced RVSF, mild TR    Past Surgical History  Procedure Laterality Date  . Cardiac catheterization  04/03/99  . Carpal tunnel release  2007    Excision mass dorsal left wrist  . Hernia repair  1988  . Coronary artery bypass graft  1993  . Fasciotomy Right 07/30/2013    Procedure: OPEN FASCIOTOMY RIGHT  RING FINGER & RIGHT SMALL FINGER MULTIPLE LEVELS;  Surgeon: Wynonia Sours, MD;  Location: [facility];  Service: Orthopedics;  Laterality: Right;    History  Smoking status  . Current Every Day Smoker -- 1.00 packs/day for 50 years  Smokeless tobacco  . Not on file    Comment: has cut back     History  Alcohol Use  . 8.4 oz/week  . 14 Cans of beer per week    Comment: daily-6 daily    Family History  Problem Relation Age of Onset  . Heart attack Mother     MI  . Hypertension Sister   . Emphysema Brother     Review of Systems: The review of systems is per the HPI.  All other systems were reviewed and are negative.  Physical Exam: BP 140/90  Pulse 58  Ht 5\' 5"  (1.651 m)  Wt 150 lb (68.04 kg)  BMI 24.96 kg/m2  SpO2 99% Patient is alert, a little gruff but in no acute distress. Smells of tobacco. Weight is up 7 pounds. Skin is warm and dry. Color is normal.  HEENT is unremarkable. Normocephalic/atraumatic. PERRL. Sclera are nonicteric. Neck is supple. No masses. No JVD. Lungs are coarse. Cardiac exam shows a regular rate and rhythm. Harsh outflow murmur.  Abdomen is soft. Extremities are without edema. Gait and ROM are intact. No gross neurologic deficits noted.  Wt Readings from Last 3 Encounters:  03/13/14 150 lb (68.04 kg)  03/02/14 143 lb 8 oz (65.091 kg)  03/02/14 143 lb 8 oz (65.091 kg)     LABORATORY DATA: PENDING  Lab Results  Component Value Date   WBC 12.6* 03/02/2014   HGB 16.0 03/02/2014   HCT 48.7 03/02/2014   PLT 195 03/02/2014   GLUCOSE 137* 03/08/2014   CHOL 143 03/01/2014   TRIG 61 03/01/2014   HDL 64 03/01/2014   LDLCALC 67 03/01/2014   ALT 36 03/02/2014   AST 110* 03/02/2014   NA 137 03/08/2014   K 3.8 03/08/2014   CL 103 03/08/2014   CREATININE 1.1 03/08/2014   BUN 19 03/08/2014   CO2 25 03/08/2014   TSH 1.410 08/09/2012   INR 1.22 03/02/2014   HGBA1C 5.7* 07/31/2013   EMERGENT CARDIAC CATHETERIZATION/PERCUTANEOUS CORONARY INTERVENTION    HISTORY:  Steve Durham is a 68 y.o. male with a history of  CAD, status post CABG in 1993, HTN, Hyperlipidemia, and mild AS. He was last admitted as a STEMI 07/30/13. Cath showed subtotal occlusion of the LAD after the graft insertion with a patent IMA to LAD, occluded proximal RCA with occluded SVG to PDA, 60 - 70% left main , 70 % diffuse in stent In the proximal LCX with an occluded vein graft to Circumflex. Re-do CABG was considered but his aorta was severely calcified. He was seen by Dr. Servando Snare and not felt to be a candidate for re-do bypass. He underwent Promus premier (2.25 x 20 mm) DES to the mid LAD extending back into the IMA graft. Echo (07/31/13) revealed EF 55-60%, mild aortic stenosis (mean gradient 13), mild LAE.  He presents this afternoon as a STEMI via EMS. The pt has been having chest pain intermittently for several days. He started having during the night. He has taken a total of 12 NTG before calling EMS. He continues to have chest and is SOB and mottled in the ER. EKG reveals ST elevation 3 and AVF with ST depression V2-V6 and AVL. His B/P was stable on arrival to the ER. Pt was taken urgently to the cath lab.  PROCEDURE: Left heart catheterization: Coronary angiography; selective angiography into both vein grafts; selective angiography into the left internal mammary artery; 2 vessel percutaneous coronary intervention with PTCA and stenting of the left main into the proximal circumflex and PTCA/Angiosculpt scoring balloon in the proximal circumflex in-stent restenotic segment.  The patient arrived to the catheterization laboratory, severely tachypneic, coughing, and mottled and was still experiencing chest discomfort. His right femoral artery was punctured anteriorly and a 6 French sheath was inserted without difficulty. Lasix IV 40 mg x2 and morphine sulfate 2 mg x2 were administered. Diagnostic catheterization was done utilizing 6 French Judkins left and right catheters, as well as  a left internal mammary artery catheter. LIMA catheter was also able to cross the aortic valve with wire support and left ventricular pressures were recorded. The patient received additional morphine for preload reduction. A Foley catheter was also inserted. With the demonstration of a new 99% distal left main stenosis felt to be accounting for his severe cardiac compromise with pulmonary edema intervention to this vessel was performed. Angiomax bolus plus infusion was administered. At the start of the procedure the patient also had an i-STAT performed. He had already taken his morning Plavix at home and he received an additional 150 mg of clopidogrel. A 6 French XB 3.5 guide used for the intervention. A [place] wire was able to cross the distal 99% left main stenosis and was advanced into the distal left circumflex vessel. Initial dilatation with a Euphora 2.0x15 mm balloon was made in the distal left main extending into the very proximal circumflex. Very short inflations at 6,12, and 14 atmospheres were made up to a maximum of 12 seconds for each inflation. The patient had exacerbation of his cough with each inflation. The balloon was then exchanged upgraded to an 2.5x20 mm Empira balloon. 3 inflations at 11,13, and 13 atmospheres were performed, ranging from 15-20 seconds. An attempt was made to pass this balloon into the proximal circumflex, but this was not able to get into the previously stented proximal circumflex segment with in-stent restenosis, but was unable to cross. An Angiosculpt 2.0x10 mm scoring balloon was then inserted, but this was also unable to cross into the in-stent restenosis in the proximal circumflex. The Euphora 2.0x15 mm balloon was then reinserted and low-level dilatation  was done in the previously placed proximal stent in the circumflex. Following 2 inflations at 11 and 14 atmospheres, this was then removed and the angiosculpt balloon was then re-inserted and 2 inflations at 10 and up  to 16 atmospheres were made to allow for scoring of the diffuse in-stent plaque. With each balloon inflation, the patient did experience increasing shortness of breath with return of his cough. At this point, a 3.0x28 mm Xience Alpine DES stent was then inserted with juxtaposition distally to the previously placed proximal stent in the circumflex vessel. The Xience stent then extended proximally to cover the entire diffuse LM stenosis. This was dilated x2 at 15 and 17 atmospheres for 12 seconds each. A [place] Euphora3.25x20 mm balloon was used for post stent dilatation initially in the previously placed proximal circumflex stent with dilatations at 9, 12, and 15 atmospheres and then this balloon was then advanced proximally with several additional inflations in the left main with maximal inflation in the left main up to 18 atmospheres. Scout angiography confirmed the excellent result. During the procedure the patient also received 25 mcg of fentanyl and additional 2 mg of morphine. At the end of the procedure he was chest pain-free. He had 1800 cc of urine output in his Foley catheter. Catheterization laboratory with stable hemodynamics, and we'll be admitted to the coronary care unit with plans to continue bivalirudin administration for an additional 4 hours.  HEMODYNAMICS:  Central Aorta: 170/102  Left Ventricle: 170/24/39  ANGIOGRAPHY:  Left main: This was a long vessel with near ostial calcification. There was then diffuse 60-70% stenosis commencing in the proximal third and in the most distal aspect. The vessel was focally narrowed to 99% prior to giving rise to the proximal LAD and a large left circumflex vessel per  LAD: The LAD was totally occluded proximally after the takeoff of the first septal perforating artery.  Left circumflex: The left circumflex arose with a 90 angle from the left main. There was a stent in the proximal third of the vessel that had diffuse 80+ percent in-stent stenosis. The  circumflex gave rise to a large marginal branch. There were collaterals supplying the distal RCA, PDA, and PLA vessel from the circumflex vessel  Right coronary artery: 99% ostial stenosis and then was totally occluded proximally. There were left to right collaterals supplying a portion of the distal RCA.  LIMA to LAD: Patent and anastomose it to the middle LAD. The previously placed stent in the LAD beyond the anastomosis was patent.  SVG to circumflex vessel was occluded and this was old.  SVG to RCA was occluded and this was old.  LV pressures were recorded, but do to the patient's markedly elevated left ventricular end-diastolic pressure, left ventriculography was not performed.  IMPRESSION:  Severe native coronary obstructive disease with calcification of the proximal left main followed by diffuse 60-70% body stenosis with a focal 99% distal left main stenosis; total occlusion of the proximal LAD after a proximal septal perforating artery; diffuse, in-stent restenosis of 80% in the proximal circumflex; total proximal RCA occlusion with faint collaterals arising from the circumflex vessel, supplying the distal RCA.  Patent LIMA to the mid LAD with patent stent in the LAD beyond the anastomosis.  Occlusion of both vein grafts which had supplied the circumflex and right coronary arteries.  Markedly elevated left ventricular end-diastolic pressure at 39 mm with minimal transvalvular left ventricular to aortic gradient of only 3 mm.  Successful high risk percutaneous coronary intervention  involving the left main coronary artery and left circumflex coronary artery with PTCA and stenting of the left main extending into the proximal circumflex, and Angiosculpt balloon/ PTCA of the proximal circumflex in-stent restenosis with the 80% in-stent restenosis reduced to less than 20% and a diffuse LM stenosis of 60-70% with 99% distal narrowing reduced to 0% with insertion of a 3.0x28 mm Xience Alpine DES stent  post dilated to 3.28 mm.  Troy Sine, MD, [facility]  02/28/2014  4:01 PM        Echo Study Conclusions  - Left ventricle: The cavity size was mildly dilated. Systolic function was mildly to moderately reduced. The estimated ejection fraction was in the range of 40% to 45%. Wall motion was normal; there were no regional wall motion abnormalities. Features are consistent with a pseudonormal left ventricular filling pattern, with concomitant abnormal relaxation and increased filling pressure (grade 2 diastolic dysfunction). Doppler parameters are consistent with elevated ventricular end-diastolic filling pressure. - Aortic valve: Valve mobility was restricted. There was mild stenosis. Moderate regurgitation. Valve area: 1.64cm^2(VTI). Valve area: 1.61cm^2 (Vmax). - Mitral valve: Mild regurgitation. - Left atrium: The atrium was moderately dilated. - Right ventricle: Systolic function was mildly reduced. - Tricuspid valve: Mild regurgitation. - Pulmonary arteries: Systolic pressure was within the normal range.  Impressions:  - Compared to the prior study from October 2014 the left ventricle is now mildly dilated with mildly decreased left ventricular function (LVEF 40-45%) with hypokinesis in the basal and mid inferior and inferolateral wall. Right ventricular function is mildly decreased. Aortic insufficiency is now moderate (previosuly mild). There is stable mild aortic stenosis.    Assessment / Plan: 1. S/P high risk DES to LM and PTCA of LCX for in-stent restenosis - remains on Plavix. No recurrent chest pain. EKG actually looks better today. Has chronic chest tightness.   2. HTN  3. Ischemic CM - EF down to 40% now - weight is up. I have increased his Lasix to 80 mg for 3 days, then back to 40mg . Check labs to include BNP today. Send for CXR.   4. Tobacco abuse - I do not get the sense that he will stay off of his cigarettes.   5. Aortic valve  disease  Unfortunately, his overall prognosis is quite tenuous. I do not get the feeling that he is interested in taking care of himself and addressing risk factors.  Follow up as planned with Dr. Angelena Form.  Patient is agreeable to this plan and will call if any problems develop in the interim.   Please go to [facility] to [place] on the first floor for a chest Xray - you may walk in. .red

## 2014-03-13 NOTE — Patient Instructions (Addendum)
We need to check labs today  Please go to [facility] to [place] on the first floor for a chest Xray - you may walk in.   Increase your Lasix to two times a day for 3 days, then back to just one a day  Ok to use Flonase, Nasocort OTC sprays and plain Mucinex  See Dr. Angelena Form back as planned  Try to cut back on your salt  Congrats for not smoking - try to stay quit  Call the [place] office at [phone] if you have any questions, problems or concerns.

## 2014-03-15 ENCOUNTER — Telehealth: Payer: Self-pay | Admitting: Nurse Practitioner

## 2014-03-15 NOTE — Telephone Encounter (Signed)
New Message:  Pt states he is returning a call to [place], [facility].

## 2014-03-29 DIAGNOSIS — H2589 Other age-related cataract: Secondary | ICD-10-CM | POA: Diagnosis not present

## 2014-04-01 ENCOUNTER — Ambulatory Visit: Payer: Medicare Other | Admitting: Cardiovascular Disease

## 2014-04-17 ENCOUNTER — Ambulatory Visit (INDEPENDENT_AMBULATORY_CARE_PROVIDER_SITE_OTHER): Payer: Medicare Other | Admitting: Cardiovascular Disease

## 2014-04-17 ENCOUNTER — Encounter: Payer: Self-pay | Admitting: Cardiovascular Disease

## 2014-04-17 VITALS — BP 124/62 | HR 62 | Ht 65.0 in | Wt 151.0 lb

## 2014-04-17 DIAGNOSIS — I10 Essential (primary) hypertension: Secondary | ICD-10-CM

## 2014-04-17 DIAGNOSIS — I25810 Atherosclerosis of coronary artery bypass graft(s) without angina pectoris: Secondary | ICD-10-CM | POA: Diagnosis not present

## 2014-04-17 DIAGNOSIS — I255 Ischemic cardiomyopathy: Secondary | ICD-10-CM

## 2014-04-17 DIAGNOSIS — I2510 Atherosclerotic heart disease of native coronary artery without angina pectoris: Secondary | ICD-10-CM

## 2014-04-17 DIAGNOSIS — I2589 Other forms of chronic ischemic heart disease: Secondary | ICD-10-CM

## 2014-04-17 DIAGNOSIS — E785 Hyperlipidemia, unspecified: Secondary | ICD-10-CM

## 2014-04-17 DIAGNOSIS — Z720 Tobacco use: Secondary | ICD-10-CM

## 2014-04-17 DIAGNOSIS — F17200 Nicotine dependence, unspecified, uncomplicated: Secondary | ICD-10-CM

## 2014-04-17 DIAGNOSIS — I359 Nonrheumatic aortic valve disorder, unspecified: Secondary | ICD-10-CM

## 2014-04-17 NOTE — Progress Notes (Signed)
History of Present Illness: 68 y.o. Male with history of CAD s/p CABG in 1993, HTN, HLD, aortic stenosis here today for follow up. Admitted October 2013 and cath performed with occlusion of LAD, patent IMA to LAD, occluded proximal RCA with occluded SVG to PDA, severe disease in left main into Circumflex with occluded vein graft to Circumflex. Re-do CABG suggested but his aorta is severely calcified. He was seen by Dr. Servando Snare with CT surgery and not felt to be a candidate for re-do bypass. Imdur was added. He was admitted 10/14-10/18/14 with a non-STEMI. LHC (07/31/13): mLM 60 then 70, pLAD occluded, mLAD 99 after anastomosis of the graft, pCFX 50, OM stent with 70% ISR, pRCA occluded, SVG-PDA occluded, SVG-OM occluded, LIMA-LAD patent. PCI: Promus premier (2.25 x 20 mm) DES to the mid LAD extending back into the IMA graft. Echo (07/31/13) is: EF 55-60%, mild aortic stenosis (mean gradient 13), mild LAE. Readmitted 02/28/14 with inferolateral STEMI and found to have progression of disease in the left main/proximal Circumflex stent. A 3.0 x 28 mm Xience DES was placed from the Circumflex back into the left main. (The vein graft to the OM is known to be occluded). He did well following the procedure. LVEF 40-45% by echo 03/01/14 with moderate AI, mild AS. He was seen in our office 03/13/14 by Truitt Merle, NP and was mildly volume overloaded. Lasix was increased for several days.   He is here today for follow up. No chest pain or SOB. He is at his baseline.   Primary Care Physician: None  Last Lipid Profile:Lipid Panel     Component Value Date/Time   CHOL 143 03/01/2014 0129   TRIG 61 03/01/2014 0129   HDL 64 03/01/2014 0129   CHOLHDL 2.2 03/01/2014 0129   VLDL 12 03/01/2014 0129   LDLCALC 67 03/01/2014 0129    Past Medical History  Diagnosis Date  . Hypertension   . Hyperlipoproteinemia   . Aortic stenosis     a. Mod AS/AI by cath 07/2012.;  b.  Echo (07/31/13) is: EF 55-60%, mild aortic  stenosis (mean gradient 13);  c. Echo (5/15):  EF 40-45%, mild AS (mean 12 mmHg), mod AI  . Back pain   . Carotid artery occlusion     a. Dopplers 07/2012: no significant high grade obstruction.  . Hyperlipidemia   . Abnormal CT scan, kidney     07/2012 - multiple small cysts  . Cholelithiasis     Seen on CT 07/2012  . Coronary artery disease     a. CABG 10/1991. b. cath 07/2012 with prog dsz, turned down for re-do CABG - for med rx for now.; c. NSTEMI => PCI:DES to mLAD ext into IMA graft;  d. Inf STEMI (5/15):  LM 60-70% then 99% before LAD, pLAD occl, pCFX stent 80+% ISR, oRCA 99% then occl (L-R collats to dRCA), L-LAD ok with patent stent, S-CFX occl (old), S-RCA occl (old); PCI: LM ext into CFX with Xience Alpine DES  . Ischemic cardiomyopathy     a. EF 40-45%, inf and IL hypokinesis, Gr 2 DD, mild AS (mean 12 mmHg), mod AI, mild MR, mod LAE, mildly reduced RVSF, mild TR    Past Surgical History  Procedure Laterality Date  . Cardiac catheterization  04/03/99  . Carpal tunnel release  2007    Excision mass dorsal left wrist  . Hernia repair  1988  . Coronary artery bypass graft  1993  . Fasciotomy Right 07/30/2013  Procedure: OPEN FASCIOTOMY RIGHT RING FINGER & RIGHT SMALL FINGER MULTIPLE LEVELS;  Surgeon: Wynonia Sours, MD;  Location: [facility];  Service: Orthopedics;  Laterality: Right;    Current Outpatient Prescriptions  Medication Sig Dispense Refill  . amLODipine-benazepril (LOTREL) 5-20 MG per capsule Take 2 capsules by mouth daily.  60 capsule  11  . aspirin EC 81 MG tablet Take 81 mg by mouth at bedtime.      . clopidogrel (PLAVIX) 75 MG tablet Take 75 mg by mouth at bedtime.      Marland Kitchen ezetimibe-simvastatin (VYTORIN) 10-40 MG per tablet Take 1 tablet by mouth at bedtime.  30 tablet  11  . fexofenadine (ALLEGRA) 180 MG tablet Take 180 mg by mouth daily.       . metoprolol (LOPRESSOR) 50 MG tablet Take 0.5 tablets (25 mg total) by mouth 2 (two) times daily.       . nitroGLYCERIN (NITROSTAT) 0.4 MG SL tablet Place 1 tablet (0.4 mg total) under the tongue every 5 (five) minutes as needed for chest pain (up to 3 doses).  25 tablet  6  . Omega-3 Fatty Acids (FISH OIL) 1000 MG CAPS Take 1,000 mg by mouth 2 (two) times daily.        . pantoprazole (PROTONIX) 40 MG tablet Take 40 mg by mouth at bedtime.        No current facility-administered medications for this visit.    No Known Allergies  History   Social History  . Marital Status: Married    Spouse Name: N/A    Number of Children: 1  . Years of Education: N/A   Occupational History  . Plumbing     Retired in 2007   Social History Main Topics  . Smoking status: Current Every Day Smoker -- 1.00 packs/day for 50 years  . Smokeless tobacco: Not on file     Comment: has cut back   . Alcohol Use: 8.4 oz/week    14 Cans of beer per week     Comment: daily-6 daily  . Drug Use: No  . Sexual Activity: Not on file   Other Topics Concern  . Not on file   Social History Narrative  . No narrative on file    Family History  Problem Relation Age of Onset  . Heart attack Mother     MI  . Hypertension Sister   . Emphysema Brother     Review of Systems:  As stated in the HPI and otherwise negative.   BP 124/62  Pulse 62  Ht 5\' 5"  (1.651 m)  Wt 151 lb (68.493 kg)  BMI 25.13 kg/m2  Physical Examination: General: Well developed, well nourished, NAD HEENT: OP clear, mucus membranes moist SKIN: warm, dry. No rashes. Neuro: No focal deficits Musculoskeletal: Muscle strength 5/5 all ext Psychiatric: Mood and affect normal Neck: No JVD, no carotid bruits, no thyromegaly, no lymphadenopathy. Lungs:Clear bilaterally, no wheezes, rhonci, crackles Cardiovascular: Regular rate and rhythm. Systolic and diastolic murmur noted. No gallops or rubs. Abdomen:Soft. Bowel sounds present. Non-tender.  Extremities: No lower extremity edema. Pulses are 2 + in the bilateral DP/PT.  Cardiac cath  02/28/14: Left main: This was a long vessel with near ostial calcification. There was then diffuse 60-70% stenosis commencing in the proximal third and in the most distal aspect. The vessel was focally narrowed to 99% prior to giving rise to the proximal LAD and a large left circumflex vessel per  LAD: The LAD was  totally occluded proximally after the takeoff of the first septal perforating artery.  Left circumflex: The left circumflex arose with a 90 angle from the left main. There was a stent in the proximal third of the vessel that had diffuse 80+ percent in-stent stenosis. The circumflex gave rise to a large marginal branch. There were collaterals supplying the distal RCA, PDA, and PLA vessel from the circumflex vessel  Right coronary artery: 99% ostial stenosis and then was totally occluded proximally. There were left to right collaterals supplying a portion of the distal RCA.  LIMA to LAD: Patent and anastomose it to the middle LAD. The previously placed stent in the LAD beyond the anastomosis was patent.  SVG to circumflex vessel was occluded and this was old.  SVG to RCA was occluded and this was old.  Echo 03/01/14: Left ventricle: The cavity size was mildly dilated. Systolic function was mildly to moderately reduced. The estimated ejection fraction was in the range of 40% to 45%. Wall motion was normal; there were no regional wall motion abnormalities. Features are consistent with a pseudonormal left ventricular filling pattern, with concomitant abnormal relaxation and increased filling pressure (grade 2 diastolic dysfunction). Doppler parameters are consistent with elevated ventricular end-diastolic filling pressure. - Aortic valve: Valve mobility was restricted. There was mild stenosis. Moderate regurgitation. Valve area: 1.64cm^2(VTI). Valve area: 1.61cm^2 (Vmax). - Mitral valve: Mild regurgitation. - Left atrium: The atrium was moderately dilated. - Right ventricle: Systolic  function was mildly reduced. - Tricuspid valve: Mild regurgitation. - Pulmonary arteries: Systolic pressure was within the normal range. Impressions:  - Compared to the prior study from October 2014 the left ventricle is now mildly dilated with mildly decreased left ventricular function (LVEF 40-45%) with hypokinesis in the basal and mid inferior and inferolateral wall. Right ventricular function is mildly decreased. Aortic insufficiency is now moderate (previosuly mild). There is stable mild aortic stenosis.  Assessment and Plan:   1. CAD: s/p CABG 1993 and most recent cath May 2015 with patent IMA to LAD, occlusion of both vein grafts to the PDA and Circumflex. His left main and proximal Circumflex disease had progressed. His left main/Circ was treated with a DES. He is not a redo CABG candidate.  Doing well. Continue current meds. He stopped Imdur on hi sown.   2. CAROTID ARTERY DISEASE: The patient had Dopplers done in the fall of 2013 and does not have significant carotid disease   3. Hyperlipidemia: Continue statin. Lipids controlled.   4. Aortic valve disease: Moderate AI and mild AS by echo May 2015. Will follow.    5. HTN: BP controlled.   6. Tobacco abuse: Smoking cessation encouraged.   7. Ischemic cardiomyopathy: Continue meds. No changes.

## 2014-04-17 NOTE — Patient Instructions (Signed)
Your physician wants you to follow-up in:  6 months. You will receive a reminder letter in the mail two months in advance. If you don't receive a letter, please call our office to schedule the follow-up appointment.   

## 2014-06-20 ENCOUNTER — Other Ambulatory Visit: Payer: Self-pay

## 2014-06-20 MED ORDER — NITROGLYCERIN 0.4 MG SL SUBL: 0.4000 mg | SUBLINGUAL_TABLET | SUBLINGUAL | Status: DC | PRN

## 2014-07-02 ENCOUNTER — Other Ambulatory Visit: Payer: Self-pay | Admitting: Cardiovascular Disease

## 2014-07-12 DIAGNOSIS — Z23 Encounter for immunization: Secondary | ICD-10-CM | POA: Diagnosis not present

## 2014-07-16 ENCOUNTER — Other Ambulatory Visit: Payer: Self-pay | Admitting: Cardiovascular Disease

## 2014-07-17 DIAGNOSIS — L570 Actinic keratosis: Secondary | ICD-10-CM | POA: Diagnosis not present

## 2014-07-17 DIAGNOSIS — D0460 Carcinoma in situ of skin of unspecified upper limb, including shoulder: Secondary | ICD-10-CM | POA: Diagnosis not present

## 2014-07-31 ENCOUNTER — Other Ambulatory Visit: Payer: Self-pay | Admitting: Cardiovascular Disease

## 2014-08-01 ENCOUNTER — Encounter (HOSPITAL_COMMUNITY): Payer: Self-pay | Admitting: Emergency Medicine

## 2014-08-01 ENCOUNTER — Inpatient Hospital Stay (HOSPITAL_COMMUNITY)
Admission: EM | Admit: 2014-08-01 | Discharge: 2014-08-07 | DRG: 246 | Disposition: A | Discharge status: Home / Self Care | Payer: Medicare Other | Attending: Cardiology | Admitting: Cardiology

## 2014-08-01 ENCOUNTER — Emergency Department (HOSPITAL_COMMUNITY): Payer: Medicare Other

## 2014-08-01 ENCOUNTER — Encounter (HOSPITAL_COMMUNITY)
Admission: EM | Disposition: A | Discharge status: Home / Self Care | Payer: Medicare Other | Source: Home / Self Care | Attending: Cardiology

## 2014-08-01 DIAGNOSIS — J80 Acute respiratory distress syndrome: Secondary | ICD-10-CM | POA: Diagnosis not present

## 2014-08-01 DIAGNOSIS — I200 Unstable angina: Secondary | ICD-10-CM | POA: Diagnosis not present

## 2014-08-01 DIAGNOSIS — I25110 Atherosclerotic heart disease of native coronary artery with unstable angina pectoris: Secondary | ICD-10-CM

## 2014-08-01 DIAGNOSIS — R0603 Acute respiratory distress: Secondary | ICD-10-CM

## 2014-08-01 DIAGNOSIS — I25810 Atherosclerosis of coronary artery bypass graft(s) without angina pectoris: Secondary | ICD-10-CM | POA: Diagnosis not present

## 2014-08-01 DIAGNOSIS — I249 Acute ischemic heart disease, unspecified: Secondary | ICD-10-CM | POA: Diagnosis present

## 2014-08-01 DIAGNOSIS — Z7902 Long term (current) use of antithrombotics/antiplatelets: Secondary | ICD-10-CM

## 2014-08-01 DIAGNOSIS — Z955 Presence of coronary angioplasty implant and graft: Secondary | ICD-10-CM | POA: Diagnosis not present

## 2014-08-01 DIAGNOSIS — Y831 Surgical operation with implant of artificial internal device as the cause of abnormal reaction of the patient, or of later complication, without mention of misadventure at the time of the procedure: Secondary | ICD-10-CM | POA: Diagnosis present

## 2014-08-01 DIAGNOSIS — F1010 Alcohol abuse, uncomplicated: Secondary | ICD-10-CM | POA: Diagnosis present

## 2014-08-01 DIAGNOSIS — I10 Essential (primary) hypertension: Secondary | ICD-10-CM

## 2014-08-01 DIAGNOSIS — I214 Non-ST elevation (NSTEMI) myocardial infarction: Secondary | ICD-10-CM | POA: Diagnosis present

## 2014-08-01 DIAGNOSIS — I25700 Atherosclerosis of coronary artery bypass graft(s), unspecified, with unstable angina pectoris: Secondary | ICD-10-CM | POA: Diagnosis not present

## 2014-08-01 DIAGNOSIS — Z8249 Family history of ischemic heart disease and other diseases of the circulatory system: Secondary | ICD-10-CM

## 2014-08-01 DIAGNOSIS — R110 Nausea: Secondary | ICD-10-CM | POA: Diagnosis not present

## 2014-08-01 DIAGNOSIS — R079 Chest pain, unspecified: Secondary | ICD-10-CM | POA: Diagnosis not present

## 2014-08-01 DIAGNOSIS — Z720 Tobacco use: Secondary | ICD-10-CM

## 2014-08-01 DIAGNOSIS — I5021 Acute systolic (congestive) heart failure: Secondary | ICD-10-CM

## 2014-08-01 DIAGNOSIS — I255 Ischemic cardiomyopathy: Secondary | ICD-10-CM

## 2014-08-01 DIAGNOSIS — I2510 Atherosclerotic heart disease of native coronary artery without angina pectoris: Secondary | ICD-10-CM

## 2014-08-01 DIAGNOSIS — I2582 Chronic total occlusion of coronary artery: Secondary | ICD-10-CM | POA: Diagnosis present

## 2014-08-01 DIAGNOSIS — Z79899 Other long term (current) drug therapy: Secondary | ICD-10-CM | POA: Diagnosis not present

## 2014-08-01 DIAGNOSIS — R0602 Shortness of breath: Secondary | ICD-10-CM | POA: Diagnosis not present

## 2014-08-01 DIAGNOSIS — F17210 Nicotine dependence, cigarettes, uncomplicated: Secondary | ICD-10-CM | POA: Diagnosis present

## 2014-08-01 DIAGNOSIS — E876 Hypokalemia: Secondary | ICD-10-CM | POA: Diagnosis not present

## 2014-08-01 DIAGNOSIS — I5022 Chronic systolic (congestive) heart failure: Secondary | ICD-10-CM | POA: Diagnosis not present

## 2014-08-01 DIAGNOSIS — E785 Hyperlipidemia, unspecified: Secondary | ICD-10-CM | POA: Diagnosis present

## 2014-08-01 DIAGNOSIS — I517 Cardiomegaly: Secondary | ICD-10-CM | POA: Diagnosis not present

## 2014-08-01 DIAGNOSIS — Z951 Presence of aortocoronary bypass graft: Secondary | ICD-10-CM | POA: Diagnosis not present

## 2014-08-01 DIAGNOSIS — Z825 Family history of asthma and other chronic lower respiratory diseases: Secondary | ICD-10-CM

## 2014-08-01 DIAGNOSIS — T82858A Stenosis of vascular prosthetic devices, implants and grafts, initial encounter: Secondary | ICD-10-CM | POA: Diagnosis present

## 2014-08-01 DIAGNOSIS — Z7982 Long term (current) use of aspirin: Secondary | ICD-10-CM

## 2014-08-01 DIAGNOSIS — I252 Old myocardial infarction: Secondary | ICD-10-CM

## 2014-08-01 DIAGNOSIS — I350 Nonrheumatic aortic (valve) stenosis: Secondary | ICD-10-CM

## 2014-08-01 DIAGNOSIS — R000 Tachycardia, unspecified: Secondary | ICD-10-CM | POA: Diagnosis present

## 2014-08-01 DIAGNOSIS — R202 Paresthesia of skin: Secondary | ICD-10-CM | POA: Diagnosis not present

## 2014-08-01 HISTORY — PX: LEFT HEART CATHETERIZATION WITH CORONARY/GRAFT ANGIOGRAM: SHX5450

## 2014-08-01 LAB — TROPONIN I: TROPONIN I: 2.19 ng/mL — AB (ref <0.30)

## 2014-08-01 LAB — BASIC METABOLIC PANEL
Anion gap: 19 — ABNORMAL HIGH (ref 5–15)
BUN: 20 mg/dL (ref 6–23)
CHLORIDE: 100 meq/L (ref 96–112)
CO2: 22 mEq/L (ref 19–32)
Calcium: 9.3 mg/dL (ref 8.4–10.5)
Creatinine, Ser: 1.01 mg/dL (ref 0.50–1.35)
GFR calc Af Amer: 85 mL/min — ABNORMAL LOW (ref >90)
GFR calc non Af Amer: 73 mL/min — ABNORMAL LOW (ref >90)
GLUCOSE: 132 mg/dL — AB (ref 70–99)
POTASSIUM: 3.5 meq/L — AB (ref 3.7–5.3)
Sodium: 141 mEq/L (ref 137–147)

## 2014-08-01 LAB — APTT: APTT: 30 s (ref 24–37)

## 2014-08-01 LAB — CBC
HCT: 46.3 % (ref 39.0–52.0)
HEMOGLOBIN: 15.7 g/dL (ref 13.0–17.0)
MCH: 31.8 pg (ref 26.0–34.0)
MCHC: 33.9 g/dL (ref 30.0–36.0)
MCV: 93.9 fL (ref 78.0–100.0)
Platelets: 218 10*3/uL (ref 150–400)
RBC: 4.93 MIL/uL (ref 4.22–5.81)
RDW: 12.5 % (ref 11.5–15.5)
WBC: 14 10*3/uL — AB (ref 4.0–10.5)

## 2014-08-01 LAB — I-STAT TROPONIN, ED: TROPONIN I, POC: 0.08 ng/mL (ref 0.00–0.08)

## 2014-08-01 LAB — I-STAT CHEM 8, ED
BUN: 20 mg/dL (ref 6–23)
CALCIUM ION: 1.02 mmol/L — AB (ref 1.13–1.30)
CHLORIDE: 103 meq/L (ref 96–112)
Creatinine, Ser: 1 mg/dL (ref 0.50–1.35)
Glucose, Bld: 133 mg/dL — ABNORMAL HIGH (ref 70–99)
HEMATOCRIT: 50 % (ref 39.0–52.0)
HEMOGLOBIN: 17 g/dL (ref 13.0–17.0)
POTASSIUM: 3.3 meq/L — AB (ref 3.7–5.3)
Sodium: 140 mEq/L (ref 137–147)
TCO2: 22 mmol/L (ref 0–100)

## 2014-08-01 LAB — MRSA PCR SCREENING: MRSA by PCR: NEGATIVE

## 2014-08-01 LAB — PROTIME-INR
INR: 1.07 (ref 0.00–1.49)
Prothrombin Time: 14 seconds (ref 11.6–15.2)

## 2014-08-01 LAB — POCT ACTIVATED CLOTTING TIME: ACTIVATED CLOTTING TIME: 298 s

## 2014-08-01 SURGERY — LEFT HEART CATHETERIZATION WITH CORONARY/GRAFT ANGIOGRAM
Anesthesia: LOCAL

## 2014-08-01 MED ORDER — THIAMINE HCL 100 MG/ML IJ SOLN
100.0000 mg | Freq: Every day | INTRAMUSCULAR | Status: DC
  Filled 2014-08-01 (×7): qty 1

## 2014-08-01 MED ORDER — CLOPIDOGREL BISULFATE 300 MG PO TABS
ORAL_TABLET | ORAL | Status: AC
  Administered 2014-08-01: 75 mg via ORAL
  Filled 2014-08-01: qty 1

## 2014-08-01 MED ORDER — ACETAMINOPHEN 325 MG PO TABS: 650.0000 mg | ORAL_TABLET | ORAL | Status: DC | PRN

## 2014-08-01 MED ORDER — LORATADINE 10 MG PO TABS
10.0000 mg | ORAL_TABLET | Freq: Every day | ORAL | Status: DC
  Administered 2014-08-01 – 2014-08-07 (×7): 10 mg via ORAL
  Filled 2014-08-01 (×7): qty 1

## 2014-08-01 MED ORDER — NITROGLYCERIN 0.4 MG SL SUBL: 0.4000 mg | SUBLINGUAL_TABLET | SUBLINGUAL | Status: DC | PRN

## 2014-08-01 MED ORDER — SODIUM CHLORIDE 0.9 % IJ SOLN: 3.0000 mL | Freq: Two times a day (BID) | INTRAMUSCULAR | Status: DC

## 2014-08-01 MED ORDER — SODIUM CHLORIDE 0.9 % IV SOLN: 250.0000 mL | INTRAVENOUS | Status: DC | PRN

## 2014-08-01 MED ORDER — SODIUM CHLORIDE 0.9 % IJ SOLN: 3.0000 mL | INTRAMUSCULAR | Status: DC | PRN

## 2014-08-01 MED ORDER — MORPHINE SULFATE 2 MG/ML IJ SOLN
2.0000 mg | INTRAMUSCULAR | Status: DC | PRN
  Administered 2014-08-01: 2 mg via INTRAVENOUS
  Filled 2014-08-01: qty 1

## 2014-08-01 MED ORDER — NITROGLYCERIN 1 MG/10 ML FOR IR/CATH LAB
INTRA_ARTERIAL | Status: AC
  Filled 2014-08-01: qty 10

## 2014-08-01 MED ORDER — LORAZEPAM 1 MG PO TABS: 0.0000 mg | ORAL_TABLET | Freq: Four times a day (QID) | ORAL | Status: AC

## 2014-08-01 MED ORDER — HEPARIN (PORCINE) IN NACL 2-0.9 UNIT/ML-% IJ SOLN
INTRAMUSCULAR | Status: AC
  Filled 2014-08-01: qty 500

## 2014-08-01 MED ORDER — NITROGLYCERIN IN D5W 200-5 MCG/ML-% IV SOLN
5.0000 ug/min | INTRAVENOUS | Status: DC
  Administered 2014-08-01: 20 ug/min via INTRAVENOUS

## 2014-08-01 MED ORDER — HEPARIN (PORCINE) IN NACL 2-0.9 UNIT/ML-% IJ SOLN
INTRAMUSCULAR | Status: AC
  Filled 2014-08-01: qty 1500

## 2014-08-01 MED ORDER — BIVALIRUDIN 250 MG IV SOLR
INTRAVENOUS | Status: AC
  Filled 2014-08-01: qty 250

## 2014-08-01 MED ORDER — EZETIMIBE-SIMVASTATIN 10-40 MG PO TABS
1.0000 | ORAL_TABLET | Freq: Every day | ORAL | Status: DC
  Administered 2014-08-01 – 2014-08-06 (×6): 1 via ORAL
  Filled 2014-08-01 (×7): qty 1

## 2014-08-01 MED ORDER — LORAZEPAM 1 MG PO TABS: 0.0000 mg | ORAL_TABLET | Freq: Two times a day (BID) | ORAL | Status: AC

## 2014-08-01 MED ORDER — HEPARIN BOLUS VIA INFUSION
4000.0000 [IU] | Freq: Once | INTRAVENOUS | Status: DC
Start: 2014-08-01 — End: 2014-08-01
  Filled 2014-08-01: qty 4000

## 2014-08-01 MED ORDER — SODIUM CHLORIDE 0.9 % IJ SOLN
3.0000 mL | Freq: Two times a day (BID) | INTRAMUSCULAR | Status: DC
  Administered 2014-08-01 – 2014-08-04 (×3): 3 mL via INTRAVENOUS

## 2014-08-01 MED ORDER — FUROSEMIDE 10 MG/ML IJ SOLN
INTRAMUSCULAR | Status: AC
  Filled 2014-08-01: qty 4

## 2014-08-01 MED ORDER — BENAZEPRIL HCL 40 MG PO TABS
40.0000 mg | ORAL_TABLET | Freq: Every day | ORAL | Status: DC
  Administered 2014-08-02 – 2014-08-07 (×6): 40 mg via ORAL
  Filled 2014-08-01 (×6): qty 1

## 2014-08-01 MED ORDER — METOPROLOL TARTRATE 1 MG/ML IV SOLN
INTRAVENOUS | Status: AC
  Filled 2014-08-01: qty 5

## 2014-08-01 MED ORDER — AMLODIPINE BESYLATE 10 MG PO TABS
10.0000 mg | ORAL_TABLET | Freq: Every day | ORAL | Status: DC
  Administered 2014-08-02 – 2014-08-07 (×6): 10 mg via ORAL
  Filled 2014-08-01 (×6): qty 1

## 2014-08-01 MED ORDER — METOPROLOL TARTRATE 50 MG PO TABS
50.0000 mg | ORAL_TABLET | Freq: Two times a day (BID) | ORAL | Status: DC
  Administered 2014-08-01 – 2014-08-07 (×12): 50 mg via ORAL
  Filled 2014-08-01 (×15): qty 1

## 2014-08-01 MED ORDER — CLOPIDOGREL BISULFATE 75 MG PO TABS
75.0000 mg | ORAL_TABLET | Freq: Every day | ORAL | Status: DC
  Administered 2014-08-01 – 2014-08-06 (×6): 75 mg via ORAL
  Filled 2014-08-01 (×7): qty 1

## 2014-08-01 MED ORDER — OMEGA-3-ACID ETHYL ESTERS 1 G PO CAPS
1.0000 g | ORAL_CAPSULE | Freq: Two times a day (BID) | ORAL | Status: DC
  Administered 2014-08-01 – 2014-08-07 (×11): 1 g via ORAL
  Filled 2014-08-01 (×13): qty 1

## 2014-08-01 MED ORDER — HEPARIN (PORCINE) IN NACL 100-0.45 UNIT/ML-% IJ SOLN
12.0000 [IU]/kg/h | INTRAMUSCULAR | Status: DC
  Filled 2014-08-01: qty 250

## 2014-08-01 MED ORDER — MIDAZOLAM HCL 2 MG/2ML IJ SOLN
INTRAMUSCULAR | Status: AC
  Filled 2014-08-01: qty 2

## 2014-08-01 MED ORDER — HEPARIN BOLUS VIA INFUSION
3500.0000 [IU] | Freq: Once | INTRAVENOUS | Status: DC
  Filled 2014-08-01: qty 3500

## 2014-08-01 MED ORDER — AMLODIPINE BESY-BENAZEPRIL HCL 5-20 MG PO CAPS: 2.0000 | ORAL_CAPSULE | Freq: Every day | ORAL | Status: DC

## 2014-08-01 MED ORDER — LIDOCAINE HCL (PF) 1 % IJ SOLN
INTRAMUSCULAR | Status: AC
  Filled 2014-08-01: qty 30

## 2014-08-01 MED ORDER — NITROGLYCERIN IN D5W 200-5 MCG/ML-% IV SOLN
5.0000 ug/min | Freq: Once | INTRAVENOUS | Status: AC
  Administered 2014-08-01: 5 ug/min via INTRAVENOUS
  Filled 2014-08-01: qty 250

## 2014-08-01 MED ORDER — ONDANSETRON HCL 4 MG/2ML IJ SOLN
4.0000 mg | Freq: Once | INTRAMUSCULAR | Status: AC
Start: 2014-08-01 — End: 2014-08-01
  Administered 2014-08-01: 4 mg via INTRAVENOUS
  Filled 2014-08-01: qty 2

## 2014-08-01 MED ORDER — PANTOPRAZOLE SODIUM 40 MG PO TBEC
40.0000 mg | DELAYED_RELEASE_TABLET | Freq: Every day | ORAL | Status: DC
  Administered 2014-08-01 – 2014-08-06 (×6): 40 mg via ORAL
  Filled 2014-08-01 (×6): qty 1

## 2014-08-01 MED ORDER — SODIUM CHLORIDE 0.9 % IV SOLN: 1.0000 mL/kg/h | INTRAVENOUS | Status: AC

## 2014-08-01 MED ORDER — ONDANSETRON HCL 4 MG/2ML IJ SOLN: 4.0000 mg | Freq: Four times a day (QID) | INTRAMUSCULAR | Status: DC | PRN

## 2014-08-01 MED ORDER — MORPHINE SULFATE 4 MG/ML IJ SOLN
4.0000 mg | INTRAMUSCULAR | Status: AC
  Administered 2014-08-01: 4 mg via INTRAVENOUS
  Filled 2014-08-01: qty 1

## 2014-08-01 MED ORDER — FOLIC ACID 1 MG PO TABS
1.0000 mg | ORAL_TABLET | Freq: Every day | ORAL | Status: DC
  Administered 2014-08-01 – 2014-08-07 (×7): 1 mg via ORAL
  Filled 2014-08-01 (×7): qty 1

## 2014-08-01 MED ORDER — LORAZEPAM 2 MG/ML IJ SOLN: 1.0000 mg | Freq: Four times a day (QID) | INTRAMUSCULAR | Status: AC | PRN

## 2014-08-01 MED ORDER — FUROSEMIDE 40 MG PO TABS: 40.0000 mg | ORAL_TABLET | Freq: Every day | ORAL | Status: DC

## 2014-08-01 MED ORDER — VITAMIN B-1 100 MG PO TABS
100.0000 mg | ORAL_TABLET | Freq: Every day | ORAL | Status: DC
  Administered 2014-08-01 – 2014-08-07 (×7): 100 mg via ORAL
  Filled 2014-08-01 (×7): qty 1

## 2014-08-01 MED ORDER — ASPIRIN EC 81 MG PO TBEC
81.0000 mg | DELAYED_RELEASE_TABLET | Freq: Every day | ORAL | Status: DC
  Administered 2014-08-02 – 2014-08-07 (×6): 81 mg via ORAL
  Filled 2014-08-01 (×7): qty 1

## 2014-08-01 MED ORDER — SODIUM CHLORIDE 0.9 % IJ SOLN
3.0000 mL | Freq: Two times a day (BID) | INTRAMUSCULAR | Status: DC
  Administered 2014-08-02 – 2014-08-07 (×7): 3 mL via INTRAVENOUS

## 2014-08-01 MED ORDER — POTASSIUM CHLORIDE CRYS ER 20 MEQ PO TBCR: 40.0000 meq | EXTENDED_RELEASE_TABLET | Freq: Once | ORAL | Status: DC

## 2014-08-01 MED ORDER — FENTANYL CITRATE 0.05 MG/ML IJ SOLN
INTRAMUSCULAR | Status: AC
  Filled 2014-08-01: qty 2

## 2014-08-01 MED ORDER — ADULT MULTIVITAMIN W/MINERALS CH
1.0000 | ORAL_TABLET | Freq: Every day | ORAL | Status: DC
  Administered 2014-08-01 – 2014-08-07 (×7): 1 via ORAL
  Filled 2014-08-01 (×8): qty 1

## 2014-08-01 MED ORDER — LORAZEPAM 1 MG PO TABS: 1.0000 mg | ORAL_TABLET | Freq: Four times a day (QID) | ORAL | Status: AC | PRN

## 2014-08-01 MED ORDER — SODIUM CHLORIDE 0.9 % IV SOLN: 1.0000 mL/kg/h | INTRAVENOUS | Status: DC

## 2014-08-01 MED ORDER — METOPROLOL TARTRATE 1 MG/ML IV SOLN: 5.0000 mg | Freq: Once | INTRAVENOUS | Status: DC

## 2014-08-01 NOTE — Progress Notes (Signed)
CRITICAL VALUE ALERT  Critical value received:  Troponin 2.19  Date of notification: 08/01/14  Time of notification:  2050  Critical value read back:Yes.    Nurse who received alert:  [place], RN  MD notified (1st page):  Dr. Elias Else  Time of first page:  2055  MD notified (2nd page):  Time of second page:  Responding MD:  Dr. Elias Else  Time MD responded:  2100  Expected value d/t NSTEMI

## 2014-08-01 NOTE — ED Provider Notes (Signed)
CSN: [id number]     Arrival date & time 08/01/14  1343 History   First MD Initiated Contact with Patient 08/01/14 1357     Chief Complaint  Patient presents with  . Chest Pain     (Consider location/radiation/quality/duration/timing/severity/associated sxs/prior Treatment) HPI Comments: Pt is a 68 y/o male with hx of ischemic cardiomyopathy - he had recently been admitted with STEMI and has been seen outpt as well.  He has c/o CP which is dull, sternal and severe, became much worse at 8 AM this morning, responded to nitro somewhat.  Has chronic cough and SOB.  Sx are persistent, feels them weekly but not this bad.  Required stent again this recent admit and has been seen by CT surgery and is not a candidate for CABG redo.  Patient is a 68 y.o. male presenting with chest pain. The history is provided by the patient.  Chest Pain   Past Medical History  Diagnosis Date  . Hypertension   . Hyperlipoproteinemia   . Aortic stenosis     a. Mod AS/AI by cath 07/2012.;  b.  Echo (07/31/13) is: EF 55-60%, mild aortic stenosis (mean gradient 13);  c. Echo (5/15):  EF 40-45%, mild AS (mean 12 mmHg), mod AI  . Back pain   . Carotid artery occlusion     a. Dopplers 07/2012: no significant high grade obstruction.  . Hyperlipidemia   . Abnormal CT scan, kidney     07/2012 - multiple small cysts  . Cholelithiasis     Seen on CT 07/2012  . Coronary artery disease     a. CABG 10/1991. b. cath 07/2012 with prog dsz, turned down for re-do CABG - for med rx for now.; c. NSTEMI/PCI: DES to mLAD ext into IMA graft;  d. Inf STEMI (5/15):  LM 60-70% then 99% before LAD, pLAD occl, pCFX stent 80+% ISR, oRCA 99% then occl (L-R collats to dRCA), L-LAD ok with patent stent, S-CFX occl (old), S-RCA occl (old); PCI: LM ext into CFX with Xience Alpine DES  . Ischemic cardiomyopathy     a. 02/2014 Echo: EF 40-45%, inf and IL hypokinesis, Gr 2 DD, mild AS (mean 12 mmHg), mod AI, mild MR, mod LAE, mildly reduced RVSF,  mild TR   Past Surgical History  Procedure Laterality Date  . Cardiac catheterization  04/03/99  . Carpal tunnel release  2007    Excision mass dorsal left wrist  . Hernia repair  1988  . Coronary artery bypass graft  1993  . Fasciotomy Right 07/30/2013    Procedure: OPEN FASCIOTOMY RIGHT RING FINGER & RIGHT SMALL FINGER MULTIPLE LEVELS;  Surgeon: Wynonia Sours, MD;  Location: [facility];  Service: Orthopedics;  Laterality: Right;   Family History  Problem Relation Age of Onset  . Heart attack Mother     died @ 46.  Marland Kitchen Hypertension Sister   . Emphysema Brother    History  Substance Use Topics  . Smoking status: Current Every Day Smoker -- 1.00 packs/day for 50 years  . Smokeless tobacco: Not on file     Comment: has cut back   . Alcohol Use: 8.4 oz/week    14 Cans of beer per week     Comment: daily-6 daily    Review of Systems  Cardiovascular: Positive for chest pain.  All other systems reviewed and are negative.     Allergies  Review of patient's allergies indicates no known allergies.  Home Medications  Prior to Admission medications   Medication Sig Start Date End Date Taking? Authorizing Provider  aspirin EC 81 MG tablet Take 81 mg by mouth at bedtime.   Yes Historical Provider, MD  nitroGLYCERIN (NITROSTAT) 0.4 MG SL tablet Place 0.4 mg under the tongue every 5 (five) minutes as needed for chest pain.   Yes Historical Provider, MD  amLODipine-benazepril (LOTREL) 5-20 MG per capsule Take 2 capsules by mouth daily.    Historical Provider, MD  clopidogrel (PLAVIX) 75 MG tablet Take 75 mg by mouth at bedtime.    Historical Provider, MD  ezetimibe-simvastatin (VYTORIN) 10-40 MG per tablet Take 1 tablet by mouth daily.    Historical Provider, MD  fexofenadine (ALLEGRA) 180 MG tablet Take 180 mg by mouth daily.     Historical Provider, MD  furosemide (LASIX) 40 MG tablet Take 40 mg by mouth daily.    Historical Provider, MD  metoprolol (LOPRESSOR) 50 MG  tablet Take 50 mg by mouth 2 (two) times daily.    Historical Provider, MD  Omega-3 Fatty Acids (FISH OIL) 1000 MG CAPS Take 1,000 mg by mouth 2 (two) times daily.      Historical Provider, MD  pantoprazole (PROTONIX) 40 MG tablet Take 40 mg by mouth at bedtime.     Historical Provider, MD   BP 139/76  Pulse 106  Temp(Src) 98.1 F (36.7 C) (Oral)  Resp 18  Ht 5\' 5"  (1.651 m)  Wt 151 lb (68.493 kg)  BMI 25.13 kg/m2  SpO2 97% Physical Exam  Nursing note and vitals reviewed. Constitutional: He appears well-developed and well-nourished. No distress.  HENT:  Head: Normocephalic and atraumatic.  Mouth/Throat: Oropharynx is clear and moist. No oropharyngeal exudate.  Eyes: Conjunctivae and EOM are normal. Pupils are equal, round, and reactive to light. Right eye exhibits no discharge. Left eye exhibits no discharge. No scleral icterus.  Neck: Normal range of motion. Neck supple. No JVD present. No thyromegaly present.  Cardiovascular: Regular rhythm, normal heart sounds and intact distal pulses.  Exam reveals no gallop and no friction rub.   No murmur heard. tachycardic  Pulmonary/Chest: Effort normal and breath sounds normal. No respiratory distress. He has no wheezes. He has no rales.  Abdominal: Soft. Bowel sounds are normal. He exhibits no distension and no mass. There is no tenderness.  Musculoskeletal: Normal range of motion. He exhibits no edema and no tenderness.  Lymphadenopathy:    He has no cervical adenopathy.  Neurological: He is alert. Coordination normal.  Skin: Skin is warm and dry. No rash noted. No erythema.  Psychiatric: He has a normal mood and affect. His behavior is normal.    ED Course  Procedures (including critical care time) Labs Review Labs Reviewed  CBC - Abnormal; Notable for the following:    WBC 14.0 (*)    All other components within normal limits  BASIC METABOLIC PANEL - Abnormal; Notable for the following:    Potassium 3.5 (*)    Glucose, Bld 132  (*)    GFR calc non Af Amer 73 (*)    GFR calc Af Amer 85 (*)    Anion gap 19 (*)    All other components within normal limits  I-STAT CHEM 8, ED - Abnormal; Notable for the following:    Potassium 3.3 (*)    Glucose, Bld 133 (*)    Calcium, Ion 1.02 (*)    All other components within normal limits  APTT  PROTIME-INR  HEPARIN LEVEL (UNFRACTIONATED)  I-STAT  [place], ED  [facility], ED    Imaging Review Dg Chest Port 1 View  08/01/2014   CLINICAL DATA:  Mid chest pain and shortness of breath with left arm pain with tingling and numbness in left hand fingers. History of coronary artery disease.  EXAM: PORTABLE CHEST - 1 VIEW  COMPARISON:  03/13/2014  FINDINGS: Heart size and pulmonary vascularity are normal and the lungs are clear. Prior CABG. No acute osseous abnormality.  IMPRESSION: No acute abnormalities.   Electronically Signed   By: Rozetta Nunnery M.D.   On: 08/01/2014 15:18     EKG Interpretation   Date/Time:  Thursday August 01 2014 13:43:38 EDT Ventricular Rate:  113 PR Interval:  163 QRS Duration: 107 QT Interval:  309 QTC Calculation: 424 R Axis:   28 Text Interpretation:  Sinus tachycardia LVH with secondary repolarization  abnormality ST depression, consider ischemia, diffuse lds Baseline wander  in lead(s) V2 Since last tracing rate faster Abnormal ekg Confirmed by  Sabra Heck  MD, Arabelle Bollig ([id number]) on 08/01/2014 1:59:44 PM      MDM   Final diagnoses:  Ischemic cardiomyopathy  Unstable angina  Acute respiratory distress    Pt is acutely ill, tachycardic and has ST depression concerning but not altogether different from prior ECG.  No elevations./  Bp increased.  Will start nitro gtt, consult cardiology, trop, heparin.  Pt has ongoing symptoms that are severe, he has increased dyspnea - CXR pending at this time - ECG very abnormal, I have discussed case with Cardiology who is seeing the pt at this time.  The pt continues to be tachycardic and dyspneic and  appears acutely ill  NItro gtt hepratin gtt  Cardiology is going to take the pt to the cath lab immediately  CRITICAL CARE Performed by: Johnna Acosta Total critical care time: 35 Critical care time was exclusive of separately billable procedures and treating other patients. Critical care was necessary to treat or prevent imminent or life-threatening deterioration. Critical care was time spent personally by me on the following activities: development of treatment plan with patient and/or surrogate as well as nursing, discussions with consultants, evaluation of patient's response to treatment, examination of patient, obtaining history from patient or surrogate, ordering and performing treatments and interventions, ordering and review of laboratory studies, ordering and review of radiographic studies, pulse oximetry and re-evaluation of patient's condition.   Meds given in ED:  Medications  heparin ADULT infusion 100 units/mL (25000 units/250 mL) (not administered)  heparin bolus via infusion 3,500 Units ( Intravenous MAR Hold 08/01/14 1506)  metoprolol (LOPRESSOR) injection 5 mg ( Intravenous MAR Hold 08/01/14 1506)  potassium chloride SA (K-DUR,KLOR-CON) CR tablet 40 mEq ( Oral MAR Hold 08/01/14 1506)  morphine 4 MG/ML injection 4 mg (4 mg Intravenous Given 08/01/14 1411)  ondansetron (ZOFRAN) injection 4 mg (4 mg Intravenous Given 08/01/14 1408)  nitroGLYCERIN 50 mg in dextrose 5 % 250 mL (0.2 mg/mL) infusion (20 mcg/min Intravenous Rate/Dose Change 08/01/14 1454)  nitroGLYCERIN 100 MCG/ML intra-arterial injection (not administered)  lidocaine (PF) (XYLOCAINE) 1 % injection (not administered)  heparin 2-0.9 UNIT/ML-% infusion (not administered)  midazolam (VERSED) 2 MG/2ML injection (not administered)  fentaNYL (SUBLIMAZE) 0.05 MG/ML injection (not administered)       Johnna Acosta, MD 08/01/14 [phone]

## 2014-08-01 NOTE — ED Notes (Signed)
Elevated troponin given to dr. Sabra Heck

## 2014-08-01 NOTE — ED Notes (Signed)
Per MD called xray to request STAT

## 2014-08-01 NOTE — H&P (Signed)
Patient ID: Steve Durham MRN: [id number], DOB/AGE: [birth year]   Admit date: 08/01/2014  Primary Physician: No primary provider on file. Primary Cardiologist: C. Angelena Form, MD   Pt. Profile:  68 y/o male with h/o CAD s/p prior CABG and inf MI in 02/2014 with LM stenting who presents to the ED with recurrent chest pain.  Problem List  Past Medical History  Diagnosis Date  . Hypertension   . Hyperlipoproteinemia   . Aortic stenosis     a. Mod AS/AI by cath 07/2012.;  b.  Echo (07/31/13) is: EF 55-60%, mild aortic stenosis (mean gradient 13);  c. Echo (5/15):  EF 40-45%, mild AS (mean 12 mmHg), mod AI  . Back pain   . Carotid artery occlusion     a. Dopplers 07/2012: no significant high grade obstruction.  . Hyperlipidemia   . Abnormal CT scan, kidney     07/2012 - multiple small cysts  . Cholelithiasis     Seen on CT 07/2012  . Coronary artery disease     a. CABG 10/1991. b. cath 07/2012 with prog dsz, turned down for re-do CABG - for med rx for now.; c. NSTEMI/PCI: DES to mLAD ext into IMA graft;  d. Inf STEMI (5/15):  LM 60-70% then 99% before LAD, pLAD occl, pCFX stent 80+% ISR, oRCA 99% then occl (L-R collats to dRCA), L-LAD ok with patent stent, S-CFX occl (old), S-RCA occl (old); PCI: LM ext into CFX with Xience Alpine DES  . Ischemic cardiomyopathy     a. 02/2014 Echo: EF 40-45%, inf and IL hypokinesis, Gr 2 DD, mild AS (mean 12 mmHg), mod AI, mild MR, mod LAE, mildly reduced RVSF, mild TR    Past Surgical History  Procedure Laterality Date  . Cardiac catheterization  04/03/99  . Carpal tunnel release  2007    Excision mass dorsal left wrist  . Hernia repair  1988  . Coronary artery bypass graft  1993  . Fasciotomy Right 07/30/2013    Procedure: OPEN FASCIOTOMY RIGHT RING FINGER & RIGHT SMALL FINGER MULTIPLE LEVELS;  Surgeon: Wynonia Sours, MD;  Location: [facility];  Service: Orthopedics;  Laterality: Right;     Allergies  No Known  Allergies  HPI  68 year old male with the above complex problem list. He is status post coronary artery bypass grafting and has required prior procedures 2-D mid LAD via the left internal mammary artery graft. Most recently, in May 2015, he suffered an inferior ST elevation MI and underwent diagnostic catheterization revealing severe multivessel coronary artery disease with an occluded vein graft to the circumflex, occluded vein graft to the RCA, and patent LIMA to the LAD. He had a 99% distal left main stenosis extending into a previously placed left circumflex stent and this was successfully restented using a drug-eluting stent. Patient says that over the past 2 months, he's been experiencing intermittent rest and exertional substernal chest discomfort which has been nitrate responsive. He has chronic orthopnea and sleeps in a recliner nightly. Last night he says he was fairly restless with a vague chest discomfort. This morning at approximately 8 AM his chest discomfort worsened and became associated with a left shoulder pain and tingling in his left arm and hand. He took nitroglycerin and the chest pain resolved however left shoulder pain persisted. Within about an hour, left chest pain recurred and after several more hours of persistent symptoms, he presented to the ED. Here, he is tachycardic with inferolateral ST depression.  His been treated with IV nitroglycerin also morphine with some resolution of discomfort.  Home Medications  Prior to Admission medications   Medication Sig Start Date End Date Taking? Authorizing Provider  amLODipine-benazepril (LOTREL) 5-20 MG per capsule Take 2 capsules by mouth daily.    Historical Provider, MD  aspirin EC 81 MG tablet Take 81 mg by mouth at bedtime.    Historical Provider, MD  clopidogrel (PLAVIX) 75 MG tablet Take 75 mg by mouth at bedtime.    Historical Provider, MD  ezetimibe-simvastatin (VYTORIN) 10-40 MG per tablet Take 1 tablet by mouth daily.     Historical Provider, MD  fexofenadine (ALLEGRA) 180 MG tablet Take 180 mg by mouth daily.     Historical Provider, MD  furosemide (LASIX) 40 MG tablet Take 40 mg by mouth daily.    Historical Provider, MD  metoprolol (LOPRESSOR) 50 MG tablet Take 50 mg by mouth 2 (two) times daily.    Historical Provider, MD  nitroGLYCERIN (NITROSTAT) 0.4 MG SL tablet Place 0.4 mg under the tongue every 5 (five) minutes as needed for chest pain.    Historical Provider, MD  Omega-3 Fatty Acids (FISH OIL) 1000 MG CAPS Take 1,000 mg by mouth 2 (two) times daily.      Historical Provider, MD  pantoprazole (PROTONIX) 40 MG tablet Take 40 mg by mouth at bedtime.     Historical Provider, MD   Family History  Family History  Problem Relation Age of Onset  . Heart attack Mother     died @ 1.  Marland Kitchen Hypertension Sister   . Emphysema Brother    Social History  History   Social History  . Marital Status: Married    Spouse Name: N/A    Number of Children: 1  . Years of Education: N/A   Occupational History  . Plumbing     Retired in 2007   Social History Main Topics  . Smoking status: Current Every Day Smoker -- 1.00 packs/day for 50 years  . Smokeless tobacco: Not on file     Comment: has cut back   . Alcohol Use: 8.4 oz/week    14 Cans of beer per week     Comment: daily-6 daily  . Drug Use: No  . Sexual Activity: Not on file   Other Topics Concern  . Not on file   Social History Narrative   Lives in [place] with his family.  Does not routinely exercise.    Review of Systems General:  No chills, fever, night sweats or weight changes.  Cardiovascular:  +++ chest pain, +++ dyspnea on exertion, no edema, +++ orthopnea, no palpitations, paroxysmal nocturnal dyspnea. Dermatological: No rash, lesions/masses Respiratory: No cough, +++ dyspnea Urologic: No hematuria, dysuria Abdominal:   No nausea, vomiting, diarrhea, bright red blood per rectum, melena, or hematemesis Neurologic:  No visual changes,  wkns, changes in mental status. All other systems reviewed and are otherwise negative except as noted above.  Physical Exam  Blood pressure 170/83, pulse 117, temperature 98.1 F (36.7 C), temperature source Oral, resp. rate 20, height 5' 5"  (1.651 m), weight 151 lb (68.493 kg), SpO2 98.00%.  General: Pleasant, NAD Psych: Normal affect. Neuro: Alert and oriented X 3. Moves all extremities spontaneously. HEENT: Normal  Neck: Supple.  Radiated murmur bilat. Lungs:  Resp regular and unlabored, CTA. Heart: RRR no s3, s4, 3/6 SEM throughout, loudest @ RUSB with soft diast murmur. Abdomen: Soft, non-tender, non-distended, BS + x 4.  Extremities: No clubbing,  cyanosis or edema. DP/PT/Radials 1+ and equal bilaterally.  Labs  Troponin [facility] Test)  Recent Labs  08/01/14 1403  TROPIPOC 0.08   Lab Results  Component Value Date   WBC 14.0* 08/01/2014   HGB 17.0 08/01/2014   HCT 50.0 08/01/2014   MCV 93.9 08/01/2014   PLT 218 08/01/2014     Recent Labs Lab 08/01/14 1354 08/01/14 1405  NA 141 140  K 3.5* 3.3*  CL 100 103  CO2 22  --   BUN 20 20  CREATININE 1.01 1.00  CALCIUM 9.3  --   GLUCOSE 132* 133*   Lab Results  Component Value Date   CHOL 143 03/01/2014   HDL 64 03/01/2014   LDLCALC 67 03/01/2014   TRIG 61 03/01/2014   Radiology/Studies  No results found.  ECG  ST 113, inflat ST dep (new), LVH.  ASSESSMENT AND PLAN  1.  Acute coronary syndrome/CAD: pt presents today with progressive c/p that has been worsening since last night.  He continues to have cp following  IV ntg and morphine and ECG is notable for sinus tach with inflat ST dep.  He will undergo urgent catheterization today.  Will give lopressor 62m IV now.  He has received heparin bolus.  Cont asa, statin, plavix, bb.  Cardiac rehab post-procedure.  2.  HTN:  BP elevated in ED.  Giving IV lopressor.  Resume home meds and adjust as necessary.  3.  HL:  LDL 67 in May.  Cont statin/zetia.  4.   Tob Abuse:  Cessation advised.  He has no plans to quit.  5.  ETOH Abuse:  Drinks a 6 pack/day -> CIWA protocol.  6.  Ao Stenosis:  Mild by echo in May.  7.  Hypokalemia:  supp.  Signed, CMurray Hodgkins NP 08/01/2014, 2:37 PM  Based upon the patient's symptom severity, he was brought to the cardiac cath lab for Urgent CATH-PCI.  I met him upon arrival to the cath lab - he noted ~5/10 SSCP/Angina - BP ~170/105 with HR ~150 - given IV Lopressor 521mx 1.   S/p recent LM-Cx PCI in 02/2014, LIMA-LAD & this vessel are his only remaining conduits with occluded SVG-RCA & Cx-OM.  I agree with the plan for Urgent cath +/- PCI.   Consent obtained.  Agree with the remainder of the plan.   HALeonie ManM.D., M.S. Interventional Cardiologist   Pager # [phone]

## 2014-08-01 NOTE — ED Notes (Signed)
Pt reports sudden onset of chest pain around 8am.  Pt reports nausea/vomiting, "clamminess", increased SOB over baseline.   Pt took 7 nitro, reports pain imporved.  Pt reports pain returned and took  4 baby ASA.  Pt received 3 more nitro from EMS.

## 2014-08-01 NOTE — CV Procedure (Signed)
CARDIAC CATHETERIZATION AND PERCUTANEOUS CORONARY INTERVENTION REPORT  NAME:  Steve Durham   MRN: [id number] DOB:  October 30, 1945   ADMIT DATE: 08/01/2014 Procedure Date: 08/01/2014  INTERVENTIONAL CARDIOLOGIST: Leonie Man, M.D., MS PRIMARY CARE PROVIDER: No PCP Per Patient PRIMARY CARDIOLOGIST: Lauree Chandler, MD  PATIENT:  Steve Durham is a 68 y.o. male with h/o CAD s/p prior CABG and inf MI in 02/2014 with LM stenting who presents to the ED with recurrent chest pain and diffuse ST depressions concerning for lateral ischemia. He is hypertensive and tachycardic in ER treated with 5 IV Lopressor in the ER and then again upon arrival to the cardiac catheterization lab. He was on her buttock and chest pain upon arrival to the cardiac catheterization lab. He was unable to lie flat due to dyspnea. He was seen by Dr. Fransico Him along with [name], NP-C. Based on the severity of his anginal pain and dyspnea, he is referred for urgent cardiac catheterization for likely acute coronary syndrome.   PRE-OPERATIVE DIAGNOSIS:    Acute Coronary Syndrome  PROCEDURES PERFORMED:    Left Heart Catheterization with Native Coronary and IMA Graft Angiography  via Left Radial Artery   Left Ventriculography - hand injection  Percutaneous Coronary Balloon Angioplasty of Distal Left Main 80% Focal and Proximal Circumflex 50% in-stent restenosis with 3.5 mm post dilation balloon  Percutaneous Coronary Intervention-Stent to proximal obtuse marginal 90% lesion - Xience Alpine DES 3.0 mm x 20 mm (d3.4 mm - p3.6 mm)  PROCEDURE: The patient was brought to the 2nd Floor [facility] in the fasting state and prepped and draped in the usual sterile fashion for Left Radial left artery access. A modified Allen's test was performed on the left wrist demonstrating excellent collateral flow for radial access.   Sterile technique was used including antiseptics, cap, gloves,  gown, hand hygiene, mask and sheet. Skin prep: Chlorhexidine.   Consent: Risks of procedure as well as the alternatives and risks of each were explained to the (patient/caregiver). Consent for procedure obtained.   Time Out: Verified patient identification, verified procedure, site/side was marked, verified correct patient position, special equipment/implants available, medications/allergies/relevent history reviewed, required imaging and test results available. Performed.  Access:   Right Radial Artery: 6 Fr Sheath -  Seldinger Technique (Angiocath Micropuncture Kit)  Radial Cocktail - 10 mL; IV Angiomax Bolus   Left Heart Catheterization: 5Fr Catheters advanced or exchanged over a Long Exchange Safety J-wire; TIG 4.0 catheter advanced first.  Left Coronary Artery Cineangiography: TIG 4.0 Catheter  LIMA-LAD Cineangiography: TIG Catheter redirected into Left Subclavian Artery & exchanged over long-exchange wire for IMA catheter.  LV Hemodynamics (LV Gram): Angled pigtail, hand injection  Sheath removed in the medical physician on a when necessary placement for hemostasis.  TR Band: 1640  Hours; 15 mL air  FINDINGS:  Hemodynamics:   Central Aortic Pressure / Mean: 143/86/107 mmHg  Left Ventricular Pressure / LVEDP: 142/33/45 mmHg  Left Ventriculography: Hand injection due to severely elevated LVEDP of 45 mmHg  EF: Roughly 25%  Wall Motion: Hypokinesis  Coronary Anatomy:  Dominance: Right  Left Main: Long large caliber vessel with a stent for the near ostial location extending into the proximal circumflex. The distal portion of the stent at the takeoff of the LAD which is occluded as a focal 80% hazy in-stent restenosis. LAD: Totally occluded proximally after takeoff. There is a small septal perforator.  LIMA-LAD: Widely patent graft anastomosing to the mid LAD.  There LAD stent distal to the anastomosis is widely patent with brisk TIMI-3 flow down around the apex. There is left  to right collaterals filling the Right Posterior Descending Artery. Left Circumflex: Large caliber vessel with an ostial stent that goes into the large lateral OM. There is diffuse roughly 50% in-stent restenosis in this stent followed by a segmental eccentric 90% stenosis in the proximal portion of the lateral OM with a normal segment of about 18 mm prior to the previously placed distal OM stent.   OM1: Proximal 90% stenosis noted. Distally the vessel trifurcates into 3 major branches. There is extensive collaterals to the right posterior lateral system from this vessel as well as from the AV groove circumflex residual vessel  Vessels/Grafts Not Visualized Due To Non-Inclusion:  RCA: Known 100% occlusion in the mid vessel after 99% ostial stenosis. Not imaged  SVG-Circumflex, and SVG-RCA both known to be occluded and not imaged.  After reviewing the initial angiography, the culprit lesion was thought to be the 90% proximal OM1 lesion as well as the distal LM stent ~80% ISR.  Preparation were made to proceed with PCI on the OM lesion & PTCA of the LM lesion & intervening proximal Circumflex..  Percutaneous Coronary Intervention:  Sheath exchanged for 6 Fr Guide: 6 Fr   CLS Guidewire: BMW  LESION #1 (Culprit): proximal OM1 (after Cx-OM stent) ~95% tubular eccentric - reduced to 0%;   TIMI 3 flow pre & post.  Predilation Balloon: Euphora 2.5 mm x 15 mm;   12 Atm x 30 Sec Stent: Xience Alpine 3.0 mm x 23 mm;   18 Atm x 45 Sec, - distal diameter 3.4 mm  Balloon also used for dLM into stent - 16 Atm x 30 Sec Post-dilation Balloon: Allen Euphora 3.5 mm x 12 mm; ;   Cx-OM - 12 Atm x 30, pLCx 14 Atm x 30 Sec  Final Diameter: 3.6 mm prox stent, 3.4 mm distal stent  200 mcg NTG  LESION #2: Distal LM focal hazy ISR ~80% with proximal OM ~50-60% ISR - reduced to 0% with post dilation  TIMI 3 flow pre & post.  Predilation Balloon: Euphora 2.5 mm x 15 mm;   12 Atm x 30 Sec  Post-dilation  Balloon: [place] Euphora 3.5 mm x 12 mm;   dLM-Ostial Cx 16 Atm x 35 Sec, dLM 16 Atm x 30 Sec  Final Diameter ~3.65 - 3.6 mm from LM into new Stent  Post deployment angiography in multiple views, with and without guidewire in place revealed excellent stent deployment and lesion coverage.  There was no evidence of dissection or perforation.  MEDICATIONS:  Anesthesia:  Local Lidocaine 2 ml  Sedation:  1 mg IV Versed, 50 mcg IV fentanyl ;   Premedication: 5 mg IV Lopressor  Omnipaque Contrast: 145 ml  Anticoagulation:  Angiomax Bolus & drip  Anti-Platelet Agent:  Plavix $Remov'300mg'yVHAxi$  Radial Cocktail: 5 mg Verapamil, 400 mcg NTG, 2 ml 2% Lidocaine in 10 ml NS 250 ml NS Bolus 40 mg IV Lasix  Lopressor $RemoveBe'5mg'ecIDdFgcv$  IV x 2 IC NTG 200 mcg x 1  PATIENT DISPOSITION:    The patient was transferred to the PACU holding area in a hemodynamicaly stable, chest pain free condition.  The patient tolerated the procedure well, and there were no complications.  EBL:   < 5 ml  The patient was stable before, during, and after the procedure.  POST-OPERATIVE DIAGNOSIS:    Severe native CAD as previously described with known  occlusion of the native right coronary artery as well as SVG-RCA, severe left main disease status post stent placement with ISR distally extending into the proximal circumflex eyes are very known LAD occlusion with patent LIMA to LAD and stent distal to the graft insertion. Known occlusion of the SVG-OM with multiple stents in the circumflex and OM.  Successful PCI of the proximal OM1 with an overlapping Xience Alpine DES 3.0 mm x 23 mm that overlaps the proximal circumflex stent. This and the proximal circumflex flexor postdilated to 3.5 mm.  Successful balloon angioplasty of the distal left main in-stent restenosis with a 3.5 mm post dilation balloon with tilting in ~10% residual.  Severely elevated LVEDP with severely reduced Ejection Fraction by hand-injection left Ventriculography  500 mL  urine output after 40 mg IV Lasix in the cath lab  PLAN OF CARE:  The patient will be admitted to the ICU for most catheterization care.  Continue aggressive afterload reduction. We'll continue the IV nitroglycerin drip tonight.  Based on the reduction in LVEF from previously documented, I will order an echocardiogram to confirm. This may simply be because of elevated LVEDP.  Depending on how well he does tomorrow, would consider possibly fast-track discharge.  Continue cycle cardiac enzymes.   Leonie Man, M.D., M.S. Interventional Cardiologist   Pager # [phone]

## 2014-08-01 NOTE — Progress Notes (Signed)
ANTICOAGULATION CONSULT NOTE - Initial Consult  Pharmacy Consult:  Heparin Indication: chest pain/ACS  No Known Allergies  Patient Measurements: Height: 5\' 5"  (165.1 cm) Weight: 151 lb (68.493 kg) IBW/kg (Calculated) : 61.5 Heparin Dosing Weight: 69 kg  Vital Signs: Temp: 98.1 F (36.7 C) (10/15 1354) Temp Source: Oral (10/15 1354) BP: 170/83 mmHg (10/15 1354) Pulse Rate: 117 (10/15 1354)  Labs:  Recent Labs  08/01/14 1354 08/01/14 1405  HGB 15.7 17.0  HCT 46.3 50.0  PLT 218  --   CREATININE  --  1.00    Estimated Creatinine Clearance: 59.8 ml/min (by C-G formula based on Cr of 1).   Medical History: Past Medical History  Diagnosis Date  . Hypertension   . Hyperlipoproteinemia   . Aortic stenosis     a. Mod AS/AI by cath 07/2012.;  b.  Echo (07/31/13) is: EF 55-60%, mild aortic stenosis (mean gradient 13);  c. Echo (5/15):  EF 40-45%, mild AS (mean 12 mmHg), mod AI  . Back pain   . Carotid artery occlusion     a. Dopplers 07/2012: no significant high grade obstruction.  . Hyperlipidemia   . Abnormal CT scan, kidney     07/2012 - multiple small cysts  . Cholelithiasis     Seen on CT 07/2012  . Coronary artery disease     a. CABG 10/1991. b. cath 07/2012 with prog dsz, turned down for re-do CABG - for med rx for now.; c. NSTEMI/PCI: DES to mLAD ext into IMA graft;  d. Inf STEMI (5/15):  LM 60-70% then 99% before LAD, pLAD occl, pCFX stent 80+% ISR, oRCA 99% then occl (L-R collats to dRCA), L-LAD ok with patent stent, S-CFX occl (old), S-RCA occl (old); PCI: LM ext into CFX with Xience Alpine DES  . Ischemic cardiomyopathy     a. 02/2014 Echo: EF 40-45%, inf and IL hypokinesis, Gr 2 DD, mild AS (mean 12 mmHg), mod AI, mild MR, mod LAE, mildly reduced RVSF, mild TR       Assessment: 68 YOM with chest pain to start IV heparin for ACS.  Baseline labs and home meds reviewed.   Goal of Therapy:  Heparin level 0.3-0.7 units/ml Monitor platelets by anticoagulation  protocol: Yes    Plan:  - Heparin 3500 units IV bolus x 1, then - Heparin gtt at 800 units/hr as ordered - Check 8 hr HL - Daily HL / CBC - F/U KCL supplementation    Thyra Yinger D. Mina Marble, PharmD, BCPS Pager:  [phone] 08/01/2014, 2:28 PM

## 2014-08-01 NOTE — Progress Notes (Signed)
Patient seen and personally examined and agree with note as outlined by [name], NP.   Patient has a history of CAD s/p CABG in the past and LM/left circ stenting 02/2014 with known occluded SVG to RCA and SVG to Cx-OM who presents to the ER today with complaints of intermittent CP for the past few weeks.  He has also had SOB.  Today his pain became worse with radiation to his left shoulder and he presented to the ER.  He continues to have CP despite IV Heparin and NTG.  EKG shows more pronounced ST segment depression in the lateral precordial leads.  Given ongoing CP with new EKG changes will take to the cath lab.  Cardiac catheterization was discussed with the patient fully including risks on myocardial infarction, death, stroke, bleeding, arrhythmia, dye allergy, renal insufficiency or bleeding.  All patient questions and concerns were discussed and the patient understands and is willing to proceed.

## 2014-08-02 DIAGNOSIS — I517 Cardiomegaly: Secondary | ICD-10-CM

## 2014-08-02 DIAGNOSIS — I255 Ischemic cardiomyopathy: Secondary | ICD-10-CM

## 2014-08-02 LAB — CBC
HCT: 43.6 % (ref 39.0–52.0)
Hemoglobin: 14.6 g/dL (ref 13.0–17.0)
MCH: 31.9 pg (ref 26.0–34.0)
MCHC: 33.5 g/dL (ref 30.0–36.0)
MCV: 95.4 fL (ref 78.0–100.0)
PLATELETS: 225 10*3/uL (ref 150–400)
RBC: 4.57 MIL/uL (ref 4.22–5.81)
RDW: 12.8 % (ref 11.5–15.5)
WBC: 14.5 10*3/uL — AB (ref 4.0–10.5)

## 2014-08-02 LAB — COMPREHENSIVE METABOLIC PANEL
ALBUMIN: 3.3 g/dL — AB (ref 3.5–5.2)
ALK PHOS: 62 U/L (ref 39–117)
ALT: 20 U/L (ref 0–53)
AST: 71 U/L — AB (ref 0–37)
Anion gap: 13 (ref 5–15)
BUN: 17 mg/dL (ref 6–23)
CALCIUM: 9 mg/dL (ref 8.4–10.5)
CO2: 27 mEq/L (ref 19–32)
Chloride: 100 mEq/L (ref 96–112)
Creatinine, Ser: 0.9 mg/dL (ref 0.50–1.35)
GFR calc Af Amer: >90 mL/min (ref >90)
GFR calc non Af Amer: 84 mL/min — ABNORMAL LOW (ref >90)
Glucose, Bld: 146 mg/dL — ABNORMAL HIGH (ref 70–99)
POTASSIUM: 4.1 meq/L (ref 3.7–5.3)
SODIUM: 140 meq/L (ref 137–147)
TOTAL PROTEIN: 6.7 g/dL (ref 6.0–8.3)
Total Bilirubin: 1.1 mg/dL (ref 0.3–1.2)

## 2014-08-02 LAB — TROPONIN I
Troponin I: 10.06 ng/mL (ref <0.30)
Troponin I: 16.19 ng/mL (ref <0.30)
Troponin I: 16.24 ng/mL (ref <0.30)

## 2014-08-02 MED ORDER — FUROSEMIDE 40 MG PO TABS
40.0000 mg | ORAL_TABLET | Freq: Every day | ORAL | Status: DC
  Administered 2014-08-03: 40 mg via ORAL
  Filled 2014-08-02 (×2): qty 1

## 2014-08-02 MED ORDER — ISOSORBIDE MONONITRATE ER 30 MG PO TB24
30.0000 mg | ORAL_TABLET | Freq: Every day | ORAL | Status: DC
  Administered 2014-08-02: 30 mg via ORAL
  Filled 2014-08-02 (×2): qty 1

## 2014-08-02 MED ORDER — FUROSEMIDE 10 MG/ML IJ SOLN
40.0000 mg | Freq: Once | INTRAMUSCULAR | Status: AC
  Administered 2014-08-02: 40 mg via INTRAVENOUS
  Filled 2014-08-02: qty 4

## 2014-08-02 MED FILL — Sodium Chloride IV Soln 0.9%: INTRAVENOUS | Qty: 50 | Status: AC

## 2014-08-02 NOTE — Care Management Note (Addendum)
    Page 1 of 1   08/06/2014     3:23:00 PM CARE MANAGEMENT NOTE 08/06/2014  Patient:  Steve Durham, Steve Durham   Account Number:  [id number]  Date Initiated:  08/02/2014  Documentation initiated by:  Elissa Hefty  Subjective/Objective Assessment:   adm w chest pain     Action/Plan:   lives w wife   Anticipated DC Date:  08/07/2014   Anticipated DC Plan:  [place]  CM consult      Choice offered to / List presented to:             Status of service:  In process, will continue to follow Medicare Important Message given?  YES (If response is "NO", the following Medicare IM given date fields will be blank) Date Medicare IM given:  08/05/2014 Medicare IM given by:  Dangelo Guzzetta Date Additional Medicare IM given:   Additional Medicare IM given by:    Discharge Disposition:    Per UR Regulation:  Reviewed for med. necessity/level of care/duration of stay  If discussed at [place] of Stay Meetings, dates discussed:   08/06/2014    Comments:  08/05/14 Ellan Lambert, RN, BSN [phone] Pt s/p NSTEMI with PCI on Plavix.  Currently with SOB and CHF on IV Lasix.  Will follow progress.

## 2014-08-02 NOTE — Progress Notes (Signed)
  Echocardiogram 2D Echocardiogram has been performed.  Steve Durham 08/02/2014, 10:48 AM

## 2014-08-02 NOTE — Progress Notes (Signed)
Patient Name: Steve Durham Date of Encounter: 08/02/2014     Active Problems:   Acute coronary syndrome    SUBJECTIVE  Patient had chest pain during the night and his IV NTG had to be increased. Now  Pain free and in no distress.  CURRENT MEDS . amLODipine  10 mg Oral Daily   And  . benazepril  40 mg Oral Daily  . aspirin EC  81 mg Oral Daily  . clopidogrel  75 mg Oral QHS  . ezetimibe-simvastatin  1 tablet Oral q1800  . folic acid  1 mg Oral Daily  . [START ON 08/03/2014] furosemide  40 mg Oral Daily  . loratadine  10 mg Oral Daily  . LORazepam  0-4 mg Oral Q6H   Followed by  . [START ON 08/03/2014] LORazepam  0-4 mg Oral Q12H  . metoprolol  5 mg Intravenous Once  . metoprolol  50 mg Oral BID  . multivitamin with minerals  1 tablet Oral Daily  . omega-3 acid ethyl esters  1 g Oral BID  . pantoprazole  40 mg Oral QHS  . potassium chloride  40 mEq Oral Once  . sodium chloride  3 mL Intravenous Q12H  . sodium chloride  3 mL Intravenous Q12H  . thiamine  100 mg Oral Daily   Or  . thiamine  100 mg Intravenous Daily    OBJECTIVE  Filed Vitals:   08/02/14 0500 08/02/14 0600 08/02/14 0700 08/02/14 1140  BP: 99/53 117/71 126/70 116/61  Pulse: 71 73 73   Temp:   98.2 F (36.8 C)   TempSrc:   Oral   Resp: 14 16 16    Height:      Weight:      SpO2: 94% 92% 95%     Intake/Output Summary (Last 24 hours) at 08/02/14 1211 Last data filed at 08/02/14 1000  Gross per 24 hour  Intake 1451.75 ml  Output   1575 ml  Net -123.25 ml   Filed Weights   08/01/14 1354 08/01/14 1730 08/02/14 0300  Weight: 151 lb (68.493 kg) 147 lb 7.8 oz (66.9 kg) 145 lb 4.5 oz (65.9 kg)    PHYSICAL EXAM  General: Pleasant, NAD. Neuro: Alert and oriented X 3. Moves all extremities spontaneously. Psych: Normal affect. HEENT:  Normal  Neck: Supple without bruits or JVD. Lungs:  Resp regular and unlabored, Mild basilar rales. Heart: RRR no s3, s4.  There is a grade 2/6 systolic  ejection murmur at base. Abdomen: Soft, non-tender, non-distended, BS + x 4.  Extremities: No clubbing, cyanosis or edema. DP/PT/Radials 2+ and equal bilaterally.  Accessory Clinical Findings  CBC  Recent Labs  08/01/14 1354 08/01/14 1405 08/02/14 0240  WBC 14.0*  --  14.5*  HGB 15.7 17.0 14.6  HCT 46.3 50.0 43.6  MCV 93.9  --  95.4  PLT 218  --  921   Basic Metabolic Panel  Recent Labs  08/01/14 1354 08/01/14 1405 08/02/14 0240  NA 141 140 140  K 3.5* 3.3* 4.1  CL 100 103 100  CO2 22  --  27  GLUCOSE 132* 133* 146*  BUN 20 20 17   CREATININE 1.01 1.00 0.90  CALCIUM 9.3  --  9.0   Liver Function Tests  Recent Labs  08/02/14 0240  AST 71*  ALT 20  ALKPHOS 62  BILITOT 1.1  PROT 6.7  ALBUMIN 3.3*   No results found for this basename: LIPASE, AMYLASE,  in the last 72 hours  Cardiac Enzymes  Recent Labs  08/01/14 1928 08/01/14 2335 08/02/14 0240  TROPONINI 2.19* 10.06* 16.19*   BNP No components found with this basename: POCBNP,  D-Dimer No results found for this basename: DDIMER,  in the last 72 hours Hemoglobin A1C No results found for this basename: HGBA1C,  in the last 72 hours Fasting Lipid Panel No results found for this basename: CHOL, HDL, LDLCALC, TRIG, CHOLHDL, LDLDIRECT,  in the last 72 hours Thyroid Function Tests No results found for this basename: TSH, T4TOTAL, FREET3, T3FREE, THYROIDAB,  in the last 72 hours  TELE  NSR  ECG  Normal sinus rhythm Left ventricular hypertrophy with QRS widening and repolarization abnormality Prolonged QT Abnormal ECG  Radiology/Studies  Dg Chest Port 1 View  08/01/2014   CLINICAL DATA:  Mid chest pain and shortness of breath with left arm pain with tingling and numbness in left hand fingers. History of coronary artery disease.  EXAM: PORTABLE CHEST - 1 VIEW  COMPARISON:  03/13/2014  FINDINGS: Heart size and pulmonary vascularity are normal and the lungs are clear. Prior CABG. No acute osseous  abnormality.  IMPRESSION: No acute abnormalities.   Electronically Signed   By: Rozetta Nunnery M.D.   On: 08/01/2014 15:18    ASSESSMENT AND PLAN 1. Acute coronary syndrome with NSTEMI secondary to severe native CAD as previously described with known occlusion of the native right coronary artery as well as SVG-RCA, severe left main disease status post stent placement with ISR distally extending into the proximal circumflex eyes are very known LAD occlusion with patent LIMA to LAD and stent distal to the graft insertion. Known occlusion of the SVG-OM with multiple stents in the circumflex and OM. Successful PCI of the proximal OM1 with an overlapping Xience Alpine DES 3.0 mm x 23 mm that overlaps the proximal circumflex stent. This and the proximal circumflex flexor postdilated to 3.5 mm. Successful balloon angioplasty of the distal left main in-stent restenosis with a 3.5 mm post dilation balloon with tilting in ~10% residual. Severely elevated LVEDP with severely reduced Ejection Fraction by hand-injection left ventriculography  Cont asa, statin, plavix, bb. Cardiac rehab post-procedure. Add imdur. Wean IV NTG   2. HTN: BP elevated in ED. Giving IV lopressor. Resume home meds and adjust as necessary.  3. HL: LDL 67 in May. Cont statin/zetia.  4. Tob Abuse: Cessation advised. He has no plans to quit.  5. ETOH Abuse: Drinks a 6 pack/day -> CIWA protocol.  6. Ao Stenosis: Mild by echo in May. Repeat echo pending. 7. Hypokalemia: supp.   Signed, Darlin Coco MD

## 2014-08-02 NOTE — Progress Notes (Signed)
CARDIAC REHAB PHASE I   PRE:  Rate/Rhythm: 74 SR  BP:  Supine: 106/61  Sitting:   Standing:    SaO2:   MODE:  Ambulation: 700 ft   POST:  Rate/Rhythm: 92 SR  BP:  Supine:   Sitting: 127/63  Standing:    SaO2: 97 RA 1445-1530 Pt tolerated ambulation well without c/o of cp. He was DOE, room air sat after walk 97%. Pt to recliner after walk with call light in reach. Completed MI, stent and CHF education with pt. He lack interest in any education. We discussed smoking cessation. I gave him tips for quitting, coaching contact number and quit smart class information. I strongly encouraged cessation this time. He will not commit to quitting. I gave pt CHF packet. We discussed daily weights, sodium restrictions and walking as tolerated. He states that when he walk much due to him  legs cramping. Pt requested that I not include his wife in on his education. He plans on mowing when he is discharged, I instructed him that he should not. I reviewed MI, risk factors and activity restrictions with him. Pt declines Outpt. CRP, not interested.  Rodney Langton RN 08/02/2014 3:36 PM

## 2014-08-03 ENCOUNTER — Inpatient Hospital Stay (HOSPITAL_COMMUNITY): Payer: Medicare Other

## 2014-08-03 LAB — BASIC METABOLIC PANEL
ANION GAP: 14 (ref 5–15)
BUN: 22 mg/dL (ref 6–23)
CALCIUM: 8.9 mg/dL (ref 8.4–10.5)
CO2: 25 mEq/L (ref 19–32)
Chloride: 99 mEq/L (ref 96–112)
Creatinine, Ser: 0.95 mg/dL (ref 0.50–1.35)
GFR, EST NON AFRICAN AMERICAN: 82 mL/min — AB (ref >90)
Glucose, Bld: 108 mg/dL — ABNORMAL HIGH (ref 70–99)
Potassium: 3.7 mEq/L (ref 3.7–5.3)
SODIUM: 138 meq/L (ref 137–147)

## 2014-08-03 LAB — CBC
HCT: 39 % (ref 39.0–52.0)
Hemoglobin: 13.1 g/dL (ref 13.0–17.0)
MCH: 32 pg (ref 26.0–34.0)
MCHC: 33.6 g/dL (ref 30.0–36.0)
MCV: 95.1 fL (ref 78.0–100.0)
PLATELETS: 192 10*3/uL (ref 150–400)
RBC: 4.1 MIL/uL — ABNORMAL LOW (ref 4.22–5.81)
RDW: 12.7 % (ref 11.5–15.5)
WBC: 12.6 10*3/uL — ABNORMAL HIGH (ref 4.0–10.5)

## 2014-08-03 LAB — TROPONIN I
TROPONIN I: 16.22 ng/mL — AB (ref <0.30)
TROPONIN I: 12.72 ng/mL — AB (ref <0.30)
TROPONIN I: 8.87 ng/mL — AB (ref <0.30)
Troponin I: 8.74 ng/mL (ref <0.30)

## 2014-08-03 LAB — PRO B NATRIURETIC PEPTIDE: Pro B Natriuretic peptide (BNP): 10286 pg/mL — ABNORMAL HIGH (ref 0–125)

## 2014-08-03 MED ORDER — ISOSORBIDE MONONITRATE ER 60 MG PO TB24
60.0000 mg | ORAL_TABLET | Freq: Every day | ORAL | Status: DC
  Administered 2014-08-03 – 2014-08-05 (×3): 60 mg via ORAL
  Filled 2014-08-03 (×3): qty 1

## 2014-08-03 NOTE — Progress Notes (Signed)
CARDIAC REHAB PHASE I   PRE:  Rate/Rhythm: Sinus 62  BP:    Sitting: 104/62    SaO2: 98% Room Air  MODE:  Ambulation:550 ft   POST:  Rate/Rhythem: 78  BP:    Sitting: 104/70     SaO2: 96% Room Air  1430-1445  Patient ambulated in the hallway independently. Patient continues to complained of shortness of breath towards the end of walking. Patient tolerated walk without other complaints. Call bell within reach.  Whitaker, Christa See RN BSN

## 2014-08-03 NOTE — Progress Notes (Signed)
SUBJECTIVE:  Complains of SOB this am along with sharp stabbing pains on the left side of his chest  OBJECTIVE:   Vitals:   Filed Vitals:   08/02/14 1900 08/02/14 2317 08/02/14 2351 08/03/14 0530  BP: 124/65 115/67 135/66 128/65  Pulse: 79 74 76 65  Temp: 98.4 F (36.9 C) 99 F (37.2 C) 98.8 F (37.1 C) 98.2 F (36.8 C)  TempSrc: Oral Oral Oral Oral  Resp: 17 18 18 18   Height:      Weight:    149 lb 14.6 oz (68 kg)  SpO2: 92% 95% 97% 97%   I&O's:   Intake/Output Summary (Last 24 hours) at 08/03/14 0814 Last data filed at 08/02/14 1800  Gross per 24 hour  Intake 1012.5 ml  Output    300 ml  Net  712.5 ml   TELEMETRY: Reviewed telemetry pt in NSR:     PHYSICAL EXAM General: Well developed, well nourished, in no acute distress Head: Eyes PERRLA, No xanthomas.   Normal cephalic and atramatic  Lungs:   Clear bilaterally to auscultation and percussion. Heart:   HRRR S1 S2 Pulses are 2+ & equal. 2/6 SM at RUSB Abdomen: Bowel sounds are positive, abdomen soft and non-tender without masses  Extremities:   No clubbing, cyanosis or edema.  DP +1 Neuro: Alert and oriented X 3. Psych:  Good affect, responds appropriately   LABS: Basic Metabolic Panel:  Recent Labs  08/02/14 0240 08/03/14 0410  NA 140 138  K 4.1 3.7  CL 100 99  CO2 27 25  GLUCOSE 146* 108*  BUN 17 22  CREATININE 0.90 0.95  CALCIUM 9.0 8.9   Liver Function Tests:  Recent Labs  08/02/14 0240  AST 71*  ALT 20  ALKPHOS 62  BILITOT 1.1  PROT 6.7  ALBUMIN 3.3*   No results found for this basename: LIPASE, AMYLASE,  in the last 72 hours CBC:  Recent Labs  08/02/14 0240 08/03/14 0410  WBC 14.5* 12.6*  HGB 14.6 13.1  HCT 43.6 39.0  MCV 95.4 95.1  PLT 225 192   Cardiac Enzymes:  Recent Labs  08/02/14 1845 08/02/14 2328 08/03/14 0410  TROPONINI 16.24* 16.22* 12.72*   BNP: No components found with this basename: POCBNP,  D-Dimer: No results found for this basename: DDIMER,  in  the last 72 hours Hemoglobin A1C: No results found for this basename: HGBA1C,  in the last 72 hours Fasting Lipid Panel: No results found for this basename: CHOL, HDL, LDLCALC, TRIG, CHOLHDL, LDLDIRECT,  in the last 72 hours Thyroid Function Tests: No results found for this basename: TSH, T4TOTAL, FREET3, T3FREE, THYROIDAB,  in the last 72 hours Anemia Panel: No results found for this basename: VITAMINB12, FOLATE, FERRITIN, TIBC, IRON, RETICCTPCT,  in the last 72 hours Coag Panel:   Lab Results  Component Value Date   INR 1.07 08/01/2014   INR 1.22 03/02/2014   INR 1.05 03/01/2014    RADIOLOGY: Dg Chest Port 1 View  08/01/2014   CLINICAL DATA:  Mid chest pain and shortness of breath with left arm pain with tingling and numbness in left hand fingers. History of coronary artery disease.  EXAM: PORTABLE CHEST - 1 VIEW  COMPARISON:  03/13/2014  FINDINGS: Heart size and pulmonary vascularity are normal and the lungs are clear. Prior CABG. No acute osseous abnormality.  IMPRESSION: No acute abnormalities.   Electronically Signed   By: Rozetta Nunnery M.D.   On: 08/01/2014 15:18   ASSESSMENT AND  PLAN  1. Acute coronary syndrome with NSTEMI secondary to severe native CAD as previously described with known occlusion of the native right coronary artery as well as SVG-RCA, severe left main disease status post stent placement with ISR distally extending into the proximal circumflex eyes are very known LAD occlusion with patent LIMA to LAD and stent distal to the graft insertion. Known occlusion of the SVG-OM with multiple stents in the circumflex and OM. Successful PCI of the proximal OM1 with an overlapping Xience Alpine DES 3.0 mm x 23 mm that overlaps the proximal circumflex stent. This and the proximal circumflex flexor postdilated to 3.5 mm. Successful balloon angioplasty of the distal left main in-stent restenosis with a 3.5 mm post dilation balloon with tilting in ~10% residual. Severely elevated LVEDP  with severely reduced Ejection Fraction by hand-injection left ventriculography. By 2D echo EF 45%.   He continues to complain of SOB and is SOB talking this am although he has no LE edema on exam and lungs are clear.  Cont asa, statin, plavix, bb, Imdur. Cardiac rehab post-procedure.   2. HTN: Controlled on BB, amlodipine/ACE I 3. HL: LDL 67 in May. Cont statin/zetia.  4. Tob Abuse: Cessation advised. He has no plans to quit.  5. ETOH Abuse: Drinks a 6 pack/day -> CIWA protocol.  6. Ao Stenosis: Mild by echo 7. Hypokalemia: repleted 8. SOB - ? Secondary to underlying COPD and ongoing tobacco use vs. Angina.  Will increase Imdur to 60mg  daily.  Check BNP.  Check Chest xray.   Sueanne Margarita, MD  08/03/2014  8:14 AM

## 2014-08-04 DIAGNOSIS — I5021 Acute systolic (congestive) heart failure: Secondary | ICD-10-CM

## 2014-08-04 DIAGNOSIS — I5022 Chronic systolic (congestive) heart failure: Secondary | ICD-10-CM | POA: Diagnosis not present

## 2014-08-04 LAB — BASIC METABOLIC PANEL
Anion gap: 11 (ref 5–15)
BUN: 17 mg/dL (ref 6–23)
CHLORIDE: 99 meq/L (ref 96–112)
CO2: 27 meq/L (ref 19–32)
Calcium: 9 mg/dL (ref 8.4–10.5)
Creatinine, Ser: 1 mg/dL (ref 0.50–1.35)
GFR calc Af Amer: 86 mL/min — ABNORMAL LOW (ref >90)
GFR, EST NON AFRICAN AMERICAN: 74 mL/min — AB (ref >90)
GLUCOSE: 144 mg/dL — AB (ref 70–99)
POTASSIUM: 3.7 meq/L (ref 3.7–5.3)
Sodium: 137 mEq/L (ref 137–147)

## 2014-08-04 LAB — CBC
HCT: 39.2 % (ref 39.0–52.0)
Hemoglobin: 12.9 g/dL — ABNORMAL LOW (ref 13.0–17.0)
MCH: 31.5 pg (ref 26.0–34.0)
MCHC: 32.9 g/dL (ref 30.0–36.0)
MCV: 95.8 fL (ref 78.0–100.0)
Platelets: 185 10*3/uL (ref 150–400)
RBC: 4.09 MIL/uL — ABNORMAL LOW (ref 4.22–5.81)
RDW: 12.7 % (ref 11.5–15.5)
WBC: 10.7 10*3/uL — AB (ref 4.0–10.5)

## 2014-08-04 LAB — TROPONIN I
Troponin I: 5.57 ng/mL (ref <0.30)
Troponin I: 6.69 ng/mL (ref <0.30)
Troponin I: 8.02 ng/mL (ref <0.30)

## 2014-08-04 MED ORDER — FUROSEMIDE 10 MG/ML IJ SOLN
40.0000 mg | Freq: Two times a day (BID) | INTRAMUSCULAR | Status: DC
  Administered 2014-08-04 – 2014-08-05 (×3): 40 mg via INTRAVENOUS
  Filled 2014-08-04 (×5): qty 4

## 2014-08-04 MED ORDER — POTASSIUM CHLORIDE CRYS ER 20 MEQ PO TBCR
20.0000 meq | EXTENDED_RELEASE_TABLET | Freq: Every day | ORAL | Status: DC
  Administered 2014-08-04 – 2014-08-07 (×4): 20 meq via ORAL
  Filled 2014-08-04 (×4): qty 1

## 2014-08-04 NOTE — Progress Notes (Signed)
SUBJECTIVE:  Breathing much improved this am.  Denies any chest pain  OBJECTIVE:   Vitals:   Filed Vitals:   08/03/14 0530 08/03/14 1030 08/03/14 2056 08/04/14 0553  BP: 128/65 115/62 136/66 129/75  Pulse: 65 68 71 64  Temp: 98.2 F (36.8 C)  98.3 F (36.8 C) 98.6 F (37 C)  TempSrc: Oral  Oral Oral  Resp: 18  18 16   Height:      Weight: 149 lb 14.6 oz (68 kg)   148 lb 11.2 oz (67.45 kg)  SpO2: 97%  97% 95%   I&O's:   Intake/Output Summary (Last 24 hours) at 08/04/14 0658 Last data filed at 08/03/14 2201  Gross per 24 hour  Intake    480 ml  Output      0 ml  Net    480 ml   TELEMETRY: Reviewed telemetry pt in NSR:     PHYSICAL EXAM General: Well developed, well nourished, in no acute distress Head: Eyes PERRLA, No xanthomas.   Normal cephalic and atramatic  Lungs:   Clear bilaterally to auscultation and percussion. Heart:   HRRR S1 S2 Pulses are 2+ & equal. Abdomen: Bowel sounds are positive, abdomen soft and non-tender without masses  Extremities:   No clubbing, cyanosis or edema.  DP +1 Neuro: Alert and oriented X 3. Psych:  Good affect, responds appropriately   LABS: Basic Metabolic Panel:  Recent Labs  08/02/14 0240 08/03/14 0410  NA 140 138  K 4.1 3.7  CL 100 99  CO2 27 25  GLUCOSE 146* 108*  BUN 17 22  CREATININE 0.90 0.95  CALCIUM 9.0 8.9   Liver Function Tests:  Recent Labs  08/02/14 0240  AST 71*  ALT 20  ALKPHOS 62  BILITOT 1.1  PROT 6.7  ALBUMIN 3.3*   No results found for this basename: LIPASE, AMYLASE,  in the last 72 hours CBC:  Recent Labs  08/03/14 0410 08/04/14 0530  WBC 12.6* 10.7*  HGB 13.1 12.9*  HCT 39.0 39.2  MCV 95.1 95.8  PLT 192 185   Cardiac Enzymes:  Recent Labs  08/03/14 1852 08/03/14 2345 08/04/14 0530  TROPONINI 8.74* 8.02* 6.69*   BNP: No components found with this basename: POCBNP,  D-Dimer: No results found for this basename: DDIMER,  in the last 72 hours Hemoglobin A1C: No results  found for this basename: HGBA1C,  in the last 72 hours Fasting Lipid Panel: No results found for this basename: CHOL, HDL, LDLCALC, TRIG, CHOLHDL, LDLDIRECT,  in the last 72 hours Thyroid Function Tests: No results found for this basename: TSH, T4TOTAL, FREET3, T3FREE, THYROIDAB,  in the last 72 hours Anemia Panel: No results found for this basename: VITAMINB12, FOLATE, FERRITIN, TIBC, IRON, RETICCTPCT,  in the last 72 hours Coag Panel:   Lab Results  Component Value Date   INR 1.07 08/01/2014   INR 1.22 03/02/2014   INR 1.05 03/01/2014    RADIOLOGY: Dg Chest 2 View  08/03/2014   CLINICAL DATA:  Shortness of breath.  EXAM: CHEST  2 VIEW  COMPARISON:  Single view of the chest 08/01/2014 and 08/02/2013. CT chest 07/31/2013.  FINDINGS: The patient is status post CABG. Heart size is normal. Lungs are clear. No pneumothorax or pleural effusion. No focal bony abnormality.  IMPRESSION: No acute cardiopulmonary disease.   Electronically Signed   By: Inge Rise M.D.   On: 08/03/2014 14:02   Dg Chest Port 1 View  08/01/2014   CLINICAL DATA:  Mid chest pain and shortness of breath with left arm pain with tingling and numbness in left hand fingers. History of coronary artery disease.  EXAM: PORTABLE CHEST - 1 VIEW  COMPARISON:  03/13/2014  FINDINGS: Heart size and pulmonary vascularity are normal and the lungs are clear. Prior CABG. No acute osseous abnormality.  IMPRESSION: No acute abnormalities.   Electronically Signed   By: Rozetta Nunnery M.D.   On: 08/01/2014 15:18   ASSESSMENT AND PLAN  1. Acute coronary syndrome with NSTEMI secondary to severe native CAD as previously described with known occlusion of the native right coronary artery as well as SVG-RCA, severe left main disease status post stent placement with ISR distally extending into the proximal circumflex eyes are very known LAD occlusion with patent LIMA to LAD and stent distal to the graft insertion. Known occlusion of the SVG-OM with  multiple stents in the circumflex and OM. Successful PCI of the proximal OM1 with an overlapping Xience Alpine DES 3.0 mm x 23 mm that overlaps the proximal circumflex stent. This and the proximal circumflex flexor postdilated to 3.5 mm. Successful balloon angioplasty of the distal left main in-stent restenosis with a 3.5 mm post dilation balloon with tilting in ~10% residual. Severely elevated LVEDP with severely reduced Ejection Fraction by hand-injection left ventriculography. By 2D echo EF 45%.  Cont asa, statin, plavix, bb, Imdur. Cardiac rehab post-procedure.  2. HTN: Controlled on BB, amlodipine/ACE I  3. HL: LDL 67 in May. Cont statin/zetia.  4. Tob Abuse: Cessation advised. He has no plans to quit.  5. ETOH Abuse: Drinks a 6 pack/day -> CIWA protocol.  6. Ao Stenosis: Mild by echo  7. Hypokalemia: repleted  8. Acute systolic CHF with significantly elevated BNP.  I&O's are positive despite PO lasix - continue BB and ACE I.  Change PO Lasix to IV 40mg  BID today and reassess in am. Add Kdur.  BMET in am.  Hopefully can d/c home tomorrow am.   Steve Margarita, MD  08/04/2014  6:58 AM

## 2014-08-05 ENCOUNTER — Encounter (HOSPITAL_COMMUNITY): Payer: Self-pay | Admitting: Physician Assistant

## 2014-08-05 DIAGNOSIS — I249 Acute ischemic heart disease, unspecified: Secondary | ICD-10-CM

## 2014-08-05 DIAGNOSIS — I25110 Atherosclerotic heart disease of native coronary artery with unstable angina pectoris: Secondary | ICD-10-CM

## 2014-08-05 DIAGNOSIS — Z720 Tobacco use: Secondary | ICD-10-CM

## 2014-08-05 DIAGNOSIS — I350 Nonrheumatic aortic (valve) stenosis: Secondary | ICD-10-CM

## 2014-08-05 DIAGNOSIS — I214 Non-ST elevation (NSTEMI) myocardial infarction: Principal | ICD-10-CM

## 2014-08-05 LAB — CBC
HCT: 40.9 % (ref 39.0–52.0)
HEMOGLOBIN: 13.4 g/dL (ref 13.0–17.0)
MCH: 31.9 pg (ref 26.0–34.0)
MCHC: 32.8 g/dL (ref 30.0–36.0)
MCV: 97.4 fL (ref 78.0–100.0)
Platelets: 215 10*3/uL (ref 150–400)
RBC: 4.2 MIL/uL — AB (ref 4.22–5.81)
RDW: 12.7 % (ref 11.5–15.5)
WBC: 11.1 10*3/uL — ABNORMAL HIGH (ref 4.0–10.5)

## 2014-08-05 LAB — BASIC METABOLIC PANEL
Anion gap: 14 (ref 5–15)
BUN: 19 mg/dL (ref 6–23)
CALCIUM: 8.8 mg/dL (ref 8.4–10.5)
CHLORIDE: 99 meq/L (ref 96–112)
CO2: 25 meq/L (ref 19–32)
CREATININE: 1.09 mg/dL (ref 0.50–1.35)
GFR calc Af Amer: 77 mL/min — ABNORMAL LOW (ref >90)
GFR calc non Af Amer: 67 mL/min — ABNORMAL LOW (ref >90)
GLUCOSE: 94 mg/dL (ref 70–99)
Potassium: 3.8 mEq/L (ref 3.7–5.3)
Sodium: 138 mEq/L (ref 137–147)

## 2014-08-05 LAB — TROPONIN I
TROPONIN I: 3.4 ng/mL — AB (ref <0.30)
TROPONIN I: 5.75 ng/mL — AB (ref <0.30)
Troponin I: 4.97 ng/mL (ref <0.30)
Troponin I: 4.21 ng/mL (ref <0.30)

## 2014-08-05 MED ORDER — ISOSORBIDE MONONITRATE ER 60 MG PO TB24
90.0000 mg | ORAL_TABLET | Freq: Every day | ORAL | Status: DC
  Administered 2014-08-06 – 2014-08-07 (×2): 90 mg via ORAL
  Filled 2014-08-05 (×2): qty 1

## 2014-08-05 MED ORDER — FUROSEMIDE 40 MG PO TABS
60.0000 mg | ORAL_TABLET | Freq: Two times a day (BID) | ORAL | Status: DC
  Administered 2014-08-05 – 2014-08-07 (×4): 60 mg via ORAL
  Filled 2014-08-05 (×6): qty 1

## 2014-08-05 NOTE — Progress Notes (Addendum)
Patient Name: Ulrich Soules Service Date of Encounter: 08/05/2014     Active Problems:   Acute coronary syndrome   Acute systolic heart failure    SUBJECTIVE  Wanting to go home. Still SOB, but he states that this is chronic.   CURRENT MEDS . amLODipine  10 mg Oral Daily   And  . benazepril  40 mg Oral Daily  . aspirin EC  81 mg Oral Daily  . clopidogrel  75 mg Oral QHS  . ezetimibe-simvastatin  1 tablet Oral q1800  . folic acid  1 mg Oral Daily  . furosemide  40 mg Intravenous BID  . isosorbide mononitrate  60 mg Oral Daily  . loratadine  10 mg Oral Daily  . LORazepam  0-4 mg Oral Q12H  . metoprolol  5 mg Intravenous Once  . metoprolol  50 mg Oral BID  . multivitamin with minerals  1 tablet Oral Daily  . omega-3 acid ethyl esters  1 g Oral BID  . pantoprazole  40 mg Oral QHS  . potassium chloride  20 mEq Oral Daily  . potassium chloride  40 mEq Oral Once  . sodium chloride  3 mL Intravenous Q12H  . sodium chloride  3 mL Intravenous Q12H  . thiamine  100 mg Oral Daily   Or  . thiamine  100 mg Intravenous Daily    OBJECTIVE  Filed Vitals:   08/04/14 1024 08/04/14 1938 08/05/14 0448 08/05/14 0830  BP: 130/72 137/65 130/72 116/60  Pulse: 78 72 57 62  Temp:  98.8 F (37.1 C) 98.5 F (36.9 C) 97.9 F (36.6 C)  TempSrc:  Oral Oral Oral  Resp:  _0 Height:      Weight:   149 lb 7.6 oz (67.8 kg)   SpO2:  97% 99% 98%    Intake/Output Summary (Last 24 hours) at 08/05/14 1012 Last data filed at 08/04/14 1700  Gross per 24 hour  Intake    240 ml  Output      0 ml  Net    240 ml   Filed Weights   08/03/14 0530 08/04/14 0553 08/05/14 0448  Weight: 149 lb 14.6 oz (68 kg) 148 lb 11.2 oz (67.45 kg) 149 lb 7.6 oz (67.8 kg)    PHYSICAL EXAM  General: Pleasant, NAD. Neuro: Alert and oriented X 3. Moves all extremities spontaneously. Psych: Normal affect. HEENT:  Normal  Neck: Supple without bruits or JVD. Lungs:  Resp regular and unlabored, CTA. Heart:  RRR no s3, s4, or + murmur. Abdomen: Soft, non-tender, non-distended, BS + x 4.  Extremities: No clubbing, cyanosis or edema. DP/PT/Radials 2+ and equal bilaterally.  Accessory Clinical Findings  CBC  Recent Labs  08/04/14 0530 08/05/14 0515  WBC 10.7* 11.1*  HGB 12.9* 13.4  HCT 39.2 40.9  MCV 95.8 97.4  PLT 185 740   Basic Metabolic Panel  Recent Labs  08/04/14 0820 08/05/14 0515  NA 137 138  K 3.7 3.8  CL 99 99  CO2 27 25  GLUCOSE 144* 94  BUN 17 19  CREATININE 1.00 1.09  CALCIUM 9.0 8.8   Cardiac Enzymes  Recent Labs  08/04/14 1715 08/04/14 2350 08/05/14 0515  TROPONINI 5.57* 5.75* 4.97*   BMET    Component Value Date/Time   NA 138 08/05/2014 0515   K 3.8 08/05/2014 0515   CL 99 08/05/2014 0515   CO2 25 08/05/2014 0515   GLUCOSE 94 08/05/2014 0515   BUN 19 08/05/2014  0515   CREATININE 1.09 08/05/2014 0515   CALCIUM 8.8 08/05/2014 0515   GFRNONAA 67* 08/05/2014 0515   GFRAA 77* 08/05/2014 0515   BNP (last 3 results)  Recent Labs  03/13/14 1445 08/03/14 1134  PROBNP 1311.0* 10286.0*    TELE  NSR  Radiology/Studies  Dg Chest 2 View  08/03/2014   CLINICAL DATA:  Shortness of breath.  EXAM: CHEST  2 VIEW  COMPARISON:  Single view of the chest 08/01/2014 and 08/02/2013. CT chest 07/31/2013.  FINDINGS: The patient is status post CABG. Heart size is normal. Lungs are clear. No pneumothorax or pleural effusion. No focal bony abnormality.  IMPRESSION: No acute cardiopulmonary disease.   Electronically Signed   By: Inge Rise M.D.   On: 08/03/2014 14:02   Dg Chest Port 1 View  08/01/2014   CLINICAL DATA:  Mid chest pain and shortness of breath with left arm pain with tingling and numbness in left hand fingers. History of coronary artery disease.  EXAM: PORTABLE CHEST - 1 VIEW  COMPARISON:  03/13/2014  FINDINGS: Heart size and pulmonary vascularity are normal and the lungs are clear. Prior CABG. No acute osseous abnormality.  IMPRESSION: No  acute abnormalities.   Electronically Signed   By: Rozetta Nunnery M.D.   On: 08/01/2014 15:18    CARDIAC CATHETERIZATION AND PERCUTANEOUS CORONARY INTERVENTION REPORT  NAME: Trace Wirick Wanninger MRN: [id number]  DOB: 1946-04-05 ADMIT DATE: 08/01/2014  Procedure Date: 08/01/2014  INTERVENTIONAL CARDIOLOGIST: Leonie Man, M.D., MS  PRIMARY CARE PROVIDER: No PCP Per Patient  PRIMARY CARDIOLOGIST: Lauree Chandler, MD  PATIENT: Tu Bayle Laye is a 68 y.o. male with h/o CAD s/p prior CABG and inf MI in 02/2014 with LM stenting who presents to the ED with recurrent chest pain and diffuse ST depressions concerning for lateral ischemia. He is hypertensive and tachycardic in ER treated with 5 IV Lopressor in the ER and then again upon arrival to the cardiac catheterization lab. He was on her buttock and chest pain upon arrival to the cardiac catheterization lab. He was unable to lie flat due to dyspnea.  He was seen by Dr. Fransico Him along with [name], NP-C.  Based on the severity of his anginal pain and dyspnea, he is referred for urgent cardiac catheterization for likely acute coronary syndrome.  PRE-OPERATIVE DIAGNOSIS:  Acute Coronary Syndrome PROCEDURES PERFORMED:  Left Heart Catheterization with Native Coronary and IMA Graft Angiography via Left Radial Artery  Left Ventriculography - hand injection Percutaneous Coronary Balloon Angioplasty of Distal Left Main 80% Focal and Proximal Circumflex 50% in-stent restenosis with 3.5 mm post dilation balloon  Percutaneous Coronary Intervention-Stent to proximal obtuse marginal 90% lesion - Xience Alpine DES 3.0 mm x 20 mm (d3.4 mm - p3.6 mm) PROCEDURE: The patient was brought to the 2nd Floor [facility] in the fasting state and prepped and draped in the usual sterile fashion for Left Radial left artery access. A modified Allen's test was performed on the left wrist demonstrating excellent collateral flow for radial  access. Sterile technique was used including antiseptics, cap, gloves, gown, hand hygiene, mask and sheet. Skin prep: Chlorhexidine.  Consent: Risks of procedure as well as the alternatives and risks of each were explained to the (patient/caregiver). Consent for procedure obtained.  Time Out: Verified patient identification, verified procedure, site/side was marked, verified correct patient position, special equipment/implants available, medications/allergies/relevent history reviewed, required imaging and test results available. Performed.  Access:  Right Radial Artery: 6  Fr Sheath - Seldinger Technique (Angiocath Micropuncture Kit)  Radial Cocktail - 10 mL; IV Angiomax Bolus  Left Heart Catheterization: 5Fr Catheters advanced or exchanged over a Long Exchange Safety J-wire; TIG 4.0 catheter advanced first.  Left Coronary Artery Cineangiography: TIG 4.0 Catheter  LIMA-LAD Cineangiography: TIG Catheter redirected into Left Subclavian Artery & exchanged over long-exchange wire for IMA catheter. LV Hemodynamics (LV Gram): Angled pigtail, hand injection Sheath removed in the medical physician on a when necessary placement for hemostasis.  TR Band: 1640 Hours; 15 mL air  FINDINGS:  Hemodynamics:  Central Aortic Pressure / Mean: 143/86/107 mmHg  Left Ventricular Pressure / LVEDP: 142/33/45 mmHg Left Ventriculography: Hand injection due to severely elevated LVEDP of 45 mmHg  EF: Roughly 25%  Wall Motion: Hypokinesis Coronary Anatomy:  Dominance: Right Left Main: Long large caliber vessel with a stent for the near ostial location extending into the proximal circumflex. The distal portion of the stent at the takeoff of the LAD which is occluded as a focal 80% hazy in-stent restenosis. LAD: Totally occluded proximally after takeoff. There is a small septal perforator.  LIMA-LAD: Widely patent graft anastomosing to the mid LAD. There LAD stent distal to the anastomosis is widely patent with brisk TIMI-3  flow down around the apex. There is left to right collaterals filling the Right Posterior Descending Artery. Left Circumflex: Large caliber vessel with an ostial stent that goes into the large lateral OM. There is diffuse roughly 50% in-stent restenosis in this stent followed by a segmental eccentric 90% stenosis in the proximal portion of the lateral OM with a normal segment of about 18 mm prior to the previously placed distal OM stent.  OM1: Proximal 90% stenosis noted. Distally the vessel trifurcates into 3 major branches. There is extensive collaterals to the right posterior lateral system from this vessel as well as from the AV groove circumflex residual vessel Vessels/Grafts Not Visualized Due To Non-Inclusion:  RCA: Known 100% occlusion in the mid vessel after 99% ostial stenosis. Not imaged  SVG-Circumflex, and SVG-RCA both known to be occluded and not imaged. After reviewing the initial angiography, the culprit lesion was thought to be the 90% proximal OM1 lesion as well as the distal LM stent ~80% ISR. Preparation were made to proceed with PCI on the OM lesion & PTCA of the LM lesion & intervening proximal Circumflex..  Percutaneous Coronary Intervention: Sheath exchanged for 6 Fr  Guide: 6 Fr CLS Guidewire: BMW  LESION #1 (Culprit): proximal OM1 (after Cx-OM stent) ~95% tubular eccentric - reduced to 0%;  TIMI 3 flow pre & post. Predilation Balloon: Euphora 2.5 mm x 15 mm;  12 Atm x 30 Sec Stent: Xience Alpine 3.0 mm x 23 mm;  18 Atm x 45 Sec, - distal diameter 3.4 mm  Balloon also used for dLM into stent - 16 Atm x 30 Sec Post-dilation Balloon: Latty Euphora 3.5 mm x 12 mm; ;  Cx-OM - 12 Atm x 30, pLCx 14 Atm x 30 Sec  Final Diameter: 3.6 mm prox stent, 3.4 mm distal stent  200 mcg NTG LESION #2: Distal LM focal hazy ISR ~80% with proximal OM ~50-60% ISR - reduced to 0% with post dilation  TIMI 3 flow pre & post. Predilation Balloon: Euphora 2.5 mm x 15 mm;  12 Atm x 30  Sec Post-dilation Balloon: [place] Euphora 3.5 mm x 12 mm;  dLM-Ostial Cx 16 Atm x 35 Sec, dLM 16 Atm x 30 Sec  Final Diameter ~3.65 - 3.6  mm from LM into new Stent Post deployment angiography in multiple views, with and without guidewire in place revealed excellent stent deployment and lesion coverage. There was no evidence of dissection or perforation.  MEDICATIONS:  Anesthesia: Local Lidocaine 2 ml Sedation: 1 mg IV Versed, 50 mcg IV fentanyl ;  Premedication: 5 mg IV Lopressor Omnipaque Contrast: 145 ml  Anticoagulation: Angiomax Bolus & drip  Anti-Platelet Agent: Plavix 393m Radial Cocktail: 5 mg Verapamil, 400 mcg NTG, 2 ml 2% Lidocaine in 10 ml NS  250 ml NS Bolus  40 mg IV Lasix  Lopressor 519mIV x 2  IC NTG 200 mcg x 1 PATIENT DISPOSITION:  The patient was transferred to the PACU holding area in a hemodynamicaly stable, chest pain free condition.  The patient tolerated the procedure well, and there were no complications. EBL: < 5 ml  The patient was stable before, during, and after the procedure. POST-OPERATIVE DIAGNOSIS:  Severe native CAD as previously described with known occlusion of the native right coronary artery as well as SVG-RCA, severe left main disease status post stent placement with ISR distally extending into the proximal circumflex eyes are very known LAD occlusion with patent LIMA to LAD and stent distal to the graft insertion. Known occlusion of the SVG-OM with multiple stents in the circumflex and OM.  Successful PCI of the proximal OM1 with an overlapping Xience Alpine DES 3.0 mm x 23 mm that overlaps the proximal circumflex stent. This and the proximal circumflex flexor postdilated to 3.5 mm.  Successful balloon angioplasty of the distal left main in-stent restenosis with a 3.5 mm post dilation balloon with tilting in ~10% residual.  Severely elevated LVEDP with severely reduced Ejection Fraction by hand-injection left Ventriculography  500 mL urine output after 40  mg IV Lasix in the cath lab PLAN OF CARE:  The patient will be admitted to the ICU for most catheterization care.  Continue aggressive afterload reduction. We'll continue the IV nitroglycerin drip tonight.  Based on the reduction in LVEF from previously documented, I will order an echocardiogram to confirm. This may simply be because of elevated LVEDP.  Depending on how well he does tomorrow, would consider possibly fast-track discharge.  Continue cycle cardiac enzymes.    2D ECHO: 08/02/2014 Study Conclusions - Left ventricle: Severe hypokinesis base/mid-inferolateral segments and severe hypokinesis base inferior segment. EF is 45%. The cavity size was mildly dilated. Wall thickness was increased in a pattern of mild LVH. - Aortic valve: Leaflets are calcified. Doppler suggests mild AS. The AS may be underestimated because of the LV dysfunction. Mean gradient (S): 14 mm Hg. Peak gradient (S): 25 mm Hg. VTI ratio of LVOT to aortic valve: 0.37. - Left atrium: The atrium was moderately dilated. - Right ventricle: The cavity size was normal. Systolic function was mildly reduced.     ASSESSMENT AND PLAN  [age]/o male with h/o tobacco abuse, ETOH abuse, HTN, HLD, CAD s/p prior CABG; inf MI in 02/2014 with LM stenting and ischemic CM [facility] pain and was taken back for urgent cardiac cath.  Acute coronary syndrome with NSTEMI secondary to severe native CAD as previously described with known occlusion of the native right coronary artery as well as SVG-RCA, severe left main disease s/p stent placement with ISR distally extending into the proximal circumflex eyes are very known LAD occlusion with patent LIMA to LAD and stent distal to the graft insertion. Known occlusion of the  SVG-OM with multiple stents in the circumflex and OM. Successful PCI of the proximal OM1 with an overlapping DES to prox circumflex stent. Successful balloon angioplasty of the  distal left main in-stent restenosis with a 3.5 mm post dilation balloon with tilting in ~10% residual. Severely elevated LVEDP with severely reduced Ejection Fraction by hand-injection left ventriculography. -- Pk troponin 16.22. -- By 2D echo EF 45%.  -- Cont asa, statin, plavix, bb, Imdur.  -- Cardiac rehab post-procedure.   HTN: well controlled on metoprolol 86m BID, benazepril 4110mqd and amlodipine 1054m HL: LDL 67 in May. Cont statin/zetia.   Tob Abuse: Cessation advised. He has some patches at home. I offered to call some in but he stated they were cheaper OTC.  ETOH Abuse: Drinks a 6 pack/day -> CIWA protocol.   Ao Stenosis:  2D ECHO on 1018/15 w/ mild AS (may be underestimated due to LV dysfunction).  Mean gradient (S): 14 mm Hg. Peak gradient (S): 25 mm Hg. VTI ratio of LVOT to aortic valve: 0.37.   Hypokalemia: repleted   Acute systolic CHF/Ischemic CM with significantly elevated BNP (10.2K)  -- 2D ECHO: 08/02/2014; EF 45%; severe hypokinesis base/mid-inferolat segments and severe hypokinesis base inferior segment. Mod LA dilation. Mild RV systolic dysfunction.  -- No diuresis on PO Lasix so switched to IV Lasix 28m33mD yesterday; however, I/Os still positive and weight up. Patient states that they have not been monitoring his output well but he has been urinating quite a bit.  -- Will need maintenance dose of Lasix  -- Continue metoprolol 50mg74m and benazepril 28mg 35mSignedJudy Pimple Pager [phone] seen and examined. Agree with assessment and plan. I/O since admission +1708; BNP > 10,000 2 days ago without significant diuresis although pt states that his output has not been completely recorded. Not ready for DC. Sinus rhythm in the 60's. Will transition to oral lasix at 60 mg am, 40 mg in afternoon; increase imdur to 90 mg for improved preload reduction. F/u BNP, BMet in am. Discussed smoking cessation.   ThomasTroy Sine FACC [facility]/2015 10:55 AM

## 2014-08-05 NOTE — Progress Notes (Signed)
CARDIAC REHAB PHASE I   PRE:  Rate/Rhythm: 62 SR  BP:  Supine: 11660  Sitting:   Standing:    SaO2: 98 RA  MODE:  Ambulation: 890 ft   POST:  Rate/Rhythm: 82  BP:  Supine:   Sitting: 148/70  Standing:    SaO2: 97 RA 0825-0850 Pt tolerated ambulation well without c/o of cp. He states that he has not been able to sleep at night here or at home due to SOB waking him at night. He states that he has been having to sleep a lot sitting in his recliner due to this. Pt states that he wakes up gasping for air. He has been feeling this the last few months, but has not discussed with MD. I encouraged him to talk with his MD about his symptoms. I strongly again have encouraged smoking cessation. He denies any questions related to the educational information that I have provided him.  Rodney Langton RN 08/05/2014 8:48 AM

## 2014-08-06 LAB — TROPONIN I
TROPONIN I: 3.28 ng/mL — AB (ref <0.30)
TROPONIN I: 2.73 ng/mL — AB (ref <0.30)
Troponin I: 2.67 ng/mL (ref <0.30)
Troponin I: 2.47 ng/mL (ref <0.30)

## 2014-08-06 LAB — CBC
HEMATOCRIT: 39.7 % (ref 39.0–52.0)
HEMOGLOBIN: 12.9 g/dL — AB (ref 13.0–17.0)
MCH: 31.2 pg (ref 26.0–34.0)
MCHC: 32.5 g/dL (ref 30.0–36.0)
MCV: 96.1 fL (ref 78.0–100.0)
Platelets: 235 10*3/uL (ref 150–400)
RBC: 4.13 MIL/uL — ABNORMAL LOW (ref 4.22–5.81)
RDW: 12.5 % (ref 11.5–15.5)
WBC: 9.4 10*3/uL (ref 4.0–10.5)

## 2014-08-06 LAB — BASIC METABOLIC PANEL
Anion gap: 13 (ref 5–15)
BUN: 20 mg/dL (ref 6–23)
CHLORIDE: 103 meq/L (ref 96–112)
CO2: 25 mEq/L (ref 19–32)
Calcium: 8.8 mg/dL (ref 8.4–10.5)
Creatinine, Ser: 0.98 mg/dL (ref 0.50–1.35)
GFR calc non Af Amer: 81 mL/min — ABNORMAL LOW (ref >90)
GLUCOSE: 106 mg/dL — AB (ref 70–99)
POTASSIUM: 4.1 meq/L (ref 3.7–5.3)
SODIUM: 141 meq/L (ref 137–147)

## 2014-08-06 LAB — PRO B NATRIURETIC PEPTIDE: PRO B NATRI PEPTIDE: 4333 pg/mL — AB (ref 0–125)

## 2014-08-06 MED ORDER — SPIRONOLACTONE 12.5 MG HALF TABLET
12.5000 mg | ORAL_TABLET | Freq: Every day | ORAL | Status: DC
  Administered 2014-08-06: 12.5 mg via ORAL
  Filled 2014-08-06 (×2): qty 1

## 2014-08-06 NOTE — Progress Notes (Signed)
Subjective:  Feels better; no recurrent chest pain; Breathing better  Objective:   Vital Signs in the last 24 hours: Temp:  [97.9 F (36.6 C)-98.6 F (37 C)] 97.9 F (36.6 C) (10/20 0404) Pulse Rate:  [59-68] 59 (10/20 0404) Resp:  [16-18] 18 (10/20 0404) BP: (121-126)/(65-71) 121/65 mmHg (10/20 0404) SpO2:  [97 %-99 %] 99 % (10/20 0404) Weight:  [149 lb 3.2 oz (67.677 kg)] 149 lb 3.2 oz (67.677 kg) (10/20 0404)  Intake/Output from previous day: 10/19 0701 - 10/20 0700 In: 240 [P.O.:240] Out: 1550 [Urine:1550]  I/O since admission: 398  With excellent diuresis yesterday -1310  Medications: . amLODipine  10 mg Oral Daily   And  . benazepril  40 mg Oral Daily  . aspirin EC  81 mg Oral Daily  . clopidogrel  75 mg Oral QHS  . ezetimibe-simvastatin  1 tablet Oral q1800  . folic acid  1 mg Oral Daily  . furosemide  60 mg Oral BID  . isosorbide mononitrate  90 mg Oral Daily  . loratadine  10 mg Oral Daily  . metoprolol  5 mg Intravenous Once  . metoprolol  50 mg Oral BID  . multivitamin with minerals  1 tablet Oral Daily  . omega-3 acid ethyl esters  1 g Oral BID  . pantoprazole  40 mg Oral QHS  . potassium chloride  20 mEq Oral Daily  . potassium chloride  40 mEq Oral Once  . sodium chloride  3 mL Intravenous Q12H  . sodium chloride  3 mL Intravenous Q12H  . thiamine  100 mg Oral Daily   Or  . thiamine  100 mg Intravenous Daily       Physical Exam:   General appearance: alert, cooperative and no distress Neck: no adenopathy, no JVD, supple, symmetrical, trachea midline and thyroid not enlarged, symmetric, no tenderness/mass/nodules Lungs: decreased BS; more clear today Heart: regular rate and rhythm and 2/6 AS murmur Abdomen: soft, non-tender; bowel sounds normal; no masses,  no organomegaly Extremities: no edema, redness or tenderness in the calves or thighs Neurologic: Grossly normal   Rate: 70  Rhythm: normal sinus rhythm  Lab Results:   Recent  Labs  08/05/14 0515 08/06/14 0519  NA 138 141  K 3.8 4.1  CL 99 103  CO2 25 25  GLUCOSE 94 106*  BUN 19 20  CREATININE 1.09 0.98     Recent Labs  08/05/14 2318 08/06/14 0519  TROPONINI 3.28* 2.73*    Hepatic Function Panel No results found for this basename: PROT, ALBUMIN, AST, ALT, ALKPHOS, BILITOT, BILIDIR, IBILI,  in the last 72 hours No results found for this basename: INR,  in the last 72 hours BNP (last 3 results)  Recent Labs  03/13/14 1445 08/03/14 1134 08/06/14 0519  PROBNP 1311.0* 10286.0* 4333.0*    Lipid Panel     Component Value Date/Time   CHOL 143 03/01/2014 0129   TRIG 61 03/01/2014 0129   HDL 64 03/01/2014 0129   CHOLHDL 2.2 03/01/2014 0129   VLDL 12 03/01/2014 0129   LDLCALC 67 03/01/2014 0129      Imaging:  No results found.    Assessment/Plan:   Active Problems:   Acute coronary syndrome   Acute systolic heart failure  68 y/o male with h/o tobacco abuse, ETOH abuse, HTN, HLD, CAD s/p prior CABG; inf MI in 02/2014 with LM stenting and ischemic CM who presented to the [facility] ED on 08/01/14 with chest pain and was taken  back for urgent cardiac cath.  Day 5 s/p Acute coronary syndrome with NSTEMI (08/01/14) secondary to severe native CAD as previously described with known occlusion of the native right coronary artery as well as SVG-RCA, severe left main disease s/p stent placement with ISR distally extending into the proximal circumflex eyes are very known LAD occlusion with patent LIMA to LAD and stent distal to the graft insertion. Known occlusion of the SVG-OM with multiple stents in the circumflex and OM. Successful PCI of the proximal OM1 with an overlapping DES to prox circumflex stent. Successful balloon angioplasty of the distal left main in-stent restenosis with a 3.5 mm post dilation balloon with tilting in ~10% residual. Severely elevated LVEDP with severely reduced Ejection Fraction by hand-injection left ventriculography.  -- Pk troponin  16.22.  -- By 2D echo EF 45%.  -- Cont asa, statin, plavix, bb, Imdur.  -- Cardiac rehab post-procedure.  HTN: well controlled on metoprolol 50mg  BID, benazepril 40mg  qd and amlodipine 10mg   HL: LDL 67 in May. Cont statin/zetia.  Tob Abuse: Cessation advised. He has some patches at home. I offered to call some in but he stated they were cheaper OTC.  ETOH Abuse: Drinks a 6 pack/day -> CIWA protocol.  Ao Stenosis: 2D ECHO on 1018/15 w/ mild AS (may be underestimated due to LV dysfunction). Mean gradient (S): 14 mm Hg. Peak gradient (S): 25 mm Hg.  Hypokalemia: repleted; K today 4.1 Acute systolic CHF/Ischemic CM with significantly elevated BNP (10.2K)  -- 2D ECHO: 08/02/2014; EF 45%; severe hypokinesis base/mid-inferolat segments and severe hypokinesis base inferior segment. Mod LA dilation. Mild RV systolic dysfunction.  Excellent diuresis yesterday with -1310.  BNP much improved but still elevated at 4333 today. Transitioned to oral Lasix. Will also start spironolactone 12.5 mg daily with post MI CHF. Possible DC tomorrow.   Troy Sine, MD, [facility] 08/06/2014, 9:18 AM

## 2014-08-06 NOTE — Progress Notes (Signed)
CARDIAC REHAB PHASE I   PRE:  Rate/Rhythm: 60 SR  BP:  Supine:   Sitting: 110/60  Standing:    SaO2: 97 RA  MODE:  Ambulation: 650 ft   POST:  Rate/Rhythm: 70  BP:  Supine:   Sitting: 120/70  Standing:    SaO2: 98 RA 1450-1510 Pt tolerated ambulation well without c/o of cp or SOB. VS stable Pt back to recliner after walk with call light in reach. Reinforced sodium restrictions, pt does not seem committed to it at all.  Rodney Langton RN 08/06/2014 3:10 PM

## 2014-08-06 NOTE — Progress Notes (Signed)
Heart Failure Navigator Consult Note  Presentation: Steve Durham is a 68 y/o male with h/o tobacco abuse, ETOH abuse, HTN, HLD, CAD s/p prior CABG; inf MI in 02/2014 with LM stenting and ischemic CM who presented to the [facility] ED on 08/01/14 with chest pain and was taken back for urgent cardiac cath.  Acute coronary syndrome with NSTEMI (08/01/14) secondary to severe native CAD as previously described with known occlusion of the native right coronary artery as well as SVG-RCA, severe left main disease s/p stent placement with ISR distally extending into the proximal circumflex eyes are very known LAD occlusion with patent LIMA to LAD and stent distal to the graft insertion. Known occlusion of the SVG-OM with multiple stents in the circumflex and OM. Successful PCI of the proximal OM1 with an overlapping DES to prox circumflex stent. Successful balloon angioplasty of the distal left main in-stent restenosis with a 3.5 mm post dilation balloon with tilting in ~10% residual. Severely elevated LVEDP with severely reduced Ejection Fraction by hand-injection left ventriculography.    Past Medical History  Diagnosis Date  . Hypertension   . Hyperlipoproteinemia   . Aortic stenosis     a. Mod AS/AI by cath 07/2012.;  b.  Echo (07/31/13) is: EF 55-60%, mild aortic stenosis (mean gradient 13);  c. Echo (5/15):  EF 40-45%, mild AS (mean 12 mmHg), mod AI  . Back pain   . Carotid artery occlusion     a. Dopplers 07/2012: no significant high grade obstruction.  . Hyperlipidemia   . Abnormal CT scan, kidney     07/2012 - multiple small cysts  . Cholelithiasis     Seen on CT 07/2012  . Coronary artery disease     a. CABG 10/1991. b. cath 07/2012 with prog dsz, turned down for re-do CABG - for med rx for now.; c. NSTEMI/PCI: DES to mLAD ext into IMA graft;  d. Inf STEMI (5/15):  LM 60-70% then 99% before LAD, pLAD occl, pCFX stent 80+% ISR, oRCA 99% then occl (L-R collats to dRCA), L-LAD ok with patent stent,  S-CFX occl (old), S-RCA occl (old); PCI: LM ext into CFX with Xience Alpine DES  . Ischemic cardiomyopathy     a. 2D ECHO: 08/02/2014; EF 45%; severe hypokinesis base/mid-inferolat segments and severe hypokinesis base inferior segment. Mild LVH, mild AS (may be underestimated due to LV dysfunction).  Mean gradient (S): 14 mm Hg. Peak gradient (S): 25 mm Hg. VTI ratio of LVOT to aortic valve: 0.37. Mod LA dilation. Mild RV systolic dysfxn     History   Social History  . Marital Status: Married    Spouse Name: N/A    Number of Children: 1  . Years of Education: N/A   Occupational History  . Plumbing     Retired in 2007   Social History Main Topics  . Smoking status: Current Every Day Smoker -- 1.00 packs/day for 50 years  . Smokeless tobacco: None     Comment: has cut back   . Alcohol Use: 8.4 oz/week    14 Cans of beer per week     Comment: daily-6 daily  . Drug Use: No  . Sexual Activity: None   Other Topics Concern  . None   Social History Narrative   Lives in [place] with his family.  Does not routinely exercise.    ECHO:Study Conclusions--08/02/14  - Left ventricle: Severe hypokinesis base/mid-inferolateral segments and severe hypokinesis base inferior segment. EF is 45%. The cavity  size was mildly dilated. Wall thickness was increased in a pattern of mild LVH. - Aortic valve: Leaflets are calcified. Doppler suggests mild AS. The AS may be underestimated because of the LV dysfunction. Mean gradient (S): 14 mm Hg. Peak gradient (S): 25 mm Hg. VTI ratio of LVOT to aortic valve: 0.37. - Left atrium: The atrium was moderately dilated. - Right ventricle: The cavity size was normal. Systolic function was mildly reduced.  ------------------------------------------------------------------- Labs, prior tests, procedures, and surgery: Coronary artery bypass grafting.  Transthoracic echocardiography. M-mode, complete 2D, spectral Doppler, and color Doppler. Birthdate:  Patient birthdate: 06/02/1946. Age: Patient is 68 yr old. Sex: Gender: male. BMI: 24.2 kg/m^2. Blood pressure: 126/70 Patient status: Inpatient. Study date: Study date: 08/02/2014. Study time: 10:01 AM. Location: ICU/CCU    BNP    Component Value Date/Time   PROBNP 4333.0* 08/06/2014 0519    Education Assessment and Provision:  Detailed education and instructions provided on heart failure disease management including the following:  Signs and symptoms of Heart Failure When to call the physician Importance of daily weights Low sodium diet Fluid restriction Medication management Anticipated future follow-up appointments  Patient education given on each of the above topics.  Patient acknowledges understanding and acceptance of all instructions.  I spoke with Steve Durham regarding his recent hospitalization and HF related to his heart disease.  He has had some previous education.  He says that he has a scale -has not been previously weighing at home however will have no issue with that.  He and his wife usually eat out and he understands that makes it nearly impossible to manage his sodium intake.  He does also say that he has issues affording medications yet "always makes it work".  He lives in [place] with his wife.  I reviewed all above topics and he verbalizes understanding and is able to teach back most topics.   Education Materials:  "Living Better With Heart Failure" Booklet, Daily Weight Tracker Tool   High Risk Criteria for Readmission and/or Poor Patient Outcomes:   EF <30%- no 45%  2 or more admissions in 6 months- Yes  Difficult social situation- No  Demonstrates medication noncompliance- No-however does say that he has trouble affording medications at times.  Barriers of Care:  Knowledge, compliance  Discharge Planning:   Plans to go home in [place] with is wife.  Will follow outpatient with [facility].

## 2014-08-06 NOTE — Progress Notes (Signed)
UR complete.  Tryniti Laatsch RN, MSN 

## 2014-08-07 LAB — BASIC METABOLIC PANEL
Anion gap: 13 (ref 5–15)
BUN: 21 mg/dL (ref 6–23)
CALCIUM: 9.2 mg/dL (ref 8.4–10.5)
CHLORIDE: 102 meq/L (ref 96–112)
CO2: 25 mEq/L (ref 19–32)
CREATININE: 1 mg/dL (ref 0.50–1.35)
GFR calc Af Amer: 86 mL/min — ABNORMAL LOW (ref >90)
GFR, EST NON AFRICAN AMERICAN: 74 mL/min — AB (ref >90)
Glucose, Bld: 104 mg/dL — ABNORMAL HIGH (ref 70–99)
Potassium: 4.6 mEq/L (ref 3.7–5.3)
Sodium: 140 mEq/L (ref 137–147)

## 2014-08-07 LAB — CBC
HEMATOCRIT: 44.1 % (ref 39.0–52.0)
Hemoglobin: 14.5 g/dL (ref 13.0–17.0)
MCH: 32 pg (ref 26.0–34.0)
MCHC: 32.9 g/dL (ref 30.0–36.0)
MCV: 97.4 fL (ref 78.0–100.0)
PLATELETS: 224 10*3/uL (ref 150–400)
RBC: 4.53 MIL/uL (ref 4.22–5.81)
RDW: 12.5 % (ref 11.5–15.5)
WBC: 9.7 10*3/uL (ref 4.0–10.5)

## 2014-08-07 LAB — PRO B NATRIURETIC PEPTIDE
PRO B NATRI PEPTIDE: 3634 pg/mL — AB (ref 0–125)
Pro B Natriuretic peptide (BNP): 3748 pg/mL — ABNORMAL HIGH (ref 0–125)

## 2014-08-07 LAB — TROPONIN I
Troponin I: 1.67 ng/mL (ref <0.30)
Troponin I: 1.93 ng/mL (ref <0.30)

## 2014-08-07 MED ORDER — ISOSORBIDE MONONITRATE ER 60 MG PO TB24: 90.0000 mg | ORAL_TABLET | Freq: Every day | ORAL | Status: DC

## 2014-08-07 MED ORDER — FUROSEMIDE 40 MG PO TABS
40.0000 mg | ORAL_TABLET | Freq: Two times a day (BID) | ORAL | Status: DC
  Filled 2014-08-07 (×2): qty 1

## 2014-08-07 MED ORDER — SPIRONOLACTONE 25 MG PO TABS: 12.5000 mg | ORAL_TABLET | Freq: Two times a day (BID) | ORAL | Status: DC

## 2014-08-07 MED ORDER — SPIRONOLACTONE 12.5 MG HALF TABLET
12.5000 mg | ORAL_TABLET | Freq: Two times a day (BID) | ORAL | Status: DC
  Filled 2014-08-07 (×2): qty 1

## 2014-08-07 MED ORDER — AMLODIPINE BESY-BENAZEPRIL HCL 10-40 MG PO CAPS: 1.0000 | ORAL_CAPSULE | Freq: Every day | ORAL | Status: DC

## 2014-08-07 MED ORDER — FUROSEMIDE 40 MG PO TABS: 40.0000 mg | ORAL_TABLET | Freq: Two times a day (BID) | ORAL | Status: DC

## 2014-08-07 MED ORDER — POTASSIUM CHLORIDE CRYS ER 20 MEQ PO TBCR: 20.0000 meq | EXTENDED_RELEASE_TABLET | Freq: Every day | ORAL | Status: DC

## 2014-08-07 NOTE — Progress Notes (Signed)
Patient d/c papers given and educated. Awaiting on transportation. All assessments remains the same.

## 2014-08-07 NOTE — Progress Notes (Signed)
Subjective:  Feels well; no chest pain or SOB  Objective:   Vital Signs in the last 24 hours: Temp:  [97.7 F (36.5 C)-98.5 F (36.9 C)] 97.7 F (36.5 C) (10/21 0653) Pulse Rate:  [59-66] 59 (10/21 0653) Resp:  [18] 18 (10/21 0653) BP: (105-122)/(53-61) 113/53 mmHg (10/21 0653) SpO2:  [98 %-100 %] 100 % (10/21 0653) Weight:  [147 lb 11.3 oz (67 kg)] 147 lb 11.3 oz (67 kg) (10/21 0653)  Intake/Output from previous day: 10/20 0701 - 10/21 0700 In: 360 [P.O.:360] Out: 2050 [Urine:2050]  I/O since admission: -1171 (-1690 yesterday)   Medications: . amLODipine  10 mg Oral Daily   And  . benazepril  40 mg Oral Daily  . aspirin EC  81 mg Oral Daily  . clopidogrel  75 mg Oral QHS  . ezetimibe-simvastatin  1 tablet Oral q1800  . folic acid  1 mg Oral Daily  . furosemide  60 mg Oral BID  . isosorbide mononitrate  90 mg Oral Daily  . loratadine  10 mg Oral Daily  . metoprolol  5 mg Intravenous Once  . metoprolol  50 mg Oral BID  . multivitamin with minerals  1 tablet Oral Daily  . omega-3 acid ethyl esters  1 g Oral BID  . pantoprazole  40 mg Oral QHS  . potassium chloride  20 mEq Oral Daily  . potassium chloride  40 mEq Oral Once  . sodium chloride  3 mL Intravenous Q12H  . sodium chloride  3 mL Intravenous Q12H  . spironolactone  12.5 mg Oral Daily  . thiamine  100 mg Oral Daily   Or  . thiamine  100 mg Intravenous Daily       Physical Exam:   General appearance: alert, cooperative and no distress  Neck: no adenopathy, no JVD, supple, symmetrical, trachea midline and thyroid not enlarged, symmetric, no tenderness/mass/nodules  Lungs: now clear today  Heart: regular rate and rhythm and 2/6 AS murmur  Abdomen: soft, non-tender; bowel sounds normal; no masses, no organomegaly  Extremities: no edema, redness or tenderness in the calves or thighs  Neurologic: Grossly normal   Rate: 60  Rhythm: normal sinus rhythm  Lab Results:   Recent Labs  08/06/14 0519  08/07/14 0530  NA 141 140  K 4.1 4.6  CL 103 102  CO2 25 25  GLUCOSE 106* 104*  BUN 20 21  CREATININE 0.98 1.00     Recent Labs  08/06/14 2330 08/07/14 0530  TROPONINI 1.93* 1.67*    Hepatic Function Panel No results found for this basename: PROT, ALBUMIN, AST, ALT, ALKPHOS, BILITOT, BILIDIR, IBILI,  in the last 72 hours No results found for this basename: INR,  in the last 72 hours BNP (last 3 results)  Recent Labs  08/03/14 1134 08/06/14 0519 08/07/14 0530  PROBNP 10286.0* 4333.0* 3748.0*  3634.0*    Lipid Panel     Component Value Date/Time   CHOL 143 03/01/2014 0129   TRIG 61 03/01/2014 0129   HDL 64 03/01/2014 0129   CHOLHDL 2.2 03/01/2014 0129   VLDL 12 03/01/2014 0129   LDLCALC 67 03/01/2014 0129      Imaging:  No results found.    Assessment/Plan:   Active Problems:   Acute coronary syndrome   Acute systolic heart failure   Acute coronary syndrome  Acute systolic heart failure   68 y/o male with h/o tobacco abuse, ETOH abuse, HTN, HLD, CAD s/p prior CABG; inf MI in 02/2014  with LM stenting and ischemic CM who presented to [facility] ED on 08/01/14 with chest pain and was taken back for urgent cardiac cath.  Day 6 s/p Acute coronary syndrome with NSTEMI (08/01/14) secondary to severe native CAD as previously described with known occlusion of the native right coronary artery as well as SVG-RCA, severe left main disease s/p stent placement with ISR distally extending into the proximal circumflex eyes are very known LAD occlusion with patent LIMA to LAD and stent distal to the graft insertion. Known occlusion of the SVG-OM with multiple stents in the circumflex and OM. Successful PCI of the proximal OM1 with an overlapping DES to prox circumflex stent. Successful balloon angioplasty of the distal left main in-stent restenosis with a 3.5 mm post dilation balloon with tilting in ~10% residual. Severely elevated LVEDP with severely reduced Ejection Fraction by  hand-injection left ventriculography.  -- Pk troponin 16.22.  -- By 2D echo EF 45%.  -- Cont asa, statin, plavix, bb, Imdur.  -- Cardiac rehab post-procedure.  HTN: well controlled on metoprolol 50mg  BID, benazepril 40mg  qd and amlodipine 10mg , furosemide and spironolactone HL: LDL 67 in May. Cont statin/zetia.  Tob Abuse: Cessation advised. He has some patches at home. I offered to call some in but he stated they were cheaper OTC.  ETOH Abuse: Drinks a 6 pack/day -> CIWA protocol.  Ao Stenosis: 2D ECHO on 1018/15 w/ mild AS (may be underestimated due to LV dysfunction). Mean gradient (S): 14 mm Hg. Peak gradient (S): 25 mm Hg.  Hypokalemia: repleted; K today 4.1  Acute systolic CHF/Ischemic CM with significantly elevated BNP max (10.2K)  -- 2D ECHO: 08/02/2014; EF 45%; severe hypokinesis base/mid-inferolat segments and severe hypokinesis base inferior segment. Mod LA dilation. Mild RV systolic dysfunction.  Excellent diuresis again yesterday with -1690. BNP continues to improve but still elevated at 3634 today. Transitioned to oral Lasix. Spironolactone 12.5 mg daily  Started yesterday for posI CHF.  Will change lasix to 40 mg bid and titrate spironolactone to 12.5 bid with plans for continued outpatient titration as necessary. DC today; f/u in office in 1 week and with D. Thornell Mule, MD, [facility] 08/07/2014, 8:51 AM

## 2014-08-07 NOTE — Discharge Summary (Signed)
Physician Discharge Summary  Patient ID: Steve Durham MRN: [id number] DOB/AGE: 1946/08/11 68 y.o.  Primary Cardiologist: Dr. Angelena Form  Admit date: 08/01/2014 Discharge date: 08/07/2014  Admission Diagnoses: Acute Coronary Syndrome  Discharge Diagnoses:  Active Problems:   Acute coronary syndrome   Acute systolic heart failure   Discharged Condition: stable  Hospital Course: The patient is a 68 y/o male, followed by Dr. Angelena Form, with a h/o CAD s/p prior CABG and inf MI in 02/2014 with LM stenting who presented to the [facility] ED 08/01/14 with recurrent chest pain and diffuse ST depressions concerning for lateral ischemia. He was hypertensive and tachycardic in ER and treated with IV Lopressor. He was taken to the cardiac catheterization lab urgently. The procedure was performed by Dr. Ellyn Hack. He was found to have severe native CAD as previously described with known occlusion of the native right coronary artery as well as SVG-RCA, severe left main disease status post stent placement with ISR distally extending into the proximal circumflex and already known LAD occlusion with patent LIMA to LAD and stent distal to the graft insertion. Also known occlusion of the SVG-OM with multiple stents in the circumflex and OM. He underwent successful PCI of the proximal OM1 with an overlapping Xience Alpine DES 3.0 mm x 23 mm that overlaps the proximal circumflex stent. He underwent successful balloon angioplasty of the distal left main in-stent restenosis with a 3.5 mm post dilation balloon with tilting in ~10% residual. EF was estimated at 25%. He left the cath lab in stable condition. He was continued on DAPT with ASA + Plavix as well as his BB, ACE and statin. Imdur was added. He had no further chest pain. He had no post cath complications.  Post recovery however was complicated by volume overload. He required IV Lasix. Post PCI 2D echo revealed an EF of 45%. He diuresed well. He was transitioned to 40 mg PO  lasix BID. 12.5 mg of spironolactone BID was also added. He was last seen and examined by Dr. Claiborne Billings, who determined he was stable for discharge home. Post-hospital f/u has been arranged with Richardson Dopp, PA-C on 08/13/14.   Consults: None  Significant Diagnostic Studies:   LHC 08/02/14 Hemodynamics:  Central Aortic Pressure / Mean: 143/86/107 mmHg  Left Ventricular Pressure / LVEDP: 142/33/45 mmHg Left Ventriculography: Hand injection due to severely elevated LVEDP of 45 mmHg  EF: Roughly 25%  Wall Motion: Hypokinesis Coronary Anatomy:  Dominance: Right Left Main: Long large caliber vessel with a stent for the near ostial location extending into the proximal circumflex. The distal portion of the stent at the takeoff of the LAD which is occluded as a focal 80% hazy in-stent restenosis. LAD: Totally occluded proximally after takeoff. There is a small septal perforator.  LIMA-LAD: Widely patent graft anastomosing to the mid LAD. There LAD stent distal to the anastomosis is widely patent with brisk TIMI-3 flow down around the apex. There is left to right collaterals filling the Right Posterior Descending Artery. Left Circumflex: Large caliber vessel with an ostial stent that goes into the large lateral OM. There is diffuse roughly 50% in-stent restenosis in this stent followed by a segmental eccentric 90% stenosis in the proximal portion of the lateral OM with a normal segment of about 18 mm prior to the previously placed distal OM stent.  OM1: Proximal 90% stenosis noted. Distally the vessel trifurcates into 3 major branches. There is extensive collaterals to the right posterior lateral system from this vessel as well  as from the AV groove circumflex residual vessel Vessels/Grafts Not Visualized Due To Non-Inclusion:  RCA: Known 100% occlusion in the mid vessel after 99% ostial stenosis. Not imaged  SVG-Circumflex, and SVG-RCA both known to be occluded and not imaged. After reviewing the  initial angiography, the culprit lesion was thought to be the 90% proximal OM1 lesion as well as the distal LM stent ~80% ISR. Preparation were made to proceed with PCI on the OM lesion & PTCA of the LM lesion & intervening proximal Circumflex..  Percutaneous Coronary Intervention: Sheath exchanged for 6 Fr  Guide: 6 Fr CLS Guidewire: BMW  LESION #1 (Culprit): proximal OM1 (after Cx-OM stent) ~95% tubular eccentric - reduced to 0%;  TIMI 3 flow pre & post. Predilation Balloon: Euphora 2.5 mm x 15 mm;  12 Atm x 30 Sec Stent: Xience Alpine 3.0 mm x 23 mm;  18 Atm x 45 Sec, - distal diameter 3.4 mm  Balloon also used for dLM into stent - 16 Atm x 30 Sec Post-dilation Balloon: [place] Euphora 3.5 mm x 12 mm; ;  Cx-OM - 12 Atm x 30, pLCx 14 Atm x 30 Sec  Final Diameter: 3.6 mm prox stent, 3.4 mm distal stent  200 mcg NTG LESION #2: Distal LM focal hazy ISR ~80% with proximal OM ~50-60% ISR - reduced to 0% with post dilation  TIMI 3 flow pre & post. Predilation Balloon: Euphora 2.5 mm x 15 mm;  12 Atm x 30 Sec Post-dilation Balloon: [place] Euphora 3.5 mm x 12 mm;  dLM-Ostial Cx 16 Atm x 35 Sec, dLM 16 Atm x 30 Sec  Final Diameter ~3.65 - 3.6 mm from LM into new Stent     2D echo 08/02/14 Study Conclusions  - Left ventricle: Severe hypokinesis base/mid-inferolateral segments and severe hypokinesis base inferior segment. EF is 45%. The cavity size was mildly dilated. Wall thickness was increased in a pattern of mild LVH. - Aortic valve: Leaflets are calcified. Doppler suggests mild AS. The AS may be underestimated because of the LV dysfunction. Mean gradient (S): 14 mm Hg. Peak gradient (S): 25 mm Hg. VTI ratio of LVOT to aortic valve: 0.37. - Left atrium: The atrium was moderately dilated. - Right ventricle: The cavity size was normal. Systolic function was mildly reduced.    Treatments: See Hospital Course  Discharge Exam: Blood pressure 120/56, pulse 63, temperature 97.4 F (36.3 C),  temperature source Oral, resp. rate 18, height 5' 5"  (1.651 m), weight 147 lb 11.3 oz (67 kg), SpO2 98.00%.   Disposition: 01-Home or Self Care      Discharge Instructions   Diet - low sodium heart healthy    Complete by:  As directed      Increase activity slowly    Complete by:  As directed             Medication List    STOP taking these medications       amLODipine-benazepril 5-20 MG per capsule  Commonly known as:  LOTREL  Replaced by:  amLODipine-benazepril 10-40 MG per capsule      TAKE these medications       amLODipine-benazepril 10-40 MG per capsule  Commonly known as:  LOTREL  Take 1 capsule by mouth daily.     aspirin EC 81 MG tablet  Take 81 mg by mouth at bedtime.     clopidogrel 75 MG tablet  Commonly known as:  PLAVIX  Take 75 mg by mouth at bedtime.  ezetimibe-simvastatin 10-40 MG per tablet  Commonly known as:  VYTORIN  Take 1 tablet by mouth at bedtime.     fexofenadine 180 MG tablet  Commonly known as:  ALLEGRA  Take 180 mg by mouth daily.     Fish Oil 1000 MG Caps  Take 1,000 mg by mouth 2 (two) times daily.     furosemide 40 MG tablet  Commonly known as:  LASIX  Take 1 tablet (40 mg total) by mouth 2 (two) times daily.     isosorbide mononitrate 60 MG 24 hr tablet  Commonly known as:  IMDUR  Take 1.5 tablets (90 mg total) by mouth daily.     metoprolol 50 MG tablet  Commonly known as:  LOPRESSOR  Take 50 mg by mouth 2 (two) times daily.     nitroGLYCERIN 0.4 MG SL tablet  Commonly known as:  NITROSTAT  Place 0.4 mg under the tongue every 5 (five) minutes as needed for chest pain.     pantoprazole 40 MG tablet  Commonly known as:  PROTONIX  Take 40 mg by mouth at bedtime.     potassium chloride SA 20 MEQ tablet  Commonly known as:  K-DUR,KLOR-CON  Take 1 tablet (20 mEq total) by mouth daily.     spironolactone 25 MG tablet  Commonly known as:  ALDACTONE  Take 0.5 tablets (12.5 mg total) by mouth 2 (two) times daily.        Follow-up Information   Follow up with Richardson Dopp, PA-C On 08/13/2014. (12:00 pm (Dr. Camillia Herter PA))    Specialty:  Physician Assistant   Contact information:   [address] [place] [address] [phone]      TIME SPENT ON DISCHARGE, INCLUDING PHYSICIAN TIME: >30 MINUTES  Signed: Lyda Jester 08/07/2014, 3:02 PM

## 2014-08-13 ENCOUNTER — Ambulatory Visit (INDEPENDENT_AMBULATORY_CARE_PROVIDER_SITE_OTHER): Payer: Medicare Other | Admitting: Physician Assistant

## 2014-08-13 ENCOUNTER — Encounter: Payer: Self-pay | Admitting: Physician Assistant

## 2014-08-13 VITALS — BP 140/82 | HR 53 | Ht 65.0 in | Wt 150.0 lb

## 2014-08-13 DIAGNOSIS — I200 Unstable angina: Secondary | ICD-10-CM

## 2014-08-13 DIAGNOSIS — I255 Ischemic cardiomyopathy: Secondary | ICD-10-CM | POA: Diagnosis not present

## 2014-08-13 DIAGNOSIS — I5022 Chronic systolic (congestive) heart failure: Secondary | ICD-10-CM

## 2014-08-13 DIAGNOSIS — R0600 Dyspnea, unspecified: Secondary | ICD-10-CM

## 2014-08-13 DIAGNOSIS — I10 Essential (primary) hypertension: Secondary | ICD-10-CM

## 2014-08-13 DIAGNOSIS — I2510 Atherosclerotic heart disease of native coronary artery without angina pectoris: Secondary | ICD-10-CM | POA: Diagnosis not present

## 2014-08-13 DIAGNOSIS — I350 Nonrheumatic aortic (valve) stenosis: Secondary | ICD-10-CM

## 2014-08-13 DIAGNOSIS — Z720 Tobacco use: Secondary | ICD-10-CM

## 2014-08-13 DIAGNOSIS — E785 Hyperlipidemia, unspecified: Secondary | ICD-10-CM | POA: Diagnosis not present

## 2014-08-13 LAB — BASIC METABOLIC PANEL
BUN: 35 mg/dL — ABNORMAL HIGH (ref 6–23)
CO2: 28 meq/L (ref 19–32)
CREATININE: 1.5 mg/dL (ref 0.4–1.5)
Calcium: 9.5 mg/dL (ref 8.4–10.5)
Chloride: 101 mEq/L (ref 96–112)
GFR: 49.91 mL/min — AB (ref >60.00)
Glucose, Bld: 94 mg/dL (ref 70–99)
Potassium: 5.1 mEq/L (ref 3.5–5.1)
Sodium: 134 mEq/L — ABNORMAL LOW (ref 135–145)

## 2014-08-13 LAB — BRAIN NATRIURETIC PEPTIDE: Pro B Natriuretic peptide (BNP): 410 pg/mL — ABNORMAL HIGH (ref 0.0–100.0)

## 2014-08-13 NOTE — Patient Instructions (Signed)
LAB WORK TODAY; BMET, BNP  INCREASE PROTONIX TO 40 MG TWICE DAILY FOR 2 WEEKS THEN GO BACK TO ONCE DAILY  KEEP YOUR APPT WITH DR. Angelena Form IN 09/2014

## 2014-08-13 NOTE — Progress Notes (Signed)
Cardiology Office Note   Date:  08/13/2014   ID:  Steve Durham, DOB [birth year], MRN [id number]  PCP:  No PCP Per Patient  Cardiologist:  Dr. Lauree Chandler     History of Present Illness: Steve Durham is a 68 y.o. male with a h/o CAD s/p prior CABG 1993 and inf MI in 02/2014 with LM stenting (both SVGs known to be occluded), ICM, systolic CHF, aortic stenosis, HTN, HL.  Turned down for redo CABG in 2013.  Last seen by Dr. Lauree Chandler in 04/2014.    He was admitted 10/15-10/21 with a non-STEMI. Urgent cardiac catheterization given ongoing symptoms demonstrated high-grade lesion proximal OM1 and high-grade in-stent restenosis in the distal portion of the left main stent. He underwent DES placement to the proximal OM1 and balloon angioplasty to the left main.  Course was complicated by acute on chronic systolic CHF. He required diuresis with IV Lasix. LVEDP at his cardiac catheterization was elevated at 45 mmHg. EF was 25% at cardiac catheterization. Follow-up echocardiogram demonstrated an EF of 45% and mild aortic stenosis.  He returns for follow-up. He has occasional chest tightness. He's had this symptom for years without significant change. He's not had any recurrent anginal symptoms. He notes dyspnea with exertion. This is chronic. He is NYHA 2-2B. He notes orthopnea. He sometimes has to sleep on an incline and often sleeps in a recliner. He does describe symptoms that are consistent with PND.  These symptoms are chronic without significant change. He has a nonproductive cough. His wife states that he does snore. He admits to daytime sleepiness.   Studies:  - LHC (08/01/14):  EF 25%, LVEDP 45 mmHg, left main stent with distal 80% I SR, proximal LAD occluded, LIMA-LAD patent, LAD stent distal to anastomosis patent, ostial circumflex stent with 50% ISR followed by 90% in the proximal portion of the lateral OM, proximal OM1 90%, known mid RCA occluded, SVG-circumflex and  SVG-RCA both known to be occluded >> PCI:  overlapping Xience Alpine DES 3.0 mm x 23 mm to prox OM1 that overlaps the proximal circumflex stent; balloon angioplasty of the distal left main in-stent restenosis   - Echo (08/02/14):   Severe hypokinesis base/mid-inferolateral, severe hypokinesis base inferior segment. EF is 45%.  Mild LVH.  Mild AS. (Mean gradient (S): 14 mm Hg. Peak gradient (S): 25 mm Hg). Mod LAE, mild reduced RVSF  - Carotid US (10/13):  No sig ICA stenosis   Recent Labs/Images:  Recent Labs  03/01/14 0129  08/02/14 0240  08/07/14 0530  NA 140  < > 140  < > 140  K 4.1  < > 4.1  < > 4.6  BUN 17  < > 17  < > 21  CREATININE 0.82  < > 0.90  < > 1.00  ALT  --   < > 20  --   --   HGB 15.9  < > 14.6  < > 14.5  LDLCALC 67  --   --   --   --   HDL 64  --   --   --   --   PROBNP  --   < >  --   < > 3748.0*  3634.0*  < > = values in this interval not displayed.    Dg Chest Port 1 View  08/01/2014    IMPRESSION: No acute abnormalities.   Electronically Signed   By: Rozetta Nunnery M.D.   On: 08/01/2014 15:18  Wt Readings from Last 3 Encounters:  08/13/14 150 lb (68.04 kg)  08/07/14 147 lb 11.3 oz (67 kg)  08/07/14 147 lb 11.3 oz (67 kg)     Past Medical History  Diagnosis Date  . Hypertension   . Hyperlipoproteinemia   . Aortic stenosis     a. Mod AS/AI by cath 07/2012.;  b.  Echo (07/31/13) is: EF 55-60%, mild aortic stenosis (mean gradient 13);  c. Echo (5/15):  EF 40-45%, mild AS (mean 12 mmHg), mod AI  . Back pain   . Carotid artery occlusion     a. Dopplers 07/2012: no significant high grade obstruction.  . Hyperlipidemia   . Abnormal CT scan, kidney     07/2012 - multiple small cysts  . Cholelithiasis     Seen on CT 07/2012  . Coronary artery disease     a. CABG 10/1991. b. cath 07/2012 with prog dsz, turned down for re-do CABG - for med rx for now.; c. NSTEMI/PCI: DES to mLAD ext into IMA graft;  d. Inf STEMI (5/15):  LM 60-70% then 99% before LAD, pLAD  occl, pCFX stent 80+% ISR, oRCA 99% then occl (L-R collats to dRCA), L-LAD ok with patent stent, S-CFX occl (old), S-RCA occl (old); PCI: LM ext into CFX with Xience Alpine DES  . Ischemic cardiomyopathy     a. 2D ECHO: 08/02/2014; EF 45%; severe hypokinesis base/mid-inferolat segments and severe hypokinesis base inferior segment. Mild LVH, mild AS (may be underestimated due to LV dysfunction).  Mean gradient (S): 14 mm Hg. Peak gradient (S): 25 mm Hg. VTI ratio of LVOT to aortic valve: 0.37. Mod LA dilation. Mild RV systolic dysfxn     Current Outpatient Prescriptions  Medication Sig Dispense Refill  . amLODipine-benazepril (LOTREL) 10-40 MG per capsule Take 1 capsule by mouth daily.  30 capsule  5  . aspirin EC 81 MG tablet Take 81 mg by mouth at bedtime.      . clopidogrel (PLAVIX) 75 MG tablet Take 75 mg by mouth at bedtime.      Marland Kitchen ezetimibe-simvastatin (VYTORIN) 10-40 MG per tablet Take 1 tablet by mouth at bedtime.       . fexofenadine (ALLEGRA) 180 MG tablet Take 180 mg by mouth daily.       . furosemide (LASIX) 40 MG tablet Take 1 tablet (40 mg total) by mouth 2 (two) times daily.  60 tablet  5  . isosorbide mononitrate (IMDUR) 60 MG 24 hr tablet Take 1.5 tablets (90 mg total) by mouth daily.  45 tablet  5  . metoprolol (LOPRESSOR) 50 MG tablet Take 50 mg by mouth 2 (two) times daily.      . nitroGLYCERIN (NITROSTAT) 0.4 MG SL tablet Place 0.4 mg under the tongue every 5 (five) minutes as needed for chest pain.      . Omega-3 Fatty Acids (FISH OIL) 1000 MG CAPS Take 1,000 mg by mouth 2 (two) times daily.        . pantoprazole (PROTONIX) 40 MG tablet Take 40 mg by mouth at bedtime.       . potassium chloride SA (K-DUR,KLOR-CON) 20 MEQ tablet Take 1 tablet (20 mEq total) by mouth daily.  30 tablet  5  . spironolactone (ALDACTONE) 25 MG tablet Take 0.5 tablets (12.5 mg total) by mouth 2 (two) times daily.  30 tablet  5   No current facility-administered medications for this visit.      Allergies:   Review of patient's allergies  indicates no known allergies.   Social History:  The patient  reports that he quit smoking 12 days ago. He does not have any smokeless tobacco history on file. He reports that he drinks about 8.4 ounces of alcohol per week. He reports that he does not use illicit drugs.   Family History:  The patient's family history includes Emphysema in his brother; Heart attack in his mother; Hypertension in his sister.   ROS:  Please see the history of present illness.       All other systems reviewed and negative.    PHYSICAL EXAM: VS:  BP 140/82  Pulse 53  Ht 5\' 5"  (1.651 m)  Wt 150 lb (68.04 kg)  BMI 24.96 kg/m2 Well nourished, well developed, in no acute distress HEENT: normal Neck: no JVD Cardiac:  normal S1, S2; RRR; 2/6 systolic murmurRUSB Lungs:  Decreased breath sounds bilaterally, no wheezing, rhonchi or rales Abd: soft, nontender, no hepatomegaly Ext: No edema;Left wrist without hematoma or mass  Skin: warm and dry Neuro:  CNs 2-12 intact, no focal abnormalities noted  EKG:  Sinus bradycardia, HR 53, normal axis, inferolateral T-wave inversions      ASSESSMENT AND PLAN:  Dyspnea-  He has dyspnea mainly with lying flat. This also awakens him at night. He's had this chronically without significant change. He had significant volume overload during his recent hospitalization with non-STEMI. He does not look particularly volume overloaded on exam. He does have a history of acid reflux disease. Question if his symptoms could be related to uncontrolled GERD. He also has symptoms that sound somewhat consistent with obstructive sleep apnea. Question if he has having apneic episodes awakening him.    -  Obtain BMET, BNP today. Adjust Lasix if needed.    -  Increase Protonix to 40 mg twice daily 2 weeks. If this helps, consider referral to GI.    -  Obtain sleep study. Patient declined this today. We can consider this again at  follow-up.  Coronary artery disease -  He is doing fairly well after recent non-STEMI treated with a DES to the OM1 and balloon angioplasty to the left main. Continue dual antiplatelet therapy with aspirin and Plavix. He will remain on amlodipine, statin, nitrates, beta blocker.  Ischemic cardiomyopathy with EF 40-45% -  EF was stable by echocardiogram after his PCI. Continue ACE inhibitor, nitrates, beta blocker, spironolactone.  Chronic systolic CHF (congestive heart failure) -  Volume appears stable on exam. Check BNP as noted above.  Aortic stenosis -  Mild by recent echocardiogram.  Essential hypertension -  Fair control.  Continue to monitor.  Hyperlipidemia -  Continue combination statin/Zetia  Tobacco abuse -  He has stopped smoking.  Disposition:   FU with Dr. Angelena Form in December as planned.   Signed, Versie Starks, MHS 08/13/2014 12:47 PM    [facility] [address], [place], [place]  [address] Phone: [phone]; Fax: [phone]

## 2014-08-15 ENCOUNTER — Telehealth: Payer: Self-pay | Admitting: Physician Assistant

## 2014-08-15 DIAGNOSIS — I10 Essential (primary) hypertension: Secondary | ICD-10-CM

## 2014-08-15 NOTE — Telephone Encounter (Signed)
New Message      Pt calling states he was returning a phone call from Declo. Please call pt back and advise.

## 2014-08-19 ENCOUNTER — Other Ambulatory Visit (INDEPENDENT_AMBULATORY_CARE_PROVIDER_SITE_OTHER): Payer: Medicare Other | Admitting: *Deleted

## 2014-08-19 DIAGNOSIS — I10 Essential (primary) hypertension: Secondary | ICD-10-CM

## 2014-08-19 LAB — BASIC METABOLIC PANEL
BUN: 30 mg/dL — AB (ref 6–23)
CO2: 24 mEq/L (ref 19–32)
Calcium: 8.9 mg/dL (ref 8.4–10.5)
Chloride: 105 mEq/L (ref 96–112)
Creatinine, Ser: 1.3 mg/dL (ref 0.4–1.5)
GFR: 57.45 mL/min — AB (ref >60.00)
GLUCOSE: 103 mg/dL — AB (ref 70–99)
Potassium: 4.4 mEq/L (ref 3.5–5.1)
Sodium: 135 mEq/L (ref 135–145)

## 2014-08-27 ENCOUNTER — Other Ambulatory Visit: Payer: Self-pay | Admitting: Physician Assistant

## 2014-09-20 ENCOUNTER — Ambulatory Visit (INDEPENDENT_AMBULATORY_CARE_PROVIDER_SITE_OTHER): Payer: Medicare Other | Admitting: Cardiovascular Disease

## 2014-09-20 ENCOUNTER — Encounter: Payer: Self-pay | Admitting: Cardiovascular Disease

## 2014-09-20 VITALS — BP 129/60 | HR 54 | Ht 65.0 in | Wt 151.0 lb

## 2014-09-20 DIAGNOSIS — Z720 Tobacco use: Secondary | ICD-10-CM

## 2014-09-20 DIAGNOSIS — I350 Nonrheumatic aortic (valve) stenosis: Secondary | ICD-10-CM | POA: Diagnosis not present

## 2014-09-20 DIAGNOSIS — I25810 Atherosclerosis of coronary artery bypass graft(s) without angina pectoris: Secondary | ICD-10-CM

## 2014-09-20 DIAGNOSIS — E785 Hyperlipidemia, unspecified: Secondary | ICD-10-CM

## 2014-09-20 DIAGNOSIS — I10 Essential (primary) hypertension: Secondary | ICD-10-CM | POA: Diagnosis not present

## 2014-09-20 DIAGNOSIS — I200 Unstable angina: Secondary | ICD-10-CM

## 2014-09-20 DIAGNOSIS — I5022 Chronic systolic (congestive) heart failure: Secondary | ICD-10-CM

## 2014-09-20 DIAGNOSIS — I255 Ischemic cardiomyopathy: Secondary | ICD-10-CM | POA: Diagnosis not present

## 2014-09-20 NOTE — Progress Notes (Signed)
History of Present Illness: 68 y.o. Male with history of CAD s/p CABG in 1993, HTN, HLD, aortic stenosis here today for follow up. Admitted October 2013 and cath performed with occlusion of LAD, patent IMA to LAD, occluded proximal RCA with occluded SVG to PDA, severe disease in left main into Circumflex with occluded vein graft to Circumflex. Re-do CABG suggested but his aorta is severely calcified. He was seen by Dr. Servando Snare with CT surgery and not felt to be a candidate for re-do bypass. Imdur was added. He was admitted 10/14-10/18/14 with a non-STEMI. LHC (07/31/13): mLM 60 then 70, pLAD occluded, mLAD 99 after anastomosis of the graft, pCFX 50, OM stent with 70% ISR, pRCA occluded, SVG-PDA occluded, SVG-OM occluded, LIMA-LAD patent. PCI: Promus premier (2.25 x 20 mm) DES to the mid LAD extending back into the IMA graft. Echo (07/31/13) is: EF 55-60%, mild aortic stenosis (mean gradient 13), mild LAE. Readmitted 02/28/14 with inferolateral STEMI and found to have progression of disease in the left main/proximal Circumflex stent. A 3.0 x 28 mm Xience DES was placed from the Circumflex back into the left main. (The vein graft to the OM is known to be occluded). He did well following the procedure. LVEF 40-45% by echo 03/01/14 with moderate AI, mild AS. Admitted 08/01/14 with chest pain. Cardiac cath per Dr. Ellyn Hack with stenosis left main stent and OM 1. 3.0 x 23 mm Xience DES placed OM1. The left main stent restenosis treated with Siloam balloon. He was treated with IV lasix for volume overload and discharged home on Lasix and aldactone.   He is here today for follow up. No chest pain or SOB. He is at his baseline. Still taking all meds. Still smoking and states that it is the only thing he enjoys in life and will not stop.   Primary Care Physician: None  Last Lipid Profile:Lipid Panel     Component Value Date/Time   CHOL 143 03/01/2014 0129   TRIG 61 03/01/2014 0129   HDL 64 03/01/2014 0129   CHOLHDL 2.2 03/01/2014 0129   VLDL 12 03/01/2014 0129   LDLCALC 67 03/01/2014 0129    Past Medical History  Diagnosis Date  . Hypertension   . Hyperlipoproteinemia   . Aortic stenosis     a. Mod AS/AI by cath 07/2012.;  b.  Echo (07/31/13) is: EF 55-60%, mild aortic stenosis (mean gradient 13);  c. Echo (5/15):  EF 40-45%, mild AS (mean 12 mmHg), mod AI  . Back pain   . Carotid artery occlusion     a. Dopplers 07/2012: no significant high grade obstruction.  . Hyperlipidemia   . Abnormal CT scan, kidney     07/2012 - multiple small cysts  . Cholelithiasis     Seen on CT 07/2012  . Coronary artery disease     a. CABG 10/1991. b. cath 07/2012 with prog dsz, turned down for re-do CABG - for med rx for now.; c. NSTEMI/PCI: DES to mLAD ext into IMA graft;  d. Inf STEMI (5/15):  LM 60-70% then 99% before LAD, pLAD occl, pCFX stent 80+% ISR, oRCA 99% then occl (L-R collats to dRCA), L-LAD ok with patent stent, S-CFX occl (old), S-RCA occl (old); PCI: LM ext into CFX with Xience Alpine DES  . Ischemic cardiomyopathy     a. 2D ECHO: 08/02/2014; EF 45%; severe hypokinesis base/mid-inferolat segments and severe hypokinesis base inferior segment. Mild LVH, mild AS (may be underestimated due to LV dysfunction).  Mean gradient (S): 14 mm Hg. Peak gradient (S): 25 mm Hg. VTI ratio of LVOT to aortic valve: 0.37. Mod LA dilation. Mild RV systolic dysfxn     Past Surgical History  Procedure Laterality Date  . Cardiac catheterization  04/03/99  . Carpal tunnel release  2007    Excision mass dorsal left wrist  . Hernia repair  1988  . Coronary artery bypass graft  1993  . Fasciotomy Right 07/30/2013    Procedure: OPEN FASCIOTOMY RIGHT RING FINGER & RIGHT SMALL FINGER MULTIPLE LEVELS;  Surgeon: Wynonia Sours, MD;  Location: [facility];  Service: Orthopedics;  Laterality: Right;    Current Outpatient Prescriptions  Medication Sig Dispense Refill  . amLODipine-benazepril (LOTREL) 10-40 MG  per capsule Take 1 capsule by mouth daily. 30 capsule 5  . aspirin EC 81 MG tablet Take 81 mg by mouth at bedtime.    . clopidogrel (PLAVIX) 75 MG tablet Take 75 mg by mouth at bedtime.    . clopidogrel (PLAVIX) 75 MG tablet TAKE 1 TABLET (75 MG TOTAL) BY MOUTH DAILY WITH BREAKFAST. 30 tablet 11  . ezetimibe-simvastatin (VYTORIN) 10-40 MG per tablet Take 1 tablet by mouth at bedtime.     . fexofenadine (ALLEGRA) 180 MG tablet Take 180 mg by mouth daily.     . furosemide (LASIX) 40 MG tablet Take 40 mg by mouth daily. Take 1 1/2 tablets daily    . isosorbide mononitrate (IMDUR) 60 MG 24 hr tablet Take 1.5 tablets (90 mg total) by mouth daily. 45 tablet 5  . metoprolol (LOPRESSOR) 50 MG tablet Take 50 mg by mouth 2 (two) times daily.    . nitroGLYCERIN (NITROSTAT) 0.4 MG SL tablet Place 0.4 mg under the tongue every 5 (five) minutes as needed for chest pain.    . Omega-3 Fatty Acids (FISH OIL) 1000 MG CAPS Take 1,000 mg by mouth 2 (two) times daily.      . pantoprazole (PROTONIX) 40 MG tablet Take 40 mg by mouth at bedtime.     . potassium chloride SA (K-DUR,KLOR-CON) 20 MEQ tablet Take 1 tablet (20 mEq total) by mouth daily. (Patient taking differently: Take 10 mEq by mouth daily. ) 30 tablet 5  . spironolactone (ALDACTONE) 25 MG tablet Take 0.5 tablets (12.5 mg total) by mouth 2 (two) times daily. (Patient taking differently: Take 12.5 mg by mouth daily. ) 30 tablet 5   No current facility-administered medications for this visit.    No Known Allergies  History   Social History  . Marital Status: Married    Spouse Name: N/A    Number of Children: 1  . Years of Education: N/A   Occupational History  . Plumbing     Retired in 2007   Social History Main Topics  . Smoking status: Current Some Day Smoker -- 1.00 packs/day for 50 years    Last Attempt to Quit: 08/01/2014  . Smokeless tobacco: Not on file     Comment: has cut back   . Alcohol Use: 8.4 oz/week    14 Cans of beer per week      Comment: daily-6 daily  . Drug Use: No  . Sexual Activity: Not on file   Other Topics Concern  . Not on file   Social History Narrative   Lives in [place] with his family.  Does not routinely exercise.    Family History  Problem Relation Age of Onset  . Heart attack Mother  died @ 17.  Marland Kitchen Hypertension Sister   . Emphysema Brother     Review of Systems:  As stated in the HPI and otherwise negative.   BP 129/60 mmHg  Pulse 54  Ht 5\' 5"  (1.651 m)  Wt 151 lb (68.493 kg)  BMI 25.13 kg/m2  SpO2 97%  Physical Examination: General: Well developed, well nourished, NAD HEENT: OP clear, mucus membranes moist SKIN: warm, dry. No rashes. Neuro: No focal deficits Musculoskeletal: Muscle strength 5/5 all ext Psychiatric: Mood and affect normal Neck: No JVD, no carotid bruits, no thyromegaly, no lymphadenopathy. Lungs:Clear bilaterally, no wheezes, rhonci, crackles Cardiovascular: Regular rate and rhythm. Systolic murmur noted. No gallops or rubs. Abdomen:Soft. Bowel sounds present. Non-tender.  Extremities: No lower extremity edema. Pulses are 1 + in the bilateral DP/PT.  Cardiac cath 02/28/14: Left main: This was a long vessel with near ostial calcification. There was then diffuse 60-70% stenosis commencing in the proximal third and in the most distal aspect. The vessel was focally narrowed to 99% prior to giving rise to the proximal LAD and a large left circumflex vessel per  LAD: The LAD was totally occluded proximally after the takeoff of the first septal perforating artery.  Left circumflex: The left circumflex arose with a 90 angle from the left main. There was a stent in the proximal third of the vessel that had diffuse 80+ percent in-stent stenosis. The circumflex gave rise to a large marginal branch. There were collaterals supplying the distal RCA, PDA, and PLA vessel from the circumflex vessel  Right coronary artery: 99% ostial stenosis and then was totally occluded  proximally. There were left to right collaterals supplying a portion of the distal RCA.  LIMA to LAD: Patent and anastomose it to the middle LAD. The previously placed stent in the LAD beyond the anastomosis was patent.  SVG to circumflex vessel was occluded and this was old.  SVG to RCA was occluded and this was old.  Echo 08/02/14: Left ventricle: Severe hypokinesis base/mid-inferolateral segments and severe hypokinesis base inferior segment. EF is 45%. The cavity size was mildly dilated. Wall thickness was increased in a pattern of mild LVH. - Aortic valve: Leaflets are calcified. Doppler suggests mild AS. The AS may be underestimated because of the LV dysfunction. Mean gradient (S): 14 mm Hg. Peak gradient (S): 25 mm Hg. VTI ratio of LVOT to aortic valve: 0.37. - Left atrium: The atrium was moderately dilated. - Right ventricle: The cavity size was normal. Systolic function was mildly reduced.  Assessment and Plan:   1. CAD: s/p CABG 1993 and most recent cath October 2015 with patent IMA to LAD, occlusion of both vein grafts to the PDA and Circumflex. His left main and proximal Circumflex disease had progressed once again with restenosis left main stent and progression of OM disease. DES placed OM. Left main stent restenosis treated with non-compliant balloon angioplasty. He is not a redo CABG candidate.  Will continue current meds.    2. CAROTID ARTERY DISEASE: The patient had Dopplers done in the fall of 2013 and does not have significant carotid disease   3. Hyperlipidemia: Continue statin. Lipids controlled.   4. Aortic valve disease: Mild AS by echo October 2015. Will follow.    5. HTN: BP controlled. No changes today.   6. Tobacco abuse: Smoking cessation encouraged.   7. Ischemic cardiomyopathy/Chronic systolic CHF: Continue meds including Lasix and aldactone, beta blocker and ARB. Volume status is ok. Last LvEF=45% by echo October 2015.  Check BMET today

## 2014-09-20 NOTE — Patient Instructions (Addendum)
Your physician wants you to follow-up in: 4 months. You will receive a reminder letter in the mail two months in advance. If you don't receive a letter, please call our office to schedule the follow-up appointment.  

## 2014-09-22 ENCOUNTER — Other Ambulatory Visit: Payer: Self-pay | Admitting: Cardiovascular Disease

## 2014-09-22 LAB — BASIC METABOLIC PANEL
BUN: 22 mg/dL (ref 6–23)
CALCIUM: 9.2 mg/dL (ref 8.4–10.5)
CO2: 27 mEq/L (ref 19–32)
CREATININE: 1.2 mg/dL (ref 0.4–1.5)
Chloride: 103 mEq/L (ref 96–112)
GFR: 61.2 mL/min (ref >60.00)
Glucose, Bld: 98 mg/dL (ref 70–99)
Potassium: 4.3 mEq/L (ref 3.5–5.1)
Sodium: 138 mEq/L (ref 135–145)

## 2014-09-26 ENCOUNTER — Encounter (HOSPITAL_COMMUNITY): Payer: Self-pay | Admitting: Cardiology

## 2014-09-26 NOTE — Telephone Encounter (Signed)
Pt was notified on 10/29 of lab results and recommendations by Jenean Lindau, RN

## 2014-09-30 ENCOUNTER — Other Ambulatory Visit: Payer: Self-pay | Admitting: Physician Assistant

## 2014-10-31 ENCOUNTER — Other Ambulatory Visit: Payer: Self-pay | Admitting: Cardiovascular Disease

## 2014-12-03 ENCOUNTER — Other Ambulatory Visit: Payer: Self-pay | Admitting: Cardiovascular Disease

## 2014-12-05 ENCOUNTER — Other Ambulatory Visit: Payer: Self-pay | Admitting: Cardiovascular Disease

## 2015-01-23 ENCOUNTER — Ambulatory Visit (INDEPENDENT_AMBULATORY_CARE_PROVIDER_SITE_OTHER): Payer: Medicare Other | Admitting: Cardiovascular Disease

## 2015-01-23 ENCOUNTER — Encounter: Payer: Self-pay | Admitting: Cardiovascular Disease

## 2015-01-23 ENCOUNTER — Encounter: Payer: Self-pay | Admitting: *Deleted

## 2015-01-23 VITALS — BP 108/52 | HR 52 | Ht 65.0 in | Wt 155.0 lb

## 2015-01-23 DIAGNOSIS — Z720 Tobacco use: Secondary | ICD-10-CM

## 2015-01-23 DIAGNOSIS — I25110 Atherosclerotic heart disease of native coronary artery with unstable angina pectoris: Secondary | ICD-10-CM | POA: Diagnosis not present

## 2015-01-23 DIAGNOSIS — I10 Essential (primary) hypertension: Secondary | ICD-10-CM | POA: Diagnosis not present

## 2015-01-23 DIAGNOSIS — I350 Nonrheumatic aortic (valve) stenosis: Secondary | ICD-10-CM | POA: Diagnosis not present

## 2015-01-23 MED ORDER — FUROSEMIDE 40 MG PO TABS: 40.0000 mg | ORAL_TABLET | Freq: Every day | ORAL | Status: DC

## 2015-01-23 MED ORDER — NITROGLYCERIN 0.4 MG SL SUBL: SUBLINGUAL_TABLET | SUBLINGUAL | Status: DC

## 2015-01-23 NOTE — Progress Notes (Signed)
Chief Complaint  Patient presents with  . Follow-up  . Coronary Artery Disease    History of Present Illness: 69 y.o. Male with history of CAD s/p CABG in 1993, HTN, HLD, aortic stenosis here today for follow up. Admitted October 2013 and cath performed with occlusion of LAD, patent IMA to LAD, occluded proximal RCA with occluded SVG to PDA, severe disease in left main into Circumflex with occluded vein graft to Circumflex. Re-do CABG suggested but his aorta is severely calcified. He was seen by Dr. Servando Snare with CT surgery and not felt to be a candidate for re-do bypass. Imdur was added. He was admitted 10/14-10/18/14 with a non-STEMI. LHC (07/31/13): mLM 60 then 70, pLAD occluded, mLAD 99 after anastomosis of the graft, pCFX 50, OM stent with 70% ISR, pRCA occluded, SVG-PDA occluded, SVG-OM occluded, LIMA-LAD patent. PCI: Promus premier (2.25 x 20 mm) DES to the mid LAD extending back into the IMA graft. Echo (07/31/13) is: EF 55-60%, mild aortic stenosis (mean gradient 13), mild LAE. Readmitted 02/28/14 with inferolateral STEMI and found to have progression of disease in the left main/proximal Circumflex stent. A 3.0 x 28 mm Xience DES was placed from the Circumflex back into the left main. (The vein graft to the OM is known to be occluded). He did well following the procedure. LVEF 40-45% by echo 03/01/14 with moderate AI, mild AS. Admitted 08/01/14 with chest pain. Cardiac cath per Dr. Ellyn Hack with stenosis left main stent and OM 1. 3.0 x 23 mm Xience DES placed OM1. The left main stent restenosis treated with [place] balloon. He was treated with IV lasix for volume overload and discharged home on Lasix and aldactone.   He is here today for follow up. He has had recurrence of chest pain. Feels like prior angina. Occurs every day. Mostly with exertion. Associated with SOB. Still taking all meds. Still smoking and states that it is the only thing he enjoys in life and will not stop. Some dizziness.    Primary Care Physician: None  Last Lipid Profile:Lipid Panel     Component Value Date/Time   CHOL 143 03/01/2014 0129   TRIG 61 03/01/2014 0129   HDL 64 03/01/2014 0129   CHOLHDL 2.2 03/01/2014 0129   VLDL 12 03/01/2014 0129   LDLCALC 67 03/01/2014 0129    Past Medical History  Diagnosis Date  . Hypertension   . Hyperlipoproteinemia   . Aortic stenosis     a. Mod AS/AI by cath 07/2012.;  b.  Echo (07/31/13) is: EF 55-60%, mild aortic stenosis (mean gradient 13);  c. Echo (5/15):  EF 40-45%, mild AS (mean 12 mmHg), mod AI  . Back pain   . Carotid artery occlusion     a. Dopplers 07/2012: no significant high grade obstruction.  . Hyperlipidemia   . Abnormal CT scan, kidney     07/2012 - multiple small cysts  . Cholelithiasis     Seen on CT 07/2012  . Coronary artery disease     a. CABG 10/1991. b. cath 07/2012 with prog dsz, turned down for re-do CABG - for med rx for now.; c. NSTEMI/PCI: DES to mLAD ext into IMA graft;  d. Inf STEMI (5/15):  LM 60-70% then 99% before LAD, pLAD occl, pCFX stent 80+% ISR, oRCA 99% then occl (L-R collats to dRCA), L-LAD ok with patent stent, S-CFX occl (old), S-RCA occl (old); PCI: LM ext into CFX with Xience Alpine DES  . Ischemic cardiomyopathy  a. 2D ECHO: 08/02/2014; EF 45%; severe hypokinesis base/mid-inferolat segments and severe hypokinesis base inferior segment. Mild LVH, mild AS (may be underestimated due to LV dysfunction).  Mean gradient (S): 14 mm Hg. Peak gradient (S): 25 mm Hg. VTI ratio of LVOT to aortic valve: 0.37. Mod LA dilation. Mild RV systolic dysfxn     Past Surgical History  Procedure Laterality Date  . Cardiac catheterization  04/03/99  . Carpal tunnel release  2007    Excision mass dorsal left wrist  . Hernia repair  1988  . Coronary artery bypass graft  1993  . Fasciotomy Right 07/30/2013    Procedure: OPEN FASCIOTOMY RIGHT RING FINGER & RIGHT SMALL FINGER MULTIPLE LEVELS;  Surgeon: Wynonia Sours, MD;  Location:  [place];  Service: Orthopedics;  Laterality: Right;  . Left heart catheterization with coronary/graft angiogram N/A 08/10/2012    Procedure: LEFT HEART CATHETERIZATION WITH Beatrix Fetters;  Surgeon: Hillary Bow, MD;  Location: [facility] CATH LAB;  Service: Cardiovascular;  Laterality: N/A;  . Percutaneous coronary stent intervention (pci-s)  07/31/2013    Procedure: PERCUTANEOUS CORONARY STENT INTERVENTION (PCI-S);  Surgeon: Burnell Blanks, MD;  Location: [facility] CATH LAB;  Service: Cardiovascular;;  LIMA to LAD at the anastomosis with aortic root shot   . Left heart catheterization with coronary angiogram N/A 02/28/2014    Procedure: LEFT HEART CATHETERIZATION WITH CORONARY ANGIOGRAM;  Surgeon: Troy Sine, MD;  Location: [facility] CATH LAB;  Service: Cardiovascular;  Laterality: N/A;  . Left heart catheterization with coronary/graft angiogram N/A 08/01/2014    Procedure: LEFT HEART CATHETERIZATION WITH Beatrix Fetters;  Surgeon: Leonie Man, MD;  Location: [facility] CATH LAB;  Service: Cardiovascular;  Laterality: N/A;    Current Outpatient Prescriptions  Medication Sig Dispense Refill  . amLODipine-benazepril (LOTREL) 5-20 MG per capsule TAKE 2 CAPSULES BY MOUTH EVERY DAY 60 capsule 6  . aspirin EC 81 MG tablet Take 81 mg by mouth at bedtime.    . clopidogrel (PLAVIX) 75 MG tablet TAKE 1 TABLET (75 MG TOTAL) BY MOUTH DAILY WITH BREAKFAST. 30 tablet 11  . ezetimibe-simvastatin (VYTORIN) 10-40 MG per tablet Take 1 tablet by mouth at bedtime.     . fexofenadine (ALLEGRA) 180 MG tablet Take 180 mg by mouth daily.     . furosemide (LASIX) 40 MG tablet Take 40 mg by mouth 2 (two) times daily. Take 2  tablets daily    . isosorbide mononitrate (IMDUR) 60 MG 24 hr tablet Take 1.5 tablets (90 mg total) by mouth daily. 45 tablet 5  . metoprolol (LOPRESSOR) 50 MG tablet Take 50 mg by mouth 2 (two) times daily.    Marland Kitchen NITROSTAT 0.4 MG SL tablet TAKE 1 TABLET UNDER THE TONGUE AS  NEEDEDFOR CHEST PAIN ( MAY REPEAT EVERY 5 MINUTES X 3) 25 tablet 0  . Omega-3 Fatty Acids (FISH OIL) 1000 MG CAPS Take 1,000 mg by mouth 2 (two) times daily.      . pantoprazole (PROTONIX) 40 MG tablet Take 40 mg by mouth at bedtime.     . potassium chloride SA (K-DUR,KLOR-CON) 20 MEQ tablet Take 1 tablet (20 mEq total) by mouth daily. (Patient taking differently: Take 10 mEq by mouth daily. ) 30 tablet 5  . spironolactone (ALDACTONE) 25 MG tablet Take 0.5 tablets (12.5 mg total) by mouth 2 (two) times daily. (Patient taking differently: Take 12.5 mg by mouth daily. ) 30 tablet 5   No current facility-administered medications for this visit.  No Known Allergies  History   Social History  . Marital Status: Married    Spouse Name: N/A  . Number of Children: 1  . Years of Education: N/A   Occupational History  . Plumbing     Retired in 2007   Social History Main Topics  . Smoking status: Current Some Day Smoker -- 1.00 packs/day for 50 years    Last Attempt to Quit: 08/01/2014  . Smokeless tobacco: Not on file     Comment: has cut back   . Alcohol Use: 8.4 oz/week    14 Cans of beer per week     Comment: daily-6 daily  . Drug Use: No  . Sexual Activity: Not on file   Other Topics Concern  . Not on file   Social History Narrative   Lives in [place] with his family.  Does not routinely exercise.    Family History  Problem Relation Age of Onset  . Heart attack Mother     died @ 60.  Marland Kitchen Hypertension Sister   . Emphysema Brother   . Stroke Neg Hx   . Hypertension Mother   . Hypertension Father   . Hypertension Brother     Review of Systems:  As stated in the HPI and otherwise negative.   BP 108/52 mmHg  Pulse 52  Ht 5\' 5"  (1.651 m)  Wt 155 lb (70.308 kg)  BMI 25.79 kg/m2  Physical Examination: General: Well developed, well nourished, NAD HEENT: OP clear, mucus membranes moist SKIN: warm, dry. No rashes. Neuro: No focal deficits Musculoskeletal: Muscle  strength 5/5 all ext Psychiatric: Mood and affect normal Neck: No JVD, no carotid bruits, no thyromegaly, no lymphadenopathy. Lungs:Clear bilaterally, no wheezes, rhonci, crackles Cardiovascular: Regular rate and rhythm. Systolic murmur noted. No gallops or rubs. Abdomen:Soft. Bowel sounds present. Non-tender.  Extremities: No lower extremity edema. Pulses are 1 + in the bilateral DP/PT.  Cardiac cath 02/28/14: Left main: This was a long vessel with near ostial calcification. There was then diffuse 60-70% stenosis commencing in the proximal third and in the most distal aspect. The vessel was focally narrowed to 99% prior to giving rise to the proximal LAD and a large left circumflex vessel per  LAD: The LAD was totally occluded proximally after the takeoff of the first septal perforating artery.  Left circumflex: The left circumflex arose with a 90 angle from the left main. There was a stent in the proximal third of the vessel that had diffuse 80+ percent in-stent stenosis. The circumflex gave rise to a large marginal branch. There were collaterals supplying the distal RCA, PDA, and PLA vessel from the circumflex vessel  Right coronary artery: 99% ostial stenosis and then was totally occluded proximally. There were left to right collaterals supplying a portion of the distal RCA.  LIMA to LAD: Patent and anastomose it to the middle LAD. The previously placed stent in the LAD beyond the anastomosis was patent.  SVG to circumflex vessel was occluded and this was old.  SVG to RCA was occluded and this was old.  Echo 08/02/14: Left ventricle: Severe hypokinesis base/mid-inferolateral segments and severe hypokinesis base inferior segment. EF is 45%. The cavity size was mildly dilated. Wall thickness was increased in a pattern of mild LVH. - Aortic valve: Leaflets are calcified. Doppler suggests mild AS. The AS may be underestimated because of the LV dysfunction. Mean gradient (S): 14 mm  Hg. Peak gradient (S): 25 mm Hg. VTI ratio of LVOT to aortic  valve: 0.37. - Left atrium: The atrium was moderately dilated. - Right ventricle: The cavity size was normal. Systolic function was mildly reduced.  EKG:  EKG is ordered today. The ekg ordered today demonstrates Sinus brady, rate 52 bpm. 1st degree AV block.   Recent Labs: 08/02/2014: ALT 20 08/07/2014: Hemoglobin 14.5; Platelets 224 08/13/2014: Pro B Natriuretic peptide (BNP) 410.0* 09/20/2014: BUN 22; Creatinine 1.2; Potassium 4.3; Sodium 138   Lipid Panel    Component Value Date/Time   CHOL 143 03/01/2014 0129   TRIG 61 03/01/2014 0129   HDL 64 03/01/2014 0129   CHOLHDL 2.2 03/01/2014 0129   VLDL 12 03/01/2014 0129   LDLCALC 67 03/01/2014 0129     Wt Readings from Last 3 Encounters:  01/23/15 155 lb (70.308 kg)  09/20/14 151 lb (68.493 kg)  08/13/14 150 lb (68.04 kg)     Other studies Reviewed: Additional studies/ records that were reviewed today include:  Review of the above records demonstrates:    Assessment and Plan:   1. CAD/Unstable angina: Chest pain is similar to prior unstable angina. He is s/p CABG 1993 and most recent cath October 2015 with patent IMA to LAD, occlusion of both vein grafts to the PDA and Circumflex. His left main and proximal Circumflex disease had progressed once again with restenosis left main stent and progression of OM disease. DES placed OM. Left main stent restenosis treated with non-compliant balloon angioplasty. He is not a redo CABG candidate. Will plan repeat cath on 02/12/15. He cannot schedule this any sooner. Continue current meds.   2. CAROTID ARTERY DISEASE: The patient had Dopplers done in the fall of 2013 and does not have significant carotid disease   3. Hyperlipidemia: Continue statin. Lipids controlled.   4. Aortic valve disease: Mild AS by echo October 2015. Will follow.    5. HTN: BP controlled.   6. Tobacco abuse: Smoking cessation encouraged. He does  not wish to stop  7. Ischemic cardiomyopathy/Chronic systolic CHF: Continue meds including Lasix and aldactone, beta blocker and ARB. Volume status is ok. Last LvEF=45% by echo October 2015. With recent dizziness will cut Lasix back to 40 mg daily  Current medicines are reviewed at length with the patient today.  The patient does not have concerns regarding medicines.  The following changes have been made:  Lasix dose reduced to 40 mg daily  Labs/ tests ordered today include:  No orders of the defined types were placed in this encounter.    Disposition:   FU with me in 1 month  Signed, Lauree Chandler, MD 01/23/2015 10:51 AM    [facility] [address], [place], [place]  [address] Phone: [phone]; Fax: [phone]

## 2015-01-23 NOTE — Patient Instructions (Addendum)
Medication Instructions:  Your physician has recommended you make the following change in your medication: Decrease furosemide to 40 mg by mouth daily   Labwork:  Your physician recommends that you return for lab work  --Scheduled for February 10, 2015.  The lab opens at 7:30 AM   Testing/Procedures:  Cardiac Catheterization scheduled for February 12, 2015  Follow-Up: Your physician recommends that you schedule a follow-up appointment in: about 6 weeks with NP or PA. Scheduled for Mar 10, 2015 at 11:10 with Richardson Dopp, PA.     Your physician has requested that you have a cardiac catheterization. Cardiac catheterization is used to diagnose and/or treat various heart conditions. Doctors may recommend this procedure for a number of different reasons. The most common reason is to evaluate chest pain. Chest pain can be a symptom of coronary artery disease (CAD), and cardiac catheterization can show whether plaque is narrowing or blocking your heart's arteries. This procedure is also used to evaluate the valves, as well as measure the blood flow and oxygen levels in different parts of your heart. For further information please visit [email or web address]. Please follow instruction sheet, as given.

## 2015-02-09 ENCOUNTER — Other Ambulatory Visit: Payer: Self-pay | Admitting: Cardiology

## 2015-02-10 ENCOUNTER — Other Ambulatory Visit (INDEPENDENT_AMBULATORY_CARE_PROVIDER_SITE_OTHER): Payer: Medicare Other | Admitting: *Deleted

## 2015-02-10 DIAGNOSIS — I25110 Atherosclerotic heart disease of native coronary artery with unstable angina pectoris: Secondary | ICD-10-CM

## 2015-02-10 LAB — CBC WITH DIFFERENTIAL/PLATELET
Basophils Absolute: 0 10*3/uL (ref 0.0–0.1)
Basophils Relative: 0.3 % (ref 0.0–3.0)
Eosinophils Absolute: 0.2 10*3/uL (ref 0.0–0.7)
Eosinophils Relative: 1.5 % (ref 0.0–5.0)
HCT: 40.5 % (ref 39.0–52.0)
Hemoglobin: 13.6 g/dL (ref 13.0–17.0)
Lymphocytes Relative: 19.3 % (ref 12.0–46.0)
Lymphs Abs: 2.2 10*3/uL (ref 0.7–4.0)
MCHC: 33.5 g/dL (ref 30.0–36.0)
MCV: 95 fl (ref 78.0–100.0)
MONO ABS: 1 10*3/uL (ref 0.1–1.0)
Monocytes Relative: 8.4 % (ref 3.0–12.0)
NEUTROS ABS: 8.2 10*3/uL — AB (ref 1.4–7.7)
Neutrophils Relative %: 70.5 % (ref 43.0–77.0)
Platelets: 225 10*3/uL (ref 150.0–400.0)
RBC: 4.26 Mil/uL (ref 4.22–5.81)
RDW: 13.3 % (ref 11.5–15.5)
WBC: 11.6 10*3/uL — ABNORMAL HIGH (ref 4.0–10.5)

## 2015-02-10 LAB — BASIC METABOLIC PANEL
BUN: 24 mg/dL — AB (ref 6–23)
CO2: 29 mEq/L (ref 19–32)
Calcium: 9.5 mg/dL (ref 8.4–10.5)
Chloride: 102 mEq/L (ref 96–112)
Creatinine, Ser: 1.3 mg/dL (ref 0.40–1.50)
GFR: 57.88 mL/min — ABNORMAL LOW (ref >60.00)
Glucose, Bld: 97 mg/dL (ref 70–99)
Potassium: 5 mEq/L (ref 3.5–5.1)
Sodium: 136 mEq/L (ref 135–145)

## 2015-02-10 LAB — PROTIME-INR
INR: 1 ratio (ref 0.8–1.0)
Prothrombin Time: 11 s (ref 9.6–13.1)

## 2015-02-10 NOTE — Addendum Note (Signed)
Addended by: Eulis Foster on: 02/10/2015 10:04 AM   Modules accepted: Orders

## 2015-02-12 ENCOUNTER — Encounter (HOSPITAL_COMMUNITY)
Admission: RE | Disposition: A | Discharge status: Home / Self Care | Payer: Self-pay | Source: Ambulatory Visit | Attending: Cardiovascular Disease

## 2015-02-12 ENCOUNTER — Ambulatory Visit (HOSPITAL_COMMUNITY)
Admission: RE | Admit: 2015-02-12 | Discharge: 2015-02-12 | Disposition: A | Discharge status: Home / Self Care | Payer: Medicare Other | Source: Ambulatory Visit | Attending: Cardiovascular Disease | Admitting: Cardiovascular Disease

## 2015-02-12 ENCOUNTER — Encounter (HOSPITAL_COMMUNITY): Payer: Self-pay | Admitting: Cardiovascular Disease

## 2015-02-12 DIAGNOSIS — I519 Heart disease, unspecified: Secondary | ICD-10-CM | POA: Diagnosis not present

## 2015-02-12 DIAGNOSIS — I25810 Atherosclerosis of coronary artery bypass graft(s) without angina pectoris: Secondary | ICD-10-CM | POA: Insufficient documentation

## 2015-02-12 DIAGNOSIS — E785 Hyperlipidemia, unspecified: Secondary | ICD-10-CM | POA: Diagnosis not present

## 2015-02-12 DIAGNOSIS — Z7982 Long term (current) use of aspirin: Secondary | ICD-10-CM | POA: Insufficient documentation

## 2015-02-12 DIAGNOSIS — I255 Ischemic cardiomyopathy: Secondary | ICD-10-CM | POA: Diagnosis not present

## 2015-02-12 DIAGNOSIS — Z955 Presence of coronary angioplasty implant and graft: Secondary | ICD-10-CM | POA: Diagnosis not present

## 2015-02-12 DIAGNOSIS — I350 Nonrheumatic aortic (valve) stenosis: Secondary | ICD-10-CM | POA: Insufficient documentation

## 2015-02-12 DIAGNOSIS — Z951 Presence of aortocoronary bypass graft: Secondary | ICD-10-CM | POA: Insufficient documentation

## 2015-02-12 DIAGNOSIS — I10 Essential (primary) hypertension: Secondary | ICD-10-CM | POA: Diagnosis not present

## 2015-02-12 DIAGNOSIS — I2510 Atherosclerotic heart disease of native coronary artery without angina pectoris: Secondary | ICD-10-CM | POA: Diagnosis not present

## 2015-02-12 DIAGNOSIS — I25110 Atherosclerotic heart disease of native coronary artery with unstable angina pectoris: Secondary | ICD-10-CM | POA: Diagnosis not present

## 2015-02-12 DIAGNOSIS — I2582 Chronic total occlusion of coronary artery: Secondary | ICD-10-CM | POA: Diagnosis not present

## 2015-02-12 DIAGNOSIS — I252 Old myocardial infarction: Secondary | ICD-10-CM | POA: Insufficient documentation

## 2015-02-12 DIAGNOSIS — I6529 Occlusion and stenosis of unspecified carotid artery: Secondary | ICD-10-CM | POA: Insufficient documentation

## 2015-02-12 DIAGNOSIS — F17210 Nicotine dependence, cigarettes, uncomplicated: Secondary | ICD-10-CM | POA: Insufficient documentation

## 2015-02-12 DIAGNOSIS — Z9049 Acquired absence of other specified parts of digestive tract: Secondary | ICD-10-CM | POA: Insufficient documentation

## 2015-02-12 HISTORY — PX: LEFT HEART CATHETERIZATION WITH CORONARY/GRAFT ANGIOGRAM: SHX5450

## 2015-02-12 SURGERY — LEFT HEART CATHETERIZATION WITH CORONARY/GRAFT ANGIOGRAM
Anesthesia: LOCAL

## 2015-02-12 MED ORDER — SODIUM CHLORIDE 0.9 % IV SOLN: INTRAVENOUS | Status: AC

## 2015-02-12 MED ORDER — FENTANYL CITRATE (PF) 100 MCG/2ML IJ SOLN
INTRAMUSCULAR | Status: AC
  Filled 2015-02-12: qty 2

## 2015-02-12 MED ORDER — MIDAZOLAM HCL 2 MG/2ML IJ SOLN
INTRAMUSCULAR | Status: AC
  Filled 2015-02-12: qty 2

## 2015-02-12 MED ORDER — SODIUM CHLORIDE 0.9 % IJ SOLN: 3.0000 mL | Freq: Two times a day (BID) | INTRAMUSCULAR | Status: DC

## 2015-02-12 MED ORDER — HEPARIN (PORCINE) IN NACL 2-0.9 UNIT/ML-% IJ SOLN
INTRAMUSCULAR | Status: AC
  Filled 2015-02-12: qty 1500

## 2015-02-12 MED ORDER — SODIUM CHLORIDE 0.9 % IJ SOLN: 3.0000 mL | INTRAMUSCULAR | Status: DC | PRN

## 2015-02-12 MED ORDER — HEPARIN SODIUM (PORCINE) 1000 UNIT/ML IJ SOLN
INTRAMUSCULAR | Status: AC
  Filled 2015-02-12: qty 1

## 2015-02-12 MED ORDER — SODIUM CHLORIDE 0.9 % IV SOLN: 250.0000 mL | INTRAVENOUS | Status: DC | PRN

## 2015-02-12 MED ORDER — VERAPAMIL HCL 2.5 MG/ML IV SOLN
INTRAVENOUS | Status: AC
  Filled 2015-02-12: qty 2

## 2015-02-12 MED ORDER — SODIUM CHLORIDE 0.9 % IV SOLN
INTRAVENOUS | Status: DC
  Administered 2015-02-12: 09:00:00 via INTRAVENOUS

## 2015-02-12 MED ORDER — ASPIRIN 81 MG PO CHEW: 81.0000 mg | CHEWABLE_TABLET | ORAL | Status: DC

## 2015-02-12 MED ORDER — NITROGLYCERIN 1 MG/10 ML FOR IR/CATH LAB
INTRA_ARTERIAL | Status: AC
  Filled 2015-02-12: qty 10

## 2015-02-12 MED ORDER — LIDOCAINE HCL (PF) 1 % IJ SOLN
INTRAMUSCULAR | Status: AC
  Filled 2015-02-12: qty 30

## 2015-02-12 NOTE — Progress Notes (Signed)
TR BAND REMOVAL  LOCATION:    right radial  DEFLATED PER PROTOCOL:    Yes.    TIME BAND OFF / DRESSING APPLIED:    1230   SITE UPON ARRIVAL:    Level 0  SITE AFTER BAND REMOVAL:    Level 0  CIRCULATION SENSATION AND MOVEMENT:    Within Normal Limits   Yes.    COMMENTS:   TRB REMOVED/ TEGADERM DSG APPLIED

## 2015-02-12 NOTE — Discharge Instructions (Signed)
Radial Site Care °Refer to this sheet in the next few weeks. These instructions provide you with information on caring for yourself after your procedure. Your caregiver may also give you more specific instructions. Your treatment has been planned according to current medical practices, but problems sometimes occur. Call your caregiver if you have any problems or questions after your procedure. °HOME CARE INSTRUCTIONS °· You may shower the day after the procedure. Remove the bandage (dressing) and gently wash the site with plain soap and water. Gently pat the site dry. °· Do not apply powder or lotion to the site. °· Do not submerge the affected site in water for 3 to 5 days. °· Inspect the site at least twice daily. °· Do not flex or bend the affected arm for 24 hours. °· No lifting over 5 pounds (2.3 kg) for 5 days after your procedure. °· Do not drive home if you are discharged the same day of the procedure. Have someone else drive you. °· You may drive 24 hours after the procedure unless otherwise instructed by your caregiver. °· Do not operate machinery or power tools for 24 hours. °· A responsible adult should be with you for the first 24 hours after you arrive home. °What to expect: °· Any bruising will usually fade within 1 to 2 weeks. °· Blood that collects in the tissue (hematoma) may be painful to the touch. It should usually decrease in size and tenderness within 1 to 2 weeks. °SEEK IMMEDIATE MEDICAL CARE IF: °· You have unusual pain at the radial site. °· You have redness, warmth, swelling, or pain at the radial site. °· You have drainage (other than a small amount of blood on the dressing). °· You have chills. °· You have a fever or persistent symptoms for more than 72 hours. °· You have a fever and your symptoms suddenly get worse. °· Your arm becomes pale, cool, tingly, or numb. °· You have heavy bleeding from the site. Hold pressure on the site. °Document Released: 11/06/2010 Document Revised:  12/27/2011 Document Reviewed: 11/06/2010 °[employer]® Patient Information ©2015 [employer]. This information is not intended to replace advice given to you by your health care provider. Make sure you discuss any questions you have with your health care provider. ° °

## 2015-02-12 NOTE — H&P (View-Only) (Signed)
Chief Complaint  Patient presents with  . Follow-up  . Coronary Artery Disease    History of Present Illness: 69 y.o. Male with history of CAD s/p CABG in 1993, HTN, HLD, aortic stenosis here today for follow up. Admitted October 2013 and cath performed with occlusion of LAD, patent IMA to LAD, occluded proximal RCA with occluded SVG to PDA, severe disease in left main into Circumflex with occluded vein graft to Circumflex. Re-do CABG suggested but his aorta is severely calcified. He was seen by Dr. Servando Snare with CT surgery and not felt to be a candidate for re-do bypass. Imdur was added. He was admitted 10/14-10/18/14 with a non-STEMI. LHC (07/31/13): mLM 60 then 70, pLAD occluded, mLAD 99 after anastomosis of the graft, pCFX 50, OM stent with 70% ISR, pRCA occluded, SVG-PDA occluded, SVG-OM occluded, LIMA-LAD patent. PCI: Promus premier (2.25 x 20 mm) DES to the mid LAD extending back into the IMA graft. Echo (07/31/13) is: EF 55-60%, mild aortic stenosis (mean gradient 13), mild LAE. Readmitted 02/28/14 with inferolateral STEMI and found to have progression of disease in the left main/proximal Circumflex stent. A 3.0 x 28 mm Xience DES was placed from the Circumflex back into the left main. (The vein graft to the OM is known to be occluded). He did well following the procedure. LVEF 40-45% by echo 03/01/14 with moderate AI, mild AS. Admitted 08/01/14 with chest pain. Cardiac cath per Dr. Ellyn Hack with stenosis left main stent and OM 1. 3.0 x 23 mm Xience DES placed OM1. The left main stent restenosis treated with [place] balloon. He was treated with IV lasix for volume overload and discharged home on Lasix and aldactone.   He is here today for follow up. He has had recurrence of chest pain. Feels like prior angina. Occurs every day. Mostly with exertion. Associated with SOB. Still taking all meds. Still smoking and states that it is the only thing he enjoys in life and will not stop. Some dizziness.    Primary Care Physician: None  Last Lipid Profile:Lipid Panel     Component Value Date/Time   CHOL 143 03/01/2014 0129   TRIG 61 03/01/2014 0129   HDL 64 03/01/2014 0129   CHOLHDL 2.2 03/01/2014 0129   VLDL 12 03/01/2014 0129   LDLCALC 67 03/01/2014 0129    Past Medical History  Diagnosis Date  . Hypertension   . Hyperlipoproteinemia   . Aortic stenosis     a. Mod AS/AI by cath 07/2012.;  b.  Echo (07/31/13) is: EF 55-60%, mild aortic stenosis (mean gradient 13);  c. Echo (5/15):  EF 40-45%, mild AS (mean 12 mmHg), mod AI  . Back pain   . Carotid artery occlusion     a. Dopplers 07/2012: no significant high grade obstruction.  . Hyperlipidemia   . Abnormal CT scan, kidney     07/2012 - multiple small cysts  . Cholelithiasis     Seen on CT 07/2012  . Coronary artery disease     a. CABG 10/1991. b. cath 07/2012 with prog dsz, turned down for re-do CABG - for med rx for now.; c. NSTEMI/PCI: DES to mLAD ext into IMA graft;  d. Inf STEMI (5/15):  LM 60-70% then 99% before LAD, pLAD occl, pCFX stent 80+% ISR, oRCA 99% then occl (L-R collats to dRCA), L-LAD ok with patent stent, S-CFX occl (old), S-RCA occl (old); PCI: LM ext into CFX with Xience Alpine DES  . Ischemic cardiomyopathy  a. 2D ECHO: 08/02/2014; EF 45%; severe hypokinesis base/mid-inferolat segments and severe hypokinesis base inferior segment. Mild LVH, mild AS (may be underestimated due to LV dysfunction).  Mean gradient (S): 14 mm Hg. Peak gradient (S): 25 mm Hg. VTI ratio of LVOT to aortic valve: 0.37. Mod LA dilation. Mild RV systolic dysfxn     Past Surgical History  Procedure Laterality Date  . Cardiac catheterization  04/03/99  . Carpal tunnel release  2007    Excision mass dorsal left wrist  . Hernia repair  1988  . Coronary artery bypass graft  1993  . Fasciotomy Right 07/30/2013    Procedure: OPEN FASCIOTOMY RIGHT RING FINGER & RIGHT SMALL FINGER MULTIPLE LEVELS;  Surgeon: Wynonia Sours, MD;  Location:  [facility];  Service: Orthopedics;  Laterality: Right;  . Left heart catheterization with coronary/graft angiogram N/A 08/10/2012    Procedure: LEFT HEART CATHETERIZATION WITH Beatrix Fetters;  Surgeon: Hillary Bow, MD;  Location: [facility] CATH LAB;  Service: Cardiovascular;  Laterality: N/A;  . Percutaneous coronary stent intervention (pci-s)  07/31/2013    Procedure: PERCUTANEOUS CORONARY STENT INTERVENTION (PCI-S);  Surgeon: Burnell Blanks, MD;  Location: [facility] CATH LAB;  Service: Cardiovascular;;  LIMA to LAD at the anastomosis with aortic root shot   . Left heart catheterization with coronary angiogram N/A 02/28/2014    Procedure: LEFT HEART CATHETERIZATION WITH CORONARY ANGIOGRAM;  Surgeon: Troy Sine, MD;  Location: [facility] CATH LAB;  Service: Cardiovascular;  Laterality: N/A;  . Left heart catheterization with coronary/graft angiogram N/A 08/01/2014    Procedure: LEFT HEART CATHETERIZATION WITH Beatrix Fetters;  Surgeon: Leonie Man, MD;  Location: [facility] CATH LAB;  Service: Cardiovascular;  Laterality: N/A;    Current Outpatient Prescriptions  Medication Sig Dispense Refill  . amLODipine-benazepril (LOTREL) 5-20 MG per capsule TAKE 2 CAPSULES BY MOUTH EVERY DAY 60 capsule 6  . aspirin EC 81 MG tablet Take 81 mg by mouth at bedtime.    . clopidogrel (PLAVIX) 75 MG tablet TAKE 1 TABLET (75 MG TOTAL) BY MOUTH DAILY WITH BREAKFAST. 30 tablet 11  . ezetimibe-simvastatin (VYTORIN) 10-40 MG per tablet Take 1 tablet by mouth at bedtime.     . fexofenadine (ALLEGRA) 180 MG tablet Take 180 mg by mouth daily.     . furosemide (LASIX) 40 MG tablet Take 40 mg by mouth 2 (two) times daily. Take 2  tablets daily    . isosorbide mononitrate (IMDUR) 60 MG 24 hr tablet Take 1.5 tablets (90 mg total) by mouth daily. 45 tablet 5  . metoprolol (LOPRESSOR) 50 MG tablet Take 50 mg by mouth 2 (two) times daily.    Marland Kitchen NITROSTAT 0.4 MG SL tablet TAKE 1 TABLET UNDER THE TONGUE AS  NEEDEDFOR CHEST PAIN ( MAY REPEAT EVERY 5 MINUTES X 3) 25 tablet 0  . Omega-3 Fatty Acids (FISH OIL) 1000 MG CAPS Take 1,000 mg by mouth 2 (two) times daily.      . pantoprazole (PROTONIX) 40 MG tablet Take 40 mg by mouth at bedtime.     . potassium chloride SA (K-DUR,KLOR-CON) 20 MEQ tablet Take 1 tablet (20 mEq total) by mouth daily. (Patient taking differently: Take 10 mEq by mouth daily. ) 30 tablet 5  . spironolactone (ALDACTONE) 25 MG tablet Take 0.5 tablets (12.5 mg total) by mouth 2 (two) times daily. (Patient taking differently: Take 12.5 mg by mouth daily. ) 30 tablet 5   No current facility-administered medications for this visit.  No Known Allergies  History   Social History  . Marital Status: Married    Spouse Name: N/A  . Number of Children: 1  . Years of Education: N/A   Occupational History  . Plumbing     Retired in 2007   Social History Main Topics  . Smoking status: Current Some Day Smoker -- 1.00 packs/day for 50 years    Last Attempt to Quit: 08/01/2014  . Smokeless tobacco: Not on file     Comment: has cut back   . Alcohol Use: 8.4 oz/week    14 Cans of beer per week     Comment: daily-6 daily  . Drug Use: No  . Sexual Activity: Not on file   Other Topics Concern  . Not on file   Social History Narrative   Lives in [place] with his family.  Does not routinely exercise.    Family History  Problem Relation Age of Onset  . Heart attack Mother     died @ 15.  Marland Kitchen Hypertension Sister   . Emphysema Brother   . Stroke Neg Hx   . Hypertension Mother   . Hypertension Father   . Hypertension Brother     Review of Systems:  As stated in the HPI and otherwise negative.   BP 108/52 mmHg  Pulse 52  Ht 5\' 5"  (1.651 m)  Wt 155 lb (70.308 kg)  BMI 25.79 kg/m2  Physical Examination: General: Well developed, well nourished, NAD HEENT: OP clear, mucus membranes moist SKIN: warm, dry. No rashes. Neuro: No focal deficits Musculoskeletal: Muscle  strength 5/5 all ext Psychiatric: Mood and affect normal Neck: No JVD, no carotid bruits, no thyromegaly, no lymphadenopathy. Lungs:Clear bilaterally, no wheezes, rhonci, crackles Cardiovascular: Regular rate and rhythm. Systolic murmur noted. No gallops or rubs. Abdomen:Soft. Bowel sounds present. Non-tender.  Extremities: No lower extremity edema. Pulses are 1 + in the bilateral DP/PT.  Cardiac cath 02/28/14: Left main: This was a long vessel with near ostial calcification. There was then diffuse 60-70% stenosis commencing in the proximal third and in the most distal aspect. The vessel was focally narrowed to 99% prior to giving rise to the proximal LAD and a large left circumflex vessel per  LAD: The LAD was totally occluded proximally after the takeoff of the first septal perforating artery.  Left circumflex: The left circumflex arose with a 90 angle from the left main. There was a stent in the proximal third of the vessel that had diffuse 80+ percent in-stent stenosis. The circumflex gave rise to a large marginal branch. There were collaterals supplying the distal RCA, PDA, and PLA vessel from the circumflex vessel  Right coronary artery: 99% ostial stenosis and then was totally occluded proximally. There were left to right collaterals supplying a portion of the distal RCA.  LIMA to LAD: Patent and anastomose it to the middle LAD. The previously placed stent in the LAD beyond the anastomosis was patent.  SVG to circumflex vessel was occluded and this was old.  SVG to RCA was occluded and this was old.  Echo 08/02/14: Left ventricle: Severe hypokinesis base/mid-inferolateral segments and severe hypokinesis base inferior segment. EF is 45%. The cavity size was mildly dilated. Wall thickness was increased in a pattern of mild LVH. - Aortic valve: Leaflets are calcified. Doppler suggests mild AS. The AS may be underestimated because of the LV dysfunction. Mean gradient (S): 14 mm  Hg. Peak gradient (S): 25 mm Hg. VTI ratio of LVOT to aortic  valve: 0.37. - Left atrium: The atrium was moderately dilated. - Right ventricle: The cavity size was normal. Systolic function was mildly reduced.  EKG:  EKG is ordered today. The ekg ordered today demonstrates Sinus brady, rate 52 bpm. 1st degree AV block.   Recent Labs: 08/02/2014: ALT 20 08/07/2014: Hemoglobin 14.5; Platelets 224 08/13/2014: Pro B Natriuretic peptide (BNP) 410.0* 09/20/2014: BUN 22; Creatinine 1.2; Potassium 4.3; Sodium 138   Lipid Panel    Component Value Date/Time   CHOL 143 03/01/2014 0129   TRIG 61 03/01/2014 0129   HDL 64 03/01/2014 0129   CHOLHDL 2.2 03/01/2014 0129   VLDL 12 03/01/2014 0129   LDLCALC 67 03/01/2014 0129     Wt Readings from Last 3 Encounters:  01/23/15 155 lb (70.308 kg)  09/20/14 151 lb (68.493 kg)  08/13/14 150 lb (68.04 kg)     Other studies Reviewed: Additional studies/ records that were reviewed today include:  Review of the above records demonstrates:    Assessment and Plan:   1. CAD/Unstable angina: Chest pain is similar to prior unstable angina. He is s/p CABG 1993 and most recent cath October 2015 with patent IMA to LAD, occlusion of both vein grafts to the PDA and Circumflex. His left main and proximal Circumflex disease had progressed once again with restenosis left main stent and progression of OM disease. DES placed OM. Left main stent restenosis treated with non-compliant balloon angioplasty. He is not a redo CABG candidate. Will plan repeat cath on 02/12/15. He cannot schedule this any sooner. Continue current meds.   2. CAROTID ARTERY DISEASE: The patient had Dopplers done in the fall of 2013 and does not have significant carotid disease   3. Hyperlipidemia: Continue statin. Lipids controlled.   4. Aortic valve disease: Mild AS by echo October 2015. Will follow.    5. HTN: BP controlled.   6. Tobacco abuse: Smoking cessation encouraged. He does  not wish to stop  7. Ischemic cardiomyopathy/Chronic systolic CHF: Continue meds including Lasix and aldactone, beta blocker and ARB. Volume status is ok. Last LvEF=45% by echo October 2015. With recent dizziness will cut Lasix back to 40 mg daily  Current medicines are reviewed at length with the patient today.  The patient does not have concerns regarding medicines.  The following changes have been made:  Lasix dose reduced to 40 mg daily  Labs/ tests ordered today include:  No orders of the defined types were placed in this encounter.    Disposition:   FU with me in 1 month  Signed, Lauree Chandler, MD 01/23/2015 10:51 AM    [facility] [address], [place], [place]  [address] Phone: [phone]; Fax: [phone]

## 2015-02-12 NOTE — CV Procedure (Addendum)
Cardiac Catheterization Operative Report  Steve Durham [id number] 4/27/20169:51 AM No PCP Per Patient  Procedure Performed:  1. Left Heart Catheterization 2. Selective Coronary Angiography 3. Left ventricular angiogram 4. LIMA graft angiography  Operator: Lauree Chandler, MD  Arterial access site:  Left radial artery.   Indication: 69 y.o.male with history of CAD s/p CABG in 1993, HTN, HLD, aortic stenosis with recent recurrent chest pain c/w unstable angina.   He was admitted October 2013 and cath performed with occlusion of LAD, patent IMA to LAD, occluded proximal RCA with occluded SVG to PDA, severe disease in left main into Circumflex with occluded vein graft to Circumflex. Re-do CABG suggested but his aorta is severely calcified. He was seen by Dr. Servando Snare with CT surgery and not felt to be a candidate for re-do bypass. Imdur was added. He was admitted 10/14-10/18/14 with a non-STEMI. LHC (07/31/13): mLM 60 then 70, pLAD occluded, mLAD 99 after anastomosis of the graft, pCFX 50, OM stent with 70% ISR, pRCA occluded, SVG-PDA occluded, SVG-OM occluded, LIMA-LAD patent. PCI: Promus premier (2.25 x 20 mm) DES to the mid LAD extending back into the IMA graft. Echo (07/31/13) is: EF 55-60%, mild aortic stenosis (mean gradient 13), mild LAE. Readmitted 02/28/14 with inferolateral STEMI and found to have progression of disease in the left main/proximal Circumflex stent. A 3.0 x 28 mm Xience DES was placed from the Circumflex back into the left main. (The vein graft to the OM is known to be occluded). He did well following the procedure. LVEF 40-45% by echo 03/01/14 with moderate AI, mild AS. Admitted 08/01/14 with chest pain. Cardiac cath per Dr. Ellyn Hack with stenosis left main stent and OM 1. 3.0 x 23 mm Xience DES placed OM1. The left main stent restenosis treated with [place] balloon. He was treated with IV lasix for volume overload and discharged home on Lasix and aldactone. Now  recurrent symptoms c/w unstable angina so cath is planned today.                                        Procedure Details: The risks, benefits, complications, treatment options, and expected outcomes were discussed with the patient. The patient and/or family concurred with the proposed plan, giving informed consent. The patient was brought to the cath lab after IV hydration was begun and oral premedication was given. The patient was further sedated with Versed and Fentanyl. The left wrist was assessed with a modified Allens test which was positive. The left wrist was prepped and draped in a sterile fashion. 1% lidocaine was used for local anesthesia. Using the modified Seldinger access technique, a 5 French sheath was placed in the Left radial artery. 3 mg Verapamil was given through the sheath. 4000 units IV heparin was given. I engaged the LIMA graft with an IMA catheter and performed selective angiography of the graft. I then advanced the wire into the ascending aorta and used a JL4 catheter to engage the left main. Selective angiography was performed. A pigtail catheter was used to perform a left ventricular angiogram.   The sheath was removed from the left radial artery and a Terumo hemostasis band was applied at the arteriotomy site on the left wrist. There were no immediate complications. The patient was taken to the recovery area in stable condition.   EBL: minimal  Hemodynamic Findings: Central aortic pressure: 124/53 Left  ventricular pressure: 135/6/27  Angiographic Findings:  Left main: Long segment with stent noted throughout the vessel. The stented segment is patent with mild diffuse 20% restenosis.   Left Anterior Descending Artery: Large caliber vessel that courses to the apex. There is 100% ostial occlusion. The mid and distal vessel fills from the patent IMA graft.   Circumflex Artery: Large caliber vessel with stent extending from the ostium into the OM1. There is diffuse 30%  restenosis in the stented segment. The mid and distal segment of the has mild diffuse disease. The AV groove Circumflex is occluded and fills from left to left collaterals.   Right Coronary Artery: Known to be occluded at the ostium. (not engaged). The distal vessel fills from left to right collaterals supplied by the Circumflex and the LAD.   Graft Anatomy:  SVG to PDA is known to be occluded and is not selectively engaged.  SVG to OM is known to be occluded and is not selectively engaged.  LIMA to LAD is patent. The stent in the mid LAD beyond the graft insertion is patent with minimal restenosis.   Left Ventricular Angiogram: LVEF=35-40%. Global hypokinesis with more severe hypokinesis in the inferior wall.   Impression: 1. Severe triple vessel CAD s/p 3V CABG with 1/3 patent bypass grafts 2. Patent stents in the left main, ostial and proximal Circumflex and mid LAD beyond graft insertion.  3. Moderate to severe LV systolic dysfunction-chronic  Recommendations: Will continue medical management of CAD. Smoking cessation is once again recommended.        Complications:  None. The patient tolerated the procedure well.

## 2015-02-12 NOTE — Interval H&P Note (Signed)
History and Physical Interval Note:  02/12/2015 9:18 AM  Steve Durham  has presented today for cardiac cath with the diagnosis of CAD/class 3 unstable angina. The various methods of treatment have been discussed with the patient and family. After consideration of risks, benefits and other options for treatment, the patient has consented to  Procedure(s): LEFT HEART CATHETERIZATION WITH CORONARY/GRAFT ANGIOGRAM (N/A) as a surgical intervention .  The patient's history has been reviewed, patient examined, no change in status, stable for surgery.  I have reviewed the patient's chart and labs.  Questions were answered to the patient's satisfaction.    Cath Lab Visit (complete for each Cath Lab visit)  Clinical Evaluation Leading to the Procedure:   ACS: No.  Non-ACS:    Anginal Classification: CCS III  Anti-ischemic medical therapy: Maximal Therapy (2 or more classes of medications)  Non-Invasive Test Results: No non-invasive testing performed  Prior CABG: Previous CABG         MCALHANY,CHRISTOPHER

## 2015-03-01 ENCOUNTER — Other Ambulatory Visit: Payer: Self-pay | Admitting: Cardiovascular Disease

## 2015-03-09 NOTE — Progress Notes (Signed)
Cardiology Office Note   Date:  03/10/2015   ID:  Steve Durham, DOB Apr 23, 1946, MRN [id number]  PCP:  No PCP Per Patient  Cardiologist:  Dr. Lauree Chandler     Chief Complaint  Patient presents with  . Coronary Artery Disease     History of Present Illness: Steve Durham is a 69 y.o. male with a hx of CAD s/p prior CABG 1993 and inf MI in 02/2014 with LM stenting (both SVGs known to be occluded), ICM, systolic CHF, aortic stenosis, HTN, HL. Turned down for redo CABG in 2013.   He was admitted 07/2014 with a non-STEMI. Urgent cardiac catheterization given ongoing symptoms demonstrated high-grade lesion proximal OM1 and high-grade in-stent restenosis in the distal portion of the left main stent. He underwent DES placement to the proximal OM1 and balloon angioplasty to the left main. Course was complicated by acute on chronic systolic CHF. He required diuresis with IV Lasix. LVEDP at his cardiac catheterization was elevated at 45 mmHg. EF was 25% at cardiac catheterization. Follow-up echocardiogram demonstrated an EF of 45% and mild aortic stenosis.  He was last seen by Dr. Lauree Chandler and complained of recurrent chest pain suggestive of unstable angina.  He was set up for cardiac cath.  LHC demonstrated patent stents in the left main, ostial and proximal Circumflex and mid LAD beyond graft insertion. Medical Rx was recommended.  He returns for FU.    He is overall doing well. He continues to have occasional chest pain. This usually occurs at night with lying flat. He notes it with certain meals. He denies dysphagia or odynophagia. He has seen gastroenterology in the past. He does not feel that the Protonix is working very well. He denies significant exertional symptoms. He continues to note exertional shortness of breath. This is unchanged. He is NYHA 2b. He sleeps on 2 pillows chronically. He denies PND or edema. He denies syncope.  Studies/Reports Reviewed  Today:  LHC 02/12/15 LM: stent patent with mild diffuse 20% restenosis.  LAD:  Ostial 100%  LCx:  30% restenosis in the stented segment which extends from ostium into OM1. AVCFX occluded and fills L-L collats RCA:  Known to be occluded at the ostium. (not engaged). The distal vessel fills L-R collats  SVG to PDA is known to be occluded  SVG to OM is known to be occluded  LIMA to LAD is patent. The stent in the mid LAD beyond the graft insertion is patent with minimal restenosis.  LVEF=35-40%. Global hypokinesis with more severe hypokinesis in the inferior wall.   Echo 08/02/14 - Severe hypokinesis base/mid-inferolateralsegments and severe hypokinesis base inferior segment. EF is 45%. Mild LVH. - Mild AS.  The AS may be underestimated because of the LV dysfunction. Meangradient (S): 14 mm Hg. Peak gradient (S): 25 mm Hg. VTI ratio ofLVOT to aortic valve: 0.37. - Left atrium: The atrium was moderately dilated. - Right ventricle: The cavity size was normal. Systolic functionwas mildly reduced.  Carotid US (10/13): No sig ICA stenosis   Past Medical History  Diagnosis Date  . Hypertension   . Hyperlipoproteinemia   . Aortic stenosis     a. Mod AS/AI by cath 07/2012.;  b.  Echo (07/31/13) is: EF 55-60%, mild aortic stenosis (mean gradient 13);  c. Echo (5/15):  EF 40-45%, mild AS (mean 12 mmHg), mod AI  . Back pain   . Carotid artery occlusion     a. Dopplers 07/2012: no significant high grade  obstruction.  . Hyperlipidemia   . Abnormal CT scan, kidney     07/2012 - multiple small cysts  . Cholelithiasis     Seen on CT 07/2012  . Coronary artery disease     a. CABG 10/1991. b. cath 07/2012 with prog dsz, turned down for re-do CABG - for med rx for now.; c. NSTEMI/PCI: DES to mLAD ext into IMA graft;  d. Inf STEMI (5/15):  LM 60-70% then 99% before LAD, pLAD occl, pCFX stent 80+% ISR, oRCA 99% then occl (L-R collats to dRCA), L-LAD ok with patent stent, S-CFX occl (old), S-RCA occl  (old); PCI: LM ext into CFX with Xience Alpine DES  . Ischemic cardiomyopathy     a. 2D ECHO: 08/02/2014; EF 45%; severe hypokinesis base/mid-inferolat segments and severe hypokinesis base inferior segment. Mild LVH, mild AS (may be underestimated due to LV dysfunction).  Mean gradient (S): 14 mm Hg. Peak gradient (S): 25 mm Hg. VTI ratio of LVOT to aortic valve: 0.37. Mod LA dilation. Mild RV systolic dysfxn     Past Surgical History  Procedure Laterality Date  . Cardiac catheterization  04/03/99  . Carpal tunnel release  2007    Excision mass dorsal left wrist  . Hernia repair  1988  . Coronary artery bypass graft  1993  . Fasciotomy Right 07/30/2013    Procedure: OPEN FASCIOTOMY RIGHT RING FINGER & RIGHT SMALL FINGER MULTIPLE LEVELS;  Surgeon: Wynonia Sours, MD;  Location: [facility];  Service: Orthopedics;  Laterality: Right;  . Left heart catheterization with coronary/graft angiogram N/A 08/10/2012    Procedure: LEFT HEART CATHETERIZATION WITH Beatrix Fetters;  Surgeon: Hillary Bow, MD;  Location: [facility] C[facility] LAB;  Service: Cardiovascular;  Laterality: N/A;  . Percutaneous coronary stent intervention (pci-s)  07/31/2013    Procedure: PERCUTANEOUS CORONARY STENT INTERVENTION (PCI-S);  Surgeon: Burnell Blanks, MD;  Location: [facility] CATH LAB;  Service: Cardiovascular;;  LIMA to LAD at the anastomosis with aortic root shot   . Left heart catheterization with coronary angiogram N/A 02/28/2014    Procedure: LEFT HEART CATHETERIZATION WITH CORONARY ANGIOGRAM;  Surgeon: Troy Sine, MD;  Location: [facility] CATH LAB;  Service: Cardiovascular;  Laterality: N/A;  . Left heart catheterization with coronary/graft angiogram N/A 08/01/2014    Procedure: LEFT HEART CATHETERIZATION WITH Beatrix Fetters;  Surgeon: Leonie Man, MD;  Location: [facility] CATH LAB;  Service: Cardiovascular;  Laterality: N/A;  . Left heart catheterization with coronary/graft angiogram N/A 02/12/2015     Procedure: LEFT HEART CATHETERIZATION WITH Beatrix Fetters;  Surgeon: Burnell Blanks, MD;  Location: [facility] CATH LAB;  Service: Cardiovascular;  Laterality: N/A;     Current Outpatient Prescriptions  Medication Sig Dispense Refill  . amLODipine-benazepril (LOTREL) 5-20 MG per capsule TAKE 2 CAPSULES BY MOUTH EVERY DAY 60 capsule 6  . aspirin EC 81 MG tablet Take 81 mg by mouth at bedtime.    . clopidogrel (PLAVIX) 75 MG tablet TAKE 1 TABLET (75 MG TOTAL) BY MOUTH DAILY WITH BREAKFAST. 30 tablet 11  . ezetimibe-simvastatin (VYTORIN) 10-40 MG per tablet Take 1 tablet by mouth at bedtime.     . fexofenadine (ALLEGRA) 180 MG tablet Take 180 mg by mouth daily as needed for allergies.     . furosemide (LASIX) 40 MG tablet Take 1 tablet (40 mg total) by mouth daily. 30 tablet 11  . isosorbide mononitrate (IMDUR) 60 MG 24 hr tablet TAKE 1 AND 1/2 TABLET (90 MG TOTAL) BY MOUTH  DAILY. 45 tablet 5  . metoprolol (LOPRESSOR) 50 MG tablet Take 50 mg by mouth 2 (two) times daily.    . nitroGLYCERIN (NITROSTAT) 0.4 MG SL tablet TAKE 1 TABLET UNDER THE TONGUE AS NEEDEDFOR CHEST PAIN ( MAY REPEAT EVERY 5 MINUTES X 3) 25 tablet 6  . Omega-3 Fatty Acids (FISH OIL) 1000 MG CAPS Take 1,000 mg by mouth 2 (two) times daily.      . potassium chloride SA (K-DUR,KLOR-CON) 20 MEQ tablet Take 1 tablet (20 mEq total) by mouth daily. 30 tablet 5  . spironolactone (ALDACTONE) 25 MG tablet Take 0.5 tablets (12.5 mg total) by mouth 2 (two) times daily. 30 tablet 5  . pantoprazole (PROTONIX) 40 MG tablet Take 1 tablet (40 mg total) by mouth daily. 30 tablet 6   No current facility-administered medications for this visit.    Allergies:   Review of patient's allergies indicates no known allergies.    Social History:  The patient  reports that he has been smoking.  He does not have any smokeless tobacco history on file. He reports that he drinks about 8.4 oz of alcohol per week. He reports that he does not use  illicit drugs.   Family History:  The patient's family history includes Emphysema in his brother; Heart attack in his mother; Hypertension in his brother, father, mother, and sister. There is no history of Stroke.    ROS:   Please see the history of present illness.   Review of Systems  Constitution: Negative for night sweats and weight loss.  Respiratory: Negative for cough.   Gastrointestinal: Negative for dysphagia, hematemesis, hematochezia and melena.  All other systems reviewed and are negative.     PHYSICAL EXAM: VS:  BP 130/50 mmHg  Pulse 72  Ht 5\' 5"  (1.651 m)  Wt 155 lb (70.308 kg)  BMI 25.79 kg/m2    Wt Readings from Last 3 Encounters:  03/10/15 155 lb (70.308 kg)  01/23/15 155 lb (70.308 kg)  09/20/14 151 lb (68.493 kg)     GEN: Well nourished, well developed, in no acute distress HEENT: normal Neck: no JVD, no carotid bruits, no masses Cardiac:  Normal N4/O2, RRR; 2/6 systolic murmur RUSB,  no rubs or gallops, no edema ; left wrist without hematoma or mass Respiratory: decreased breath sounds bilaterally, no wheezing, rhonchi or rales. GI: soft, nontender, nondistended, + BS MS: no deformity or atrophy Skin: warm and dry  Neuro:  CNs II-XII intact, Strength and sensation are intact Psych: Normal affect   EKG:  EKG is not ordered today.  It demonstrates:   n/a   Recent Labs: 08/02/2014: ALT 20 08/13/2014: Pro B Natriuretic peptide (BNP) 410.0* 02/10/2015: BUN 24*; Creatinine 1.30; Hemoglobin 13.6; Platelets 225.0; Potassium 5.0; Sodium 136    Lipid Panel    Component Value Date/Time   CHOL 143 03/01/2014 0129   TRIG 61 03/01/2014 0129   HDL 64 03/01/2014 0129   CHOLHDL 2.2 03/01/2014 0129   VLDL 12 03/01/2014 0129   LDLCALC 67 03/01/2014 0129      ASSESSMENT AND PLAN:  Coronary artery disease involving native coronary artery of native heart without angina pectoris:  He continues to have chest symptoms mainly with lying flat. He feels that is  related to acid reflux. Recent cardiac catheterization demonstrated stable anatomy and patent stents. He does not really have much in the way of exertional chest discomfort. He has not had significant improvement with Protonix. I have asked him to remain on  Protonix in the morning. We discussed the importance of limiting certain foods that contribute to increased acid production. I also recommended tobacco cessation. I have asked him to start taking Pepcid 20 mg every afternoon. If he does not have significant improvement with this, he should return for follow-up with gastroenterology. He has seen Dr. Lyndel Safe in [place] in the past. Continue current medical regimen for coronary artery disease which includes amlodipine, ACE inhibitor, aspirin, Plavix, Zetia, nitrates, beta blocker.  Ischemic cardiomyopathy:  Recent echo with EF 45%. Continue ACE inhibitor, beta blocker, nitrates, spironolactone.  Chronic systolic CHF (congestive heart failure):  Volume stable. He is NYHA 2b.  Essential hypertension:  Controlled.  Hyperlipidemia:  Arrange follow-up lipids and LFTs. Continue Vytorin.  Tobacco abuse:  He has been advised to quit.  Aortic stenosis :  Mild by echocardiogram in October. Plan repeat in October 2017.  GERD:  Continue Protonix. Add Pepcid as noted above. Consider follow-up with GI if symptoms continue.   Current medicines are reviewed at length with the patient today.  Concerns regarding medicines are as outlined above.  The following changes have been made:    Add Pepcid 20 mg every evening.   Labs/ tests ordered today include:  No orders of the defined types were placed in this encounter.    Disposition:   FU with Dr. Lauree Chandler 6 mos.   Signed, Versie Starks, MHS 03/10/2015 9:57 PM    [facility] [address], [place], [place]  [address] Phone: [phone]; Fax: [phone]

## 2015-03-10 ENCOUNTER — Ambulatory Visit (INDEPENDENT_AMBULATORY_CARE_PROVIDER_SITE_OTHER): Payer: Medicare Other | Admitting: Physician Assistant

## 2015-03-10 ENCOUNTER — Encounter: Payer: Self-pay | Admitting: Physician Assistant

## 2015-03-10 VITALS — BP 130/50 | HR 72 | Ht 65.0 in | Wt 155.0 lb

## 2015-03-10 DIAGNOSIS — I255 Ischemic cardiomyopathy: Secondary | ICD-10-CM

## 2015-03-10 DIAGNOSIS — I5022 Chronic systolic (congestive) heart failure: Secondary | ICD-10-CM

## 2015-03-10 DIAGNOSIS — I2510 Atherosclerotic heart disease of native coronary artery without angina pectoris: Secondary | ICD-10-CM | POA: Diagnosis not present

## 2015-03-10 DIAGNOSIS — E785 Hyperlipidemia, unspecified: Secondary | ICD-10-CM

## 2015-03-10 DIAGNOSIS — Z720 Tobacco use: Secondary | ICD-10-CM

## 2015-03-10 DIAGNOSIS — I350 Nonrheumatic aortic (valve) stenosis: Secondary | ICD-10-CM

## 2015-03-10 DIAGNOSIS — I10 Essential (primary) hypertension: Secondary | ICD-10-CM

## 2015-03-10 MED ORDER — PANTOPRAZOLE SODIUM 40 MG PO TBEC: 40.0000 mg | DELAYED_RELEASE_TABLET | Freq: Every day | ORAL | Status: DC

## 2015-03-10 NOTE — Patient Instructions (Signed)
Medication Instructions:  1. CONTINUE PROTONIX 40 MG DAILY  2. START OTC PEPCID 20 MG IN THE EVENING  Labwork: YOU HAVE BEEN GIVEN AN RX FOR FASTING LIPID AND LIVER PANEL TO BE DONE IN IN THE NEXT FEW WEEKS WITH THE RESULTS TO BE FAXED TO [place], [place]  Testing/Procedures: NONE  Follow-Up: Your physician wants you to follow-up in: [place] DR. Angelena Form. You will receive a reminder letter in the mail two months in advance. If you don't receive a letter, please call our office to schedule the follow-up appointment.   Any Other Special Instructions Will Be Listed Below (If Applicable).

## 2015-03-17 ENCOUNTER — Other Ambulatory Visit: Payer: Self-pay | Admitting: Cardiology

## 2015-03-18 ENCOUNTER — Inpatient Hospital Stay (HOSPITAL_COMMUNITY)
Admission: AD | Admit: 2015-03-18 | Discharge: 2015-03-23 | DRG: 280 | Disposition: A | Discharge status: Home / Self Care | Payer: Medicare Other | Source: Other Acute Inpatient Hospital | Attending: Cardiovascular Disease | Admitting: Cardiovascular Disease

## 2015-03-18 DIAGNOSIS — I2582 Chronic total occlusion of coronary artery: Secondary | ICD-10-CM | POA: Diagnosis present

## 2015-03-18 DIAGNOSIS — Z955 Presence of coronary angioplasty implant and graft: Secondary | ICD-10-CM | POA: Diagnosis not present

## 2015-03-18 DIAGNOSIS — R9431 Abnormal electrocardiogram [ECG] [EKG]: Secondary | ICD-10-CM | POA: Diagnosis not present

## 2015-03-18 DIAGNOSIS — Z79899 Other long term (current) drug therapy: Secondary | ICD-10-CM | POA: Diagnosis not present

## 2015-03-18 DIAGNOSIS — Z7902 Long term (current) use of antithrombotics/antiplatelets: Secondary | ICD-10-CM

## 2015-03-18 DIAGNOSIS — I350 Nonrheumatic aortic (valve) stenosis: Secondary | ICD-10-CM | POA: Diagnosis not present

## 2015-03-18 DIAGNOSIS — I5023 Acute on chronic systolic (congestive) heart failure: Secondary | ICD-10-CM | POA: Insufficient documentation

## 2015-03-18 DIAGNOSIS — I255 Ischemic cardiomyopathy: Secondary | ICD-10-CM | POA: Diagnosis present

## 2015-03-18 DIAGNOSIS — I129 Hypertensive chronic kidney disease with stage 1 through stage 4 chronic kidney disease, or unspecified chronic kidney disease: Secondary | ICD-10-CM | POA: Diagnosis present

## 2015-03-18 DIAGNOSIS — R0600 Dyspnea, unspecified: Secondary | ICD-10-CM | POA: Diagnosis not present

## 2015-03-18 DIAGNOSIS — E785 Hyperlipidemia, unspecified: Secondary | ICD-10-CM | POA: Diagnosis present

## 2015-03-18 DIAGNOSIS — I252 Old myocardial infarction: Secondary | ICD-10-CM

## 2015-03-18 DIAGNOSIS — I25810 Atherosclerosis of coronary artery bypass graft(s) without angina pectoris: Secondary | ICD-10-CM

## 2015-03-18 DIAGNOSIS — I25700 Atherosclerosis of coronary artery bypass graft(s), unspecified, with unstable angina pectoris: Secondary | ICD-10-CM | POA: Diagnosis not present

## 2015-03-18 DIAGNOSIS — N183 Chronic kidney disease, stage 3 (moderate): Secondary | ICD-10-CM | POA: Diagnosis present

## 2015-03-18 DIAGNOSIS — F17210 Nicotine dependence, cigarettes, uncomplicated: Secondary | ICD-10-CM | POA: Diagnosis present

## 2015-03-18 DIAGNOSIS — I5022 Chronic systolic (congestive) heart failure: Secondary | ICD-10-CM | POA: Diagnosis present

## 2015-03-18 DIAGNOSIS — J441 Chronic obstructive pulmonary disease with (acute) exacerbation: Secondary | ICD-10-CM | POA: Diagnosis not present

## 2015-03-18 DIAGNOSIS — R05 Cough: Secondary | ICD-10-CM | POA: Diagnosis present

## 2015-03-18 DIAGNOSIS — R079 Chest pain, unspecified: Secondary | ICD-10-CM

## 2015-03-18 DIAGNOSIS — I10 Essential (primary) hypertension: Secondary | ICD-10-CM | POA: Diagnosis present

## 2015-03-18 DIAGNOSIS — J40 Bronchitis, not specified as acute or chronic: Secondary | ICD-10-CM | POA: Diagnosis present

## 2015-03-18 DIAGNOSIS — N289 Disorder of kidney and ureter, unspecified: Secondary | ICD-10-CM | POA: Diagnosis present

## 2015-03-18 DIAGNOSIS — R058 Other specified cough: Secondary | ICD-10-CM | POA: Diagnosis present

## 2015-03-18 DIAGNOSIS — K219 Gastro-esophageal reflux disease without esophagitis: Secondary | ICD-10-CM | POA: Diagnosis present

## 2015-03-18 DIAGNOSIS — I214 Non-ST elevation (NSTEMI) myocardial infarction: Secondary | ICD-10-CM | POA: Diagnosis present

## 2015-03-18 DIAGNOSIS — Z8249 Family history of ischemic heart disease and other diseases of the circulatory system: Secondary | ICD-10-CM | POA: Diagnosis not present

## 2015-03-18 DIAGNOSIS — I2510 Atherosclerotic heart disease of native coronary artery without angina pectoris: Secondary | ICD-10-CM | POA: Diagnosis not present

## 2015-03-18 DIAGNOSIS — I5043 Acute on chronic combined systolic (congestive) and diastolic (congestive) heart failure: Secondary | ICD-10-CM | POA: Diagnosis present

## 2015-03-18 DIAGNOSIS — R0602 Shortness of breath: Secondary | ICD-10-CM | POA: Diagnosis not present

## 2015-03-18 DIAGNOSIS — J449 Chronic obstructive pulmonary disease, unspecified: Secondary | ICD-10-CM | POA: Diagnosis present

## 2015-03-18 DIAGNOSIS — I509 Heart failure, unspecified: Secondary | ICD-10-CM | POA: Diagnosis not present

## 2015-03-18 HISTORY — DX: Heart failure, unspecified: I50.9

## 2015-03-18 NOTE — H&P (Addendum)
Patient ID: Steve Durham MRN: [id number], DOB/AGE: 11-18-1945   Admit date: (Not on file)   Primary Physician: No PCP Per Patient Primary Cardiologist: Dr. Lauree Chandler  Pt. Profile:  Steve Durham is a 69 y.o. male with a hx of CAD s/p prior CABG 1993 and inf MI in 02/2014 with LM stenting (turned down for re do CABG in 2013, both SVGs known to be occluded) with LM POBA and proximal OM1 PCI in 53/6644, ICM, systolic CHF (EF 03% 47/4259), mild-moderate aortic stenosis, HTN, HL who presents with SOB and ECG changes.   Problem List  Past Medical History  Diagnosis Date  . Hypertension   . Hyperlipoproteinemia   . Aortic stenosis     a. Mod AS/AI by cath 07/2012.;  b.  Echo (07/31/13) is: EF 55-60%, mild aortic stenosis (mean gradient 13);  c. Echo (5/15):  EF 40-45%, mild AS (mean 12 mmHg), mod AI  . Back pain   . Carotid artery occlusion     a. Dopplers 07/2012: no significant high grade obstruction.  . Hyperlipidemia   . Abnormal CT scan, kidney     07/2012 - multiple small cysts  . Cholelithiasis     Seen on CT 07/2012  . Coronary artery disease     a. CABG 10/1991. b. cath 07/2012 with prog dsz, turned down for re-do CABG - for med rx for now.; c. NSTEMI/PCI: DES to mLAD ext into IMA graft;  d. Inf STEMI (5/15):  LM 60-70% then 99% before LAD, pLAD occl, pCFX stent 80+% ISR, oRCA 99% then occl (L-R collats to dRCA), L-LAD ok with patent stent, S-CFX occl (old), S-RCA occl (old); PCI: LM ext into CFX with Xience Alpine DES  . Ischemic cardiomyopathy     a. 2D ECHO: 08/02/2014; EF 45%; severe hypokinesis base/mid-inferolat segments and severe hypokinesis base inferior segment. Mild LVH, mild AS (may be underestimated due to LV dysfunction).  Mean gradient (S): 14 mm Hg. Peak gradient (S): 25 mm Hg. VTI ratio of LVOT to aortic valve: 0.37. Mod LA dilation. Mild RV systolic dysfxn     Past Surgical History  Procedure Laterality Date  . Cardiac catheterization   04/03/99  . Carpal tunnel release  2007    Excision mass dorsal left wrist  . Hernia repair  1988  . Coronary artery bypass graft  1993  . Fasciotomy Right 07/30/2013    Procedure: OPEN FASCIOTOMY RIGHT RING FINGER & RIGHT SMALL FINGER MULTIPLE LEVELS;  Surgeon: Wynonia Sours, MD;  Location: [facility];  Service: Orthopedics;  Laterality: Right;  . Left heart catheterization with coronary/graft angiogram N/A 08/10/2012    Procedure: LEFT HEART CATHETERIZATION WITH Beatrix Fetters;  Surgeon: Hillary Bow, MD;  Location: [facility] CATH LAB;  Service: Cardiovascular;  Laterality: N/A;  . Percutaneous coronary stent intervention (pci-s)  07/31/2013    Procedure: PERCUTANEOUS CORONARY STENT INTERVENTION (PCI-S);  Surgeon: Burnell Blanks, MD;  Location: [facility] CATH LAB;  Service: Cardiovascular;;  LIMA to LAD at the anastomosis with aortic root shot   . Left heart catheterization with coronary angiogram N/A 02/28/2014    Procedure: LEFT HEART CATHETERIZATION WITH CORONARY ANGIOGRAM;  Surgeon: Troy Sine, MD;  Location: [facility] CATH LAB;  Service: Cardiovascular;  Laterality: N/A;  . Left heart catheterization with coronary/graft angiogram N/A 08/01/2014    Procedure: LEFT HEART CATHETERIZATION WITH Beatrix Fetters;  Surgeon: Leonie Man, MD;  Location: [facility] CATH LAB;  Service: Cardiovascular;  Laterality: N/A;  . Left heart catheterization with coronary/graft angiogram N/A 02/12/2015    Procedure: LEFT HEART CATHETERIZATION WITH Beatrix Fetters;  Surgeon: Burnell Blanks, MD;  Location: [facility] CATH LAB;  Service: Cardiovascular;  Laterality: N/A;     Allergies  No Known Allergies  HPI  [name] is a 69 y.o. male with a hx of CAD s/p prior CABG 1993 and inf MI in 02/2014 with LM stenting (turned down for re do CABG in 2013, both SVGs known to be occluded) with LM POBA and proximal OM1 PCI in 65/6812, ICM, systolic CHF (EF 75% 17/0017), mild-moderate  aortic stenosis, HTN, HL who presents with SOB and ECG changes.   [name] reports that he has had worsening of his baseline dyspnea since this past Sunday. He notes associated cough with dark white/yellow sputum. Denies fevers, chills, or sweats. Wheezing was at baseline. No sick contacts. Dyspnea worse with exertion and lying flat, both of which are longstanding. He has chronic PND and 2+ pillow orthopnea. Although he endorses intermittent light chest pressure, be believes it is related to a respiratory infection and not ischemic pain. He states his prior MI pain was "pain across the shoulders and to the back" and that the current intermittent pressure is very difference.   Due to progressive dyspnea, [name] was evaluated at his PCP office and sent to the [facility] ER.  On arrival to the ER, he was chest pain free and hemodynamically stable. Labs were notable for Cr 1.7, TnI 0.01 -->0.03, WBC 20.3. ECG demonstrated lateral STD which were new compared to priors. He was given 324mg  PO aspirin, duonebs, 125mg  solumedrol, and 0.5 inches of nitropaste. CXR demonstrated "no acute abnormalities" per report. Dr. Angelena Form reviewed the ECG and requested transfer to [facility].   Of note, Mr. Ayala last LHC was on 02/12/15 and it demonstrated: LM: stent patent with mild diffuse 20% restenosis.  LAD: Ostial 100%  LCx: 30% restenosis in the stented segment which extends from ostium into OM1. AVCFX occluded and fills L-L collats RCA: Known to be occluded at the ostium. (not engaged). The distal vessel fills L-R collats  SVG to PDA is known to be occluded  SVG to OM is known to be occluded  LIMA to LAD is patent. The stent in the mid LAD beyond the graft insertion is patent with minimal restenosis.  LVEF=35-40%. Global hypokinesis with more severe hypokinesis in the inferior wall.   Home Medications  Prior to Admission medications   Medication Sig Start Date End Date Taking? Authorizing Provider    amLODipine-benazepril (LOTREL) 5-20 MG per capsule TAKE 2 CAPSULES BY MOUTH EVERY DAY 09/23/14   Burnell Blanks, MD  aspirin EC 81 MG tablet Take 81 mg by mouth at bedtime.    Historical Provider, MD  clopidogrel (PLAVIX) 75 MG tablet TAKE 1 TABLET (75 MG TOTAL) BY MOUTH DAILY WITH BREAKFAST. 08/28/14   Burnell Blanks, MD  ezetimibe-simvastatin (VYTORIN) 10-40 MG per tablet Take 1 tablet by mouth at bedtime.     Historical Provider, MD  fexofenadine (ALLEGRA) 180 MG tablet Take 180 mg by mouth daily as needed for allergies.     Historical Provider, MD  furosemide (LASIX) 40 MG tablet Take 1 tablet (40 mg total) by mouth daily. 01/23/15   Burnell Blanks, MD  isosorbide mononitrate (IMDUR) 60 MG 24 hr tablet TAKE 1 AND 1/2 TABLET (90 MG TOTAL) BY MOUTH DAILY. 02/11/15   Burnell Blanks, MD  metoprolol (  LOPRESSOR) 50 MG tablet Take 50 mg by mouth 2 (two) times daily.    Historical Provider, MD  nitroGLYCERIN (NITROSTAT) 0.4 MG SL tablet TAKE 1 TABLET UNDER THE TONGUE AS NEEDEDFOR CHEST PAIN ( MAY REPEAT EVERY 5 MINUTES X 3) 01/23/15   Burnell Blanks, MD  Omega-3 Fatty Acids (FISH OIL) 1000 MG CAPS Take 1,000 mg by mouth 2 (two) times daily.      Historical Provider, MD  pantoprazole (PROTONIX) 40 MG tablet Take 1 tablet (40 mg total) by mouth daily. 03/10/15   Liliane Shi, PA-C  potassium chloride SA (K-DUR,KLOR-CON) 20 MEQ tablet Take 1 tablet (20 mEq total) by mouth daily. 08/07/14   Brittainy Erie Noe, PA-C  spironolactone (ALDACTONE) 25 MG tablet Take 0.5 tablets (12.5 mg total) by mouth 2 (two) times daily. 08/07/14   Brittainy Erie Noe, PA-C    Family History  Family History  Problem Relation Age of Onset  . Heart attack Mother     died @ 27.  Marland Kitchen Hypertension Sister   . Emphysema Brother   . Stroke Neg Hx   . Hypertension Mother   . Hypertension Father   . Hypertension Brother     Social History  History   Social History  . Marital Status:  Married    Spouse Name: N/A  . Number of Children: 1  . Years of Education: N/A   Occupational History  . Plumbing     Retired in 2007   Social History Main Topics  . Smoking status: Current Some Day Smoker -- 1.00 packs/day for 50 years    Last Attempt to Quit: 08/01/2014  . Smokeless tobacco: Not on file     Comment: has cut back   . Alcohol Use: 8.4 oz/week    14 Cans of beer per week     Comment: daily-6 daily  . Drug Use: No  . Sexual Activity: Not on file   Other Topics Concern  . Not on file   Social History Narrative   Lives in [place] with his family.  Does not routinely exercise.     Review of Systems General:  No chills, fever, night sweats or weight changes.  Cardiovascular: See HPI Dermatological: No rash, lesions/masses Respiratory:  See HPI Urologic: No hematuria, dysuria Abdominal:   No nausea, vomiting, diarrhea, bright red blood per rectum, melena, or hematemesis Neurologic:  No visual changes, wkns, changes in mental status. All other systems reviewed and are otherwise negative except as noted above.  Physical Exam  Blood pressure 146/66, pulse 95, temperature 98 F (36.7 C), temperature source Oral, resp. rate 20, height 5\' 5"  (1.651 m), weight 65.409 kg (144 lb 3.2 oz), SpO2 95 %.  General: Pleasant, NAD Psych: Normal affect. Neuro: Alert and oriented X 3. Moves all extremities spontaneously. HEENT: Normal  Neck: Supple without bruits or JVD. Lungs:  Resp regular and unlabored, CTA. Heart: RRR. Musical late peaking systolic crescendo decrescendo murmur with radiation to the carotids and across the precordium. No rubs or gallops Abdomen: Soft, non-tender, non-distended, BS + x 4.  Extremities: No clubbing, cyanosis or edema. DP/PT/Radials 2+ and equal bilaterally.  Labs  Troponin (Point of Care Test) No results for input(s): TROPIPOC in the last 72 hours. No results for input(s): CKTOTAL, CKMB, TROPONINI in the last 72 hours. Lab Results    Component Value Date   WBC 11.6* 02/10/2015   HGB 13.6 02/10/2015   HCT 40.5 02/10/2015   MCV 95.0 02/10/2015  PLT 225.0 02/10/2015   No results for input(s): NA, K, CL, CO2, BUN, CREATININE, CALCIUM, PROT, BILITOT, ALKPHOS, ALT, AST, GLUCOSE in the last 168 hours.  Invalid input(s): LABALBU Lab Results  Component Value Date   CHOL 143 03/01/2014   HDL 64 03/01/2014   LDLCALC 67 03/01/2014   TRIG 61 03/01/2014   No results found for: DDIMER   Radiology/Studies  No results found.  LHC 02/12/15 LM: stent patent with mild diffuse 20% restenosis.  LAD: Ostial 100%  LCx: 30% restenosis in the stented segment which extends from ostium into OM1. AVCFX occluded and fills L-L collats RCA: Known to be occluded at the ostium. (not engaged). The distal vessel fills L-R collats  SVG to PDA is known to be occluded  SVG to OM is known to be occluded  LIMA to LAD is patent. The stent in the mid LAD beyond the graft insertion is patent with minimal restenosis.  LVEF=35-40%. Global hypokinesis with more severe hypokinesis in the inferior wall.   Echo 08/02/14 - Severe hypokinesis base/mid-inferolateralsegments and severe hypokinesis base inferior segment. EF is 45%. Mild LVH. - Mild AS. The AS may be underestimated because of the LV dysfunction. Meangradient (S): 14 mm Hg. Peak gradient (S): 25 mm Hg. VTI ratio ofLVOT to aortic valve: 0.37. - Left atrium: The atrium was moderately dilated. - Right ventricle: The cavity size was normal. Systolic functionwas mildly reduced.  ECG  01/23/15: SB, 1st degree HB, possible IMI, NSSTTWC 03/18/15 @ 17:16 NSR. Inferior Q waves. 60mm STD in V4-V6/ LVH.  PVC. 03/18/15 @ 18:23. 15mm STD in V5. Sub mm STD in V4 and V6. LAE. LVH  ASSESSMENT AND PLAN  Laiken Nohr Venn is a 69 y.o. male with a hx of CAD s/p prior CABG 1993 and inf MI in 02/2014 with LM stenting (turned down for re do CABG in 2013, both SVGs known to be occluded) with LM POBA and  proximal OM1 PCI in 76/5465, ICM, systolic CHF (EF 03% 54/6568), mild-moderate aortic stenosis, HTN, HL who presents with SOB and ECG changes. The current presentation is most consistent with a respiratory infection (no clear wheezing/COPD) with secondary ischemic changes rather than a spontaneous plaque rupture. Functionally, he appears to be failing a stress test. He will need treatment for a mild respiratory infection and consideration for invasive risk stratification.   ECG changes. These findings are particularly concerning given known left main PCI. Although his last cath was 01/2015, he underwent POBA of the LM which may not have a durable result.  1. ASA, plavix, metoprolol, vytorin, imdur, fish oils 2. Cycle trops 3. NPO for possible cardiac cath  Respiratory infection. CXR and exam are without objective findings. No evidence for concomitant COPD exacerbation. Given age and comorbities, will err on the side of treating for a bacterial etiology.  1. Levoquin per pharmacy for respiratory infection 2. PRN duonebs  ICM. Appears euvolemic to dry on exam 1. Continue metoprolol, lisinopril, spiro 2. Hold lasix d/t elevated Cr (b/l Cr 1.3)  Signed, Lamar Sprinkles, MD 03/18/2015, 9:21 PM  Addendum  535am pt developed acute onset of 10/10 CP and shaking shortly after starting LVQ infusion. He states he has had these spells before and does not think its related to LVQ. Denies SOB, itchiness, tongue swelling. He became tachy to 120s and hypertensive to 170s. ECG demonstrated worsened lateral ST depressions. With 4mg  IV morphine, 5mg  IV lopressor, and 1mg  IV ativan, symptoms improved to 2/10. Repeat ECG demonstrated stable findings.  Will start IV heparin and IV NTG. Will likely need cath today.

## 2015-03-19 ENCOUNTER — Other Ambulatory Visit: Payer: Self-pay

## 2015-03-19 DIAGNOSIS — I214 Non-ST elevation (NSTEMI) myocardial infarction: Principal | ICD-10-CM

## 2015-03-19 DIAGNOSIS — J449 Chronic obstructive pulmonary disease, unspecified: Secondary | ICD-10-CM | POA: Diagnosis present

## 2015-03-19 LAB — CREATININE, SERUM
CREATININE: 2.03 mg/dL — AB (ref 0.61–1.24)
GFR calc Af Amer: 37 mL/min — ABNORMAL LOW (ref >60)
GFR calc non Af Amer: 32 mL/min — ABNORMAL LOW (ref >60)

## 2015-03-19 LAB — CBC
HCT: 36.2 % — ABNORMAL LOW (ref 39.0–52.0)
Hemoglobin: 12 g/dL — ABNORMAL LOW (ref 13.0–17.0)
MCH: 32 pg (ref 26.0–34.0)
MCHC: 33.1 g/dL (ref 30.0–36.0)
MCV: 96.5 fL (ref 78.0–100.0)
Platelets: 246 10*3/uL (ref 150–400)
RBC: 3.75 MIL/uL — AB (ref 4.22–5.81)
RDW: 12.9 % (ref 11.5–15.5)
WBC: 15.2 10*3/uL — AB (ref 4.0–10.5)

## 2015-03-19 LAB — TROPONIN I
Troponin I: 0.17 ng/mL — ABNORMAL HIGH (ref <0.031)
Troponin I: 0.74 ng/mL (ref <0.031)
Troponin I: 1.38 ng/mL (ref <0.031)

## 2015-03-19 LAB — HEPARIN LEVEL (UNFRACTIONATED)

## 2015-03-19 LAB — PLATELET INHIBITION P2Y12: Platelet Function  P2Y12: 202 [PRU] (ref 194–418)

## 2015-03-19 MED ORDER — MORPHINE SULFATE 4 MG/ML IJ SOLN: 4.0000 mg | Freq: Once | INTRAMUSCULAR | Status: DC

## 2015-03-19 MED ORDER — NITROGLYCERIN IN D5W 200-5 MCG/ML-% IV SOLN
10.0000 ug/min | INTRAVENOUS | Status: DC
  Administered 2015-03-19: 25 ug/min via INTRAVENOUS

## 2015-03-19 MED ORDER — SPIRONOLACTONE 12.5 MG HALF TABLET
12.5000 mg | ORAL_TABLET | Freq: Two times a day (BID) | ORAL | Status: DC
  Administered 2015-03-19: 12.5 mg via ORAL
  Filled 2015-03-19 (×3): qty 1

## 2015-03-19 MED ORDER — METOPROLOL TARTRATE 1 MG/ML IV SOLN
5.0000 mg | Freq: Once | INTRAVENOUS | Status: AC
  Administered 2015-03-19: 5 mg via INTRAVENOUS

## 2015-03-19 MED ORDER — OMEGA-3-ACID ETHYL ESTERS 1 G PO CAPS
1.0000 g | ORAL_CAPSULE | Freq: Every day | ORAL | Status: DC
  Administered 2015-03-19 – 2015-03-22 (×4): 1 g via ORAL
  Filled 2015-03-19 (×4): qty 1

## 2015-03-19 MED ORDER — NITROGLYCERIN IN D5W 200-5 MCG/ML-% IV SOLN
INTRAVENOUS | Status: AC
  Filled 2015-03-19: qty 250

## 2015-03-19 MED ORDER — LORAZEPAM 2 MG/ML IJ SOLN
1.0000 mg | Freq: Once | INTRAMUSCULAR | Status: AC
Start: 2015-03-19 — End: 2015-03-19
  Administered 2015-03-19: 1 mg via INTRAVENOUS

## 2015-03-19 MED ORDER — AMLODIPINE BESYLATE 5 MG PO TABS
5.0000 mg | ORAL_TABLET | Freq: Every day | ORAL | Status: DC
  Administered 2015-03-21 – 2015-03-23 (×3): 5 mg via ORAL
  Filled 2015-03-19 (×5): qty 1

## 2015-03-19 MED ORDER — METOPROLOL TARTRATE 1 MG/ML IV SOLN
INTRAVENOUS | Status: AC
  Administered 2015-03-19: 5 mg
  Filled 2015-03-19: qty 5

## 2015-03-19 MED ORDER — LORAZEPAM 2 MG/ML IJ SOLN
INTRAMUSCULAR | Status: AC
  Administered 2015-03-19: 1 mg
  Filled 2015-03-19: qty 1

## 2015-03-19 MED ORDER — LEVOFLOXACIN IN D5W 750 MG/150ML IV SOLN: 750.0000 mg | INTRAVENOUS | Status: DC

## 2015-03-19 MED ORDER — EZETIMIBE-SIMVASTATIN 10-40 MG PO TABS: 1.0000 | ORAL_TABLET | Freq: Every day | ORAL | Status: DC

## 2015-03-19 MED ORDER — HEPARIN (PORCINE) IN NACL 100-0.45 UNIT/ML-% IJ SOLN
1300.0000 [IU]/h | INTRAMUSCULAR | Status: DC
  Administered 2015-03-19: 800 [IU]/h via INTRAVENOUS
  Filled 2015-03-19 (×3): qty 250

## 2015-03-19 MED ORDER — ONDANSETRON HCL 4 MG/2ML IJ SOLN: 4.0000 mg | Freq: Four times a day (QID) | INTRAMUSCULAR | Status: DC | PRN

## 2015-03-19 MED ORDER — LISINOPRIL 20 MG PO TABS
20.0000 mg | ORAL_TABLET | Freq: Every day | ORAL | Status: DC
  Filled 2015-03-19: qty 1

## 2015-03-19 MED ORDER — HEPARIN SODIUM (PORCINE) 5000 UNIT/ML IJ SOLN
5000.0000 [IU] | Freq: Three times a day (TID) | INTRAMUSCULAR | Status: DC
  Filled 2015-03-19 (×3): qty 1

## 2015-03-19 MED ORDER — SODIUM CHLORIDE 0.9 % IV SOLN
INTRAVENOUS | Status: DC
  Administered 2015-03-19 – 2015-03-20 (×2): via INTRAVENOUS

## 2015-03-19 MED ORDER — NITROGLYCERIN 0.4 MG SL SUBL
SUBLINGUAL_TABLET | SUBLINGUAL | Status: AC
  Filled 2015-03-19: qty 1

## 2015-03-19 MED ORDER — ATORVASTATIN CALCIUM 20 MG PO TABS
20.0000 mg | ORAL_TABLET | Freq: Every day | ORAL | Status: DC
  Administered 2015-03-20 – 2015-03-22 (×3): 20 mg via ORAL
  Filled 2015-03-19 (×5): qty 1

## 2015-03-19 MED ORDER — POTASSIUM CHLORIDE CRYS ER 20 MEQ PO TBCR
20.0000 meq | EXTENDED_RELEASE_TABLET | Freq: Every day | ORAL | Status: DC
  Administered 2015-03-19: 20 meq via ORAL
  Filled 2015-03-19: qty 1

## 2015-03-19 MED ORDER — LEVOFLOXACIN IN D5W 750 MG/150ML IV SOLN
750.0000 mg | Freq: Once | INTRAVENOUS | Status: AC
  Administered 2015-03-19: 750 mg via INTRAVENOUS
  Filled 2015-03-19 (×2): qty 150

## 2015-03-19 MED ORDER — ASPIRIN EC 81 MG PO TBEC
81.0000 mg | DELAYED_RELEASE_TABLET | Freq: Every day | ORAL | Status: DC
  Administered 2015-03-19: 81 mg via ORAL
  Filled 2015-03-19 (×2): qty 1

## 2015-03-19 MED ORDER — PANTOPRAZOLE SODIUM 40 MG PO TBEC
40.0000 mg | DELAYED_RELEASE_TABLET | Freq: Every day | ORAL | Status: DC
  Administered 2015-03-19 – 2015-03-21 (×3): 40 mg via ORAL
  Filled 2015-03-19 (×4): qty 1

## 2015-03-19 MED ORDER — LORATADINE 10 MG PO TABS
10.0000 mg | ORAL_TABLET | Freq: Every day | ORAL | Status: DC
  Administered 2015-03-19 – 2015-03-23 (×5): 10 mg via ORAL
  Filled 2015-03-19 (×5): qty 1

## 2015-03-19 MED ORDER — IPRATROPIUM-ALBUTEROL 0.5-2.5 (3) MG/3ML IN SOLN
3.0000 mL | RESPIRATORY_TRACT | Status: DC
  Administered 2015-03-19 – 2015-03-23 (×23): 3 mL via RESPIRATORY_TRACT
  Filled 2015-03-19 (×27): qty 3

## 2015-03-19 MED ORDER — METOPROLOL TARTRATE 50 MG PO TABS
50.0000 mg | ORAL_TABLET | Freq: Two times a day (BID) | ORAL | Status: DC
  Administered 2015-03-19 – 2015-03-22 (×6): 50 mg via ORAL
  Filled 2015-03-19 (×8): qty 1

## 2015-03-19 MED ORDER — HEPARIN BOLUS VIA INFUSION
4000.0000 [IU] | Freq: Once | INTRAVENOUS | Status: AC
  Administered 2015-03-19: 4000 [IU] via INTRAVENOUS
  Filled 2015-03-19: qty 4000

## 2015-03-19 MED ORDER — ACETAMINOPHEN 325 MG PO TABS: 650.0000 mg | ORAL_TABLET | ORAL | Status: DC | PRN

## 2015-03-19 MED ORDER — CLOPIDOGREL BISULFATE 75 MG PO TABS
75.0000 mg | ORAL_TABLET | Freq: Every day | ORAL | Status: DC
  Administered 2015-03-19 – 2015-03-23 (×5): 75 mg via ORAL
  Filled 2015-03-19 (×6): qty 1

## 2015-03-19 MED ORDER — EZETIMIBE 10 MG PO TABS
10.0000 mg | ORAL_TABLET | Freq: Every day | ORAL | Status: DC
  Administered 2015-03-20 – 2015-03-22 (×3): 10 mg via ORAL
  Filled 2015-03-19 (×4): qty 1

## 2015-03-19 MED ORDER — MORPHINE SULFATE 4 MG/ML IJ SOLN
INTRAMUSCULAR | Status: AC
  Administered 2015-03-19: 06:00:00
  Filled 2015-03-19: qty 1

## 2015-03-19 MED ORDER — ISOSORBIDE MONONITRATE ER 60 MG PO TB24
60.0000 mg | ORAL_TABLET | Freq: Every day | ORAL | Status: DC
  Filled 2015-03-19: qty 1

## 2015-03-19 NOTE — Progress Notes (Signed)
Pt complained of sharp chest pains left, sharp in character non radiating. NTG SL given.after 5 min pt chest pain down to 7/10.2nd tab of NTG given. Dr. Tommi Rumps made aware of chest pains as well as VS.after 5 min 3rd tab of NTG sl given .EKG done. Dr Tommi Rumps in room saw pt.with verbal orders to give MSo4, lopressor IV and ativan IV . Which were given.

## 2015-03-19 NOTE — Progress Notes (Signed)
Pt refused to wear SCD's

## 2015-03-19 NOTE — Progress Notes (Signed)
ANTICOAGULATION CONSULT NOTE - Follow Up Consult  Pharmacy Consult for Heparin Indication: chest pain/ACS  No Known Allergies  Patient Measurements: Height: 5\' 5"  (165.1 cm) Weight: 144 lb 11.2 oz (65.635 kg) IBW/kg (Calculated) : 61.5 Heparin Dosing Weight: 65 kg  Vital Signs: Temp: 98.4 F (36.9 C) (06/01 1359) Temp Source: Oral (06/01 1359) BP: 89/50 mmHg (06/01 1401) Pulse Rate: 70 (06/01 1359)  Labs:  Recent Labs  03/19/15 0304 03/19/15 0911 03/19/15 1030 03/19/15 1518  HGB 12.0*  --   --   --   HCT 36.2*  --   --   --   PLT 246  --   --   --   HEPARINUNFRC  --   --   --  <0.10*  CREATININE 2.03*  --   --   --   TROPONINI 0.17* 0.74* 1.38*  --     Estimated Creatinine Clearance: 29.5 mL/min (by C-G formula based on Cr of 2.03).   Medical History: Past Medical History  Diagnosis Date  . Hypertension   . Hyperlipoproteinemia   . Aortic stenosis     a. Mod AS/AI by cath 07/2012.;  b.  Echo (07/31/13) is: EF 55-60%, mild aortic stenosis (mean gradient 13);  c. Echo (5/15):  EF 40-45%, mild AS (mean 12 mmHg), mod AI  . Back pain   . Carotid artery occlusion     a. Dopplers 07/2012: no significant high grade obstruction.  . Hyperlipidemia   . Abnormal CT scan, kidney     07/2012 - multiple small cysts  . Cholelithiasis     Seen on CT 07/2012  . Coronary artery disease     a. CABG 10/1991. b. cath 07/2012 with prog dsz, turned down for re-do CABG - for med rx for now.; c. NSTEMI/PCI: DES to mLAD ext into IMA graft;  d. Inf STEMI (5/15):  LM 60-70% then 99% before LAD, pLAD occl, pCFX stent 80+% ISR, oRCA 99% then occl (L-R collats to dRCA), L-LAD ok with patent stent, S-CFX occl (old), S-RCA occl (old); PCI: LM ext into CFX with Xience Alpine DES  . Ischemic cardiomyopathy     a. 2D ECHO: 08/02/2014; EF 45%; severe hypokinesis base/mid-inferolat segments and severe hypokinesis base inferior segment. Mild LVH, mild AS (may be underestimated due to LV dysfunction).   Mean gradient (S): 14 mm Hg. Peak gradient (S): 25 mm Hg. VTI ratio of LVOT to aortic valve: 0.37. Mod LA dilation. Mild RV systolic dysfxn     Medications:  Prescriptions prior to admission  Medication Sig Dispense Refill Last Dose  . clopidogrel (PLAVIX) 75 MG tablet TAKE 1 TABLET (75 MG TOTAL) BY MOUTH DAILY WITH BREAKFAST. 30 tablet 11 03/18/2015 at Unknown time  . famotidine (PEPCID) 10 MG tablet Take 10 mg by mouth at bedtime.   03/18/2015 at Unknown time  . pantoprazole (PROTONIX) 40 MG tablet Take 1 tablet (40 mg total) by mouth daily. 30 tablet 6 03/18/2015 at Unknown time  . potassium chloride SA (K-DUR,KLOR-CON) 20 MEQ tablet Take 1 tablet (20 mEq total) by mouth daily. 30 tablet 5 03/17/2015 at Unknown time  . amLODipine-benazepril (LOTREL) 5-20 MG per capsule TAKE 2 CAPSULES BY MOUTH EVERY DAY 60 capsule 6 03/17/2015  . aspirin EC 81 MG tablet Take 81 mg by mouth at bedtime.   03/17/2015  . ezetimibe-simvastatin (VYTORIN) 10-40 MG per tablet Take 1 tablet by mouth at bedtime.    03/17/2015  . fexofenadine (ALLEGRA) 180 MG tablet Take 180  mg by mouth daily.    03/17/2015  . furosemide (LASIX) 40 MG tablet Take 1 tablet (40 mg total) by mouth daily. 30 tablet 11 03/18/2015  . isosorbide mononitrate (IMDUR) 60 MG 24 hr tablet TAKE 1 AND 1/2 TABLET (90 MG TOTAL) BY MOUTH DAILY. 45 tablet 5 03/18/2015  . metoprolol (LOPRESSOR) 50 MG tablet Take 50 mg by mouth 2 (two) times daily.   03/16/2015 at 0800  . nitroGLYCERIN (NITROSTAT) 0.4 MG SL tablet TAKE 1 TABLET UNDER THE TONGUE AS NEEDEDFOR CHEST PAIN ( MAY REPEAT EVERY 5 MINUTES X 3) 25 tablet 6 03/17/2015  . Omega-3 Fatty Acids (FISH OIL) 1000 MG CAPS Take 1,000 mg by mouth 2 (two) times daily.     03/18/2015    Assessment: 69 y.o. male presents with SOB and EKG changes. Noted pt with significant cardiac history - CAD (s/p CABG 1993, Multiple stents), syst HF, mild-moderate AS, HTN and HL. Heparin started for r/o ACS. Likely to cath tomorrow if  renal fx stable. H/H and plt ok at baseline. Heparin drip 800 uts/hr HL < 0.1 < goal.  No bleeding noted.    Goal of Therapy:  Heparin level 0.3-0.7 units/ml Monitor platelets by anticoagulation protocol: Yes   Plan:   Increase Heparin IV gtt at 800 units/hr Daily heparin level and CBC  Bonnita Nasuti Pharm.D. CPP, BCPS Clinical Pharmacist [phone] 03/19/2015 6:08 PM

## 2015-03-19 NOTE — Progress Notes (Signed)
Paged Ignacia Bayley concerning drop in BP.  New orders were put in to change the rate of Nitro, and to give IV fluids at 38ml/hour.

## 2015-03-19 NOTE — Progress Notes (Signed)
Dr Tommi Rumps in room. 2nd EKG done. Updated MD re recent VS. Pt claims to feel better.continued to observe pt closely

## 2015-03-19 NOTE — Progress Notes (Signed)
CRITICAL VALUE ALERT  Critical value received:  0.74 Troponin  Date of notification:  03/19/2015  Time of notification:  1024  Critical value read back:YES  Nurse who received alert:  Sherene Sires, Rn  MD notified (1st page):  Dr. Angelena Form  Time of first page:  1030  MD notified (2nd page):  Time of second page:  Responding MD: Dr. Angelena Form  Time MD responded:  [phone]

## 2015-03-19 NOTE — Progress Notes (Signed)
ANTICOAGULATION CONSULT NOTE - Initial Consult  Pharmacy Consult for Heparin Indication: chest pain/ACS  No Known Allergies  Patient Measurements: Height: 5\' 5"  (165.1 cm) Weight: 144 lb 11.2 oz (65.635 kg) IBW/kg (Calculated) : 61.5 Heparin Dosing Weight: 65 kg  Vital Signs: Temp: 97.6 F (36.4 C) (06/01 0449) Temp Source: Oral (06/01 0449) BP: 150/81 mmHg (06/01 0552) Pulse Rate: 104 (06/01 0552)  Labs:  Recent Labs  03/19/15 0304  HGB 12.0*  HCT 36.2*  PLT 246  CREATININE 2.03*  TROPONINI 0.17*    Estimated Creatinine Clearance: 29.5 mL/min (by C-G formula based on Cr of 2.03).   Medical History: Past Medical History  Diagnosis Date  . Hypertension   . Hyperlipoproteinemia   . Aortic stenosis     a. Mod AS/AI by cath 07/2012.;  b.  Echo (07/31/13) is: EF 55-60%, mild aortic stenosis (mean gradient 13);  c. Echo (5/15):  EF 40-45%, mild AS (mean 12 mmHg), mod AI  . Back pain   . Carotid artery occlusion     a. Dopplers 07/2012: no significant high grade obstruction.  . Hyperlipidemia   . Abnormal CT scan, kidney     07/2012 - multiple small cysts  . Cholelithiasis     Seen on CT 07/2012  . Coronary artery disease     a. CABG 10/1991. b. cath 07/2012 with prog dsz, turned down for re-do CABG - for med rx for now.; c. NSTEMI/PCI: DES to mLAD ext into IMA graft;  d. Inf STEMI (5/15):  LM 60-70% then 99% before LAD, pLAD occl, pCFX stent 80+% ISR, oRCA 99% then occl (L-R collats to dRCA), L-LAD ok with patent stent, S-CFX occl (old), S-RCA occl (old); PCI: LM ext into CFX with Xience Alpine DES  . Ischemic cardiomyopathy     a. 2D ECHO: 08/02/2014; EF 45%; severe hypokinesis base/mid-inferolat segments and severe hypokinesis base inferior segment. Mild LVH, mild AS (may be underestimated due to LV dysfunction).  Mean gradient (S): 14 mm Hg. Peak gradient (S): 25 mm Hg. VTI ratio of LVOT to aortic valve: 0.37. Mod LA dilation. Mild RV systolic dysfxn      Medications:  Prescriptions prior to admission  Medication Sig Dispense Refill Last Dose  . amLODipine-benazepril (LOTREL) 5-20 MG per capsule TAKE 2 CAPSULES BY MOUTH EVERY DAY 60 capsule 6 Taking  . aspirin EC 81 MG tablet Take 81 mg by mouth at bedtime.   Taking  . clopidogrel (PLAVIX) 75 MG tablet TAKE 1 TABLET (75 MG TOTAL) BY MOUTH DAILY WITH BREAKFAST. 30 tablet 11 Taking  . ezetimibe-simvastatin (VYTORIN) 10-40 MG per tablet Take 1 tablet by mouth at bedtime.    Taking  . fexofenadine (ALLEGRA) 180 MG tablet Take 180 mg by mouth daily as needed for allergies.    Taking  . furosemide (LASIX) 40 MG tablet Take 1 tablet (40 mg total) by mouth daily. 30 tablet 11 Taking  . isosorbide mononitrate (IMDUR) 60 MG 24 hr tablet TAKE 1 AND 1/2 TABLET (90 MG TOTAL) BY MOUTH DAILY. 45 tablet 5 Taking  . metoprolol (LOPRESSOR) 50 MG tablet Take 50 mg by mouth 2 (two) times daily.   Taking  . nitroGLYCERIN (NITROSTAT) 0.4 MG SL tablet TAKE 1 TABLET UNDER THE TONGUE AS NEEDEDFOR CHEST PAIN ( MAY REPEAT EVERY 5 MINUTES X 3) 25 tablet 6 Taking  . Omega-3 Fatty Acids (FISH OIL) 1000 MG CAPS Take 1,000 mg by mouth 2 (two) times daily.     Taking  .  pantoprazole (PROTONIX) 40 MG tablet Take 1 tablet (40 mg total) by mouth daily. 30 tablet 6   . potassium chloride SA (K-DUR,KLOR-CON) 20 MEQ tablet Take 1 tablet (20 mEq total) by mouth daily. 30 tablet 5 Taking  . spironolactone (ALDACTONE) 25 MG tablet Take 0.5 tablets (12.5 mg total) by mouth 2 (two) times daily. 30 tablet 5 Taking    Assessment: 69 y.o. male presents with SOB and EKG changes. Noted pt with significant cardiac history - CAD (s/p CABG 1993, Multiple stents), syst HF, mild-moderate AS, HTN and HL. To begin heparin for r/o ACS. Likely to cath today. H/H and plt ok at baseline.  Goal of Therapy:  Heparin level 0.3-0.7 units/ml Monitor platelets by anticoagulation protocol: Yes   Plan:  D/c SQ heparin Heparin IV bolus 4000  units Heparin IV gtt at 800 units/hr Heparin level in 8 hours Daily heparin level and CBC  Sherlon Handing, PharmD, BCPS Clinical pharmacist, pager [phone] 03/19/2015,6:10 AM

## 2015-03-19 NOTE — Progress Notes (Signed)
Troponin 0.74 called in from Lab. Called received from [employer].  Paged Dr. Angelena Form regarding lab result.

## 2015-03-19 NOTE — Progress Notes (Signed)
At 1500 introduced self to pt as incoming nurse til 7pm.  Call bell at reach.  Instructed to call for assistance as needed.  Pt verbalized understanding.  Karie Kirks, Therapist, sports.

## 2015-03-19 NOTE — Progress Notes (Addendum)
Patient Name: Jos Cygan Doubrava Date of Encounter: 03/19/2015    Principal Problem:   NSTEMI (non-ST elevated myocardial infarction) Active Problems:   CABG '98, cath Oct 2014- LAD DES placed.   Coronary artery disease   Acute coronary syndrome   SOB (shortness of breath)   Essential hypertension   Aortic stenosis    Aortic insufficiency   Tobacco abuse   Chronic systolic CHF (congestive heart failure)   Hyperlipidemia    SUBJECTIVE  Had c/p this AM following admission associated with sinus tach and lateral ST changes.  Now pain free on heparin and ntg.  Troponin bumping.  CURRENT MEDS . amLODipine  5 mg Oral Daily  . aspirin EC  81 mg Oral QHS  . [START ON 03/20/2015] ezetimibe  10 mg Oral QHS   And  . [START ON 03/20/2015] atorvastatin  20 mg Oral QHS  . clopidogrel  75 mg Oral Daily  . ipratropium-albuterol  3 mL Nebulization Q4H  . isosorbide mononitrate  60 mg Oral Daily  . lisinopril  20 mg Oral Daily  . loratadine  10 mg Oral Daily  . metoprolol  50 mg Oral BID  .  morphine injection  4 mg Intravenous Once  . nitroGLYCERIN      . nitroGLYCERIN      . omega-3 acid ethyl esters  1 g Oral Daily  . pantoprazole  40 mg Oral Daily  . potassium chloride SA  20 mEq Oral Daily  . spironolactone  12.5 mg Oral BID   OBJECTIVE  Filed Vitals:   03/19/15 0552 03/19/15 0904 03/19/15 1055 03/19/15 1147  BP: 150/81  112/59   Pulse: 104  80   Temp:   97.4 F (36.3 C)   TempSrc:   Oral   Resp: 18  18   Height:      Weight:      SpO2: 97% 97% 94% 94%    Intake/Output Summary (Last 24 hours) at 03/19/15 1231 Last data filed at 03/19/15 0838  Gross per 24 hour  Intake    750 ml  Output    300 ml  Net    450 ml   Filed Weights   03/18/15 2245 03/19/15 0449  Weight: 144 lb 3.2 oz (65.409 kg) 144 lb 11.2 oz (65.635 kg)    PHYSICAL EXAM  General: Pleasant, NAD. Neuro: Alert and oriented X 3. Moves all extremities spontaneously. Psych: Normal affect. HEENT:   Normal  Neck: Supple without bruits.  jvp to jaw. Lungs:  Resp regular and unlabored, markedly diminished breath sounds bilat. Heart: RRR no s3, s4, 3/6 SEM RUSB, 2/6 syst murmur @ apex. Abdomen: Soft, non-tender, non-distended, BS + x 4.  Extremities: No clubbing, cyanosis or edema. DP/PT/Radials 1+ and equal bilaterally.  Accessory Clinical Findings  CBC  Recent Labs  03/19/15 0304  WBC 15.2*  HGB 12.0*  HCT 36.2*  MCV 96.5  PLT 643   Basic Metabolic Panel  Recent Labs  03/19/15 0304  CREATININE 2.03*   Cardiac Enzymes  Recent Labs  03/19/15 0304 03/19/15 0911 03/19/15 1030  TROPONINI 0.17* 0.74* 1.38*   TELE  Rsr, sinus tach, pvc's.  ECG  Sinus tach, 108, lvh, lat st dep  Radiology/Studies  No results found.  ASSESSMENT AND PLAN  1.  NSTEMI/CAD:  H/o CAD and cabg s/p inferior STEMI in 02/2014 with POBA of the LM into the LCX in setting of patent LIMA->LAD.  Most recent cath 02/12/2015  patent LM/LCX with diffuse  30% restenosis.  LAD/LCX/RCA known to be 100%.  LIMA remains patent with known occlusion of the VG's to the PDA and OM.  Ef 35-40%.  Presented with dyspnea, ECG changes, troponin elevation with subsequent development of c/p and tachycardia with worsened ST changes.  Currently pain free.  Troponin bumping  0.17 - 0.74 - 1.38. Cont asa, plavix, bb, statin, heparin, iv ntg.  Hold acei and spiro in setting of creat of 2.03.  Repeat bmet in am.  Gently hydrate tonight with plan for repeat cath tomorrow.  2.  Chronic systolic CHF:  EF 96% in 22/2979.  His wt is stable though he does have JVD.  Will need to be careful with precath hydration.  Cont bb.  Hold acei/spiro in setting of AKI.  3.  AKI:  Creat 2.03 (baseline 2.3).  F/U full bmet in am.  Holding acei/spiro.  4.  AECOPD/Tob Abuse:  Diminished breath sounds and scattered rhonchi on exam.  CXR non-acute from OSH.  Afebrile.  WBC up but received solumedrol @ outside ED.  Had gotten a dose of levaquin.   Cont inhalers.  Counseled on smoking cessation.  5. Essential HTN: stable.  6.  HL:  Cont statin.  Signed, [name] NP   Patient seen, examined. Available data reviewed. Agree with findings, assessment, and plan as outlined by Ignacia Bayley, NP. Patient independently interviewed and examined. He has poor air movement bilaterally. Heart sounds are distant with a grade 2/6 harsh late peaking systolic murmur at the left sternal border. There is no peripheral edema. Troponins are positive in EKG with dynamic ST changes. He is now chest pain-free on heparin and nitroglycerin. With acute kidney injury, need to hold ACE inhibitor and Aldactone. Will gently hydrate overnight with plans for cardiac catheterization and possible PTCA/stenting tomorrow pending lab results. Patient understands to notify nursing immediately if he has further chest pain. Add p2y12 testing to lab to make sure he is responsive to plavix.  Sherren Mocha, M.D. 03/19/2015 1:05 PM

## 2015-03-19 NOTE — Progress Notes (Signed)
Levaquin DC'd as per DR Tommi Rumps since pt claims chest pains started 10 min after Levaquin started infusing. Noted few reddened rashes over body. No c/o itchiness nor SOB.

## 2015-03-19 NOTE — Progress Notes (Signed)
NTG drip started at 25 mcg/min as ordered.pt claims chest pain 1/10 feels more relaxed .continued to observed closely

## 2015-03-20 ENCOUNTER — Other Ambulatory Visit: Payer: Self-pay

## 2015-03-20 ENCOUNTER — Encounter (HOSPITAL_COMMUNITY): Payer: Self-pay | Admitting: *Deleted

## 2015-03-20 ENCOUNTER — Inpatient Hospital Stay (HOSPITAL_COMMUNITY): Payer: Medicare Other

## 2015-03-20 ENCOUNTER — Encounter (HOSPITAL_COMMUNITY)
Admission: AD | Disposition: A | Discharge status: Home / Self Care | Payer: Medicare Other | Source: Other Acute Inpatient Hospital | Attending: Cardiovascular Disease

## 2015-03-20 DIAGNOSIS — N289 Disorder of kidney and ureter, unspecified: Secondary | ICD-10-CM

## 2015-03-20 DIAGNOSIS — I2510 Atherosclerotic heart disease of native coronary artery without angina pectoris: Secondary | ICD-10-CM

## 2015-03-20 HISTORY — PX: CARDIAC CATHETERIZATION: SHX172

## 2015-03-20 LAB — CBC
HCT: 38.4 % — ABNORMAL LOW (ref 39.0–52.0)
Hemoglobin: 12.6 g/dL — ABNORMAL LOW (ref 13.0–17.0)
MCH: 32.1 pg (ref 26.0–34.0)
MCHC: 32.8 g/dL (ref 30.0–36.0)
MCV: 98 fL (ref 78.0–100.0)
Platelets: 270 10*3/uL (ref 150–400)
RBC: 3.92 MIL/uL — ABNORMAL LOW (ref 4.22–5.81)
RDW: 13.1 % (ref 11.5–15.5)
WBC: 18.6 10*3/uL — ABNORMAL HIGH (ref 4.0–10.5)

## 2015-03-20 LAB — BASIC METABOLIC PANEL
ANION GAP: 10 (ref 5–15)
Anion gap: 12 (ref 5–15)
BUN: 39 mg/dL — ABNORMAL HIGH (ref 6–20)
BUN: 35 mg/dL — ABNORMAL HIGH (ref 6–20)
CHLORIDE: 101 mmol/L (ref 101–111)
CO2: 25 mmol/L (ref 22–32)
CO2: 21 mmol/L — AB (ref 22–32)
CREATININE: 1.78 mg/dL — AB (ref 0.61–1.24)
CREATININE: 1.67 mg/dL — AB (ref 0.61–1.24)
Calcium: 8.9 mg/dL (ref 8.9–10.3)
Calcium: 8.7 mg/dL — ABNORMAL LOW (ref 8.9–10.3)
Chloride: 103 mmol/L (ref 101–111)
GFR calc non Af Amer: 37 mL/min — ABNORMAL LOW (ref >60)
GFR calc non Af Amer: 40 mL/min — ABNORMAL LOW (ref >60)
GFR, EST AFRICAN AMERICAN: 43 mL/min — AB (ref >60)
GFR, EST AFRICAN AMERICAN: 46 mL/min — AB (ref >60)
GLUCOSE: 115 mg/dL — AB (ref 65–99)
GLUCOSE: 109 mg/dL — AB (ref 65–99)
POTASSIUM: 4.3 mmol/L (ref 3.5–5.1)
Potassium: 4 mmol/L (ref 3.5–5.1)
SODIUM: 136 mmol/L (ref 135–145)
Sodium: 136 mmol/L (ref 135–145)

## 2015-03-20 LAB — CBC WITH DIFFERENTIAL/PLATELET
BASOS ABS: 0 10*3/uL (ref 0.0–0.1)
BASOS PCT: 0 % (ref 0–1)
Eosinophils Absolute: 0 10*3/uL (ref 0.0–0.7)
Eosinophils Relative: 0 % (ref 0–5)
HCT: 39.8 % (ref 39.0–52.0)
Hemoglobin: 13.1 g/dL (ref 13.0–17.0)
Lymphocytes Relative: 9 % — ABNORMAL LOW (ref 12–46)
Lymphs Abs: 1.3 10*3/uL (ref 0.7–4.0)
MCH: 32 pg (ref 26.0–34.0)
MCHC: 32.9 g/dL (ref 30.0–36.0)
MCV: 97.3 fL (ref 78.0–100.0)
Monocytes Absolute: 0.6 10*3/uL (ref 0.1–1.0)
Monocytes Relative: 4 % (ref 3–12)
NEUTROS ABS: 12.8 10*3/uL — AB (ref 1.7–7.7)
Neutrophils Relative %: 87 % — ABNORMAL HIGH (ref 43–77)
Platelets: 263 10*3/uL (ref 150–400)
RBC: 4.09 MIL/uL — AB (ref 4.22–5.81)
RDW: 13.1 % (ref 11.5–15.5)
WBC: 14.7 10*3/uL — AB (ref 4.0–10.5)

## 2015-03-20 LAB — MRSA PCR SCREENING: MRSA by PCR: POSITIVE — AB

## 2015-03-20 LAB — HEPARIN LEVEL (UNFRACTIONATED): HEPARIN UNFRACTIONATED: 0.1 [IU]/mL — AB (ref 0.30–0.70)

## 2015-03-20 LAB — PROTIME-INR
INR: 1.08 (ref 0.00–1.49)
PROTHROMBIN TIME: 14.2 s (ref 11.6–15.2)

## 2015-03-20 SURGERY — LEFT HEART CATH AND CORS/GRAFTS ANGIOGRAPHY

## 2015-03-20 MED ORDER — ASPIRIN 81 MG PO CHEW
81.0000 mg | CHEWABLE_TABLET | ORAL | Status: AC
  Administered 2015-03-20: 81 mg via ORAL
  Filled 2015-03-20: qty 1

## 2015-03-20 MED ORDER — SODIUM CHLORIDE 0.9 % IV SOLN: 250.0000 mL | INTRAVENOUS | Status: DC | PRN

## 2015-03-20 MED ORDER — HEPARIN BOLUS VIA INFUSION
3000.0000 [IU] | Freq: Once | INTRAVENOUS | Status: AC
  Administered 2015-03-20: 3000 [IU] via INTRAVENOUS
  Filled 2015-03-20: qty 3000

## 2015-03-20 MED ORDER — ONDANSETRON HCL 4 MG/2ML IJ SOLN: 4.0000 mg | Freq: Four times a day (QID) | INTRAMUSCULAR | Status: DC | PRN

## 2015-03-20 MED ORDER — FUROSEMIDE 10 MG/ML IJ SOLN
INTRAMUSCULAR | Status: AC
  Filled 2015-03-20: qty 4

## 2015-03-20 MED ORDER — FENTANYL CITRATE (PF) 100 MCG/2ML IJ SOLN
INTRAMUSCULAR | Status: DC | PRN
  Administered 2015-03-20: 25 ug via INTRAVENOUS

## 2015-03-20 MED ORDER — ACETAMINOPHEN 325 MG PO TABS: 650.0000 mg | ORAL_TABLET | ORAL | Status: DC | PRN

## 2015-03-20 MED ORDER — SODIUM CHLORIDE 0.9 % IJ SOLN: 3.0000 mL | INTRAMUSCULAR | Status: DC | PRN

## 2015-03-20 MED ORDER — CHLORHEXIDINE GLUCONATE CLOTH 2 % EX PADS
6.0000 | MEDICATED_PAD | Freq: Every day | CUTANEOUS | Status: DC
  Administered 2015-03-21 – 2015-03-23 (×3): 6 via TOPICAL

## 2015-03-20 MED ORDER — SODIUM CHLORIDE 0.9 % IV SOLN
INTRAVENOUS | Status: DC
  Administered 2015-03-20: 06:00:00 via INTRAVENOUS

## 2015-03-20 MED ORDER — HYDROCOD POLST-CPM POLST ER 10-8 MG/5ML PO SUER
5.0000 mL | Freq: Two times a day (BID) | ORAL | Status: DC | PRN
  Administered 2015-03-20 – 2015-03-21 (×3): 5 mL via ORAL
  Filled 2015-03-20 (×3): qty 5

## 2015-03-20 MED ORDER — FAMOTIDINE 10 MG PO TABS
10.0000 mg | ORAL_TABLET | Freq: Every day | ORAL | Status: DC
  Administered 2015-03-20 – 2015-03-21 (×2): 10 mg via ORAL
  Filled 2015-03-20 (×3): qty 1

## 2015-03-20 MED ORDER — MUPIROCIN 2 % EX OINT
1.0000 "application " | TOPICAL_OINTMENT | Freq: Two times a day (BID) | CUTANEOUS | Status: DC
  Administered 2015-03-20 – 2015-03-23 (×6): 1 via NASAL
  Filled 2015-03-20: qty 22

## 2015-03-20 MED ORDER — SODIUM CHLORIDE 0.9 % IJ SOLN
3.0000 mL | INTRAMUSCULAR | Status: DC | PRN
Start: 2015-03-20 — End: 2015-03-20

## 2015-03-20 MED ORDER — HEPARIN (PORCINE) IN NACL 2-0.9 UNIT/ML-% IJ SOLN
INTRAMUSCULAR | Status: AC
  Filled 2015-03-20: qty 1000

## 2015-03-20 MED ORDER — LIDOCAINE HCL (PF) 1 % IJ SOLN
INTRAMUSCULAR | Status: AC
  Filled 2015-03-20: qty 30

## 2015-03-20 MED ORDER — MIDAZOLAM HCL 2 MG/2ML IJ SOLN
INTRAMUSCULAR | Status: DC | PRN
  Administered 2015-03-20: 2 mg via INTRAVENOUS

## 2015-03-20 MED ORDER — IOHEXOL 350 MG/ML SOLN
INTRAVENOUS | Status: DC | PRN
Start: 2015-03-20 — End: 2015-03-20
  Administered 2015-03-20: 75 mL via INTRA_ARTERIAL

## 2015-03-20 MED ORDER — SODIUM CHLORIDE 0.9 % IJ SOLN: 3.0000 mL | Freq: Two times a day (BID) | INTRAMUSCULAR | Status: DC

## 2015-03-20 MED ORDER — HEPARIN SODIUM (PORCINE) 1000 UNIT/ML IJ SOLN
INTRAMUSCULAR | Status: DC | PRN
  Administered 2015-03-20: 3000 [IU] via INTRAVENOUS

## 2015-03-20 MED ORDER — HEPARIN SODIUM (PORCINE) 5000 UNIT/ML IJ SOLN: 5000.0000 [IU] | Freq: Three times a day (TID) | INTRAMUSCULAR | Status: DC

## 2015-03-20 MED ORDER — ASPIRIN EC 81 MG PO TBEC
81.0000 mg | DELAYED_RELEASE_TABLET | Freq: Every day | ORAL | Status: DC
  Administered 2015-03-21 – 2015-03-22 (×2): 81 mg via ORAL
  Filled 2015-03-20 (×4): qty 1

## 2015-03-20 MED ORDER — FUROSEMIDE 10 MG/ML IJ SOLN
40.0000 mg | Freq: Once | INTRAMUSCULAR | Status: AC
  Administered 2015-03-20: 40 mg via INTRAVENOUS

## 2015-03-20 MED ORDER — HEPARIN SODIUM (PORCINE) 1000 UNIT/ML IJ SOLN
INTRAMUSCULAR | Status: AC
  Filled 2015-03-20: qty 1

## 2015-03-20 MED ORDER — VERAPAMIL HCL 2.5 MG/ML IV SOLN
INTRAVENOUS | Status: DC | PRN
  Administered 2015-03-20: 16:00:00 via INTRA_ARTERIAL

## 2015-03-20 MED ORDER — MIDAZOLAM HCL 2 MG/2ML IJ SOLN
INTRAMUSCULAR | Status: AC
  Filled 2015-03-20: qty 2

## 2015-03-20 MED ORDER — FUROSEMIDE 10 MG/ML IJ SOLN
INTRAMUSCULAR | Status: DC | PRN
  Administered 2015-03-20: 40 mg via INTRAVENOUS

## 2015-03-20 MED ORDER — VERAPAMIL HCL 2.5 MG/ML IV SOLN
INTRAVENOUS | Status: AC
  Filled 2015-03-20: qty 2

## 2015-03-20 MED ORDER — FENTANYL CITRATE (PF) 100 MCG/2ML IJ SOLN
INTRAMUSCULAR | Status: AC
  Filled 2015-03-20: qty 2

## 2015-03-20 MED ORDER — SODIUM CHLORIDE 0.9 % IJ SOLN
3.0000 mL | Freq: Two times a day (BID) | INTRAMUSCULAR | Status: DC
  Administered 2015-03-20 – 2015-03-23 (×6): 3 mL via INTRAVENOUS

## 2015-03-20 SURGICAL SUPPLY — 17 items
CATH INFINITI 5 FR IM (CATHETERS) ×3
CATH INFINITI 5 FR JL3.5 (CATHETERS)
CATH INFINITI 5FR ANG PIGTAIL (CATHETERS)
CATH INFINITI 5FR JL4 (CATHETERS) ×3
CATH INFINITI 5FR MULTPACK ANG (CATHETERS)
CATH INFINITI JR4 5F (CATHETERS) ×3
DEVICE RAD COMP TR BAND LRG (VASCULAR PRODUCTS) ×3
GLIDESHEATH SLEND SS 6F .021 (SHEATH) ×3
KIT HEART LEFT (KITS) ×3
PACK CARDIAC CATHETERIZATION (CUSTOM PROCEDURE TRAY) ×3
SHEATH PINNACLE 5F 10CM (SHEATH)
SYR MEDRAD MARK V 150ML (SYRINGE) ×3
TRANSDUCER W/STOPCOCK (MISCELLANEOUS) ×3
TUBING CIL FLEX 10 FLL-RA (TUBING) ×3
WIRE EMERALD 3MM-J .035X150CM (WIRE)
WIRE HI TORQ VERSACORE-J 145CM (WIRE) ×3
WIRE SAFE-T 1.5MM-J .035X260CM (WIRE) ×3

## 2015-03-20 NOTE — Interval H&P Note (Signed)
Cath Lab Visit (complete for each Cath Lab visit)  Clinical Evaluation Leading to the Procedure:   ACS: Yes.    Non-ACS:    Anginal Classification: CCS IV  Anti-ischemic medical therapy: Maximal Therapy (2 or more classes of medications)  Non-Invasive Test Results: No non-invasive testing performed  Prior CABG: Previous CABG   TIMI Score  Patient Information:  TIMI Score is 4   UA/NSTEMI and intermediate-risk features (e.g., TIMI score 3-4) for short-term risk of death or nonfatal MI  Revascularization of the presumed culprit artery   A (8)  Indication: 10; Score: 8    History and Physical Interval Note:  03/20/2015 3:58 PM  Steve Durham  has presented today for surgery, with the diagnosis of cp  The various methods of treatment have been discussed with the patient and family. After consideration of risks, benefits and other options for treatment, the patient has consented to  Procedure(s): Left Heart Cath and Cors/Grafts Angiography (N/A) as a surgical intervention .  The patient's history has been reviewed, patient examined, no change in status, stable for surgery.  I have reviewed the patient's chart and labs.  Questions were answered to the patient's satisfaction.     Steve Durham S.

## 2015-03-20 NOTE — Progress Notes (Signed)
1816  03/20/2015     HR down to ST at 108 BPM.  BP 116/70 on 3 mcg ntg per min.   Cough is now rare. Pt has voided only 350cc and states he does not feel the need to void. Wife is at bedside.

## 2015-03-20 NOTE — H&P (View-Only) (Signed)
Subjective:  SOB improved. He denies chest pain, fever, chills, or dysuria  Objective:  Vital Signs in the last 24 hours: Temp:  [97.9 F (36.6 C)-98.4 F (36.9 C)] 98.2 F (36.8 C) (06/02 0622) Pulse Rate:  [70-78] 72 (06/02 0622) Resp:  [18] 18 (06/02 0622) BP: (89-117)/(45-62) 108/52 mmHg (06/02 0622) SpO2:  [90 %-98 %] 95 % (06/02 0622) Weight:  [149 lb 7.6 oz (67.8 kg)] 149 lb 7.6 oz (67.8 kg) (06/02 0622)  Intake/Output from previous day:  Intake/Output Summary (Last 24 hours) at 03/20/15 1059 Last data filed at 03/20/15 0901  Gross per 24 hour  Intake 1848.82 ml  Output    950 ml  Net 898.82 ml    Physical Exam: General appearance: alert, cooperative and no distress Lungs: clear to auscultation bilaterally Heart: regular rate and rhythm and 2/6 honking systolic murmur AOv area Extremities: no edema   Rate: 72  Rhythm: normal sinus rhythm  Lab Results:  Recent Labs  03/19/15 0304 03/20/15 0639  WBC 15.2* 18.6*  HGB 12.0* 12.6*  PLT 246 270    Recent Labs  03/19/15 0304 03/20/15 0639  NA  --  136  K  --  4.3  CL  --  101  CO2  --  25  GLUCOSE  --  115*  BUN  --  39*  CREATININE 2.03* 1.78*    Recent Labs  03/19/15 0911 03/19/15 1030  TROPONINI 0.74* 1.38*    Recent Labs  03/20/15 0639  INR 1.08    Scheduled Meds: . amLODipine  5 mg Oral Daily  . [START ON 03/21/2015] aspirin EC  81 mg Oral QHS  . ezetimibe  10 mg Oral QHS   And  . atorvastatin  20 mg Oral QHS  . clopidogrel  75 mg Oral Daily  . ipratropium-albuterol  3 mL Nebulization Q4H  . loratadine  10 mg Oral Daily  . metoprolol  50 mg Oral BID  .  morphine injection  4 mg Intravenous Once  . omega-3 acid ethyl esters  1 g Oral Daily  . pantoprazole  40 mg Oral Daily  . sodium chloride  3 mL Intravenous Q12H   Continuous Infusions: . sodium chloride 50 mL/hr at 03/20/15 0530  . sodium chloride 50 mL/hr at 03/20/15 0900  . heparin 1,300 Units/hr (03/20/15 0859)  .  nitroGLYCERIN 10 mcg/min (03/20/15 0900)   PRN Meds:.sodium chloride, acetaminophen, ondansetron (ZOFRAN) IV, sodium chloride   Imaging: No results found.   Assessment/Plan:  69 y/o with a h/o CAD, s/p  CABG '98,  s/p inferior STEMI in 02/2014 Rx'd with POBA of the LM into the LCX. In the setting of a patent LIMA->LAD. Most recent cath 02/12/2015  patent LM/LCX with diffuse 30% restenosis. LAD/LCX/RCA known to be 100%. LIMA remains patent with known occlusion of the VG's to the PDA and OM. Ef 35-40%. Pt presented 03/18/15 with dyspnea, ECG changes, troponin elevation with subsequent development of c/p and tachycardia with worsened ST changes.He also had acute renal insufficiency and leukocytosis.    Principal Problem:   NSTEMI (non-ST elevated myocardial infarction) Active Problems:   CABG '98, cath Oct 2014- LAD DES placed.   SOB (shortness of breath)   Essential hypertension   Aortic stenosis, mild Oct 7253   Chronic systolic CHF (congestive heart failure)   Acute renal insufficiency   Aortic stenosis    Hyperlipidemia   Tobacco abuse   PLAN: Check CXR with WBC 18K. F/U CBC with  diff in am. He is on for cath later today, SCr down to 1.78.   Kerin Ransom PA-C 03/20/2015, 10:59 AM [phone]  Patient seen, examined. Available data reviewed. Agree with findings, assessment, and plan as outlined by Kerin Ransom, PA-C. On my evaluation he is complaining of more coughing and worsening shortness of breath. Lungs continue to show diminished breath sounds and scattered rhonchi. Heart is distant with a grade 2/6 systolic murmur at the left sternal border. Chest x-ray reviewed and there is no evidence of infiltrate or pulmonary edema. Plans for cardiac catheterization and possible PCI later today. Further plans pending cardiac catheterization results. Because of his elevated creatinine he will probably not have a left ventriculogram performed today. Will update an echocardiogram to evaluate  his worsening shortness of breath. I suspect his breathing problems are primarily related to heavy tobacco with COPD exacerbation.  Sherren Mocha, M.D. 03/20/2015 12:11 PM

## 2015-03-20 NOTE — Progress Notes (Signed)
Subjective:  SOB improved. He denies chest pain, fever, chills, or dysuria  Objective:  Vital Signs in the last 24 hours: Temp:  [97.9 F (36.6 C)-98.4 F (36.9 C)] 98.2 F (36.8 C) (06/02 0622) Pulse Rate:  [70-78] 72 (06/02 0622) Resp:  [18] 18 (06/02 0622) BP: (89-117)/(45-62) 108/52 mmHg (06/02 0622) SpO2:  [90 %-98 %] 95 % (06/02 0622) Weight:  [149 lb 7.6 oz (67.8 kg)] 149 lb 7.6 oz (67.8 kg) (06/02 0622)  Intake/Output from previous day:  Intake/Output Summary (Last 24 hours) at 03/20/15 1059 Last data filed at 03/20/15 0901  Gross per 24 hour  Intake 1848.82 ml  Output    950 ml  Net 898.82 ml    Physical Exam: General appearance: alert, cooperative and no distress Lungs: clear to auscultation bilaterally Heart: regular rate and rhythm and 2/6 honking systolic murmur AOv area Extremities: no edema   Rate: 72  Rhythm: normal sinus rhythm  Lab Results:  Recent Labs  03/19/15 0304 03/20/15 0639  WBC 15.2* 18.6*  HGB 12.0* 12.6*  PLT 246 270    Recent Labs  03/19/15 0304 03/20/15 0639  NA  --  136  K  --  4.3  CL  --  101  CO2  --  25  GLUCOSE  --  115*  BUN  --  39*  CREATININE 2.03* 1.78*    Recent Labs  03/19/15 0911 03/19/15 1030  TROPONINI 0.74* 1.38*    Recent Labs  03/20/15 0639  INR 1.08    Scheduled Meds: . amLODipine  5 mg Oral Daily  . [START ON 03/21/2015] aspirin EC  81 mg Oral QHS  . ezetimibe  10 mg Oral QHS   And  . atorvastatin  20 mg Oral QHS  . clopidogrel  75 mg Oral Daily  . ipratropium-albuterol  3 mL Nebulization Q4H  . loratadine  10 mg Oral Daily  . metoprolol  50 mg Oral BID  .  morphine injection  4 mg Intravenous Once  . omega-3 acid ethyl esters  1 g Oral Daily  . pantoprazole  40 mg Oral Daily  . sodium chloride  3 mL Intravenous Q12H   Continuous Infusions: . sodium chloride 50 mL/hr at 03/20/15 0530  . sodium chloride 50 mL/hr at 03/20/15 0900  . heparin 1,300 Units/hr (03/20/15 0859)  .  nitroGLYCERIN 10 mcg/min (03/20/15 0900)   PRN Meds:.sodium chloride, acetaminophen, ondansetron (ZOFRAN) IV, sodium chloride   Imaging: No results found.   Assessment/Plan:  69 y/o with a h/o CAD, s/p  CABG '98,  s/p inferior STEMI in 02/2014 Rx'd with POBA of the LM into the LCX. In the setting of a patent LIMA->LAD. Most recent cath 02/12/2015  patent LM/LCX with diffuse 30% restenosis. LAD/LCX/RCA known to be 100%. LIMA remains patent with known occlusion of the VG's to the PDA and OM. Ef 35-40%. Pt presented 03/18/15 with dyspnea, ECG changes, troponin elevation with subsequent development of c/p and tachycardia with worsened ST changes.He also had acute renal insufficiency and leukocytosis.    Principal Problem:   NSTEMI (non-ST elevated myocardial infarction) Active Problems:   CABG '98, cath Oct 2014- LAD DES placed.   SOB (shortness of breath)   Essential hypertension   Aortic stenosis, mild Oct 7829   Chronic systolic CHF (congestive heart failure)   Acute renal insufficiency   Aortic stenosis    Hyperlipidemia   Tobacco abuse   PLAN: Check CXR with WBC 18K. F/U CBC with  diff in am. He is on for cath later today, SCr down to 1.78.   Kerin Ransom PA-C 03/20/2015, 10:59 AM [phone]  Patient seen, examined. Available data reviewed. Agree with findings, assessment, and plan as outlined by Kerin Ransom, PA-C. On my evaluation he is complaining of more coughing and worsening shortness of breath. Lungs continue to show diminished breath sounds and scattered rhonchi. Heart is distant with a grade 2/6 systolic murmur at the left sternal border. Chest x-ray reviewed and there is no evidence of infiltrate or pulmonary edema. Plans for cardiac catheterization and possible PCI later today. Further plans pending cardiac catheterization results. Because of his elevated creatinine he will probably not have a left ventriculogram performed today. Will update an echocardiogram to evaluate  his worsening shortness of breath. I suspect his breathing problems are primarily related to heavy tobacco with COPD exacerbation.  Sherren Mocha, M.D. 03/20/2015 12:11 PM

## 2015-03-20 NOTE — Care Management Note (Signed)
Case Management Note  Patient Details  Name: Steve Durham MRN: [id number] Date of Birth: 11-20-1945  Subjective/Objective:   Admitted with NSTEMI                 Action/Plan: CM following for DCP, for heart catherization today  Expected Discharge Date:    possibly 03/22/2015              Expected Discharge Plan:   home   Sherrilyn Rist [phone] 03/20/2015, 1:29 PM

## 2015-03-20 NOTE — Progress Notes (Signed)
BP 102/^^  ntg down to 13 mcg/m.

## 2015-03-20 NOTE — Progress Notes (Signed)
BP 95/64  ntg from 40 mcg/m down to 20 mcg/m.

## 2015-03-20 NOTE — Progress Notes (Signed)
ANTICOAGULATION CONSULT NOTE - Follow Up Consult  Pharmacy Consult for Heparin Indication: chest pain/ACS  No Known Allergies  Patient Measurements: Height: 5\' 5"  (165.1 cm) Weight: 149 lb 7.6 oz (67.8 kg) IBW/kg (Calculated) : 61.5 Heparin Dosing Weight: 65 kg  Vital Signs: Temp: 98.2 F (36.8 C) (06/02 0622) Temp Source: Oral (06/02 0622) BP: 108/52 mmHg (06/02 0622) Pulse Rate: 72 (06/02 0622)  Labs:  Recent Labs  03/19/15 0304 03/19/15 0911 03/19/15 1030 03/19/15 1518 03/20/15 0639  HGB 12.0*  --   --   --  12.6*  HCT 36.2*  --   --   --  38.4*  PLT 246  --   --   --  270  LABPROT  --   --   --   --  14.2  INR  --   --   --   --  1.08  HEPARINUNFRC  --   --   --  <0.10* 0.10*  CREATININE 2.03*  --   --   --  1.78*  TROPONINI 0.17* 0.74* 1.38*  --   --     Estimated Creatinine Clearance: 33.6 mL/min (by C-G formula based on Cr of 1.78).    Assessment: 69 y.o. male presented with SOB and EKG changes. Noted pt with significant cardiac history - CAD (s/p CABG 1993, Multiple stents), syst HF, mild-moderate AS, HTN and HL. Heparin started for r/o ACS. HL = 0.1 after rate increased to 1000 units/hr (lab repeated to verify)  Per RN, heparin infusing OK. HL below goal. CBC stable. No bleeding noted.    Goal of Therapy:  Heparin level 0.3-0.7 units/ml Monitor platelets by anticoagulation protocol: Yes   Plan:  Heparin 3000 unit bolus and increase drip to 1300 units/hr F/u after cath Daily heparin level and CBC  Eudelia Bunch, Pharm.D. [phone] 03/20/2015 8:45 AM

## 2015-03-21 ENCOUNTER — Inpatient Hospital Stay (HOSPITAL_COMMUNITY): Payer: Medicare Other

## 2015-03-21 ENCOUNTER — Encounter (HOSPITAL_COMMUNITY): Payer: Self-pay | Admitting: Interventional Cardiology

## 2015-03-21 DIAGNOSIS — I5023 Acute on chronic systolic (congestive) heart failure: Secondary | ICD-10-CM

## 2015-03-21 DIAGNOSIS — I509 Heart failure, unspecified: Secondary | ICD-10-CM

## 2015-03-21 LAB — BASIC METABOLIC PANEL
Anion gap: 8 (ref 5–15)
BUN: 28 mg/dL — ABNORMAL HIGH (ref 6–20)
CALCIUM: 8.7 mg/dL — AB (ref 8.9–10.3)
CO2: 25 mmol/L (ref 22–32)
Chloride: 103 mmol/L (ref 101–111)
Creatinine, Ser: 1.49 mg/dL — ABNORMAL HIGH (ref 0.61–1.24)
GFR calc Af Amer: 53 mL/min — ABNORMAL LOW (ref >60)
GFR calc non Af Amer: 46 mL/min — ABNORMAL LOW (ref >60)
Glucose, Bld: 118 mg/dL — ABNORMAL HIGH (ref 65–99)
POTASSIUM: 3.8 mmol/L (ref 3.5–5.1)
SODIUM: 136 mmol/L (ref 135–145)

## 2015-03-21 MED ORDER — FUROSEMIDE 10 MG/ML IJ SOLN
40.0000 mg | Freq: Two times a day (BID) | INTRAMUSCULAR | Status: DC
  Administered 2015-03-21 – 2015-03-22 (×2): 40 mg via INTRAVENOUS
  Filled 2015-03-21 (×3): qty 4

## 2015-03-21 MED ORDER — ISOSORBIDE MONONITRATE ER 60 MG PO TB24
90.0000 mg | ORAL_TABLET | Freq: Every day | ORAL | Status: DC
  Administered 2015-03-22 – 2015-03-23 (×2): 90 mg via ORAL
  Filled 2015-03-21 (×3): qty 1

## 2015-03-21 MED FILL — Heparin Sodium (Porcine) 2 Unit/ML in Sodium Chloride 0.9%: INTRAMUSCULAR | Qty: 1000 | Status: AC

## 2015-03-21 MED FILL — Lidocaine HCl Local Preservative Free (PF) Inj 1%: INTRAMUSCULAR | Qty: 30 | Status: AC

## 2015-03-21 NOTE — Progress Notes (Signed)
Patient Name: Steve Durham Date of Encounter: 03/21/2015    Principal Problem:   NSTEMI (non-ST elevated myocardial infarction) Active Problems:   CABG '98, cath Oct 2014- LAD DES placed.   SOB (shortness of breath)   Essential hypertension   Aortic stenosis    Aortic stenosis, mild Oct 2015   Tobacco abuse   Chronic systolic CHF (congestive heart failure)   Hyperlipidemia   Acute renal insufficiency    SUBJECTIVE  No chest pain.  Still dyspneic though overall improved.  Speech is somewhat labored.  CURRENT MEDS . amLODipine  5 mg Oral Daily  . aspirin EC  81 mg Oral QHS  . ezetimibe  10 mg Oral QHS   And  . atorvastatin  20 mg Oral QHS  . Chlorhexidine Gluconate Cloth  6 each Topical Q0600  . clopidogrel  75 mg Oral Daily  . famotidine  10 mg Oral QHS  . ipratropium-albuterol  3 mL Nebulization Q4H  . loratadine  10 mg Oral Daily  . metoprolol  50 mg Oral BID  .  morphine injection  4 mg Intravenous Once  . mupirocin ointment  1 application Nasal BID  . omega-3 acid ethyl esters  1 g Oral Daily  . pantoprazole  40 mg Oral Daily  . sodium chloride  3 mL Intravenous Q12H  . sodium chloride  3 mL Intravenous Q12H    OBJECTIVE  Filed Vitals:   03/21/15 0323 03/21/15 0400 03/21/15 0804 03/21/15 1217  BP:  122/92 113/50   Pulse:  95    Temp:  99.3 F (37.4 C) 98.8 F (37.1 C)   TempSrc:  Oral Oral   Resp:  29 25   Height:      Weight:      SpO2: 97% 94% 98% 99%    Intake/Output Summary (Last 24 hours) at 03/21/15 1236 Last data filed at 03/21/15 0600  Gross per 24 hour  Intake 606.23 ml  Output   1800 ml  Net -1193.77 ml   Filed Weights   03/18/15 2245 03/19/15 0449 03/20/15 0622  Weight: 144 lb 3.2 oz (65.409 kg) 144 lb 11.2 oz (65.635 kg) 149 lb 7.6 oz (67.8 kg)    PHYSICAL EXAM  General: Pleasant, NAD. Neuro: Alert and oriented X 3. Moves all extremities spontaneously. Psych: Flat affect. HEENT:  Normal  Neck: Supple without bruits.  JVP  to jaw. Lungs:  Resp regular and unlabored, bibasilar crackles with diminished breath sounds and scattered rhonchi. Heart: RRR no s3, s4, 3/6 sem @ rusb, 2/6 syst murmur @ apex. Abdomen: Soft, non-tender, non-distended, BS + x 4.  Extremities: No clubbing, cyanosis or edema. DP/PT/Radials 1+ and equal bilaterally. L radial w/o bleeding/bruit/hematoma.  Accessory Clinical Findings  CBC  Recent Labs  03/20/15 0639 03/20/15 1916  WBC 18.6* 14.7*  NEUTROABS  --  12.8*  HGB 12.6* 13.1  HCT 38.4* 39.8  MCV 98.0 97.3  PLT 270 283   Basic Metabolic Panel  Recent Labs  03/20/15 0639 03/20/15 1916  NA 136 136  K 4.3 4.0  CL 101 103  CO2 25 21*  GLUCOSE 115* 109*  BUN 39* 35*  CREATININE 1.78* 1.67*  CALCIUM 8.9 8.7*   Cardiac Enzymes  Recent Labs  03/19/15 0304 03/19/15 0911 03/19/15 1030  TROPONINI 0.17* 0.74* 1.38*   TELE  Rsr, pvc's.  Radiology/Studies  Dg Chest 2 View  03/20/2015   CLINICAL DATA:  Cough, preop for cardiac catheterization  EXAM: CHEST  2  VIEW  COMPARISON:  08/03/2014  FINDINGS: Cardiomediastinal silhouette is stable. Status post CABG. No acute infiltrate or pulmonary edema. Stable chronic mild interstitial prominence bilateral lower lobes. Mild degenerative changes thoracic spine.  IMPRESSION: No active cardiopulmonary disease.  No significant change.   Electronically Signed   By: Lahoma Crocker M.D.   On: 03/20/2015 11:28   [place] 6/2:  -Ost LM to LM lesion, 0% stenosed. Previously placed Ost LM to LM stent (unknown type) is patent. -Ost LAD to Prox LAD lesion, 100% stenosed. -Dist LAD lesion, 0% stenosed. Previously placed Dist LAD drug eluting stent is patent. -Ost Cx to Prox Cx lesion, 10% stenosed. The lesion was previously treated with a stent (unknown type) . -Ost RCA to Prox RCA lesion, 100% stenosed. -RPDA filled by collaterals from Dist LAD.  Severe 3 vessel CAD. Elevated LVEDP of 40 mmHg.   ASSESSMENT AND PLAN  1. NSTEMI/multi-vessel CAD:  Cardiac cath on 6/2 showing diffuse disease with patent LM/prox LCX and patent LIMA LAD and otw known severe prox LAD/RCA dzs and old VG occlusions.  EDP markedly elevated @ 40 mmHg in setting of holding diuretics and gentle hydration precath.  Aggressive secondary prevention recommended. No recurrent chest pain, though still very dyspneic with minimal activity.  Echo ordered to reassess EF. Continue DAPT (ASA + Plavix), Lopressor 50 mg BID, restart home dose Imdur at 90 mg daily.  Cont statin.  2. COPD exacerbation/tobacco abuse: No active wheezing, though COPD certainly contributing to ongoing dyspnea.  CXR w/o acute findings yesterday.  Continue scheduled neb treatments. Recommend IS. Recommend tobacco cessation, discussed with patient who is not interested in quitting.  3. Mild aortic stenosis: echo pending today. Previously with valve area 1.13 cm2 and mean gradient of 14 mmHg on 07/2014 echo. Monitor volume status and exercise tolerance.   4. Acute on chronic combined Systolic/diastolic CHF: EF 22/6333 54%. Continue BB. Home doses of ACEi/Spiro on hold in setting of CKD and need for contrast/cath yesterday.  EDP 40 mmHg on cath.  Wt was up 5 lbs yesterday - not yet weighed today.  I/O + 329 for admission. He did received one dose of IV lasix in cath lab.  Diurese today.  F/U bmet.  Echo pending.  Cont bb. If EF down further, would switch consolidate to toprol XL.  Not clear that he would tolerate coreg in setting of COPD. Resume home dose of nitrate.  Consider hydral if creat remains borderline and BP will tolerate.  5. HTN: Continue Lopressor. Restart Imdur per above.  6. HLD: On zetia @ home w/ LDL of 67 in 02/2014.  Simva dose limited to 20 mg in setting of ongoing amlodipine therapy.  7. Acute on chronic stage III kidney disease: recheck BMP today.   Signed, [name] NP  Patient seen, examined. Available data reviewed. Agree with findings, assessment, and plan as outlined by Steve Bayley, NP. Agree with plan for IV diuresis today. He continues to cough and feel short of breath. LVEDP markedly elevated at cath yesterday. Plan diuresis, follow creatinine in setting of his CKD, and tailor med Rx as tolerated. I agree that he will not tolerate carvedilol. 2D Echo pending.  Sherren Mocha, M.D. 03/21/2015 2:07 PM

## 2015-03-21 NOTE — Progress Notes (Signed)
  Echocardiogram 2D Echocardiogram has been performed.  Steve Durham 03/21/2015, 11:23 AM

## 2015-03-22 DIAGNOSIS — N289 Disorder of kidney and ureter, unspecified: Secondary | ICD-10-CM

## 2015-03-22 DIAGNOSIS — R05 Cough: Secondary | ICD-10-CM | POA: Diagnosis present

## 2015-03-22 DIAGNOSIS — R058 Other specified cough: Secondary | ICD-10-CM | POA: Diagnosis present

## 2015-03-22 DIAGNOSIS — Z720 Tobacco use: Secondary | ICD-10-CM

## 2015-03-22 DIAGNOSIS — I350 Nonrheumatic aortic (valve) stenosis: Secondary | ICD-10-CM

## 2015-03-22 LAB — BASIC METABOLIC PANEL
Anion gap: 9 (ref 5–15)
BUN: 27 mg/dL — ABNORMAL HIGH (ref 6–20)
CO2: 26 mmol/L (ref 22–32)
CREATININE: 1.69 mg/dL — AB (ref 0.61–1.24)
Calcium: 8.5 mg/dL — ABNORMAL LOW (ref 8.9–10.3)
Chloride: 100 mmol/L — ABNORMAL LOW (ref 101–111)
GFR calc Af Amer: 46 mL/min — ABNORMAL LOW (ref >60)
GFR calc non Af Amer: 39 mL/min — ABNORMAL LOW (ref >60)
Glucose, Bld: 120 mg/dL — ABNORMAL HIGH (ref 65–99)
Potassium: 4.1 mmol/L (ref 3.5–5.1)
SODIUM: 135 mmol/L (ref 135–145)

## 2015-03-22 LAB — CBC
HEMATOCRIT: 36.3 % — AB (ref 39.0–52.0)
Hemoglobin: 12.3 g/dL — ABNORMAL LOW (ref 13.0–17.0)
MCH: 32.8 pg (ref 26.0–34.0)
MCHC: 33.9 g/dL (ref 30.0–36.0)
MCV: 96.8 fL (ref 78.0–100.0)
PLATELETS: 433 10*3/uL — AB (ref 150–400)
RBC: 3.75 MIL/uL — ABNORMAL LOW (ref 4.22–5.81)
RDW: 13.4 % (ref 11.5–15.5)
WBC: 20.4 10*3/uL — ABNORMAL HIGH (ref 4.0–10.5)

## 2015-03-22 MED ORDER — PANTOPRAZOLE SODIUM 40 MG PO TBEC
40.0000 mg | DELAYED_RELEASE_TABLET | Freq: Two times a day (BID) | ORAL | Status: DC
  Administered 2015-03-22 – 2015-03-23 (×2): 40 mg via ORAL
  Filled 2015-03-22: qty 1

## 2015-03-22 MED ORDER — METHYLPREDNISOLONE SODIUM SUCC 125 MG IJ SOLR
80.0000 mg | Freq: Three times a day (TID) | INTRAMUSCULAR | Status: DC
  Administered 2015-03-22 – 2015-03-23 (×3): 80 mg via INTRAVENOUS
  Filled 2015-03-22: qty 1.28
  Filled 2015-03-22 (×2): qty 2
  Filled 2015-03-22: qty 1.28
  Filled 2015-03-22: qty 2
  Filled 2015-03-22: qty 1.28

## 2015-03-22 MED ORDER — CEFTRIAXONE SODIUM 1 G IJ SOLR
1.0000 g | INTRAMUSCULAR | Status: DC
  Filled 2015-03-22: qty 10

## 2015-03-22 MED ORDER — FUROSEMIDE 10 MG/ML IJ SOLN
80.0000 mg | Freq: Two times a day (BID) | INTRAMUSCULAR | Status: DC
  Administered 2015-03-22 – 2015-03-23 (×2): 80 mg via INTRAVENOUS
  Filled 2015-03-22 (×3): qty 8

## 2015-03-22 MED ORDER — FAMOTIDINE 20 MG PO TABS
20.0000 mg | ORAL_TABLET | Freq: Every day | ORAL | Status: DC
  Administered 2015-03-22: 20 mg via ORAL
  Filled 2015-03-22 (×2): qty 1

## 2015-03-22 MED ORDER — CEFTRIAXONE SODIUM IN DEXTROSE 20 MG/ML IV SOLN
1.0000 g | INTRAVENOUS | Status: DC
  Administered 2015-03-22: 1 g via INTRAVENOUS
  Filled 2015-03-22 (×2): qty 50

## 2015-03-22 MED ORDER — BISOPROLOL FUMARATE 5 MG PO TABS
2.5000 mg | ORAL_TABLET | Freq: Two times a day (BID) | ORAL | Status: DC
  Administered 2015-03-22 – 2015-03-23 (×3): 2.5 mg via ORAL
  Filled 2015-03-22 (×4): qty 0.5

## 2015-03-22 MED ORDER — DM-GUAIFENESIN ER 30-600 MG PO TB12
2.0000 | ORAL_TABLET | Freq: Two times a day (BID) | ORAL | Status: DC
  Administered 2015-03-22 – 2015-03-23 (×3): 2 via ORAL
  Filled 2015-03-22 (×4): qty 2

## 2015-03-22 NOTE — Progress Notes (Signed)
Subjective:  "My breathing ain't worth a flip". Been worse past week. Month ago poor but not nearly as bad. Increased cough now with   Objective:  Vital Signs in the last 24 hours: Temp:  [98.4 F (36.9 C)-99.4 F (37.4 C)] 98.4 F (36.9 C) (06/04 0722) Pulse Rate:  [78-96] 89 (06/04 0722) Resp:  [20] 20 (06/04 0722) BP: (92-118)/(37-80) 101/51 mmHg (06/04 0722) SpO2:  [91 %-99 %] 94 % (06/04 0811)  Intake/Output from previous day: 06/03 0701 - 06/04 0700 In: 720 [P.O.:720] Out: 1250 [Urine:1250]   Physical Exam: General: Chronic smoker appearance, in no acute distress. Head:  Normocephalic and atraumatic. Lungs: Decreased air movement B, mild wheeze. +cough. Heart: Normal S1 and S2.  2/6 SEM, no rubs or gallops.  Abdomen: soft, non-tender, positive bowel sounds. Extremities: No clubbing or cyanosis. No edema. Neurologic: Alert and oriented x 3.    Lab Results:  Recent Labs  03/20/15 1916 03/22/15 0315  WBC 14.7* 20.4*  HGB 13.1 12.3*  PLT 263 433*    Recent Labs  03/21/15 1418 03/22/15 0315  NA 136 135  K 3.8 4.1  CL 103 100*  CO2 25 26  GLUCOSE 118* 120*  BUN 28* 27*  CREATININE 1.49* 1.69*    Recent Labs  03/19/15 1030  TROPONINI 1.38*    Imaging: Dg Chest 2 View  03/20/2015   CLINICAL DATA:  Cough, preop for cardiac catheterization  EXAM: CHEST  2 VIEW  COMPARISON:  08/03/2014  FINDINGS: Cardiomediastinal silhouette is stable. Status post CABG. No acute infiltrate or pulmonary edema. Stable chronic mild interstitial prominence bilateral lower lobes. Mild degenerative changes thoracic spine.  IMPRESSION: No active cardiopulmonary disease.  No significant change.   Electronically Signed   By: Lahoma Crocker M.D.   On: 03/20/2015 11:28   Personally viewed.   Telemetry: no adverse rhythms Personally viewed.   EKG:  Diffuse ST depression  Cardiac Studies:  Cath 6/2:  Severe three-vessel coronary artery disease.  Patent stent from the left main  into the circumflex system.  Patent LIMA to LAD. Patent stent in the LAD.  Elevated LVEDP of 36 mm Hg. This is likely contributing to his shortness of breath and cough .  IV Lasix given in the Cath Lab due to elevated LVEDP and cough which seemed to be getting worse during the procedure. He'll need his renal function followed closely. Will transfer to a stepdown bed so that he can be monitored more closely. Continue aggressive secondary prevention. Continue dual antiplatelet therapy.  ECHO 03/21/15  - Left ventricle: Severe hypokinesis of base inferoseptal segment and base/mid inferior segments. The cavity size was mildly dilated. Wall thickness was normal. Systolic function was mildly reduced. The estimated ejection fraction was in the range of 45% to 50%. - Aortic valve: There was moderate stenosis. There was mild regurgitation. Mean gradient (S): 18 mm Hg. Peak gradient (S): 33 mm Hg. - Left atrium: The atrium was moderately dilated. - Right ventricle: The cavity size was normal. Systolic function was mildly reduced. - Right atrium: The atrium was mildly dilated.  Scheduled Meds: . amLODipine  5 mg Oral Daily  . aspirin EC  81 mg Oral QHS  . ezetimibe  10 mg Oral QHS   And  . atorvastatin  20 mg Oral QHS  . Chlorhexidine Gluconate Cloth  6 each Topical Q0600  . clopidogrel  75 mg Oral Daily  . famotidine  10 mg Oral QHS  . furosemide  40  mg Intravenous BID  . ipratropium-albuterol  3 mL Nebulization Q4H  . isosorbide mononitrate  90 mg Oral Daily  . loratadine  10 mg Oral Daily  . metoprolol  50 mg Oral BID  .  morphine injection  4 mg Intravenous Once  . mupirocin ointment  1 application Nasal BID  . omega-3 acid ethyl esters  1 g Oral Daily  . pantoprazole  40 mg Oral Daily  . sodium chloride  3 mL Intravenous Q12H  . sodium chloride  3 mL Intravenous Q12H   Continuous Infusions:  PRN Meds:.sodium chloride, sodium chloride, acetaminophen,  chlorpheniramine-HYDROcodone, ondansetron (ZOFRAN) IV, sodium chloride, sodium chloride   Assessment/Plan:  Principal Problem:   NSTEMI (non-ST elevated myocardial infarction) Active Problems:   Essential hypertension   CABG '98, cath Oct 2014- LAD DES placed.   Aortic stenosis    Hyperlipidemia   Aortic stenosis, mild Oct 2015   Tobacco abuse   Chronic systolic CHF (congestive heart failure)   SOB (shortness of breath)   Acute renal insufficiency   Acute on chronic systolic heart failure, NYHA class 4  NSTEMI  - 3v CAD, no PCI on cath  - Med mgt. ASA, Plavix, Statin, bb.   - No ACE-I with CKD  - EF 45%  COPD  - likely concomitant exacerbation  - pulmonary consultation (Dr. Melvyn Novas seeing)  - appreciate recs  Acute systolic/ diastolic HF  - LVEDP was 60FUXN on cath  - Giving lasix 40 BID IV. Total out yesterday 1.2L   - Will increase lasix to 80 BID IV, meager output  - Watch creat.  Aortic stenosis  - mild to moderate  CAD  - post CABG  Leukocytosis  - 20. ? COPD exac  Tobacco use  - encouraged cessation  Hyperlipidemia  -statin (on zetia at home)   Marlou Porch, [place] 03/22/2015, 9:49 AM

## 2015-03-22 NOTE — Consult Note (Signed)
PULMONARY / CRITICAL CARE MEDICINE   Name: Steve Durham MRN: [id number] DOB: Nov 17, 1945    ADMISSION DATE:  03/18/2015 CONSULTATION DATE:  03/22/15   REFERRING MD : Candee Furbish   CHIEF COMPLAINT:  sob  INITIAL PRESENTATION:  83 yowm active smoker with h/o bronchitis but only intermittent and not on any resp rx chronically with progressive doe x one month and one week pta acutely worse sob at rest with cough/ sore throat/ hoarseness while on ACEi admitted with chf by cards and ACEi d/c'd 6/1 but still sob at rest and hacking cough   with discolored mucus so PCCM service consulted am 6/4  STUDIES:   Heart Cath Cardiac Studies: Cath 6/2:  Severe three-vessel coronary artery disease.  Patent stent from the left main into the circumflex system.  Patent LIMA to LAD. Patent stent in the LAD. Elevated LVEDP of 36 mm Hg  SIGNIFICANT EVENTS: D/c acei 03/19/15   HISTORY OF PRESENT ILLNESS:    69 y.o. male with a hx of CAD s/p prior CABG 1993 and inf MI in 02/2014 with LM stenting (turned down for re do CABG in 2013, both SVGs known to be occluded) with LM POBA and proximal OM1 PCI in 89/3734, ICM, systolic CHF (EF 28% 76/8115), mild-moderate aortic stenosis, HTN, HL who presents with SOB and ECG changes.   Steve Durham reports that he has had worsening of his baseline dyspnea since this 5/29. He notes associated cough with dark white/yellow sputum. Denies fevers, chills, or sweats. Wheezing was at baseline. No sick contacts. Dyspnea worse with exertion and lying flat, both of which are longstanding. He has chronic PND and 2+ pillow orthopnea. Although he endorses intermittent light chest pressure, be believes it is related to a respiratory infection and not ischemic pain. He states his prior MI pain was "pain across the shoulders and to the back" and that the current intermittent pressure is very difference.   Due to progressive dyspnea, Steve Durham was evaluated at his PCP office and sent to the  [facility] ER. On arrival to the ER, he was chest pain free and hemodynamically stable. Labs were notable for Cr 1.7, TnI 0.01 -->0.03, WBC 20.3. ECG demonstrated lateral STD which were new compared to priors. He was given 324mg  PO aspirin, duonebs, 125mg  solumedrol, and 0.5 inches of nitropaste. CXR demonstrated "no acute abnormalities" per report. Dr. Angelena Form reviewed the ECG and requested transfer to [facility].   No obvious patterns in day to day or daytime variabilty or assoc  subjective wheeze overt sinus or hb symptoms. No unusual exp hx or h/o childhood pna/ asthma or knowledge of premature birth.   Also denies any obvious fluctuation of symptoms with weather or environmental changes or other aggravating or alleviating factors except as outlined above   Current Medications, Allergies, Complete Past Medical History, Past Surgical History, Family History, and Social History were reviewed in [employer] record.  ROS  The following are not active complaints unless bolded sore throat, dysphagia, dental problems, itching, sneezing,  nasal congestion or excess/ purulent secretions, ear ache,   fever, chills, sweats, unintended wt loss, pleuritic or exertional cp, hemoptysis,  orthopnea pnd or leg swelling, presyncope, palpitations, heartburn, abdominal pain, anorexia, nausea, vomiting, diarrhea  or change in bowel or urinary habits, change in stools or urine, dysuria,hematuria,  rash, arthralgias, visual complaints, headache, numbness weakness or ataxia or problems with walking or coordination,  change in mood/affect or memory.       PAST  MEDICAL HISTORY :   has a past medical history of Hypertension; Hyperlipoproteinemia; Aortic stenosis; Back pain; Carotid artery occlusion; Hyperlipidemia; Abnormal CT scan, kidney; Cholelithiasis; Coronary artery disease; Ischemic cardiomyopathy; and Heart failure (systolic).  has past surgical history that includes Cardiac catheterization (04/03/99);  Carpal tunnel release (2007); Hernia repair (1988); Coronary artery bypass graft (1993); Fasciotomy (Right, 07/30/2013); left heart catheterization with coronary/graft angiogram (N/A, 08/10/2012); percutaneous coronary stent intervention (pci-s) (07/31/2013); left heart catheterization with coronary angiogram (N/A, 02/28/2014); left heart catheterization with coronary/graft angiogram (N/A, 08/01/2014); left heart catheterization with coronary/graft angiogram (N/A, 02/12/2015); and Cardiac catheterization (N/A, 03/20/2015). Prior to Admission medications   Medication Sig Start Date End Date Taking? Authorizing Provider  clopidogrel (PLAVIX) 75 MG tablet TAKE 1 TABLET (75 MG TOTAL) BY MOUTH DAILY WITH BREAKFAST. 08/28/14  Yes Burnell Blanks, MD  famotidine (PEPCID) 10 MG tablet Take 10 mg by mouth at bedtime.   Yes Historical Provider, MD  pantoprazole (PROTONIX) 40 MG tablet Take 1 tablet (40 mg total) by mouth daily. 03/10/15  Yes Scott T Kathlen Mody, PA-C  potassium chloride SA (K-DUR,KLOR-CON) 20 MEQ tablet Take 1 tablet (20 mEq total) by mouth daily. 08/07/14  Yes Brittainy M Simmons, PA-C  amLODipine-benazepril (LOTREL) 5-20 MG per capsule TAKE 2 CAPSULES BY MOUTH EVERY DAY 09/23/14   Burnell Blanks, MD  aspirin EC 81 MG tablet Take 81 mg by mouth at bedtime.    Historical Provider, MD  ezetimibe-simvastatin (VYTORIN) 10-40 MG per tablet Take 1 tablet by mouth at bedtime.     Historical Provider, MD  fexofenadine (ALLEGRA) 180 MG tablet Take 180 mg by mouth daily.     Historical Provider, MD  furosemide (LASIX) 40 MG tablet Take 1 tablet (40 mg total) by mouth daily. 01/23/15   Burnell Blanks, MD  isosorbide mononitrate (IMDUR) 60 MG 24 hr tablet TAKE 1 AND 1/2 TABLET (90 MG TOTAL) BY MOUTH DAILY. 02/11/15   Burnell Blanks, MD  metoprolol (LOPRESSOR) 50 MG tablet Take 50 mg by mouth 2 (two) times daily.    Historical Provider, MD  nitroGLYCERIN (NITROSTAT) 0.4 MG SL tablet TAKE 1  TABLET UNDER THE TONGUE AS NEEDEDFOR CHEST PAIN ( MAY REPEAT EVERY 5 MINUTES X 3) 01/23/15   Burnell Blanks, MD  Omega-3 Fatty Acids (FISH OIL) 1000 MG CAPS Take 1,000 mg by mouth 2 (two) times daily.      Historical Provider, MD  spironolactone (ALDACTONE) 25 MG tablet TAKE 0.5 TABLETS (12.5 MG TOTAL) BY MOUTH 2 (TWO) TIMES DAILY. 03/19/15   Burnell Blanks, MD   No Known Allergies  FAMILY HISTORY:  indicated that his mother is deceased. He indicated that his father is deceased.  SOCIAL HISTORY:  reports that he has been smoking Cigarettes.  He has a 50 pack-year smoking history. He does not have any smokeless tobacco history on file. He reports that he drinks about 3.0 oz of alcohol per week. He reports that he does not use illicit drugs.     SUBJECTIVE:  As per pt profile/ mild increase air hunger but sats fine on RA   VITAL SIGNS: Temp:  [98.4 F (36.9 C)-99.4 F (37.4 C)] 98.4 F (36.9 C) (06/04 0722) Pulse Rate:  [78-96] 89 (06/04 0722) Resp:  [20] 20 (06/04 0722) BP: (92-118)/(37-80) 101/51 mmHg (06/04 0722) SpO2:  [91 %-99 %] 94 % (06/04 0811) HEMODYNAMICS:   VENTILATOR SETTINGS:   INTAKE / OUTPUT:  Intake/Output Summary (Last 24 hours) at 03/22/15 1022 Last  data filed at 03/22/15 0000  Gross per 24 hour  Intake    480 ml  Output   1250 ml  Net   -770 ml    PHYSICAL EXAMINATION: General: extremely hoarse wm sitting up on side of bed mild increased wob at rest Pt alert, approp n  No jvd Oropharynx min erythema Neck supple Lungs with a few scattered exp > insp rhonchi bilaterally/ prominent pseudowheezing  RRR no s3 or  II-III/ VI sem best at LSB Abd nl  excursion >> neg hoover's Extr wam with no edema or clubbing noted Neuro  No motor deficits   LABS:  CBC  Recent Labs Lab 03/20/15 0639 03/20/15 1916 03/22/15 0315  WBC 18.6* 14.7* 20.4*  HGB 12.6* 13.1 12.3*  HCT 38.4* 39.8 36.3*  PLT 270 263 433*   Coag's  Recent Labs Lab  03/20/15 0639  INR 1.08   BMET  Recent Labs Lab 03/20/15 1916 03/21/15 1418 03/22/15 0315  NA 136 136 135  K 4.0 3.8 4.1  CL 103 103 100*  CO2 21* 25 26  BUN 35* 28* 27*  CREATININE 1.67* 1.49* 1.69*  GLUCOSE 109* 118* 120*   Electrolytes  Recent Labs Lab 03/20/15 1916 03/21/15 1418 03/22/15 0315  CALCIUM 8.7* 8.7* 8.5*    CXR 03/20/15 Cardiomediastinal silhouette is stable. Status post CABG. No acute infiltrate or pulmonary edema. Stable chronic mild interstitial prominence bilateral lower lobes. Mild degenerative changes thoracic spine  ASSESSMENT / PLAN:   Imp: Severe cough/ upper airway obst s/p acei d/c'd 6/1 in an active smoker with little to suggest significant copd here.   Symptoms are markedly disproportionate to objective findings and not clear this is a lung problem but pt does appear to have difficult airway management issues.    DDX of  difficult airways management all start with A and  include Adherence, Ace Inhibitors, Acid Reflux, Active Sinus Disease, Alpha 1 Antitripsin deficiency, Anxiety masquerading as Airways dz,  ABPA,  allergy(esp in young), Aspiration (esp in elderly), Adverse effects of DPI,  Active smokers, plus two Bs  = Bronchiectasis and Beta blocker use..and one C= CHF   Active smoking a big factor as outpt  but worse since admit so not such an issue here   ? acei effects > stopped 6/1 but still clearly on board and takes up to 6 weeks to see full benefit of stopping  ? Acid (or non-acid) GERD > always difficult to exclude as up to 75% of pts in some series report no assoc GI/ Heartburn symptoms> rec max (24h)  acid suppression and diet restrictions/ reviewed and instructions given in writing.    ? Allergy / asthmatic component  > start steroids but rapid taper off rapidly once turns the corner.  ? Active sinus Dz > rx abx should cover  ? CHF > strongly doubt this is causing his symptoms at present and suspect he's rather well  compensated for chronic elevation of Left atrial pressures    Will follow with you - call if questions   Christinia Gully, MD Pulmonary and [place] [phone] After 5:30 PM or weekends, call [phone]

## 2015-03-23 DIAGNOSIS — R05 Cough: Secondary | ICD-10-CM

## 2015-03-23 DIAGNOSIS — I25700 Atherosclerosis of coronary artery bypass graft(s), unspecified, with unstable angina pectoris: Secondary | ICD-10-CM

## 2015-03-23 LAB — CBC
HCT: 35.8 % — ABNORMAL LOW (ref 39.0–52.0)
Hemoglobin: 12 g/dL — ABNORMAL LOW (ref 13.0–17.0)
MCH: 32 pg (ref 26.0–34.0)
MCHC: 33.5 g/dL (ref 30.0–36.0)
MCV: 95.5 fL (ref 78.0–100.0)
PLATELETS: 278 10*3/uL (ref 150–400)
RBC: 3.75 MIL/uL — AB (ref 4.22–5.81)
RDW: 13.2 % (ref 11.5–15.5)
WBC: 15.7 10*3/uL — ABNORMAL HIGH (ref 4.0–10.5)

## 2015-03-23 LAB — BASIC METABOLIC PANEL
ANION GAP: 12 (ref 5–15)
BUN: 36 mg/dL — ABNORMAL HIGH (ref 6–20)
CO2: 24 mmol/L (ref 22–32)
Calcium: 8.6 mg/dL — ABNORMAL LOW (ref 8.9–10.3)
Chloride: 99 mmol/L — ABNORMAL LOW (ref 101–111)
Creatinine, Ser: 1.68 mg/dL — ABNORMAL HIGH (ref 0.61–1.24)
GFR, EST AFRICAN AMERICAN: 46 mL/min — AB (ref >60)
GFR, EST NON AFRICAN AMERICAN: 40 mL/min — AB (ref >60)
Glucose, Bld: 197 mg/dL — ABNORMAL HIGH (ref 65–99)
POTASSIUM: 3.8 mmol/L (ref 3.5–5.1)
Sodium: 135 mmol/L (ref 135–145)

## 2015-03-23 MED ORDER — METHYLPREDNISOLONE 4 MG PO TABS: 4.0000 mg | ORAL_TABLET | Freq: Every day | ORAL | Status: DC

## 2015-03-23 MED ORDER — AMLODIPINE BESYLATE 5 MG PO TABS: 5.0000 mg | ORAL_TABLET | Freq: Every day | ORAL | Status: DC

## 2015-03-23 MED ORDER — BISOPROLOL FUMARATE 5 MG PO TABS: 2.5000 mg | ORAL_TABLET | Freq: Two times a day (BID) | ORAL | Status: DC

## 2015-03-23 MED ORDER — DM-GUAIFENESIN ER 30-600 MG PO TB12: 2.0000 | ORAL_TABLET | Freq: Two times a day (BID) | ORAL | Status: DC

## 2015-03-23 NOTE — Discharge Summary (Signed)
Physician Discharge Summary    Cardiologist:  [place]  Patient ID: Steve Durham MRN: [id number] DOB/AGE: 71-30-1947 71 y.o.  Admit date: 03/18/2015 Discharge date: 03/23/2015  Admission Diagnoses:  NSTEMI  Discharge Diagnoses:  Principal Problem:   NSTEMI (non-ST elevated myocardial infarction) Active Problems:   Essential hypertension   CABG '98, cath Oct 2014- LAD DES placed.   Aortic stenosis    Hyperlipidemia   Aortic stenosis, mild Oct 2015   Tobacco abuse   Chronic systolic CHF (congestive heart failure)   SOB (shortness of breath)   Acute renal insufficiency   Acute on chronic systolic heart failure, NYHA class 4   Upper airway cough syndrome   Discharged Condition: stable  Hospital Course:   69 y.o. male with a hx of CAD s/p prior CABG 1993 and inf MI in 02/2014 with LM stenting (turned down for re do CABG in 2013, both SVGs known to be occluded) with LM POBA and proximal OM1 PCI in 31/5400, ICM, systolic CHF (EF 86% 76/1950), mild-moderate aortic stenosis, HTN, HL who presents with SOB and ECG changes.   Steve Durham reports that he has had worsening of his baseline dyspnea since this past Sunday. He notes associated cough with dark white/yellow sputum. Denies fevers, chills, or sweats. Wheezing was at baseline. No sick contacts. Dyspnea worse with exertion and lying flat, both of which are longstanding. He has chronic PND and 2+ pillow orthopnea. Although he endorses intermittent light chest pressure, be believes it is related to a respiratory infection and not ischemic pain. He states his prior MI pain was "pain across the shoulders and to the back" and that the current intermittent pressure is very difference.   Due to progressive dyspnea, [name] was evaluated at his PCP office and sent to the [facility] ER. On arrival to the ER, he was chest pain free and hemodynamically stable. Labs were notable for Cr 1.7, TnI 0.01 -->0.03, WBC 20.3. ECG demonstrated lateral STD  which were new compared to priors. He was given 324mg  PO aspirin, duonebs, 125mg  solumedrol, and 0.5 inches of nitropaste. CXR demonstrated "no acute abnormalities" per report. Dr. Angelena Form reviewed the ECG and requested transfer to [facility].   Of note, Steve Durham last LHC was on 02/12/15 and it demonstrated: LM: stent patent with mild diffuse 20% restenosis.  LAD: Ostial 100%  LCx: 30% restenosis in the stented segment which extends from ostium into OM1. AVCFX occluded and fills L-L collats RCA: Known to be occluded at the ostium. (not engaged). The distal vessel fills L-R collats  SVG to PDA is known to be occluded  SVG to OM is known to be occluded  LIMA to LAD is patent. The stent in the mid LAD beyond the graft insertion is patent with minimal restenosis.  LVEF=35-40%. Global hypokinesis with more severe hypokinesis in the inferior wall.    He was started on levaquin for possible respiratory infection.  He was continued on aspirin, Plavix, metoprolol, Vytorin, Imdur and fish oils. Troponin was cycled and the last one was 1.38.  On initial presentation he appeared euvolemic. Lasix was held.  Patient refused to wear SCDs.  She had additional chest pain while in the 21st arrived this was treated with 3 sublingual nitroglycerin without complete relief. He was then given morphine IV Lopressor and IV Ativan. Levaquin was discontinued since the patient claimed chest pain started 10 minutes after it was infusing was in noted few reddened rashes over his body. Nitroglycerin drip was started. ACE  inhibitor and spironolactone were also held due to elevated creatinine. He was gently hydrated. Later the patient became hypotensive nitroglycerin rate was decreased and was continued on IV fluids..  He underwent a left heart catheterization on June 2 which revealed severe three-vessel coronary artery disease. Patent stent from the left main into the circumflex system.  Patent LIMA to LAD.  Patent stent in the  LAD.  Elevated LVEDP of 36 mm Hg. This is likely contributing to his shortness of breath and cough.  Patient was given IV Lasix in the cath lab due to elevated LVEDP. Plan for continued dual antiplatelets therapy. 2-D echocardiogram revealed an ejection fraction of 45-50%. Severe hypokinesis of the basal inferior septal segment and basal mid inferior segments. There is moderate aortic stenosis.  After his cath he was continued on IV Lasix at 80 mg twice daily.  Pulmonology was consulted.  He was changed to 40 mg of Lasix by mouth daily for discharge. We'll need to repeat basic metabolic panel this coming week.  He is scheduled to see his primary care provider this week and we will see him in follow-up within 2 weeks. Dr. Marlou Porch thought optimally he should stay another day but the patient was insisting on going home today.  He is currently stable    Consults: Pulmonology  Significant Diagnostic Studies:  Echocardiogram,  Study Conclusions  - Left ventricle: Severe hypokinesis of base inferoseptal segment and base/mid inferior segments. The cavity size was mildly dilated. Wall thickness was normal. Systolic function was mildly reduced. The estimated ejection fraction was in the range of 45% to 50%. - Aortic valve: There was moderate stenosis. There was mild regurgitation. Mean gradient (S): 18 mm Hg. Peak gradient (S): 33 mm Hg. - Left atrium: The atrium was moderately dilated. - Right ventricle: The cavity size was normal. Systolic function was mildly reduced. - Right atrium: The atrium was mildly dilated.   Treatments: See above  Discharge Exam: Blood pressure 110/52, pulse 93, temperature 98.9 F (37.2 C), temperature source Oral, resp. rate 18, height 5\' 5"  (1.651 m), weight 149 lb 7.6 oz (67.8 kg), SpO2 95 %.   Disposition: 01-Home or Self Care      Discharge Instructions    Diet - low sodium heart healthy    Complete by:  As directed      Discharge instructions     Complete by:  As directed   Monitor your weight every morning.  If you gain 3 pounds in 24 hours, or 5 pounds in a week, call the office for instructions.     Increase activity slowly    Complete by:  As directed             Medication List    STOP taking these medications        amLODipine-benazepril 5-20 MG per capsule  Commonly known as:  LOTREL  Replaced by:  amLODipine 5 MG tablet     metoprolol 50 MG tablet  Commonly known as:  LOPRESSOR     spironolactone 25 MG tablet  Commonly known as:  ALDACTONE      TAKE these medications        amLODipine 5 MG tablet  Commonly known as:  NORVASC  Take 1 tablet (5 mg total) by mouth daily.     aspirin EC 81 MG tablet  Take 81 mg by mouth at bedtime.     bisoprolol 5 MG tablet  Commonly known as:  ZEBETA  Take 0.5 tablets (  2.5 mg total) by mouth 2 (two) times daily.     clopidogrel 75 MG tablet  Commonly known as:  PLAVIX  TAKE 1 TABLET (75 MG TOTAL) BY MOUTH DAILY WITH BREAKFAST.     dextromethorphan-guaiFENesin 30-600 MG per 12 hr tablet  Commonly known as:  MUCINEX DM  Take 2 tablets by mouth 2 (two) times daily.     ezetimibe-simvastatin 10-40 MG per tablet  Commonly known as:  VYTORIN  Take 1 tablet by mouth at bedtime.     famotidine 10 MG tablet  Commonly known as:  PEPCID  Take 10 mg by mouth at bedtime.     fexofenadine 180 MG tablet  Commonly known as:  ALLEGRA  Take 180 mg by mouth daily.     Fish Oil 1000 MG Caps  Take 1,000 mg by mouth 2 (two) times daily.     furosemide 40 MG tablet  Commonly known as:  LASIX  Take 1 tablet (40 mg total) by mouth daily.     isosorbide mononitrate 60 MG 24 hr tablet  Commonly known as:  IMDUR  TAKE 1 AND 1/2 TABLET (90 MG TOTAL) BY MOUTH DAILY.     methylPREDNISolone 4 MG tablet  Commonly known as:  MEDROL  Take 1 tablet (4 mg total) by mouth daily.     nitroGLYCERIN 0.4 MG SL tablet  Commonly known as:  NITROSTAT  TAKE 1 TABLET UNDER THE TONGUE AS  NEEDEDFOR CHEST PAIN ( MAY REPEAT EVERY 5 MINUTES X 3)     pantoprazole 40 MG tablet  Commonly known as:  PROTONIX  Take 1 tablet (40 mg total) by mouth daily.     potassium chloride SA 20 MEQ tablet  Commonly known as:  K-DUR,KLOR-CON  Take 1 tablet (20 mEq total) by mouth daily.       Follow-up Information    Follow up with Lauree Chandler, MD.   Specialty:  Cardiology   Why:  The office scheduler will call you with your follow up appointment date and time   Contact information:   [place] [place] [address] [phone]      Greater than 30 minutes was spent completing the patient's discharge.    SignedTarri Fuller, [place] 03/23/2015, 11:42 AM

## 2015-03-23 NOTE — Consult Note (Signed)
Name:Steve Durham JZP:[id number] DOB:1946-03-26   ADMISSION DATE: 03/18/2015 CONSULTATION DATE: 03/22/15   REFERRING MD : Candee Furbish  CHIEF COMPLAINT: sob  INITIAL PRESENTATION: 86 yowm active smoker with h/o bronchitis but only intermittent and not on any resp rx chronically with progressive doe x one month and one week pta acutely worse sob at rest with cough/ sore throat/ hoarseness while on ACEi admitted with chf by cards and ACEi d/c'd 6/1 but still sob at rest and hacking cough with discolored mucus so PCCM service consulted am 6/4  STUDIES:  Heart Cath Cardiac Studies: Cath 6/2:  Severe three-vessel coronary artery disease.  Patent stent from the left main into the circumflex system.  Patent LIMA to LAD. Patent stent in the LAD. Elevated LVEDP of 36 mm Hg  SIGNIFICANT EVENTS: D/c acei 03/19/15     SUBJECTIVE:  Still quite hoarse but good sats, no increased wob   VITAL SIGNS: Temp: [97.7 F (36.5 C)-99.3 F (37.4 C)] 98.3 F (36.8 C) (06/05 1107) Pulse Rate: [78-93] 90 (06/05 1107) Resp: [18-20] 18 (06/05 1107) BP: (82-114)/(44-60) 113/60 mmHg (06/05 1107) SpO2: [93 %-99 %] 99 % (06/05 1145) HEMODYNAMICS:   VENTILATOR SETTINGS:   INTAKE / OUTPUT:  Intake/Output Summary (Last 24 hours) at 03/23/15 1344 Last data filed at 03/23/15 0900  Gross per 24 hour  Intake  720 ml  Output  0 ml  Net  720 ml    PHYSICAL EXAMINATION: General: extremely hoarse Otherwise nad  Pt alert, approp  No jvd Oropharynx min erythema Neck supple Lungs with a distant bs/ no wheeze RRR no s3 or II-III/ VI sem best at LSB Abd nl excursion >> neg hoover's Extr wam with no edema or clubbing noted   LABS:  CBC  Recent Labs Lab 03/20/15 0639 03/20/15 1916 03/22/15 0315  WBC 18.6* 14.7* 20.4*  HGB 12.6* 13.1 12.3*  HCT 38.4* 39.8 36.3*  PLT 270 263 433*   Coag's  Recent Labs Lab  03/20/15 0639  INR 1.08   BMET  Recent Labs Lab 03/20/15 1916 03/21/15 1418 03/22/15 0315  NA 136 136 135  K 4.0 3.8 4.1  CL 103 103 100*  CO2 21* 25 26  BUN 35* 28* 27*  CREATININE 1.67* 1.49* 1.69*  GLUCOSE 109* 118* 120*   Electrolytes  Recent Labs Lab 03/20/15 1916 03/21/15 1418 03/22/15 0315  CALCIUM 8.7* 8.7* 8.5*    CXR 03/20/15 Cardiomediastinal silhouette is stable. Status post CABG. No acute infiltrate or pulmonary edema. Stable chronic mild interstitial prominence bilateral lower lobes. Mild degenerative changes thoracic spine  ASSESSMENT / PLAN:   Imp: Severe cough/ upper airway obst s/p acei d/c'd 6/1 in an active smoker with little to suggest significant copd here.   ? Active sinus Dz > rx abx should cover> do sinus study as outpt if needed   ? CHF > strongly doubt this is causing his symptoms at present and suspect he's rather well compensated for chronic elevation of Left atrial pressures   Offered to see as outpt as not happy with w/u in [place] by pulmonary there in past.  rec he use hard rock candy or ice chips to keep from excessive throat clearing. No mint or menthol products     Christinia Gully, MD Pulmonary and [place] [phone] After 5:30 PM or weekends, call [phone]

## 2015-03-23 NOTE — Progress Notes (Signed)
Discharge instructions given to pt via teach back method. Pt verbalized understanding. Denies any questions or concerns. Pt discharged home with wife.

## 2015-03-23 NOTE — Consult Note (Deleted)
PULMONARY / CRITICAL CARE MEDICINE   Name: Steve Durham MRN: [id number] DOB: [birth year]    ADMISSION DATE:  03/18/2015 CONSULTATION DATE:  03/22/15   REFERRING MD : Candee Furbish   CHIEF COMPLAINT:  sob  INITIAL PRESENTATION:  63 yowm active smoker with h/o bronchitis but only intermittent and not on any resp rx chronically with progressive doe x one month and one week pta acutely worse sob at rest with cough/ sore throat/ hoarseness while on ACEi admitted with chf by cards and ACEi d/c'd 6/1 but still sob at rest and hacking cough   with discolored mucus so PCCM service consulted am 6/4  STUDIES:   Heart Cath Cardiac Studies: Cath 6/2:  Severe three-vessel coronary artery disease.  Patent stent from the left main into the circumflex system.  Patent LIMA to LAD. Patent stent in the LAD. Elevated LVEDP of 36 mm Hg  SIGNIFICANT EVENTS: D/c acei 03/19/15       SUBJECTIVE:  Still quite hoarse but good sats, no increased wob   VITAL SIGNS: Temp:  [97.7 F (36.5 C)-99.3 F (37.4 C)] 98.3 F (36.8 C) (06/05 1107) Pulse Rate:  [78-93] 90 (06/05 1107) Resp:  [18-20] 18 (06/05 1107) BP: (82-114)/(44-60) 113/60 mmHg (06/05 1107) SpO2:  [93 %-99 %] 99 % (06/05 1145) HEMODYNAMICS:   VENTILATOR SETTINGS:   INTAKE / OUTPUT:  Intake/Output Summary (Last 24 hours) at 03/23/15 1344 Last data filed at 03/23/15 0900  Gross per 24 hour  Intake    720 ml  Output      0 ml  Net    720 ml    PHYSICAL EXAMINATION: General: extremely hoarse  Otherwise nad  Pt alert, approp   No jvd Oropharynx min erythema Neck supple Lungs with a distant bs/ no wheeze  RRR no s3 or  II-III/ VI sem best at LSB Abd nl  excursion >> neg hoover's Extr wam with no edema or clubbing noted    LABS:  CBC  Recent Labs Lab 03/20/15 0639 03/20/15 1916 03/22/15 0315  WBC 18.6* 14.7* 20.4*  HGB 12.6* 13.1 12.3*  HCT 38.4* 39.8 36.3*  PLT 270 263 433*   Coag's  Recent Labs Lab 03/20/15 0639   INR 1.08   BMET  Recent Labs Lab 03/20/15 1916 03/21/15 1418 03/22/15 0315  NA 136 136 135  K 4.0 3.8 4.1  CL 103 103 100*  CO2 21* 25 26  BUN 35* 28* 27*  CREATININE 1.67* 1.49* 1.69*  GLUCOSE 109* 118* 120*   Electrolytes  Recent Labs Lab 03/20/15 1916 03/21/15 1418 03/22/15 0315  CALCIUM 8.7* 8.7* 8.5*    CXR 03/20/15 Cardiomediastinal silhouette is stable. Status post CABG. No acute infiltrate or pulmonary edema. Stable chronic mild interstitial prominence bilateral lower lobes. Mild degenerative changes thoracic spine  ASSESSMENT / PLAN:   Imp: Severe cough/ upper airway obst s/p acei d/c'd 6/1 in an active smoker with little to suggest significant copd here.    ? Active sinus Dz > rx abx should cover> do sinus study as outpt if needed   ? CHF > strongly doubt this is causing his symptoms at present and suspect he's rather well compensated for chronic elevation of Left atrial pressures   Offered to see as outpt as not happy with w/u in [place] by pulmonary there in past.  rec he use hard rock candy or ice chips to keep from excessive throat clearing. No mint or menthol products      Legrand Como  Melvyn Novas, MD Pulmonary and [place] [phone] After 5:30 PM or weekends, call [phone]

## 2015-03-23 NOTE — Progress Notes (Signed)
Subjective:  "My breathing's been bad for 40 years". Been worse past week. Month ago poor but not nearly as bad. Cough mildly improved since yesterday.   Objective:  Vital Signs in the last 24 hours: Temp:  [97.7 F (36.5 C)-99.3 F (37.4 C)] 98.9 F (37.2 C) (06/05 0842) Pulse Rate:  [69-93] 93 (06/05 0842) Resp:  [18-20] 18 (06/05 0842) BP: (82-114)/(44-60) 110/52 mmHg (06/05 0842) SpO2:  [93 %-99 %] 95 % (06/05 0842)  Intake/Output from previous day: 06/04 0701 - 06/05 0700 In: 960 [P.O.:960] Out: 400 [Urine:400]   Physical Exam: General: Chronic smoker appearance, in no acute distress. Head:  Normocephalic and atraumatic. Lungs: mildly increased RR, no wheeze. +cough. Heart: Normal S1 and S2.  2/6 SEM, no rubs or gallops.  Abdomen: soft, non-tender, positive bowel sounds. Extremities: No clubbing or cyanosis. No edema. Neurologic: Alert and oriented x 3.    Lab Results:  Recent Labs  03/22/15 0315 03/23/15 0330  WBC 20.4* 15.7*  HGB 12.3* 12.0*  PLT 433* 278    Recent Labs  03/22/15 0315 03/23/15 0330  NA 135 135  K 4.1 3.8  CL 100* 99*  CO2 26 24  GLUCOSE 120* 197*  BUN 27* 36*  CREATININE 1.69* 1.68*   Telemetry: no adverse rhythms Personally viewed.   EKG:  Diffuse ST depression  Cardiac Studies:  Cath 6/2:  Severe three-vessel coronary artery disease.  Patent stent from the left main into the circumflex system.  Patent LIMA to LAD. Patent stent in the LAD.  Elevated LVEDP of 36 mm Hg. This is likely contributing to his shortness of breath and cough .  IV Lasix given in the Cath Lab due to elevated LVEDP and cough which seemed to be getting worse during the procedure. He'll need his renal function followed closely. Will transfer to a stepdown bed so that he can be monitored more closely. Continue aggressive secondary prevention. Continue dual antiplatelet therapy.  ECHO 03/21/15  - Left ventricle: Severe hypokinesis of base  inferoseptal segment and base/mid inferior segments. The cavity size was mildly dilated. Wall thickness was normal. Systolic function was mildly reduced. The estimated ejection fraction was in the range of 45% to 50%. - Aortic valve: There was moderate stenosis. There was mild regurgitation. Mean gradient (S): 18 mm Hg. Peak gradient (S): 33 mm Hg. - Left atrium: The atrium was moderately dilated. - Right ventricle: The cavity size was normal. Systolic function was mildly reduced. - Right atrium: The atrium was mildly dilated.  Scheduled Meds: . amLODipine  5 mg Oral Daily  . aspirin EC  81 mg Oral QHS  . ezetimibe  10 mg Oral QHS   And  . atorvastatin  20 mg Oral QHS  . bisoprolol  2.5 mg Oral BID  . cefTRIAXone (ROCEPHIN)  IV  1 g Intravenous Q24H  . Chlorhexidine Gluconate Cloth  6 each Topical Q0600  . clopidogrel  75 mg Oral Daily  . dextromethorphan-guaiFENesin  2 tablet Oral BID  . famotidine  20 mg Oral QHS  . furosemide  80 mg Intravenous BID  . ipratropium-albuterol  3 mL Nebulization Q4H  . isosorbide mononitrate  90 mg Oral Daily  . loratadine  10 mg Oral Daily  . methylPREDNISolone (SOLU-MEDROL) injection  80 mg Intravenous 3 times per day  .  morphine injection  4 mg Intravenous Once  . mupirocin ointment  1 application Nasal BID  . pantoprazole  40 mg Oral BID AC  .  sodium chloride  3 mL Intravenous Q12H  . sodium chloride  3 mL Intravenous Q12H   Continuous Infusions:  PRN Meds:.sodium chloride, sodium chloride, acetaminophen, chlorpheniramine-HYDROcodone, ondansetron (ZOFRAN) IV, sodium chloride, sodium chloride   Assessment/Plan:  Principal Problem:   NSTEMI (non-ST elevated myocardial infarction) Active Problems:   Essential hypertension   CABG '98, cath Oct 2014- LAD DES placed.   Aortic stenosis    Hyperlipidemia   Aortic stenosis, mild Oct 2015   Tobacco abuse   Chronic systolic CHF (congestive heart failure)   SOB (shortness of  breath)   Acute renal insufficiency   Acute on chronic systolic heart failure, NYHA class 4   Upper airway cough syndrome  NSTEMI  - 3v CAD, no PCI on cath  - Med mgt. ASA, Plavix, Statin, bb.   - No ACE-I with CKD  - EF 45%  COPD  - ? Viral bronchitis. Dr. Melvyn Novas agrees with stopping ACE-I, has gravely voice. Could be GERD as well.   - On DC, Prednisone taper over next few days.   - Will give doxy 100mg  BID for 5 more days  - Received 2 doses of IV ceftriaxone empirically  - CXR with no infilitrates  - appreciate recs  Acute systolic/ diastolic HF  - LVEDP was 84ONGE on cath. ?spurious value. Does not appear volume overloaded.   - lasix to 80 BID IV yesterday - ? Reliability of UOP. BUN is increasing. Will change to PO. Lasix 40 PO QD.  - Watch creat. As outpatient.   Aortic stenosis  - mild to moderate  - hemodynamically should be of little consequence  CAD  - post CABG  GERD  - famotidine and Protonix.   Leukocytosis  - 20 down to 15. ?viral syndrome  Tobacco use  - encouraged cessation  Hyperlipidemia  -statin (on zetia at home)  He has been seeing a PCP (PA) in the same practice that his daughter works in as a Marketing executive. He will follow up early next week with them.  Also follow up in 2 weeks with Dr. Angelena Form or APP.   Optimally I would like for him to stay an extra day here in the hospital but he is insisting on going home today.    SKAINS, [place] 03/23/2015, 10:35 AM

## 2015-03-31 ENCOUNTER — Other Ambulatory Visit: Payer: Self-pay | Admitting: Physician Assistant

## 2015-03-31 ENCOUNTER — Encounter: Payer: Self-pay | Admitting: Physician Assistant

## 2015-03-31 DIAGNOSIS — E785 Hyperlipidemia, unspecified: Secondary | ICD-10-CM | POA: Diagnosis not present

## 2015-04-01 ENCOUNTER — Telehealth: Payer: Self-pay | Admitting: Physician Assistant

## 2015-04-01 LAB — LIPID PANEL W/O CHOL/HDL RATIO
Cholesterol, Total: 146 mg/dL (ref 100–199)
HDL: 42 mg/dL (ref >39)
LDL CALC: 77 mg/dL (ref 0–99)
Triglycerides: 136 mg/dL (ref 0–149)
VLDL Cholesterol Cal: 27 mg/dL (ref 5–40)

## 2015-04-01 LAB — HEPATIC FUNCTION PANEL
ALT: 32 IU/L (ref 0–44)
AST: 22 IU/L (ref 0–40)
Albumin: 3.8 g/dL (ref 3.5–4.8)
Alkaline Phosphatase: 66 IU/L (ref 39–117)
Bilirubin Total: 0.2 mg/dL (ref 0.0–1.2)
Bilirubin, Direct: <0.1 mg/dL (ref 0.00–0.40)
Total Protein: 6.2 g/dL (ref 6.0–8.5)

## 2015-04-01 NOTE — Telephone Encounter (Signed)
s/w pt's wife Gerilyn Pilgrim on file. wife notified of LFT lab work good though Lipid panel not back yet from PCP however once they come in we will call with the results. Wife said ok and thank you for our help.

## 2015-04-01 NOTE — Telephone Encounter (Signed)
S/w pt about lab results. pt asked about his K+ level. I explained that our office has not drawn K+ in a while on him and the last bmet was in the hospital 6/9 and this was normal at 3.8. Pt then states to me that he has stopped K+ a few days ago.  I asked pt why did he stop K+; pt states he is taking too many medications and even wants to decrease his cholesterol medication. I explained to pt that K+ is essential for our heart and kidney function and that his K+ is 3.8 and that was taking K+. I advised pt that he should had not stopped K+ with out d/w provider. Pt states to me that he will argue this out next week when he sees [place]. [place]. I will forward this on to [place] since he is seeing pt on 04/08/15.

## 2015-04-01 NOTE — Telephone Encounter (Signed)
New Message  Pt returned call from 06/13. Pt states that he received a call from [place]. Please assist

## 2015-04-07 NOTE — Progress Notes (Signed)
Cardiology Office Note   Date:  04/08/2015   ID:  Steve Durham, DOB 09/19/1946, MRN [id number]  PCP:  No PCP Per Patient  Cardiologist:  Dr. Lauree Chandler     Chief Complaint  Patient presents with  . Coronary Artery Disease  . Congestive Heart Failure  . Hospitalization Follow-up     History of Present Illness: Steve Durham is a 69 y.o. male with a hx of CAD s/p prior CABG 1993 and inf MI in 02/2014 with LM stenting (both SVGs known to be occluded), ICM, systolic CHF, aortic stenosis, HTN, HL. Turned down for redo CABG in 2013.   He was admitted 07/2014 with a non-STEMI. Urgent cardiac catheterization given ongoing symptoms demonstrated high-grade lesion proximal OM1 and high-grade in-stent restenosis in the distal portion of the left main stent. He underwent DES placement to the proximal OM1 and balloon angioplasty to the left main. Course was complicated by acute on chronic systolic CHF. He required diuresis with IV Lasix. LVEDP at his cardiac catheterization was elevated at 45 mmHg. EF was 25% at cardiac catheterization. Follow-up echocardiogram demonstrated an EF of 45% and mild aortic stenosis.  He was last seen by Dr. Lauree Chandler in 01/2015 and complained of recurrent chest pain suggestive of unstable angina.  He was set up for cardiac cath.  LHC demonstrated patent stents in the left main, ostial and proximal Circumflex and mid LAD beyond graft insertion. Medical Rx was recommended.    I saw him in follow-up 03/10/15. He continued to have chest discomfort that usually occurred at night while lying flat. I added Pepcid to his medical regimen in the evening.  He is overall doing well. He continues to have occasional chest pain. This usually occurs at night with lying flat. He notes it with certain meals. He denies dysphagia or odynophagia. He has seen gastroenterology in the past. He does not feel that the Protonix is working very well. He denies significant  exertional symptoms. He continues to note exertional shortness of breath. This is unchanged. He is NYHA 2b. He sleeps on 2 pillows chronically. He denies PND or edema. He denies syncope.  Admitted 5/31-6/5 with non-STEMI and acute on chronic systolic CHF in the setting of probable bronchitis. There were new EKG changes upon transfer from outside hospital. Troponin peaked at 1.38. Cardiac catheterization demonstrated stable anatomy. LVEDP was 36 and this was likely contributing to his shortness of breath and cough. He was placed on Lasix. He was treated with anti-biotics and prednisone taper. He was also evaluated by pulmonology. Echocardiogram demonstrated EF 45-50% with inferoseptal and inferior hypokinesis, moderate aortic stenosis with mean gradient 18 mmHg, biatrial enlargement, mildly reduced RVSF. Of note, his ACE inhibitor was stopped due to worsening creatinine.  He returns for follow-up.  Since DC, he has remained short of breath.  He is short of breath at rest.  He notes orthopnea and sleeps on 2 pillows typically.  He often has to get up in the middle of the night and sits in a recliner.  He wakes up often with coughing. Coughing seems to make his dyspnea worse. The cough is productive of clear sputum.  It was previously yellow.  No fevers.  He has tightness in his chest brought on by coughing.  This is relieved by NTG.  He denies syncope, near syncope.  He still smokes.     Studies/Reports Reviewed Today:  Echo 03/21/15 - Left ventricle: Severe hypokinesis of base inferoseptal segmentand base/mid inferior  segments. The cavity size was mildlydilated. Wall thickness was normal. Systolic function was mildlyreduced. The estimated ejection fraction was in the range of 45%to 50%. - Aortic valve: There was moderate stenosis. There was mildregurgitation. Mean gradient (S): 18 mm Hg. Peak gradient (S): 33 mm Hg. - Left atrium: The atrium was moderately dilated. - Right ventricle: The  cavity size was normal. Systolic functionwas mildly reduced. - Right atrium: The atrium was mildly dilated.  LHC 03/20/15  Severe three-vessel coronary artery disease.  Patent stent from the left main into the circumflex system.  LAD proximal occluded  Patent LIMA to LAD. Patent stent in the LAD.  RCA: Ostial/proximal occluded with left-to-right collaterals  Elevated LVEDP of 36 mm Hg. This is likely contributing to his shortness of breath and cough . IV Lasix given in the Cath Lab due to elevated LVEDP and cough which seemed to be getting worse during the procedure. He'll need his renal function followed closely. Will transfer to a stepdown bed so that he can be monitored more closely. Continue aggressive secondary prevention. Continue dual antiplatelet therapy.  LHC 02/12/15 LM: stent patent with mild diffuse 20% restenosis.  LAD:  Ostial 100%  LCx:  30% restenosis in the stented segment which extends from ostium into OM1. AVCFX occluded and fills L-L collats RCA:  Known to be occluded at the ostium. (not engaged). The distal vessel fills L-R collats  SVG to PDA is known to be occluded  SVG to OM is known to be occluded  LIMA to LAD is patent. The stent in the mid LAD beyond the graft insertion is patent with minimal restenosis.  LVEF=35-40%. Global hypokinesis with more severe hypokinesis in the inferior wall.   Echo 08/02/14 - Severe hypokinesis base/mid-inferolateralsegments and severe hypokinesis base inferior segment. EF is 45%. Mild LVH. - Mild AS.  The AS may be underestimated because of the LV dysfunction. Meangradient (S): 14 mm Hg. Peak gradient (S): 25 mm Hg. VTI ratio ofLVOT to aortic valve: 0.37. - Left atrium: The atrium was moderately dilated. - Right ventricle: The cavity size was normal. Systolic functionwas mildly reduced.  Carotid US (10/13): No sig ICA stenosis   Past Medical History  Diagnosis Date  . Hypertension   . Hyperlipoproteinemia   .  Aortic stenosis     a. Mod AS/AI by cath 07/2012.;  b.  Echo (07/31/13) is: EF 55-60%, mild aortic stenosis (mean gradient 13);  c. Echo (5/15):  EF 40-45%, mild AS (mean 12 mmHg), mod AI  . Back pain   . Carotid artery occlusion     a. Dopplers 07/2012: no significant high grade obstruction.  . Hyperlipidemia   . Abnormal CT scan, kidney     07/2012 - multiple small cysts  . Cholelithiasis     Seen on CT 07/2012  . Coronary artery disease     a. CABG 10/1991. b. cath 07/2012 with prog dsz, turned down for re-do CABG - for med rx for now.; c. NSTEMI/PCI: DES to mLAD ext into IMA graft;  d. Inf STEMI (5/15):  LM 60-70% then 99% before LAD, pLAD occl, pCFX stent 80+% ISR, oRCA 99% then occl (L-R collats to dRCA), L-LAD ok with patent stent, S-CFX occl (old), S-RCA occl (old); PCI: LM ext into CFX with Xience Alpine DES  . Ischemic cardiomyopathy     a. 2D ECHO: 08/02/2014; EF 45%; severe hypokinesis base/mid-inferolat segments and severe hypokinesis base inferior segment. Mild LVH, mild AS (may be underestimated due to LV  dysfunction).  Mean gradient (S): 14 mm Hg. Peak gradient (S): 25 mm Hg. VTI ratio of LVOT to aortic valve: 0.37. Mod LA dilation. Mild RV systolic dysfxn   . Heart failure systolic    Past Surgical History  Procedure Laterality Date  . Cardiac catheterization  04/03/99  . Carpal tunnel release  2007    Excision mass dorsal left wrist  . Hernia repair  1988  . Coronary artery bypass graft  1993  . Fasciotomy Right 07/30/2013    Procedure: OPEN FASCIOTOMY RIGHT RING FINGER & RIGHT SMALL FINGER MULTIPLE LEVELS;  Surgeon: Wynonia Sours, MD;  Location: [facility];  Service: Orthopedics;  Laterality: Right;  . Left heart catheterization with coronary/graft angiogram N/A 08/10/2012    Procedure: LEFT HEART CATHETERIZATION WITH Beatrix Fetters;  Surgeon: Hillary Bow, MD;  Location: [facility] CATH LAB;  Service: Cardiovascular;  Laterality: N/A;  . Percutaneous  coronary stent intervention (pci-s)  07/31/2013    Procedure: PERCUTANEOUS CORONARY STENT INTERVENTION (PCI-S);  Surgeon: Burnell Blanks, MD;  Location: [facility] CATH LAB;  Service: Cardiovascular;;  LIMA to LAD at the anastomosis with aortic root shot   . Left heart catheterization with coronary angiogram N/A 02/28/2014    Procedure: LEFT HEART CATHETERIZATION WITH CORONARY ANGIOGRAM;  Surgeon: Troy Sine, MD;  Location: [facility] CATH LAB;  Service: Cardiovascular;  Laterality: N/A;  . Left heart catheterization with coronary/graft angiogram N/A 08/01/2014    Procedure: LEFT HEART CATHETERIZATION WITH Beatrix Fetters;  Surgeon: Leonie Man, MD;  Location: [facility] CATH LAB;  Service: Cardiovascular;  Laterality: N/A;  . Left heart catheterization with coronary/graft angiogram N/A 02/12/2015    Procedure: LEFT HEART CATHETERIZATION WITH Beatrix Fetters;  Surgeon: Burnell Blanks, MD;  Location: [facility] CATH LAB;  Service: Cardiovascular;  Laterality: N/A;  . Cardiac catheterization N/A 03/20/2015    Procedure: Left Heart Cath and Cors/Grafts Angiography;  Surgeon: Jettie Booze, MD;  Location: [facility] CV LAB;  Service: Cardiovascular;  Laterality: N/A;     Current Outpatient Prescriptions  Medication Sig Dispense Refill  . amLODipine (NORVASC) 5 MG tablet Take 1 tablet (5 mg total) by mouth daily. 30 tablet 5  . aspirin EC 81 MG tablet Take 81 mg by mouth at bedtime.    . bisoprolol (ZEBETA) 5 MG tablet Take 0.5 tablets (2.5 mg total) by mouth 2 (two) times daily. 60 tablet 11  . clopidogrel (PLAVIX) 75 MG tablet TAKE 1 TABLET (75 MG TOTAL) BY MOUTH DAILY WITH BREAKFAST. 30 tablet 11  . dextromethorphan-guaiFENesin (MUCINEX DM) 30-600 MG per 12 hr tablet Take 2 tablets by mouth 2 (two) times daily. 30 tablet 0  . ezetimibe-simvastatin (VYTORIN) 10-40 MG per tablet Take 1 tablet by mouth at bedtime.     . famotidine (PEPCID) 10 MG tablet Take 10 mg by mouth at bedtime.      . fexofenadine (ALLEGRA) 180 MG tablet Take 180 mg by mouth daily.     . furosemide (LASIX) 40 MG tablet Take 1 tablet (40 mg total) by mouth daily. (Patient taking differently: Take 40 mg by mouth 2 (two) times daily. ) 30 tablet 11  . isosorbide mononitrate (IMDUR) 60 MG 24 hr tablet TAKE 1 AND 1/2 TABLET (90 MG TOTAL) BY MOUTH DAILY. 45 tablet 5  . nitroGLYCERIN (NITROSTAT) 0.4 MG SL tablet TAKE 1 TABLET UNDER THE TONGUE AS NEEDEDFOR CHEST PAIN ( MAY REPEAT EVERY 5 MINUTES X 3) 25 tablet 6  . Omega-3 Fatty Acids (  FISH OIL) 1000 MG CAPS Take 1,000 mg by mouth 2 (two) times daily.      . pantoprazole (PROTONIX) 40 MG tablet Take 1 tablet (40 mg total) by mouth daily. 30 tablet 6  . potassium chloride SA (K-DUR,KLOR-CON) 20 MEQ tablet Take 1 tablet (20 mEq total) by mouth daily. 30 tablet 5  . spironolactone (ALDACTONE) 25 MG tablet Take 12.5 mg by mouth 2 (two) times daily.  5   No current facility-administered medications for this visit.    Allergies:   Review of patient's allergies indicates no known allergies.    Social History:  The patient  reports that he has been smoking Cigarettes.  He has a 50 pack-year smoking history. He does not have any smokeless tobacco history on file. He reports that he drinks about 3.0 oz of alcohol per week. He reports that he does not use illicit drugs.   Family History:  The patient's family history includes Emphysema in his brother; Heart attack in his mother; Hypertension in his brother, father, mother, and sister. There is no history of Stroke.    ROS:   Please see the history of present illness.   Review of Systems  Cardiovascular: Positive for orthopnea.  All other systems reviewed and are negative.     PHYSICAL EXAM: VS:  BP 110/61 mmHg  Pulse 60  Ht 5\' 5"  (1.651 m)  Wt 150 lb (68.04 kg)  BMI 24.96 kg/m2  SpO2 99%    Wt Readings from Last 3 Encounters:  04/08/15 150 lb (68.04 kg)  03/20/15 149 lb 7.6 oz (67.8 kg)  03/10/15 155 lb  (70.308 kg)     GEN: Well nourished, well developed, in no acute distress HEENT: normal Neck: no JVD,   no masses Cardiac:  Normal S1/S2, RRR; 9-6/2 systolic murmur RUSB,  no rubs or gallops, no edema; L wrist without hematoma. Respiratory: decreased breath sounds bilaterally, with bilateral rales GI: soft, nontender, nondistended, + BS MS: no deformity or atrophy Skin: warm and dry  Neuro:  CNs II-XII intact, Strength and sensation are intact Psych: Normal affect   EKG:  EKG is ordered today.  It demonstrates: Sinus bradycardia, HR 55, normal axis, inferolateral T-wave inversions, no significant change when compared to prior tracings   Recent Labs: 03/31/2015: ALT 32 04/08/2015: BUN 35*; Creatinine, Ser 1.49; Hemoglobin 13.1; Platelets 279.0; Potassium 4.4; Pro B Natriuretic peptide (BNP) 543.0*; Sodium 136    Lipid Panel    Component Value Date/Time   CHOL 146 03/31/2015 0905   CHOL 143 03/01/2014 0129   TRIG 136 03/31/2015 0905   HDL 42 03/31/2015 0905   HDL 64 03/01/2014 0129   CHOLHDL 2.2 03/01/2014 0129   VLDL 12 03/01/2014 0129   LDLCALC 77 03/31/2015 0905   LDLCALC 67 03/01/2014 0129      ASSESSMENT AND PLAN:  Shortness of breath:  Patient remained short of breath. He does have some suggestion of rales on exam. However, his oxygen is normal. His neck veins are flat. His weight is stable. I am not completely convinced this is all heart failure. Recent heart catheterization with stable anatomy. He was supposed to follow-up with pulmonology after discharge. I suspect his lung disease from smoking is more than likely the cause of all of his symptoms. I will place him on a higher dose of Lasix for 3 days to see if this helps (Lasix 80 mg in the a.m. and 40 mg in the p.m). I will check a BMET, BNP,  CBC and chest x-ray today. I will arrange follow-up with pulmonology soon. He has been asked to call us with an update on his symptoms in the next few days.    Chronic systolic  CHF (congestive heart failure):  As noted, Lasix will be adjusted. BNP will be checked. If his BNP is significantly elevated, increase Lasix for longer.  Coronary artery disease involving native coronary artery of native heart without angina pectoris:  He has chest discomfort with coughing. I suspect that this is likely related to his cough. Continue amlodipine, aspirin, beta blocker, Plavix, statin, nitrates.  Ischemic cardiomyopathy:  ACE inhibitor was stopped secondary to worsening creatinine in the hospital as well as his cough. If renal function improves, consider adding ARB in the future. Continue beta blocker, nitrates.  Essential hypertension:  Controlled.  Hyperlipidemia:  LDL 6/16 77.  Continue Vytorin.  Tobacco abuse:  He has been advised to quit.  Aortic stenosis :  Mod by recent Echo.  GERD:  Continue current therapy.  Acute Kidney Injury:  Repeat BMET today.  Tobacco abuse: He has been advised to quit.   Current medicines are reviewed at length with the patient today.  Concerns regarding medicines are as outlined above.  The following changes have been made:   Modified Medications   No medications on file   Discontinued Medications   METHYLPREDNISOLONE (MEDROL) 4 MG TABLET    Take 1-6 tablets (4-24 mg total) by mouth daily.   New Prescriptions   No medications on file    Labs/ tests ordered today include:   Orders Placed This Encounter  Procedures  . DG Chest 2 View  . Basic Metabolic Panel (BMET)  . CBC w/Diff  . B Nat Peptide  . Basic Metabolic Panel (BMET)  . Ambulatory referral to Pulmonology  . EKG 12-Lead    Disposition:   FU me or Dr. Lauree Chandler 1-2 weeks.  Refer to Pulmonology.     Signed, Versie Starks, MHS 04/08/2015 4:32 PM    [facility] [address], [place], [place]  [address] Phone: [phone]; Fax: [phone]

## 2015-04-08 ENCOUNTER — Telehealth: Payer: Self-pay | Admitting: *Deleted

## 2015-04-08 ENCOUNTER — Encounter: Payer: Self-pay | Admitting: Physician Assistant

## 2015-04-08 ENCOUNTER — Ambulatory Visit
Admission: RE | Admit: 2015-04-08 | Discharge: 2015-04-08 | Disposition: A | Discharge status: Home / Self Care | Payer: Medicare Other | Source: Ambulatory Visit | Attending: Physician Assistant | Admitting: Physician Assistant

## 2015-04-08 ENCOUNTER — Ambulatory Visit (INDEPENDENT_AMBULATORY_CARE_PROVIDER_SITE_OTHER): Payer: Medicare Other | Admitting: Physician Assistant

## 2015-04-08 VITALS — BP 110/61 | HR 60 | Ht 65.0 in | Wt 150.0 lb

## 2015-04-08 DIAGNOSIS — Z720 Tobacco use: Secondary | ICD-10-CM

## 2015-04-08 DIAGNOSIS — R0602 Shortness of breath: Secondary | ICD-10-CM

## 2015-04-08 DIAGNOSIS — I255 Ischemic cardiomyopathy: Secondary | ICD-10-CM | POA: Diagnosis not present

## 2015-04-08 DIAGNOSIS — R059 Cough, unspecified: Secondary | ICD-10-CM

## 2015-04-08 DIAGNOSIS — I350 Nonrheumatic aortic (valve) stenosis: Secondary | ICD-10-CM

## 2015-04-08 DIAGNOSIS — R05 Cough: Secondary | ICD-10-CM | POA: Diagnosis not present

## 2015-04-08 DIAGNOSIS — I5022 Chronic systolic (congestive) heart failure: Secondary | ICD-10-CM

## 2015-04-08 DIAGNOSIS — I10 Essential (primary) hypertension: Secondary | ICD-10-CM

## 2015-04-08 DIAGNOSIS — N179 Acute kidney failure, unspecified: Secondary | ICD-10-CM | POA: Diagnosis not present

## 2015-04-08 DIAGNOSIS — E785 Hyperlipidemia, unspecified: Secondary | ICD-10-CM

## 2015-04-08 DIAGNOSIS — I2510 Atherosclerotic heart disease of native coronary artery without angina pectoris: Secondary | ICD-10-CM

## 2015-04-08 LAB — CBC WITH DIFFERENTIAL/PLATELET
BASOS PCT: 0.7 % (ref 0.0–3.0)
Basophils Absolute: 0.1 10*3/uL (ref 0.0–0.1)
EOS PCT: 0.8 % (ref 0.0–5.0)
Eosinophils Absolute: 0.1 10*3/uL (ref 0.0–0.7)
HCT: 40 % (ref 39.0–52.0)
HEMOGLOBIN: 13.1 g/dL (ref 13.0–17.0)
LYMPHS PCT: 17.8 % (ref 12.0–46.0)
Lymphs Abs: 2.3 10*3/uL (ref 0.7–4.0)
MCHC: 32.6 g/dL (ref 30.0–36.0)
MCV: 97.6 fl (ref 78.0–100.0)
MONOS PCT: 9.1 % (ref 3.0–12.0)
Monocytes Absolute: 1.2 10*3/uL — ABNORMAL HIGH (ref 0.1–1.0)
NEUTROS ABS: 9.1 10*3/uL — AB (ref 1.4–7.7)
Neutrophils Relative %: 71.6 % (ref 43.0–77.0)
Platelets: 279 10*3/uL (ref 150.0–400.0)
RBC: 4.1 Mil/uL — AB (ref 4.22–5.81)
RDW: 13.9 % (ref 11.5–15.5)
WBC: 12.7 10*3/uL — ABNORMAL HIGH (ref 4.0–10.5)

## 2015-04-08 LAB — BASIC METABOLIC PANEL
BUN: 35 mg/dL — AB (ref 6–23)
CO2: 28 mEq/L (ref 19–32)
CREATININE: 1.49 mg/dL (ref 0.40–1.50)
Calcium: 9.4 mg/dL (ref 8.4–10.5)
Chloride: 102 mEq/L (ref 96–112)
GFR: 49.43 mL/min — ABNORMAL LOW (ref >60.00)
Glucose, Bld: 104 mg/dL — ABNORMAL HIGH (ref 70–99)
POTASSIUM: 4.4 meq/L (ref 3.5–5.1)
SODIUM: 136 meq/L (ref 135–145)

## 2015-04-08 LAB — BRAIN NATRIURETIC PEPTIDE: PRO B NATRI PEPTIDE: 543 pg/mL — AB (ref 0.0–100.0)

## 2015-04-08 NOTE — Patient Instructions (Signed)
Medication Instructions:  1. INCREASE LASIX TO 80 MG IN THE MORNING AND 40 MG IN THE PM FOR 3 DAYS; AFTER 3 DAYS RESUME LASIX 40 MG TWICE DAILY  Labwork: 1. TODAY BMET, CBC W/DIFF, BNP  2. BMET IN 1 WEEK  Testing/Procedures: CHEST X-RAY TODAY AT [place]   Follow-Up: 1. A REFERRAL TO PULMONOLOGY HAS BEEN PLACED  2. YOU HAVE A FOLLOW UP WITH [place] 04/24/15 @ 9:30   Any Other Special Instructions Will Be Listed Below (If Applicable).

## 2015-04-08 NOTE — Telephone Encounter (Signed)
Pt notified of lab and cxr results with verbal understanding by phone.

## 2015-04-10 ENCOUNTER — Ambulatory Visit (INDEPENDENT_AMBULATORY_CARE_PROVIDER_SITE_OTHER): Payer: Medicare Other | Admitting: Internal Medicine

## 2015-04-10 ENCOUNTER — Encounter: Payer: Self-pay | Admitting: Internal Medicine

## 2015-04-10 VITALS — BP 112/60 | HR 68 | Ht 65.0 in | Wt 148.2 lb

## 2015-04-10 DIAGNOSIS — R05 Cough: Secondary | ICD-10-CM

## 2015-04-10 DIAGNOSIS — F17210 Nicotine dependence, cigarettes, uncomplicated: Secondary | ICD-10-CM | POA: Diagnosis not present

## 2015-04-10 DIAGNOSIS — R058 Other specified cough: Secondary | ICD-10-CM

## 2015-04-10 DIAGNOSIS — R0602 Shortness of breath: Secondary | ICD-10-CM | POA: Diagnosis not present

## 2015-04-10 DIAGNOSIS — I255 Ischemic cardiomyopathy: Secondary | ICD-10-CM | POA: Diagnosis not present

## 2015-04-10 MED ORDER — TRAMADOL HCL 50 MG PO TABS: ORAL_TABLET | ORAL | Status: DC

## 2015-04-10 MED ORDER — FAMOTIDINE 20 MG PO TABS: ORAL_TABLET | ORAL | Status: DC

## 2015-04-10 MED ORDER — PREDNISONE 10 MG PO TABS: ORAL_TABLET | ORAL | Status: DC

## 2015-04-10 NOTE — Progress Notes (Signed)
Subjective:    Patient ID: Steve Durham, male    DOB: 08/21/1946    MRN: [id number]  HPI  INITIAL PRESENTATION: 68 yowm active smoker with h/o bronchitis but only intermittent and not on any resp rx chronically with progressive doe x one month and one week pta acutely worse sob at rest with cough/ sore throat/ hoarseness while on ACEi admitted with chf by cards and ACEi d/c'd 03/19/15 due to renal issues   but still sob at rest and hacking cough with discolored mucus so PCCM service consulted am 6/4 (see notes) by Dr Marlou Porch  STUDIES admit   Heart Cath Cardiac Studies: Cath 6/216  Severe three-vessel coronary artery disease.  Patent stent from the left main into the circumflex system.  Patent LIMA to LAD. Patent stent in the LAD. Elevated LVEDP of 36 mm Hg    04/10/2015 1st Steve Durham of care from recent admit/ extended ov/  still smoking  Chief Complaint  Patient presents with  . Pulmonary Consult    Referred by Richardson Dopp, PA. Pt c/o SOB "for ages"- worse x 2.5 wks. He states breathing bothers him all of the time, but is esp worse with exertion.  He gets SOB walking approx 20 ft. He also c/o "constant" cough- prod with minimal white sputum. He has noticed CP recently that occurs after long coughing spell.  sleeps in recliner since around 2014 due to smothering and coughing and choking sensation  Am nasal congestion/chest congestion then some better p half hour but then hacking all day long / freq white x tsp or less at most, and mostly just in ams Coughs so hard has diffuse ant cp/ min improvement so far off acei/ moslty sob when coughing / inhalers in past not helpful    No obvious day to day or daytime variabilty or assoc chronic cough or cp or chest tightness, subjective wheeze overt sinus or hb symptoms. No unusual exp hx or h/o childhood pna/ asthma or knowledge of premature birth.  Sleeping ok without nocturnal  or early am  exacerbation  of respiratory  c/o's or need for noct saba. Also denies any obvious fluctuation of symptoms with weather or environmental changes or other aggravating or alleviating factors except as outlined above   Current Medications, Allergies, Complete Past Medical History, Past Surgical History, Family History, and Social History were reviewed in [employer] record.           Review of Systems  Constitutional: Negative for fever, chills, activity change, appetite change and unexpected weight change.  HENT: Positive for dental problem and sneezing. Negative for congestion, postnasal drip, rhinorrhea, sore throat, trouble swallowing and voice change.   Eyes: Negative for visual disturbance.  Respiratory: Positive for cough and shortness of breath. Negative for choking.   Cardiovascular: Positive for chest pain. Negative for leg swelling.  Gastrointestinal: Negative for nausea, vomiting and abdominal pain.  Genitourinary: Negative for difficulty urinating.       Indigestion  Musculoskeletal: Negative for arthralgias.  Skin: Negative for rash.  Psychiatric/Behavioral: Negative for behavioral problems and confusion.       Objective:   Physical Exam  amb wm nad but violent throat clearing   Wt Readings from Last 3 Encounters:  04/10/15 148 lb 3.2 oz (67.223 kg)  04/08/15 150 lb (68.04 kg)  03/20/15 149 lb 7.6 oz (67.8 kg)    Vital signs reviewed   HEENT: nl dentition, turbinates, and orophanx. Nl  external ear canals without cough reflex   NECK :  without JVD/Nodes/TM/ nl carotid upstrokes bilaterally   LUNGS: no acc muscle use, a few insp pops and squeaks bilaterally in bases posteriorly   without cough on insp or exp maneuvers   CV:  RRR  no s3 or murmur or increase in P2, no edema   ABD:  soft and nontender with nl excursion in the supine position. No bruits or organomegaly, bowel sounds nl  MS:  warm without deformities, calf tenderness,  cyanosis or clubbing  SKIN: warm and dry without lesions    NEURO:  alert, approp, no deficits     I personally reviewed images and agree with radiology impression as follows:  CXR:  04/08/15 No active cardiopulmonary disease. No significant change. Status post CABG.       Assessment & Plan:

## 2015-04-10 NOTE — Patient Instructions (Signed)
Prednisone 10 mg take  4 each am x 2 days,   2 each am x 2 days,  1 each am x 2 days and stop   Take delsym two tsp every 12 hours and supplement if needed with  tramadol 50 mg up to 2 every 4 hours to suppress the urge to cough. Swallowing water or using ice chips/non mint and menthol containing candies (such as lifesavers or sugarless jolly ranchers) are also effective.  You should rest your voice and avoid activities that you know make you cough.  Once you have eliminated the cough for 3 straight days try reducing the tramadol first,  then the delsym as tolerated.   Pantoprazole (protonix) 40 mg   Take  30-60 min before first meal of the day and Pepcid (famotidine)  20 mg one @  bedtime until return to office - this is the best way to tell whether stomach acid is contributing to your problem.    No fish oil  See Tammy NP w/in 2 weeks with all your medications, even over the counter meds, separated in two separate bags, the ones you take no matter what vs the ones you stop once you feel better and take only as needed when you feel you need them.   Tammy  will generate for you a new user friendly medication calendar that will put [place] all on the same page re: your medication use.     Without this process, it simply isn't possible to assure that we are providing  your outpatient care  with  the attention to detail we feel you deserve.   If we cannot assure that you're getting that kind of care,  then we cannot manage your problem effectively from this clinic.  Once you have seen Tammy and we are sure that we're all on the same page with your medication use she will arrange follow up with me with pfts

## 2015-04-11 ENCOUNTER — Encounter: Payer: Self-pay | Admitting: Internal Medicine

## 2015-04-11 NOTE — Assessment & Plan Note (Addendum)
04/11/2015  Walked RA x 3 laps @ 185 ft each stopped due to  End of study, no desats or sob , rapid pace   Despite the elevated lvedp noted  and smoking hx he does not appear at this point to be limited by chf/IHD  or copd but by cough .  See [facility] a/p

## 2015-04-11 NOTE — Assessment & Plan Note (Signed)

## 2015-04-11 NOTE — Assessment & Plan Note (Signed)
The most common causes of chronic cough in immunocompetent adults include the following: upper airway cough syndrome (UACS), previously referred to as postnasal drip syndrome (PNDS), which is caused by variety of rhinosinus conditions; (2) asthma; (3) GERD; (4) chronic bronchitis from cigarette smoking or other inhaled environmental irritants; (5) nonasthmatic eosinophilic bronchitis; and (6) bronchiectasis.   These conditions, singly or in combination, have accounted for up to 94% of the causes of chronic cough in prospective studies.   Other conditions have constituted no >6% of the causes in prospective studies These have included bronchogenic carcinoma, chronic interstitial pneumonia, sarcoidosis, left ventricular failure, ACEI-induced cough, and aspiration from a condition associated with pharyngeal dysfunction.    Chronic cough is often simultaneously caused by more than one condition. A single cause has been found from 38 to 82% of the time, multiple causes from 18 to 62%. Multiply caused cough has been the result of three diseases up to 42% of the time.       Based on hx and exam, this is most likely:  Classic Upper airway cough syndrome, so named because it's frequently impossible to sort out how much is  CR/sinusitis with freq throat clearing (which can be related to primary GERD)   vs  causing  secondary (" extra esophageal")  GERD from wide swings in gastric pressure that occur with throat clearing, often  promoting self use of mint and menthol lozenges that reduce the lower esophageal sphincter tone and exacerbate the problem further in a cyclical fashion.   These are the same pts (now being labeled as having "irritable larynx syndrome" by some cough centers) who not infrequently have a history of having failed to tolerate ace inhibitors,  dry powder inhalers or biphosphonates or report having atypical reflux symptoms that don't respond to standard doses of PPI , and are easily confused as  having aecopd or asthma flares by even experienced allergists/ pulmonologists.   The first step is to maximize acid suppression and eliminate cyclical coughing then regroup in two weeks  I had an extended discussion with the patient reviewing all relevant studies completed to date and  Lasting  25 minutes  - Each maintenance medication was reviewed in detail including most importantly the difference between maintenance and as needed and under what circumstances the prns are to be used.  Please see instructions for details which were reviewed in writing and the patient given a copy.

## 2015-04-17 ENCOUNTER — Other Ambulatory Visit: Payer: Self-pay | Admitting: Cardiovascular Disease

## 2015-04-17 ENCOUNTER — Other Ambulatory Visit (INDEPENDENT_AMBULATORY_CARE_PROVIDER_SITE_OTHER): Payer: Medicare Other | Admitting: *Deleted

## 2015-04-17 DIAGNOSIS — N179 Acute kidney failure, unspecified: Secondary | ICD-10-CM

## 2015-04-17 DIAGNOSIS — I5022 Chronic systolic (congestive) heart failure: Secondary | ICD-10-CM | POA: Diagnosis not present

## 2015-04-17 DIAGNOSIS — I2510 Atherosclerotic heart disease of native coronary artery without angina pectoris: Secondary | ICD-10-CM

## 2015-04-17 LAB — BASIC METABOLIC PANEL
BUN: 31 mg/dL — ABNORMAL HIGH (ref 6–23)
CHLORIDE: 103 meq/L (ref 96–112)
CO2: 28 meq/L (ref 19–32)
Calcium: 9.2 mg/dL (ref 8.4–10.5)
Creatinine, Ser: 1.45 mg/dL (ref 0.40–1.50)
GFR: 51 mL/min — AB (ref >60.00)
GLUCOSE: 71 mg/dL (ref 70–99)
POTASSIUM: 4.3 meq/L (ref 3.5–5.1)
SODIUM: 139 meq/L (ref 135–145)

## 2015-04-18 ENCOUNTER — Telehealth: Payer: Self-pay | Admitting: *Deleted

## 2015-04-18 NOTE — Telephone Encounter (Signed)
Pt notified of lab resultsby phone with verbal understanding. I asked pt how his breathing was doing since he did not cb after the 3 days of taking extra lasix. Pt said he did not cb because it did not help. I asked how was his weight doing, any sob, or edema. Pt states his weight is fine and sob is doing somewhat better. Pt was referred to Dr. Melvyn Novas by our office and pt just saw Dr. Melvyn Novas which he states Dr. Melvyn Novas gave him a cough medicine that he said seems to be helping. I said I will let Richardson Dopp, PA know of this and that we will see him on 7/7 for his f/u with Scott. Pt said ok and thank you.

## 2015-04-23 NOTE — Progress Notes (Signed)
Cardiology Office Note   Date:  04/24/2015   ID:  Steve Durham, DOB 10/04/1946, MRN [id number]  PCP:  No PCP Per Patient  Cardiologist:  Dr. Lauree Chandler     Chief Complaint  Patient presents with  . Shortness of Breath  . Congestive Heart Failure     History of Present Illness: Steve Durham is a 69 y.o. male with a hx of CAD s/p prior CABG 1993 and inf MI in 02/2014 with LM stenting (both SVGs known to be occluded), ICM, systolic CHF, aortic stenosis, HTN, HL. Turned down for redo CABG in 2013.   He was admitted 07/2014 with a non-STEMI. Urgent cardiac catheterization given ongoing symptoms demonstrated high-grade lesion proximal OM1 and high-grade in-stent restenosis in the distal portion of the left main stent. He underwent DES placement to the proximal OM1 and balloon angioplasty to the left main. Course was complicated by acute on chronic systolic CHF. He required diuresis with IV Lasix. LVEDP at his cardiac catheterization was elevated at 45 mmHg. EF was 25% at cardiac catheterization. Follow-up echocardiogram demonstrated an EF of 45% and mild aortic stenosis.  He was last seen by Dr. Lauree Chandler in 01/2015 and complained of recurrent chest pain suggestive of unstable angina.  He was set up for cardiac cath.  LHC demonstrated patent stents in the left main, ostial and proximal Circumflex and mid LAD beyond graft insertion. Medical Rx was recommended.    Admitted 5/31-6/5 with non-STEMI and acute on chronic systolic CHF in the setting of probable bronchitis. There were new EKG changes upon transfer from outside hospital. Troponin peaked at 1.38. Cardiac catheterization demonstrated stable anatomy. LVEDP was 36 and this was likely contributing to his shortness of breath and cough. He was placed on Lasix. He was treated with antibiotics and prednisone taper. He was also evaluated by pulmonology. Echocardiogram demonstrated EF 45-50% with inferoseptal and inferior  hypokinesis, moderate aortic stenosis with mean gradient 18 mmHg, biatrial enlargement, mildly reduced RVSF. Of note, his ACE inhibitor was stopped due to worsening creatinine.  I saw him 04/08/15 in follow-up. He continued to complain of significant dyspnea. I placed him on the higher dose of Lasix for 3 days. I also had him follow-up with Dr. Melvyn Novas with pulmonology. He was placed on prednisone as well as PPI therapy.  He returns for FU.  Breathing and cough are improved with the treatment started by Dr. Melvyn Novas.  However, since last seen, he has been having chest discomfort and R arm discomfort late at night and early in the AM.  He describes what sounds like orthopnea.  He develops chest discomfort with lying flat and has to sit up to get relief.  He has resolution of symptoms if he sleeps sitting up.  However, after awakening, he gets symptoms with just doing ADLs.  He usually takes NTG with this (1 or 2 tabs) with relief.  Denies edema, syncope.   Studies/Reports Reviewed Today:  Echo 03/21/15 - Left ventricle: Severe hypokinesis of base inferoseptal segmentand base/mid inferior segments. The cavity size was mildlydilated. Wall thickness was normal. Systolic function was mildlyreduced. The estimated ejection fraction was in the range of 45%to 50%. - Aortic valve: There was moderate stenosis. There was mildregurgitation. Mean gradient (S): 18 mm Hg. Peak gradient (S): 33 mm Hg. - Left atrium: The atrium was moderately dilated. - Right ventricle: The cavity size was normal. Systolic functionwas mildly reduced. - Right atrium: The atrium was mildly dilated.   LHC  03/20/15  Severe three-vessel coronary artery disease.  Patent stent from the left main into the circumflex system.  LAD proximal occluded  Patent LIMA to LAD. Patent stent in the LAD.  RCA: Ostial/proximal occluded with left-to-right collaterals  Elevated LVEDP of 36 mm Hg. This is likely contributing to his shortness  of breath and cough . IV Lasix given in the Cath Lab due to elevated LVEDP and cough which seemed to be getting worse during the procedure. He'll need his renal function followed closely. Will transfer to a stepdown bed so that he can be monitored more closely. Continue aggressive secondary prevention. Continue dual antiplatelet therapy.   LHC 02/12/15 LM: stent patent with mild diffuse 20% restenosis.  LAD:  Ostial 100%  LCx:  30% restenosis in the stented segment which extends from ostium into OM1. AVCFX occluded and fills L-L collats RCA:  Known to be occluded at the ostium. (not engaged). The distal vessel fills L-R collats  SVG to PDA is known to be occluded  SVG to OM is known to be occluded  LIMA to LAD is patent. The stent in the mid LAD beyond the graft insertion is patent with minimal restenosis.  LVEF=35-40%. Global hypokinesis with more severe hypokinesis in the inferior wall.    Echo 08/02/14 - Severe hypokinesis base/mid-inferolateralsegments and severe hypokinesis base inferior segment. EF is 45%. Mild LVH. - Mild AS.  The AS may be underestimated because of the LV dysfunction. Meangradient (S): 14 mm Hg. Peak gradient (S): 25 mm Hg. VTI ratio ofLVOT to aortic valve: 0.37. - Left atrium: The atrium was moderately dilated. - Right ventricle: The cavity size was normal. Systolic functionwas mildly reduced.   Carotid US (10/13): No sig ICA stenosis   Past Medical History  Diagnosis Date  . Hypertension   . Hyperlipoproteinemia   . Aortic stenosis     a. Mod AS/AI by cath 07/2012.;  b.  Echo (07/31/13) is: EF 55-60%, mild aortic stenosis (mean gradient 13);  c. Echo (5/15):  EF 40-45%, mild AS (mean 12 mmHg), mod AI  . Back pain   . Carotid artery occlusion     a. Dopplers 07/2012: no significant high grade obstruction.  . Hyperlipidemia   . Abnormal CT scan, kidney     07/2012 - multiple small cysts  . Cholelithiasis     Seen on CT 07/2012  . Coronary artery  disease     a. CABG 10/1991. b. cath 07/2012 with prog dsz, turned down for re-do CABG - for med rx for now.; c. NSTEMI/PCI: DES to mLAD ext into IMA graft;  d. Inf STEMI (5/15):  LM 60-70% then 99% before LAD, pLAD occl, pCFX stent 80+% ISR, oRCA 99% then occl (L-R collats to dRCA), L-LAD ok with patent stent, S-CFX occl (old), S-RCA occl (old); PCI: LM ext into CFX with Xience Alpine DES  . Ischemic cardiomyopathy     a. 2D ECHO: 08/02/2014; EF 45%; severe hypokinesis base/mid-inferolat segments and severe hypokinesis base inferior segment. Mild LVH, mild AS (may be underestimated due to LV dysfunction).  Mean gradient (S): 14 mm Hg. Peak gradient (S): 25 mm Hg. VTI ratio of LVOT to aortic valve: 0.37. Mod LA dilation. Mild RV systolic dysfxn   . Heart failure systolic    Past Surgical History  Procedure Laterality Date  . Cardiac catheterization  04/03/99  . Carpal tunnel release  2007    Excision mass dorsal left wrist  . Hernia repair  1988  .  Coronary artery bypass graft  1993  . Fasciotomy Right 07/30/2013    Procedure: OPEN FASCIOTOMY RIGHT RING FINGER & RIGHT SMALL FINGER MULTIPLE LEVELS;  Surgeon: Wynonia Sours, MD;  Location: [facility];  Service: Orthopedics;  Laterality: Right;  . Left heart catheterization with coronary/graft angiogram N/A 08/10/2012    Procedure: LEFT HEART CATHETERIZATION WITH Beatrix Fetters;  Surgeon: Hillary Bow, MD;  Location: [facility] CATH LAB;  Service: Cardiovascular;  Laterality: N/A;  . Percutaneous coronary stent intervention (pci-s)  07/31/2013    Procedure: PERCUTANEOUS CORONARY STENT INTERVENTION (PCI-S);  Surgeon: Burnell Blanks, MD;  Location: [facility] CATH LAB;  Service: Cardiovascular;;  LIMA to LAD at the anastomosis with aortic root shot   . Left heart catheterization with coronary angiogram N/A 02/28/2014    Procedure: LEFT HEART CATHETERIZATION WITH CORONARY ANGIOGRAM;  Surgeon: Troy Sine, MD;  Location: [facility] CATH LAB;   Service: Cardiovascular;  Laterality: N/A;  . Left heart catheterization with coronary/graft angiogram N/A 08/01/2014    Procedure: LEFT HEART CATHETERIZATION WITH Beatrix Fetters;  Surgeon: Leonie Man, MD;  Location: [facility] CATH LAB;  Service: Cardiovascular;  Laterality: N/A;  . Left heart catheterization with coronary/graft angiogram N/A 02/12/2015    Procedure: LEFT HEART CATHETERIZATION WITH Beatrix Fetters;  Surgeon: Burnell Blanks, MD;  Location: [facility] CATH LAB;  Service: Cardiovascular;  Laterality: N/A;  . Cardiac catheterization N/A 03/20/2015    Procedure: Left Heart Cath and Cors/Grafts Angiography;  Surgeon: Jettie Booze, MD;  Location: [place] CV LAB;  Service: Cardiovascular;  Laterality: N/A;     Current Outpatient Prescriptions  Medication Sig Dispense Refill  . amLODipine (NORVASC) 5 MG tablet Take 1 tablet (5 mg total) by mouth daily. 30 tablet 5  . aspirin EC 81 MG tablet Take 81 mg by mouth at bedtime.    . bisoprolol (ZEBETA) 5 MG tablet Take 0.5 tablets (2.5 mg total) by mouth 2 (two) times daily. 60 tablet 11  . clopidogrel (PLAVIX) 75 MG tablet TAKE 1 TABLET (75 MG TOTAL) BY MOUTH DAILY WITH BREAKFAST. 30 tablet 11  . ezetimibe-simvastatin (VYTORIN) 10-40 MG per tablet Take 1 tablet by mouth at bedtime.     . famotidine (PEPCID) 20 MG tablet One at bedtime 30 tablet 11  . fexofenadine (ALLEGRA) 180 MG tablet Take 180 mg by mouth daily.     . furosemide (LASIX) 40 MG tablet Take 1 tablet (40 mg total) by mouth 2 (two) times daily. 60 tablet 11  . isosorbide mononitrate (IMDUR) 60 MG 24 hr tablet TAKE 1 AND 1/2 TABLET (90 MG TOTAL) BY MOUTH DAILY. 45 tablet 5  . nitroGLYCERIN (NITROSTAT) 0.4 MG SL tablet TAKE 1 TABLET UNDER THE TONGUE AS NEEDEDFOR CHEST PAIN ( MAY REPEAT EVERY 5 MINUTES X 3) 25 tablet 6  . pantoprazole (PROTONIX) 40 MG tablet Take 1 tablet (40 mg total) by mouth daily. 30 tablet 6  . potassium chloride SA (K-DUR,KLOR-CON)  20 MEQ tablet Take 1 tablet (20 mEq total) by mouth daily. 30 tablet 5  . spironolactone (ALDACTONE) 25 MG tablet Take 12.5 mg by mouth 2 (two) times daily.  5  . ranolazine (RANEXA) 500 MG 12 hr tablet Take 1 tablet (500 mg total) by mouth 2 (two) times daily. 60 tablet 11   No current facility-administered medications for this visit.    Allergies:   Review of patient's allergies indicates no known allergies.    Social History:  The patient  reports that  he has been smoking Cigarettes.  He has a 57 pack-year smoking history. He has never used smokeless tobacco. He reports that he drinks about 3.0 oz of alcohol per week. He reports that he does not use illicit drugs.   Family History:  The patient's family history includes Allergies in his sister; Emphysema in his brother; Heart attack in his mother; Hypertension in his brother, father, mother, and sister. There is no history of Stroke.    ROS:   Please see the history of present illness.   Review of Systems  Constitution: Positive for chills. Negative for fever.  Gastrointestinal: Negative for hematochezia and melena.  Genitourinary: Negative for hematuria.  All other systems reviewed and are negative.     PHYSICAL EXAM: VS:  BP 118/60 mmHg  Pulse 64  Ht 5\' 5"  (1.651 m)  Wt 148 lb (67.132 kg)  BMI 24.63 kg/m2  SpO2 98%    Wt Readings from Last 3 Encounters:  04/24/15 148 lb (67.132 kg)  04/10/15 148 lb 3.2 oz (67.223 kg)  04/08/15 150 lb (68.04 kg)     GEN: Well nourished, well developed, in no acute distress HEENT: normal Neck: no JVD,   no masses Cardiac:  Normal S1/S2, RRR; 7-2/6 systolic murmur RUSB,  no rubs or gallops, no edema  Respiratory: decreased breath sounds bilaterally, with faint bilateral rales GI: soft, nontender, nondistended, + BS MS: no deformity or atrophy Skin: warm and dry  Neuro:  CNs II-XII intact, Strength and sensation are intact Psych: Normal affect   EKG:  EKG is ordered today.  It  demonstrates:  NSR, HR 64, inf-lat TWI, no change from prior tracing.    Recent Labs: 03/31/2015: ALT 32 04/08/2015: Hemoglobin 13.1; Platelets 279.0; Pro B Natriuretic peptide (BNP) 543.0* 04/17/2015: BUN 31*; Creatinine, Ser 1.45; Potassium 4.3; Sodium 139    Lipid Panel    Component Value Date/Time   CHOL 146 03/31/2015 0905   CHOL 143 03/01/2014 0129   TRIG 136 03/31/2015 0905   HDL 42 03/31/2015 0905   HDL 64 03/01/2014 0129   CHOLHDL 2.2 03/01/2014 0129   VLDL 12 03/01/2014 0129   LDLCALC 77 03/31/2015 0905   LDLCALC 67 03/01/2014 0129      ASSESSMENT AND PLAN:  Shortness of breath:  With assoc cough.  Symptoms are better with prednisone, etc Rx'd by Dr. Melvyn Novas.  Unfortunately, he continues to smoke.    Chest Pain:  Symptoms sound c/w angina.  This is his typical.  He has had 2 caths in the last 2 months.  Reviewed with Dr. Lauree Chandler.  Will try to treat medically.  Continue Amlodipine, nitrates, beta-blocker.  Will add Ranexa and adjust diuretics.  Chronic systolic CHF (congestive heart failure):  As noted, I suspect some of his symptoms may be related to volume excess.  Will increase his Lasix to 40 mg Twice daily. Check BMET, BNP 1 week.   Coronary artery disease involving native coronary artery of native heart with angina pectoris:  As noted, he is having increasing angina.  Will start Ranexa 500 mg Twice daily.  Increase Lasix as noted.  Continue amlodipine, aspirin, beta blocker, Plavix, statin, nitrates.  Ischemic cardiomyopathy:  ACE inhibitor was stopped secondary to worsening creatinine in the hospital as well as his cough. Consider adding ARB in the future. Continue beta blocker, nitrates.  Essential hypertension:  Controlled.  Hyperlipidemia:  LDL 6/16 77.  Continue Vytorin.  Tobacco abuse:  He will likely never quit.  Aortic stenosis :  Mod by recent Echo.  Chronic kidney disease:  Recent creatinine stable.  Keep close eye on renal function with  adjustments in lasix.    Medication Adjustments: Current medicines are reviewed at length with the patient today.  Concerns regarding medicines are as outlined above.  The following changes have been made:   Modified Medications   Modified Medication Previous Medication   FUROSEMIDE (LASIX) 40 MG TABLET furosemide (LASIX) 40 MG tablet      Take 1 tablet (40 mg total) by mouth 2 (two) times daily.    Take 1 tablet (40 mg total) by mouth daily.   Discontinued Medications   DEXTROMETHORPHAN-GUAIFENESIN (MUCINEX DM) 30-600 MG PER 12 HR TABLET    Take 2 tablets by mouth 2 (two) times daily.   PREDNISONE (DELTASONE) 10 MG TABLET    Take  4 each am x 2 days,   2 each am x 2 days,  1 each am x 2 days and stop   TRAMADOL (ULTRAM) 50 MG TABLET    1-2 every 4 hours as needed for cough or pain   VYTORIN 10-40 MG PER TABLET    TAKE 1 TABLET BY MOUTH EVERY DAY AT BEDTIME   New Prescriptions   RANOLAZINE (RANEXA) 500 MG 12 HR TABLET    Take 1 tablet (500 mg total) by mouth 2 (two) times daily.    Labs/ tests ordered today include:   Orders Placed This Encounter  Procedures  . EKG 12-Lead     Disposition:   FU Dr. Lauree Chandler or me in 3 weeks.      Signed, Versie Starks, MHS 04/24/2015 1:21 PM    [facility] [address], [place], [place]  [address] Phone: [phone]; Fax: [phone]

## 2015-04-24 ENCOUNTER — Ambulatory Visit (INDEPENDENT_AMBULATORY_CARE_PROVIDER_SITE_OTHER): Payer: Medicare Other | Admitting: Physician Assistant

## 2015-04-24 ENCOUNTER — Encounter: Payer: Self-pay | Admitting: Physician Assistant

## 2015-04-24 VITALS — BP 118/60 | HR 64 | Ht 65.0 in | Wt 148.0 lb

## 2015-04-24 DIAGNOSIS — I5022 Chronic systolic (congestive) heart failure: Secondary | ICD-10-CM

## 2015-04-24 DIAGNOSIS — N189 Chronic kidney disease, unspecified: Secondary | ICD-10-CM

## 2015-04-24 DIAGNOSIS — I10 Essential (primary) hypertension: Secondary | ICD-10-CM

## 2015-04-24 DIAGNOSIS — I350 Nonrheumatic aortic (valve) stenosis: Secondary | ICD-10-CM | POA: Diagnosis not present

## 2015-04-24 DIAGNOSIS — E785 Hyperlipidemia, unspecified: Secondary | ICD-10-CM

## 2015-04-24 DIAGNOSIS — I25119 Atherosclerotic heart disease of native coronary artery with unspecified angina pectoris: Secondary | ICD-10-CM | POA: Diagnosis not present

## 2015-04-24 DIAGNOSIS — R0602 Shortness of breath: Secondary | ICD-10-CM | POA: Diagnosis not present

## 2015-04-24 DIAGNOSIS — Z720 Tobacco use: Secondary | ICD-10-CM

## 2015-04-24 DIAGNOSIS — I255 Ischemic cardiomyopathy: Secondary | ICD-10-CM

## 2015-04-24 MED ORDER — RANOLAZINE ER 500 MG PO TB12: 500.0000 mg | ORAL_TABLET | Freq: Two times a day (BID) | ORAL | Status: DC

## 2015-04-24 MED ORDER — FUROSEMIDE 40 MG PO TABS: 40.0000 mg | ORAL_TABLET | Freq: Two times a day (BID) | ORAL | Status: DC

## 2015-04-24 NOTE — Patient Instructions (Addendum)
Medication Instructions:  1. START RANEXA 500 MG 1 TABLET TWICE DAILY; YOU HAVE BEEN GIVEN SAMPLES OF RANEXA  2. INCREASE LASIX 40 MG TO TWICE DAILY; A REFILL HAS BEEN SENT IN  Labwork: 1. BMET, BNP TO BE DONE IN [place]; YOU HAVE BEEN GIVEN AN RX TO HAVE LAB WORK DONE WITH THE RESULTS TO BE FAXED TO [facility] [place]   Testing/Procedures: NONE  Follow-Up: 2-3 WEEKS WITH SCOTT WEAVER, PAC SAME DAY DR. Angelena Form IS IN THE OFFICE  Any Other Special Instructions Will Be Listed Below (If Applicable).

## 2015-04-28 ENCOUNTER — Other Ambulatory Visit: Payer: Self-pay | Admitting: *Deleted

## 2015-04-28 DIAGNOSIS — I25119 Atherosclerotic heart disease of native coronary artery with unspecified angina pectoris: Secondary | ICD-10-CM

## 2015-04-28 DIAGNOSIS — E785 Hyperlipidemia, unspecified: Secondary | ICD-10-CM

## 2015-04-28 MED ORDER — ATORVASTATIN CALCIUM 80 MG PO TABS: 80.0000 mg | ORAL_TABLET | Freq: Every day | ORAL | Status: DC

## 2015-04-28 NOTE — Telephone Encounter (Signed)
I called pt today to advise per Brynda Rim. PA we need to decrease his dose of Vytorin from 10/40 to 10/20 due to new start of Ranexa w/FLP/LFT 08/18/15. Pt does state he would like something else instead of Vytorin due to cost. I will d/w PA and cb. pt ok

## 2015-04-28 NOTE — Telephone Encounter (Signed)
lmptcb to advise d/c vytorin and start atorvastatin 80 mg daily, FLP/LFT in 2 months. I will change med list to reflect med change and new rx sent in for lipitor 80 mg.

## 2015-04-28 NOTE — Telephone Encounter (Signed)
DC Vytorin. Start Atorvastatin 80 mg QD. Check Lipids and LFTs 2 mos. Richardson Dopp, PA-C   04/28/2015 4:43 PM

## 2015-04-29 ENCOUNTER — Telehealth: Payer: Self-pay | Admitting: Physician Assistant

## 2015-04-29 ENCOUNTER — Ambulatory Visit: Payer: Medicare Other | Admitting: Adult Health

## 2015-04-29 NOTE — Telephone Encounter (Signed)
New message ° ° ° ° ° °Returning Carol's call ° ° ° ° ° °

## 2015-04-29 NOTE — Telephone Encounter (Signed)
S/w pt today per Brynda Rim. PA d/c Vytorin and started lipitor 80 mg ; w/FLP/LFT in 2 months. Pt said he is going to finish out vytorin 1st since it was so expensive. Pt has appt 7/28 w/PA.

## 2015-05-01 ENCOUNTER — Other Ambulatory Visit: Payer: Self-pay | Admitting: Physician Assistant

## 2015-05-01 ENCOUNTER — Encounter: Payer: Self-pay | Admitting: Physician Assistant

## 2015-05-01 DIAGNOSIS — N182 Chronic kidney disease, stage 2 (mild): Secondary | ICD-10-CM | POA: Diagnosis not present

## 2015-05-01 DIAGNOSIS — R0602 Shortness of breath: Secondary | ICD-10-CM | POA: Diagnosis not present

## 2015-05-02 LAB — BASIC METABOLIC PANEL
BUN/Creatinine Ratio: 16 (ref 10–22)
BUN: 23 mg/dL (ref 8–27)
CALCIUM: 9.8 mg/dL (ref 8.6–10.2)
CO2: 28 mmol/L (ref 18–29)
Chloride: 99 mmol/L (ref 97–108)
Creatinine, Ser: 1.48 mg/dL — ABNORMAL HIGH (ref 0.76–1.27)
GFR calc Af Amer: 55 mL/min/{1.73_m2} — ABNORMAL LOW (ref >59)
GFR calc non Af Amer: 47 mL/min/{1.73_m2} — ABNORMAL LOW (ref >59)
GLUCOSE: 118 mg/dL — AB (ref 65–99)
Potassium: 5.1 mmol/L (ref 3.5–5.2)
Sodium: 139 mmol/L (ref 134–144)

## 2015-05-02 LAB — BRAIN NATRIURETIC PEPTIDE: BNP: 407.2 pg/mL — AB (ref 0.0–100.0)

## 2015-05-02 NOTE — Telephone Encounter (Signed)
Pt notified of lab results by phone with verbal undestanding. I advised that we did not have the results yet for the BNP; however when they come in and have been read I will let him know. Pt said ok and thank you.

## 2015-05-05 ENCOUNTER — Telehealth: Payer: Self-pay | Admitting: *Deleted

## 2015-05-05 NOTE — Telephone Encounter (Signed)
Pt notiified of all lab results with verbal understanding. Pt said he is going to finish the Vytorin before starting Lipitor 80 mg. Pt said he has about 1-2 weeks left. Pt will come in for lab work on 10/31.

## 2015-05-14 NOTE — Progress Notes (Signed)
Cardiology Office Note   Date:  05/15/2015   ID:  Steve Durham, DOB Apr 20, 1946, MRN [id number]  PCP:  No PCP Per Patient  Cardiologist:  Dr. Lauree Chandler     Chief Complaint  Patient presents with  . Coronary Artery Disease  . Congestive Heart Failure     History of Present Illness: Steve Durham is a 68 y.o. male with a hx of CAD s/p prior CABG 1993 and inf MI in 02/2014 with LM stenting (both SVGs known to be occluded), ICM, systolic CHF, aortic stenosis, HTN, HL. Turned down for redo CABG in 2013.   He was admitted 07/2014 with a non-STEMI. Urgent cardiac catheterization given ongoing symptoms demonstrated high-grade lesion proximal OM1 and high-grade in-stent restenosis in the distal portion of the left main stent. He underwent DES placement to the proximal OM1 and balloon angioplasty to the left main. Course was complicated by acute on chronic systolic CHF. He required diuresis with IV Lasix. LVEDP at his cardiac catheterization was elevated at 45 mmHg. EF was 25% at cardiac catheterization. Follow-up echocardiogram demonstrated an EF of 45% and mild aortic stenosis.  He saw Dr. Lauree Chandler in 01/2015 and complained of recurrent chest pain suggestive of unstable angina.  LHC demonstrated patent stents in the left main, ostial and proximal Circumflex and mid LAD beyond graft insertion. Medical Rx was recommended.    Admitted 02/2015 with non-STEMI and a/c systolic HF in the setting of probable bronchitis. He had new EKG changes and Troponin peaked at 1.38. LHC demonstrated stable anatomy. LVEDP was 36 and volume overload was likely contributing to his shortness of breath and cough. He was placed on Lasix, antibiotics and prednisone taper. He was also evaluated by pulmonology. Echocardiogram demonstrated EF 45-50% with inferoseptal and inferior hypokinesis, moderate aortic stenosis with mean gradient 18 mmHg, biatrial enlargement, mildly reduced RVSF. Of note, his  ACE inhibitor was stopped due to worsening creatinine.  He is followed by Dr. Melvyn Novas with pulmonology. He was placed on prednisone as well as PPI therapy.  I saw him 7/7 and his breathing and cough were improved.  He did complain of CP. I started him on Ranexa 500 bid.  Lasix was increased.  He returns for FU.  Here alone. He is doing well.  Chest pain is significantly improved. He has only taken 2 NTG since last seen.  He was previously taking several a day.  No orthopnea, PND, edema.  No syncope.  Breathing is stable.  Cough is improved.     Studies/Reports Reviewed Today:  Echo 03/21/15 - Left ventricle: Severe hypokinesis of base inferoseptal segmentand base/mid inferior segments. The cavity size was mildlydilated. Wall thickness was normal. Systolic function was mildlyreduced. The estimated ejection fraction was in the range of 45%to 50%. - Aortic valve: There was moderate stenosis. There was mildregurgitation. Mean gradient (S): 18 mm Hg. Peak gradient (S): 33 mm Hg. - Left atrium: The atrium was moderately dilated. - Right ventricle: The cavity size was normal. Systolic functionwas mildly reduced. - Right atrium: The atrium was mildly dilated.   LHC 03/20/15  Severe three-vessel coronary artery disease.  Patent stent from the left main into the circumflex system.  LAD proximal occluded  Patent LIMA to LAD. Patent stent in the LAD.  RCA: Ostial/proximal occluded with left-to-right collaterals  Elevated LVEDP of 36 mm Hg. This is likely contributing to his shortness of breath and cough . IV Lasix given in the Cath Lab due to elevated LVEDP  and cough which seemed to be getting worse during the procedure. He'll need his renal function followed closely. Will transfer to a stepdown bed so that he can be monitored more closely. Continue aggressive secondary prevention. Continue dual antiplatelet therapy.   LHC 02/12/15 LM: stent patent with mild diffuse 20% restenosis.   LAD:  Ostial 100%  LCx:  30% restenosis in the stented segment which extends from ostium into OM1. AVCFX occluded and fills L-L collats RCA:  Known to be occluded at the ostium. (not engaged). The distal vessel fills L-R collats  SVG to PDA is known to be occluded  SVG to OM is known to be occluded  LIMA to LAD is patent. The stent in the mid LAD beyond the graft insertion is patent with minimal restenosis.  LVEF=35-40%. Global hypokinesis with more severe hypokinesis in the inferior wall.    Echo 08/02/14 - Severe hypokinesis base/mid-inferolateralsegments and severe hypokinesis base inferior segment. EF is 45%. Mild LVH. - Mild AS.  The AS may be underestimated because of the LV dysfunction. Meangradient (S): 14 mm Hg. Peak gradient (S): 25 mm Hg. VTI ratio ofLVOT to aortic valve: 0.37. - Left atrium: The atrium was moderately dilated. - Right ventricle: The cavity size was normal. Systolic functionwas mildly reduced.   Carotid US (10/13): No sig ICA stenosis   Past Medical History  Diagnosis Date  . Hypertension   . Hyperlipoproteinemia   . Aortic stenosis     a. Mod AS/AI by cath 07/2012.;  b.  Echo (07/31/13) is: EF 55-60%, mild aortic stenosis (mean gradient 13);  c. Echo (5/15):  EF 40-45%, mild AS (mean 12 mmHg), mod AI  . Back pain   . Carotid artery occlusion     a. Dopplers 07/2012: no significant high grade obstruction.  . Hyperlipidemia   . Abnormal CT scan, kidney     07/2012 - multiple small cysts  . Cholelithiasis     Seen on CT 07/2012  . Coronary artery disease     a. CABG 10/1991. b. cath 07/2012 with prog dsz, turned down for re-do CABG - for med rx for now.; c. NSTEMI/PCI: DES to mLAD ext into IMA graft;  d. Inf STEMI (5/15):  LM 60-70% then 99% before LAD, pLAD occl, pCFX stent 80+% ISR, oRCA 99% then occl (L-R collats to dRCA), L-LAD ok with patent stent, S-CFX occl (old), S-RCA occl (old); PCI: LM ext into CFX with Xience Alpine DES  . Ischemic  cardiomyopathy     a. 2D ECHO: 08/02/2014; EF 45%; severe hypokinesis base/mid-inferolat segments and severe hypokinesis base inferior segment. Mild LVH, mild AS (may be underestimated due to LV dysfunction).  Mean gradient (S): 14 mm Hg. Peak gradient (S): 25 mm Hg. VTI ratio of LVOT to aortic valve: 0.37. Mod LA dilation. Mild RV systolic dysfxn   . Heart failure systolic    Past Surgical History  Procedure Laterality Date  . Cardiac catheterization  04/03/99  . Carpal tunnel release  2007    Excision mass dorsal left wrist  . Hernia repair  1988  . Coronary artery bypass graft  1993  . Fasciotomy Right 07/30/2013    Procedure: OPEN FASCIOTOMY RIGHT RING FINGER & RIGHT SMALL FINGER MULTIPLE LEVELS;  Surgeon: Wynonia Sours, MD;  Location: [facility];  Service: Orthopedics;  Laterality: Right;  . Left heart catheterization with coronary/graft angiogram N/A 08/10/2012    Procedure: LEFT HEART CATHETERIZATION WITH Beatrix Fetters;  Surgeon: Hillary Bow,  MD;  Location: [facility] CATH LAB;  Service: Cardiovascular;  Laterality: N/A;  . Percutaneous coronary stent intervention (pci-s)  07/31/2013    Procedure: PERCUTANEOUS CORONARY STENT INTERVENTION (PCI-S);  Surgeon: Burnell Blanks, MD;  Location: [facility] CATH LAB;  Service: Cardiovascular;;  LIMA to LAD at the anastomosis with aortic root shot   . Left heart catheterization with coronary angiogram N/A 02/28/2014    Procedure: LEFT HEART CATHETERIZATION WITH CORONARY ANGIOGRAM;  Surgeon: Troy Sine, MD;  Location: [facility] CATH LAB;  Service: Cardiovascular;  Laterality: N/A;  . Left heart catheterization with coronary/graft angiogram N/A 08/01/2014    Procedure: LEFT HEART CATHETERIZATION WITH Beatrix Fetters;  Surgeon: Leonie Man, MD;  Location: [facility] CATH LAB;  Service: Cardiovascular;  Laterality: N/A;  . Left heart catheterization with coronary/graft angiogram N/A 02/12/2015    Procedure: LEFT HEART  CATHETERIZATION WITH Beatrix Fetters;  Surgeon: Burnell Blanks, MD;  Location: [facility] CATH LAB;  Service: Cardiovascular;  Laterality: N/A;  . Cardiac catheterization N/A 03/20/2015    Procedure: Left Heart Cath and Cors/Grafts Angiography;  Surgeon: Jettie Booze, MD;  Location: [facility] CV LAB;  Service: Cardiovascular;  Laterality: N/A;     Current Outpatient Prescriptions  Medication Sig Dispense Refill  . amLODipine (NORVASC) 5 MG tablet Take 1 tablet (5 mg total) by mouth daily. 30 tablet 5  . aspirin EC 81 MG tablet Take 81 mg by mouth at bedtime.    Marland Kitchen atorvastatin (LIPITOR) 80 MG tablet Take 1 tablet (80 mg total) by mouth daily. 30 tablet 11  . bisoprolol (ZEBETA) 5 MG tablet Take 0.5 tablets (2.5 mg total) by mouth 2 (two) times daily. 60 tablet 11  . clopidogrel (PLAVIX) 75 MG tablet TAKE 1 TABLET (75 MG TOTAL) BY MOUTH DAILY WITH BREAKFAST. 30 tablet 11  . famotidine (PEPCID) 20 MG tablet One at bedtime 30 tablet 11  . fexofenadine (ALLEGRA) 180 MG tablet Take 180 mg by mouth daily.     . furosemide (LASIX) 40 MG tablet Take 1 tablet (40 mg total) by mouth 2 (two) times daily. 60 tablet 11  . isosorbide mononitrate (IMDUR) 60 MG 24 hr tablet TAKE 1 AND 1/2 TABLET (90 MG TOTAL) BY MOUTH DAILY. 45 tablet 5  . nitroGLYCERIN (NITROSTAT) 0.4 MG SL tablet TAKE 1 TABLET UNDER THE TONGUE AS NEEDEDFOR CHEST PAIN ( MAY REPEAT EVERY 5 MINUTES X 3) 25 tablet 6  . pantoprazole (PROTONIX) 40 MG tablet Take 1 tablet (40 mg total) by mouth daily. 30 tablet 6  . potassium chloride SA (K-DUR,KLOR-CON) 20 MEQ tablet Take 1 tablet (20 mEq total) by mouth daily. 30 tablet 5  . ranolazine (RANEXA) 500 MG 12 hr tablet Take 1 tablet (500 mg total) by mouth 2 (two) times daily. 60 tablet 11  . spironolactone (ALDACTONE) 25 MG tablet Take 12.5 mg by mouth 2 (two) times daily.  5   No current facility-administered medications for this visit.    Allergies:   Review of patient's  allergies indicates no known allergies.    Social History:  The patient  reports that he has been smoking Cigarettes.  He has a 57 pack-year smoking history. He has never used smokeless tobacco. He reports that he drinks about 3.0 oz of alcohol per week. He reports that he does not use illicit drugs.   Family History:  The patient's family history includes Allergies in his sister; Emphysema in his brother; Heart attack in his mother; Hypertension in his brother, father,  mother, and sister. There is no history of Stroke.    ROS:   Please see the history of present illness.   Review of Systems  All other systems reviewed and are negative.     PHYSICAL EXAM: VS:  BP 120/60 mmHg  Pulse 76  Ht 5\' 5"  (1.651 m)  Wt 146 lb (66.225 kg)  BMI 24.30 kg/m2    Wt Readings from Last 3 Encounters:  05/15/15 146 lb (66.225 kg)  04/24/15 148 lb (67.132 kg)  04/10/15 148 lb 3.2 oz (67.223 kg)     GEN: Well nourished, well developed, in no acute distress HEENT: normal Neck: no JVD,   no masses Cardiac:  Normal S1/S2, RRR; 9-8/3 systolic murmur RUSB,  no rubs or gallops, no edema  Respiratory: decreased breath sounds bilaterally, no wheezing, no rales GI: soft, nontender, nondistended, + BS MS: no deformity or atrophy Skin: warm and dry  Neuro:  CNs II-XII intact, Strength and sensation are intact Psych: Normal affect   EKG:  EKG is not ordered today.  It demonstrates:  n/a    Recent Labs: 03/31/2015: ALT 32 04/08/2015: Hemoglobin 13.1; Platelets 279.0; Pro B Natriuretic peptide (BNP) 543.0* 05/01/2015: BNP 407.2*; BUN 23; Creatinine, Ser 1.48*; Potassium 5.1; Sodium 139    Lipid Panel    Component Value Date/Time   CHOL 146 03/31/2015 0905   CHOL 143 03/01/2014 0129   TRIG 136 03/31/2015 0905   HDL 42 03/31/2015 0905   HDL 64 03/01/2014 0129   CHOLHDL 2.2 03/01/2014 0129   VLDL 12 03/01/2014 0129   LDLCALC 77 03/31/2015 0905   LDLCALC 67 03/01/2014 0129      ASSESSMENT AND  PLAN:  Shortness of breath:  With assoc cough.  Symptoms are better with prednisone, etc Rx'd by Dr. Melvyn Novas.  Unfortunately, he continues to smoke.  FU with pulmonology as planned.   Chronic systolic CHF (congestive heart failure):  Volume stable.  Continue current Rx.   Coronary artery disease involving native coronary artery of native heart with angina pectoris:   Symptoms much improved.  Continue amlodipine, aspirin, beta blocker, Plavix, statin, nitrates, Ranexa.  Ischemic cardiomyopathy:   Continue beta-blocker, nitrates, spironolactone.  Consider adding angiotensin receptor blocker at next FU visit.   Essential hypertension:  Controlled.  Hyperlipidemia:  LDL 6/16 77.  Continue statin.  Tobacco abuse:  He will likely never quit.  Aortic stenosis :  Mod by recent Echo.  Chronic kidney disease:  Recent creatinine stable.       Medication Adjustments: Current medicines are reviewed at length with the patient today.  Concerns regarding medicines are as outlined above.  The following changes have been made:   Modified Medications   No medications on file   Discontinued Medications   No medications on file   New Prescriptions   No medications on file    Labs/ tests ordered today include:   No orders of the defined types were placed in this encounter.     Disposition:   FU Dr. Lauree Chandler 3 mos.       Signed, Versie Starks, MHS 05/15/2015 4:34 PM    [facility] [address], [place], [place]  [address] Phone: [phone]; Fax: [phone]

## 2015-05-15 ENCOUNTER — Ambulatory Visit (INDEPENDENT_AMBULATORY_CARE_PROVIDER_SITE_OTHER): Payer: Medicare Other | Admitting: Physician Assistant

## 2015-05-15 ENCOUNTER — Encounter: Payer: Self-pay | Admitting: Physician Assistant

## 2015-05-15 VITALS — BP 120/60 | HR 76 | Ht 65.0 in | Wt 146.0 lb

## 2015-05-15 DIAGNOSIS — I10 Essential (primary) hypertension: Secondary | ICD-10-CM

## 2015-05-15 DIAGNOSIS — R0602 Shortness of breath: Secondary | ICD-10-CM | POA: Diagnosis not present

## 2015-05-15 DIAGNOSIS — I255 Ischemic cardiomyopathy: Secondary | ICD-10-CM

## 2015-05-15 DIAGNOSIS — E785 Hyperlipidemia, unspecified: Secondary | ICD-10-CM

## 2015-05-15 DIAGNOSIS — N189 Chronic kidney disease, unspecified: Secondary | ICD-10-CM

## 2015-05-15 DIAGNOSIS — I25119 Atherosclerotic heart disease of native coronary artery with unspecified angina pectoris: Secondary | ICD-10-CM

## 2015-05-15 DIAGNOSIS — I5022 Chronic systolic (congestive) heart failure: Secondary | ICD-10-CM | POA: Diagnosis not present

## 2015-05-15 DIAGNOSIS — Z720 Tobacco use: Secondary | ICD-10-CM

## 2015-05-15 DIAGNOSIS — I350 Nonrheumatic aortic (valve) stenosis: Secondary | ICD-10-CM

## 2015-05-15 DIAGNOSIS — R0789 Other chest pain: Secondary | ICD-10-CM | POA: Insufficient documentation

## 2015-05-15 NOTE — Patient Instructions (Signed)
Medication Instructions:  Your physician recommends that you continue on your current medications as directed. Please refer to the Current Medication list given to you today.   Labwork: NONE  Testing/Procedures: NONE  Follow-Up: Dr. Angelena Form in 3 months  Any Other Special Instructions Will Be Listed Below (If Applicable).

## 2015-05-21 ENCOUNTER — Ambulatory Visit (INDEPENDENT_AMBULATORY_CARE_PROVIDER_SITE_OTHER): Payer: Medicare Other | Admitting: Adult Health

## 2015-05-21 ENCOUNTER — Encounter: Payer: Self-pay | Admitting: Adult Health

## 2015-05-21 VITALS — BP 122/76 | HR 69 | Temp 97.7°F | Ht 65.0 in | Wt 146.0 lb

## 2015-05-21 DIAGNOSIS — R05 Cough: Secondary | ICD-10-CM

## 2015-05-21 DIAGNOSIS — Z720 Tobacco use: Secondary | ICD-10-CM

## 2015-05-21 DIAGNOSIS — I5022 Chronic systolic (congestive) heart failure: Secondary | ICD-10-CM

## 2015-05-21 DIAGNOSIS — R0602 Shortness of breath: Secondary | ICD-10-CM | POA: Diagnosis not present

## 2015-05-21 DIAGNOSIS — R058 Other specified cough: Secondary | ICD-10-CM

## 2015-05-21 DIAGNOSIS — I255 Ischemic cardiomyopathy: Secondary | ICD-10-CM

## 2015-05-21 DIAGNOSIS — F17210 Nicotine dependence, cigarettes, uncomplicated: Secondary | ICD-10-CM

## 2015-05-21 NOTE — Patient Instructions (Signed)
May use Delsym 2 tsp Twice daily  As needed  Cough  Sips of water to soothe throat clearing  Remain active as tolerated.  Pulmonary rehab is good program to get stronger, call back if interested.  Follow up Dr. Melvyn Novas  In 4 weeks with PFT

## 2015-05-21 NOTE — Progress Notes (Signed)
Subjective:    Patient ID: Steve Durham, male    DOB: 11-06-1945    MRN: [id number]  HPI  INITIAL PRESENTATION: 32 yowm active smoker with h/o bronchitis but only intermittent and not on any resp rx chronically with progressive doe x one month and one week pta acutely worse sob at rest with cough/ sore throat/ hoarseness while on ACEi admitted with chf by cards and ACEi d/c'd 03/19/15 due to renal issues   but still sob at rest and hacking cough with discolored mucus so PCCM service consulted am 6/4 (see notes) by Dr Marlou Porch  STUDIES admit   Heart Cath Cardiac Studies: Cath 6/216  Severe three-vessel coronary artery disease.  Patent stent from the left main into the circumflex system.  Patent LIMA to LAD. Patent stent in the LAD. Elevated LVEDP of 36 mm Hg    04/10/2015 1st [facility] office visit/ Steve Durham of care from recent admit/ extended ov/  still smoking  Chief Complaint  Patient presents with  . Pulmonary Consult    Referred by Richardson Dopp, PA. Pt c/o SOB "for ages"- worse x 2.5 wks. He states breathing bothers him all of the time, but is esp worse with exertion.  He gets SOB walking approx 20 ft. He also c/o "constant" cough- prod with minimal white sputum. He has noticed CP recently that occurs after long coughing spell.  sleeps in recliner since around 2014 due to smothering and coughing and choking sensation  Am nasal congestion/chest congestion then some better p half hour but then hacking all day long / freq white x tsp or less at most, and mostly just in ams Coughs so hard has diffuse ant cp/ min improvement so far off acei/ moslty sob when coughing / inhalers in past not helpful   >>pred taper and delsym    05/21/2015 Follow up : Cough/SOB, Suspected COPD in heavy smoker , CHF CAD  Pt returns for 6 week follow up .  Seen last ov for pulmonary consult for cough , and dsypnea.  He was given pred taper and started on prilosec/pepcid and delsym for  cough control.  Says he is feeling better, cough got better with steroids.  Has noticed it to be slightly returning with throat clearing and dry cough.  Still smoking 1 PPD , discussed smoking cessation.  Previously on ACE inhibitor, taken off 03/19/15.  Gets winded at times, but remains active with yard work.  Delsym helps cough quite a bit.  No formal PFT in past.  Denies hemotpysis , chest pain, orthopnea, edema or fever.      Current Medications, Allergies, Complete Past Medical History, Past Surgical History, Family History, and Social History were reviewed in [employer] record.        Review of Systems   Constitutional:   No  weight loss, night sweats,  Fevers, chills, fatigue, or  lassitude.  HEENT:   No headaches,  Difficulty swallowing,  Tooth/dental problems, or  Sore throat,                No sneezing, itching, ear ache,  +nasal congestion, post nasal drip,   CV:  No chest pain,  Orthopnea, PND, swelling in lower extremities, anasarca, dizziness, palpitations, syncope.   GI  No heartburn, indigestion, abdominal pain, nausea, vomiting, diarrhea, change in bowel habits, loss of appetite, bloody stools.   Resp:   No change in color of mucus.  No wheezing.  No chest wall deformity  Skin: no rash or lesions.  GU: no dysuria, change in color of urine, no urgency or frequency.  No flank pain, no hematuria   MS:  No joint pain or swelling.  No decreased range of motion.  No back pain.  Psych:  No change in mood or affect. No depression or anxiety.  No memory loss.         Objective:   Physical Exam  amb wm nad but  Mild  throat clearing      Vital signs reviewed   HEENT: nl dentition, turbinates, and orophanx. Nl external ear canals without cough reflex   NECK :  without JVD/Nodes/TM/ nl carotid upstrokes bilaterally   LUNGS: no acc muscle use, CTA , decreased bs in bases  without cough on insp or exp maneuvers   CV:  RRR  no s3  or murmur or increase in P2, no edema   ABD:  soft and nontender with nl excursion in the supine position. No bruits or organomegaly, bowel sounds nl  MS:  warm without deformities, calf tenderness, cyanosis or clubbing  SKIN: warm and dry without lesions    NEURO:  alert, approp, no deficits       CXR:  04/08/15 No active cardiopulmonary disease. No significant change. Status post CABG.       Assessment & Plan:

## 2015-05-21 NOTE — Progress Notes (Signed)
Chart and office note reviewed in detail  > agree with a/p as outlined    

## 2015-05-21 NOTE — Assessment & Plan Note (Addendum)
Appears compensated without fluid overload.  Cont on current regimen  Follow up with cards

## 2015-05-21 NOTE — Assessment & Plan Note (Signed)
Cough is imrpoving with tx aimed at GERD/AR prevention  and cough prevention  Check PFT on return .

## 2015-05-21 NOTE — Assessment & Plan Note (Signed)
Smoking cessation discussed 

## 2015-06-02 ENCOUNTER — Other Ambulatory Visit: Payer: Self-pay | Admitting: Cardiology

## 2015-06-02 NOTE — Telephone Encounter (Signed)
REFILL 

## 2015-06-27 ENCOUNTER — Ambulatory Visit (INDEPENDENT_AMBULATORY_CARE_PROVIDER_SITE_OTHER): Payer: Medicare Other | Admitting: Internal Medicine

## 2015-06-27 ENCOUNTER — Encounter: Payer: Self-pay | Admitting: Internal Medicine

## 2015-06-27 VITALS — BP 116/64 | HR 67 | Ht 66.0 in | Wt 149.0 lb

## 2015-06-27 DIAGNOSIS — R058 Other specified cough: Secondary | ICD-10-CM

## 2015-06-27 DIAGNOSIS — F17210 Nicotine dependence, cigarettes, uncomplicated: Secondary | ICD-10-CM

## 2015-06-27 DIAGNOSIS — Z720 Tobacco use: Secondary | ICD-10-CM

## 2015-06-27 DIAGNOSIS — R05 Cough: Secondary | ICD-10-CM

## 2015-06-27 DIAGNOSIS — I5022 Chronic systolic (congestive) heart failure: Secondary | ICD-10-CM

## 2015-06-27 DIAGNOSIS — J449 Chronic obstructive pulmonary disease, unspecified: Secondary | ICD-10-CM

## 2015-06-27 DIAGNOSIS — R0602 Shortness of breath: Secondary | ICD-10-CM

## 2015-06-27 DIAGNOSIS — I255 Ischemic cardiomyopathy: Secondary | ICD-10-CM

## 2015-06-27 LAB — PULMONARY FUNCTION TEST
DL/VA % pred: 82 %
DL/VA: 3.58 ml/min/mmHg/L
DLCO UNC: 16.97 ml/min/mmHg
DLCO unc % pred: 62 %
FEF 25-75 PRE: 0.88 L/s
FEF 25-75 Post: 1.08 L/sec
FEF2575-%Change-Post: 22 %
FEF2575-%PRED-PRE: 42 %
FEF2575-%Pred-Post: 52 %
FEV1-%Change-Post: 4 %
FEV1-%PRED-POST: 67 %
FEV1-%Pred-Pre: 64 %
FEV1-Post: 1.85 L
FEV1-Pre: 1.76 L
FEV1FVC-%CHANGE-POST: 1 %
FEV1FVC-%Pred-Pre: 90 %
FEV6-%CHANGE-POST: 4 %
FEV6-%Pred-Post: 77 %
FEV6-%Pred-Pre: 74 %
FEV6-POST: 2.7 L
FEV6-Pre: 2.59 L
FEV6FVC-%CHANGE-POST: 0 %
FEV6FVC-%Pred-Post: 104 %
FEV6FVC-%Pred-Pre: 103 %
FVC-%Change-Post: 3 %
FVC-%Pred-Post: 73 %
FVC-%Pred-Pre: 71 %
FVC-Post: 2.75 L
FVC-Pre: 2.66 L
PRE FEV1/FVC RATIO: 66 %
Post FEV1/FVC ratio: 67 %
Post FEV6/FVC ratio: 98 %
Pre FEV6/FVC Ratio: 97 %
RV % pred: 117 %
RV: 2.65 L
TLC % PRED: 87 %
TLC: 5.42 L

## 2015-06-27 NOTE — Progress Notes (Signed)
PFT done today. 

## 2015-06-27 NOTE — Patient Instructions (Addendum)
For drainage take chlortrimeton (chlorpheniramine) 4 mg every 4 hours available over the counter (may cause drowsiness)   GERD (REFLUX)  is an extremely common cause of respiratory symptoms just like yours , many times with no obvious heartburn at all.    It can be treated with medication, but also with lifestyle changes including elevation of the head of your bed (ideally with 6 inch  bed blocks),  Smoking cessation, avoidance of late meals, excessive alcohol, and avoid fatty foods, chocolate, peppermint, colas, red wine, and acidic juices such as orange juice.  NO MINT OR MENTHOL PRODUCTS SO NO COUGH DROPS  USE SUGARLESS CANDY INSTEAD (Jolley ranchers or Stover's or Life Savers) or even ice chips will also do - the key is to swallow to prevent all throat clearing. NO OIL BASED VITAMINS - use powdered substitutes.   The key is to stop smoking completely before smoking completely stops you - it is not too late as you only have very mild copd   Pulmonary follow up is as needed

## 2015-06-27 NOTE — Progress Notes (Signed)
Subjective:    Patient ID: Steve Durham, male    DOB: 06-03-1946    MRN: [id number]    Brief patient profile:  INITIAL PRESENTATION: 78 yowm active smoker with h/o bronchitis but only intermittent and not on any resp rx chronically with progressive doe x one month and one week pta acutely worse sob at rest with cough/ sore throat/ hoarseness while on ACEi admitted with chf by cards and ACEi d/c'd 03/19/15 due to renal issues   but still sob at rest and hacking cough with discolored mucus so PCCM service consulted am 6/4 (see notes) by Dr Marlou Porch  STUDIES admit   Heart Cath Cardiac Studies: Cath 03/20/15  Severe three-vessel coronary artery disease.  Patent stent from the left main into the circumflex system.  Patent LIMA to LAD. Patent stent in the LAD. Elevated LVEDP of 36 mm Hg      History of Present Illness  04/10/2015 1st [facility] office visit/ Steve Durham of care from recent admit/ extended ov/  still smoking  Chief Complaint  Patient presents with  . Pulmonary Consult    Referred by Richardson Dopp, PA. Pt c/o SOB "for ages"- worse x 2.5 wks. He states breathing bothers him all of the time, but is esp worse with exertion.  He gets SOB walking approx 20 ft. He also c/o "constant" cough- prod with minimal white sputum. He has noticed CP recently that occurs after long coughing spell.  sleeps in recliner since around 2014 due to smothering and coughing and choking sensation  Am nasal congestion/chest congestion then some better p half hour but then hacking all day long / freq white x tsp or less at most, and mostly just in ams Coughs so hard has diffuse ant cp/ min improvement so far off acei/ moslty sob when coughing / inhalers in past not helpful   >>pred taper and delsym    05/21/2015 Follow up : Cough/SOB, Suspected COPD in heavy smoker , CHF CAD  Pt returns for 6 week follow up .  Seen last ov for pulmonary consult for cough , and dsypnea.  He was given  pred taper and started on prilosec/pepcid and delsym for cough control.  Says he is feeling better, cough got better with steroids.  Has noticed it to be slightly returning with throat clearing and dry cough.  Still smoking 1 PPD , discussed smoking cessation.  Previously on ACE inhibitor, taken off 03/19/15.  Gets winded at times, but remains active with yard work.  Delsym helps cough quite a bit.  No formal PFT in past.  rec May use Delsym 2 tsp Twice daily  As needed  Cough  Sips of water to soothe throat clearing  Remain active as tolerated.    06/27/2015 f/u ov/Steve Durham re:  Chief Complaint  Patient presents with  . Follow-up    PFT done today.  Pt states that his breathing is some better and not coughing as much.       really Not limited by breathing from desired activities   - main issue is noct "choking"   No obvious day to day or daytime variability or assoc excess / purulent sputum  or cp or chest tightness, subjective wheeze or overt sinus or hb symptoms. No unusual exp hx or h/o childhood pna/ asthma or knowledge of premature birth.  S . Also denies any obvious fluctuation of symptoms with weather or environmental changes or other aggravating or alleviating factors except as outlined above  Current Medications, Allergies, Complete Past Medical History, Past Surgical History, Family History, and Social History were reviewed in [employer] record.  ROS  The following are not active complaints unless bolded sore throat, dysphagia, dental problems, itching, sneezing,  nasal congestion or excess/ purulent secretions, ear ache,   fever, chills, sweats, unintended wt loss, classically pleuritic or exertional cp, hemoptysis,  orthopnea pnd or leg swelling, presyncope, palpitations, abdominal pain, anorexia, nausea, vomiting, diarrhea  or change in bowel or bladder habits, change in stools or urine, dysuria,hematuria,  rash, arthralgias, visual complaints, headache,  numbness, weakness or ataxia or problems with walking or coordination,  change in mood/affect or memory.                        Objective:   Physical Exam  amb wm nad but  Vigorous harsh throat clearing      Wt Readings from Last 3 Encounters:  06/27/15 149 lb (67.586 kg)  05/21/15 146 lb (66.225 kg)  05/15/15 146 lb (66.225 kg)    Vital signs reviewed    HEENT: nl dentition, turbinates, and orophanx. Nl external ear canals without cough reflex   NECK :  without JVD/Nodes/TM/ nl carotid upstrokes bilaterally   LUNGS: no acc muscle use, CTA , decreased bs in bases  without cough on insp or exp maneuvers   CV:  RRR  no s3 or murmur or increase in P2, no edema   ABD:  soft and nontender with nl excursion in the supine position. No bruits or organomegaly, bowel sounds nl  MS:  warm without deformities, calf tenderness, cyanosis or clubbing  SKIN: warm and dry without lesions    NEURO:  alert, approp, no deficits       CXR:  04/08/15 No active cardiopulmonary disease. No significant change. Status post CABG.       Assessment & Plan:

## 2015-06-28 ENCOUNTER — Encounter: Payer: Self-pay | Admitting: Internal Medicine

## 2015-06-28 NOTE — Assessment & Plan Note (Addendum)
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    Please see instructions for details which were reviewed in writing and the patient given a copy highlighting the part that I personally wrote and discussed at today's ov.

## 2015-06-28 NOTE — Assessment & Plan Note (Signed)
-   04/11/2015  Walked RA x 3 laps @ 185 ft each stopped due to  End of study, no desats or sob , rapid pace  - PFT's  06/28/2015  FEV1 1.85 (67 % ) ratio 67  p 4 % improvement from saba with DLCO  62 % corrects to 82 % for alv volume    As I explained to this patient in detail:  although there may be copd present, it may not be clinically relevant:   it does not appear to be limiting activity tolerance any more than a set of worn tires limits someone from driving a car  around a parking lot.  A new set of Michelins might look good but would have no perceived impact on the performance of the car and would not be worth the cost, nor would it improve his problem = uacs (see sep a/p) in fact, most inhalers make this problem worse   .

## 2015-06-28 NOTE — Assessment & Plan Note (Signed)
   Heart Cath Cardiac Studies: Cath 6//16  Severe three-vessel coronary artery disease.  Patent stent from the left main into the circumflex system.  Patent LIMA to LAD. Patent stent in the LAD. Elevated LVEDP of 36 mm Hg   Note still sleeping in recliner - not clear to me whether some of his noct "choking" might be due to chf/ defer to Cards

## 2015-06-28 NOTE — Assessment & Plan Note (Signed)

## 2015-07-07 ENCOUNTER — Telehealth: Payer: Self-pay | Admitting: *Deleted

## 2015-07-07 DIAGNOSIS — I5023 Acute on chronic systolic (congestive) heart failure: Secondary | ICD-10-CM

## 2015-07-07 NOTE — Telephone Encounter (Signed)
Voice mail not set up yet. I will try again later to reach pt to advise per Brynda Rim. PA that he needs pt to come in this week for bmet, bnp due to Dr. Melvyn Novas note pt still having nocturnal choking.

## 2015-07-08 NOTE — Progress Notes (Signed)
VM not set up on phone. I will try to reach pt later to schedule for lab work per [place], BMET, BNP.

## 2015-07-08 NOTE — Telephone Encounter (Signed)
VM not set up on phone. I will try to reach pt later to schedule for lab work per [place], BMET, BNP.

## 2015-07-18 DIAGNOSIS — L578 Other skin changes due to chronic exposure to nonionizing radiation: Secondary | ICD-10-CM | POA: Diagnosis not present

## 2015-07-18 DIAGNOSIS — L570 Actinic keratosis: Secondary | ICD-10-CM | POA: Diagnosis not present

## 2015-07-18 DIAGNOSIS — Z23 Encounter for immunization: Secondary | ICD-10-CM | POA: Diagnosis not present

## 2015-07-18 DIAGNOSIS — L821 Other seborrheic keratosis: Secondary | ICD-10-CM | POA: Diagnosis not present

## 2015-08-07 ENCOUNTER — Telehealth: Payer: Self-pay | Admitting: Physician Assistant

## 2015-08-07 ENCOUNTER — Telehealth: Payer: Self-pay | Admitting: Cardiovascular Disease

## 2015-08-07 NOTE — Telephone Encounter (Signed)
Request for surgical clearance:  1. What type of surgery is being performed? Oral surgery  2. When is this surgery scheduled? pending   3. Are there any medications that need to be held prior to surgery and how long?no   4. Name of physician performing surgery? Dr Johny Shears   5. What is your office phone and fax number? Phone [phone] and fax [phone]  Need to know how to manage pt during the surgery.

## 2015-08-07 NOTE — Telephone Encounter (Signed)
Spoke with [employer]. Pt needs to have 2 teeth removed.  Does not need to stop ASA or Plavix.  Oral surgeon is requesting cardiac clearance.  Also asking if procedure can be done in office with IV sedation or if pt should be hospitalized for procedure.  Procedure is not scheduled yet and is not urgent.  Pt is scheduled to see Dr. Angelena Form on 09/08/15 for 3 month follow up.  Will forward to Dr. Angelena Form for recommedations

## 2015-08-07 NOTE — Telephone Encounter (Signed)
Steve Durham would be high risk for this procedure in the outpatient setting with IV sedation. I would recommend that they consider inpatient approach. He does not need cardiac testing before the procedure. He has severe CAD and has been stable recently on ASA and Plavix. I would not stop ASA and Plavix for the procedure. cdm

## 2015-08-07 NOTE — Telephone Encounter (Signed)
New Message       Pt calling stating that he usually gets his labs done at an office in [place]. Pt would like for Arbie Cookey to mail him the order for labs so he can take it with him to get them done. Please call back and advise.

## 2015-08-07 NOTE — Telephone Encounter (Signed)
Message left on office voicemail that I would fax this note to office.  Left message to call back if questions.

## 2015-08-18 ENCOUNTER — Other Ambulatory Visit (INDEPENDENT_AMBULATORY_CARE_PROVIDER_SITE_OTHER): Payer: Medicare Other | Admitting: *Deleted

## 2015-08-18 DIAGNOSIS — I25119 Atherosclerotic heart disease of native coronary artery with unspecified angina pectoris: Secondary | ICD-10-CM

## 2015-08-18 DIAGNOSIS — I209 Angina pectoris, unspecified: Secondary | ICD-10-CM | POA: Diagnosis not present

## 2015-08-18 DIAGNOSIS — I2510 Atherosclerotic heart disease of native coronary artery without angina pectoris: Secondary | ICD-10-CM | POA: Diagnosis not present

## 2015-08-18 DIAGNOSIS — E785 Hyperlipidemia, unspecified: Secondary | ICD-10-CM

## 2015-08-18 LAB — LIPID PANEL
CHOLESTEROL: 137 mg/dL (ref 125–200)
HDL: 42 mg/dL (ref >=40)
LDL CALC: 69 mg/dL (ref <130)
Total CHOL/HDL Ratio: 3.3 Ratio (ref <=5.0)
Triglycerides: 128 mg/dL (ref <150)
VLDL: 26 mg/dL (ref <30)

## 2015-08-18 LAB — HEPATIC FUNCTION PANEL
ALBUMIN: 3.8 g/dL (ref 3.6–5.1)
ALK PHOS: 63 U/L (ref 40–115)
ALT: 12 U/L (ref 9–46)
AST: 19 U/L (ref 10–35)
BILIRUBIN INDIRECT: 0.8 mg/dL (ref 0.2–1.2)
Bilirubin, Direct: 0.2 mg/dL (ref <=0.2)
Total Bilirubin: 1 mg/dL (ref 0.2–1.2)
Total Protein: 6.6 g/dL (ref 6.1–8.1)

## 2015-08-18 NOTE — Addendum Note (Signed)
Addended by: Eulis Foster on: 08/18/2015 10:55 AM   Modules accepted: Orders

## 2015-08-18 NOTE — Addendum Note (Signed)
Addended by: Eulis Foster on: 08/18/2015 10:56 AM   Modules accepted: Orders

## 2015-08-19 ENCOUNTER — Other Ambulatory Visit: Payer: Self-pay | Admitting: Cardiovascular Disease

## 2015-08-20 ENCOUNTER — Telehealth: Payer: Self-pay | Admitting: *Deleted

## 2015-08-20 ENCOUNTER — Ambulatory Visit (INDEPENDENT_AMBULATORY_CARE_PROVIDER_SITE_OTHER): Payer: Medicare Other | Admitting: Cardiovascular Disease

## 2015-08-20 ENCOUNTER — Encounter: Payer: Self-pay | Admitting: Cardiovascular Disease

## 2015-08-20 VITALS — BP 122/68 | HR 64 | Ht 66.0 in | Wt 151.0 lb

## 2015-08-20 DIAGNOSIS — I10 Essential (primary) hypertension: Secondary | ICD-10-CM

## 2015-08-20 DIAGNOSIS — I5022 Chronic systolic (congestive) heart failure: Secondary | ICD-10-CM | POA: Diagnosis not present

## 2015-08-20 DIAGNOSIS — I350 Nonrheumatic aortic (valve) stenosis: Secondary | ICD-10-CM

## 2015-08-20 DIAGNOSIS — Z720 Tobacco use: Secondary | ICD-10-CM

## 2015-08-20 DIAGNOSIS — I25810 Atherosclerosis of coronary artery bypass graft(s) without angina pectoris: Secondary | ICD-10-CM

## 2015-08-20 DIAGNOSIS — I255 Ischemic cardiomyopathy: Secondary | ICD-10-CM | POA: Diagnosis not present

## 2015-08-20 DIAGNOSIS — E785 Hyperlipidemia, unspecified: Secondary | ICD-10-CM

## 2015-08-20 DIAGNOSIS — Z01810 Encounter for preprocedural cardiovascular examination: Secondary | ICD-10-CM

## 2015-08-20 NOTE — Telephone Encounter (Signed)
Thanks. cdm 

## 2015-08-20 NOTE — Patient Instructions (Signed)

## 2015-08-20 NOTE — Telephone Encounter (Signed)
Received call from Dr. Johny Shears. She has received office note from Dr. Angelena Form and is calling to let us know they are going to do dental extraction with local anesthesia and moderate conscious sedation.  They are not planning to stop Plavix.

## 2015-08-20 NOTE — Progress Notes (Signed)
Chief Complaint  Patient presents with  . Follow-up    History of Present Illness: 69 y.o. Male with history of CAD s/p CABG in 1993, HTN, HLD, aortic stenosis here today for follow up. Admitted October 2013 and cath performed with occlusion of LAD, patent IMA to LAD, occluded proximal RCA with occluded SVG to PDA, severe disease in left main into Circumflex with occluded vein graft to Circumflex. Re-do CABG suggested but his aorta is severely calcified. He was seen by Dr. Servando Snare with CT surgery and not felt to be a candidate for re-do bypass. Imdur was added. He was admitted 10/14-10/18/14 with a non-STEMI. LHC (07/31/13): mLM 60 then 70, pLAD occluded, mLAD 99 after anastomosis of the graft, pCFX 50, OM stent with 70% ISR, pRCA occluded, SVG-PDA occluded, SVG-OM occluded, LIMA-LAD patent. PCI: Promus premier (2.25 x 20 mm) DES to the mid LAD extending back into the IMA graft. Echo (07/31/13) is: EF 55-60%, mild aortic stenosis (mean gradient 13), mild LAE. Readmitted 02/28/14 with inferolateral STEMI and found to have progression of disease in the left main/proximal Circumflex stent. A 3.0 x 28 mm Xience DES was placed from the Circumflex back into the left main. (The vein graft to the OM is known to be occluded). He did well following the procedure. LVEF 40-45% by echo 03/01/14 with moderate AI, mild AS. Admitted 08/01/14 with chest pain. Cardiac cath per Dr. Ellyn Hack with stenosis left main stent and OM 1. 3.0 x 23 mm Xience DES placed OM1. The left main stent restenosis treated with Latimer balloon. He was treated with IV lasix for volume overload and discharged home on Lasix and aldactone. I saw him April 2016 and he c/o chest pain. Cardiac cath 01/2015 with stable disease. He was admitted to [facility] May 2016 with CHF and chest pain in the setting of probable bronchitis. He had new EKG changes and Troponin peaked at 1.38.Cardiac cath June 2016 with stable anatomy. LVEDP was 36 and volume overload was likely  contributing to his shortness of breath and cough. He was placed on Lasix, antibiotics and prednisone taper. He was also evaluated by pulmonology. Echocardiogram demonstrated EF 45-50% with inferoseptal and inferior hypokinesis, moderate aortic stenosis with mean gradient 18 mmHg, biatrial enlargement, mildly reduced RVSF. Of note, his ACE inhibitor was stopped due to worsening creatinine. He is followed by Dr. Melvyn Novas with pulmonology and was recently told that his COPD was not severe (Per Dr. Melvyn Novas: "it does not appear to be limiting activity tolerance any more than a set of worn tires limits someone from driving a car around a parking lot. A new set of Michelins might look good but would have no perceived impact on the performance of the car and would not be worth the cost, nor would it improve his problem"). He was placed on prednisone as well as PPI therapy by Dr. Melvyn Novas. He has been seen in our office July 2016 by Richardson Dopp, PA-C and was started on Ranexa.   He is here today for follow up. No chest pain. Breathing is stable. Still smoking and states that it is the only thing he enjoys in life and will not stop. He has a tooth abscess and needs   Primary Care Physician: None   Past Medical History  Diagnosis Date  . Hypertension   . Hyperlipoproteinemia   . Aortic stenosis     a. Mod AS/AI by cath 07/2012.;  b.  Echo (07/31/13) is: EF 55-60%, mild aortic stenosis (mean  gradient 13);  c. Echo (5/15):  EF 40-45%, mild AS (mean 12 mmHg), mod AI  . Back pain   . Carotid artery occlusion     a. Dopplers 07/2012: no significant high grade obstruction.  . Hyperlipidemia   . Abnormal CT scan, kidney     07/2012 - multiple small cysts  . Cholelithiasis     Seen on CT 07/2012  . Coronary artery disease     a. CABG 10/1991. b. cath 07/2012 with prog dsz, turned down for re-do CABG - for med rx for now.; c. NSTEMI/PCI: DES to mLAD ext into IMA graft;  d. Inf STEMI (5/15):  LM 60-70% then 99% before LAD,  pLAD occl, pCFX stent 80+% ISR, oRCA 99% then occl (L-R collats to dRCA), L-LAD ok with patent stent, S-CFX occl (old), S-RCA occl (old); PCI: LM ext into CFX with Xience Alpine DES  . Ischemic cardiomyopathy     a. 2D ECHO: 08/02/2014; EF 45%; severe hypokinesis base/mid-inferolat segments and severe hypokinesis base inferior segment. Mild LVH, mild AS (may be underestimated due to LV dysfunction).  Mean gradient (S): 14 mm Hg. Peak gradient (S): 25 mm Hg. VTI ratio of LVOT to aortic valve: 0.37. Mod LA dilation. Mild RV systolic dysfxn   . Heart failure (HCC) systolic    Past Surgical History  Procedure Laterality Date  . Cardiac catheterization  04/03/99  . Carpal tunnel release  2007    Excision mass dorsal left wrist  . Hernia repair  1988  . Coronary artery bypass graft  1993  . Fasciotomy Right 07/30/2013    Procedure: OPEN FASCIOTOMY RIGHT RING FINGER & RIGHT SMALL FINGER MULTIPLE LEVELS;  Surgeon: Wynonia Sours, MD;  Location: [facility];  Service: Orthopedics;  Laterality: Right;  . Left heart catheterization with coronary/graft angiogram N/A 08/10/2012    Procedure: LEFT HEART CATHETERIZATION WITH Beatrix Fetters;  Surgeon: Hillary Bow, MD;  Location: [facility] CATH LAB;  Service: Cardiovascular;  Laterality: N/A;  . Percutaneous coronary stent intervention (pci-s)  07/31/2013    Procedure: PERCUTANEOUS CORONARY STENT INTERVENTION (PCI-S);  Surgeon: Burnell Blanks, MD;  Location: [facility] CATH LAB;  Service: Cardiovascular;;  LIMA to LAD at the anastomosis with aortic root shot   . Left heart catheterization with coronary angiogram N/A 02/28/2014    Procedure: LEFT HEART CATHETERIZATION WITH CORONARY ANGIOGRAM;  Surgeon: Troy Sine, MD;  Location: [facility] CATH LAB;  Service: Cardiovascular;  Laterality: N/A;  . Left heart catheterization with coronary/graft angiogram N/A 08/01/2014    Procedure: LEFT HEART CATHETERIZATION WITH Beatrix Fetters;  Surgeon:  Leonie Man, MD;  Location: [facility] CATH LAB;  Service: Cardiovascular;  Laterality: N/A;  . Left heart catheterization with coronary/graft angiogram N/A 02/12/2015    Procedure: LEFT HEART CATHETERIZATION WITH Beatrix Fetters;  Surgeon: Burnell Blanks, MD;  Location: [facility] CATH LAB;  Service: Cardiovascular;  Laterality: N/A;  . Cardiac catheterization N/A 03/20/2015    Procedure: Left Heart Cath and Cors/Grafts Angiography;  Surgeon: Jettie Booze, MD;  Location: [facility] CV LAB;  Service: Cardiovascular;  Laterality: N/A;    Current Outpatient Prescriptions  Medication Sig Dispense Refill  . amLODipine (NORVASC) 5 MG tablet Take 1 tablet (5 mg total) by mouth daily. 30 tablet 5  . aspirin EC 81 MG tablet Take 81 mg by mouth at bedtime.    Marland Kitchen atorvastatin (LIPITOR) 80 MG tablet Take 1 tablet (80 mg total) by mouth daily. 30 tablet 11  .  bisoprolol (ZEBETA) 5 MG tablet Take 0.5 tablets (2.5 mg total) by mouth 2 (two) times daily. 60 tablet 11  . clopidogrel (PLAVIX) 75 MG tablet TAKE 1 TABLET (75 MG TOTAL) BY MOUTH DAILY WITH BREAKFAST. 30 tablet 11  . famotidine (PEPCID) 20 MG tablet One at bedtime 30 tablet 11  . fexofenadine (ALLEGRA) 180 MG tablet Take 180 mg by mouth daily.     . furosemide (LASIX) 40 MG tablet Take 1 tablet (40 mg total) by mouth 2 (two) times daily. 60 tablet 11  . nitroGLYCERIN (NITROSTAT) 0.4 MG SL tablet TAKE 1 TABLET UNDER THE TONGUE AS NEEDEDFOR CHEST PAIN ( MAY REPEAT EVERY 5 MINUTES X 3) 25 tablet 6  . pantoprazole (PROTONIX) 40 MG tablet Take 1 tablet (40 mg total) by mouth daily. 30 tablet 6  . potassium chloride SA (K-DUR,KLOR-CON) 20 MEQ tablet Take 1 tablet (87mEq) by mouth daily. 30 tablet 2  . ranolazine (RANEXA) 500 MG 12 hr tablet Take 1 tablet (500 mg total) by mouth 2 (two) times daily. 60 tablet 11  . spironolactone (ALDACTONE) 25 MG tablet Take 12.5 mg by mouth 2 (two) times daily.  5  . isosorbide mononitrate (IMDUR) 60 MG 24 hr  tablet TAKE 1 AND 1/2 TABLET (90 MG TOTAL) BY MOUTH DAILY. 45 tablet 11   No current facility-administered medications for this visit.    No Known Allergies  Social History   Social History  . Marital Status: Married    Spouse Name: N/A  . Number of Children: 1  . Years of Education: N/A   Occupational History  . Plumbing     Retired in 2007   Social History Main Topics  . Smoking status: Current Every Day Smoker -- 1.00 packs/day for 57 years    Types: Cigarettes  . Smokeless tobacco: Never Used  . Alcohol Use: 3.0 oz/week    5 Cans of beer per week     Comment: daily-6 daily  . Drug Use: No  . Sexual Activity: Not on file   Other Topics Concern  . Not on file   Social History Narrative   Lives in [place] with his family.  Does not routinely exercise.    Family History  Problem Relation Age of Onset  . Heart attack Mother     died @ 8.  Marland Kitchen Hypertension Sister   . Emphysema Brother   . Stroke Neg Hx   . Hypertension Mother   . Hypertension Father   . Hypertension Brother   . Allergies Sister     Review of Systems:  As stated in the HPI and otherwise negative.   BP 122/68 mmHg  Pulse 64  Ht 5\' 6"  (1.676 m)  Wt 151 lb (68.493 kg)  BMI 24.38 kg/m2  Physical Examination: General: Well developed, well nourished, NAD HEENT: OP clear, mucus membranes moist SKIN: warm, dry. No rashes. Neuro: No focal deficits Musculoskeletal: Muscle strength 5/5 all ext Psychiatric: Mood and affect normal Neck: No JVD, no carotid bruits, no thyromegaly, no lymphadenopathy. Lungs:Clear bilaterally, no wheezes, rhonci, crackles Cardiovascular: Regular rate and rhythm. Systolic murmur noted. No gallops or rubs. Abdomen:Soft. Bowel sounds present. Non-tender.  Extremities: No lower extremity edema. Pulses are 1 + in the bilateral DP/PT.  Cardiac cath April 2016:  Left main: Long segment with stent noted throughout the vessel. The stented segment is patent with mild diffuse  20% restenosis.  Left Anterior Descending Artery: Large caliber vessel that courses to the apex. There is  100% ostial occlusion. The mid and distal vessel fills from the patent IMA graft.  Circumflex Artery: Large caliber vessel with stent extending from the ostium into the OM1. There is diffuse 30% restenosis in the stented segment. The mid and distal segment of the has mild diffuse disease. The AV groove Circumflex is occluded and fills from left to left collaterals.  Right Coronary Artery: Known to be occluded at the ostium. (not engaged). The distal vessel fills from left to right collaterals supplied by the Circumflex and the LAD.  Graft Anatomy:  SVG to PDA is known to be occluded and is not selectively engaged.  SVG to OM is known to be occluded and is not selectively engaged.  LIMA to LAD is patent. The stent in the mid LAD beyond the graft insertion is patent with minimal restenosis.  Left Ventricular Angiogram: LVEF=35-40%. Global hypokinesis with more severe hypokinesis in the inferior wall.   Echo 03/21/15: Left ventricle: Severe hypokinesis of base inferoseptal segment and base/mid inferior segments. The cavity size was mildly dilated. Wall thickness was normal. Systolic function was mildly reduced. The estimated ejection fraction was in the range of 45% to 50%. - Aortic valve: There was moderate stenosis. There was mild regurgitation. Mean gradient (S): 18 mm Hg. Peak gradient (S): 33 mm Hg. - Left atrium: The atrium was moderately dilated. - Right ventricle: The cavity size was normal. Systolic function was mildly reduced. - Right atrium: The atrium was mildly dilated  EKG:  EKG is not ordered today. The ekg ordered today demonstrates   Recent Labs: 04/08/2015: Hemoglobin 13.1; Platelets 279.0; Pro B Natriuretic peptide (BNP) 543.0* 05/01/2015: BNP 407.2*; BUN 23; Creatinine, Ser 1.48*; Potassium 5.1; Sodium 139 08/18/2015: ALT 12   Lipid Panel      Component Value Date/Time   CHOL 137 08/18/2015 1056   CHOL 146 03/31/2015 0905   TRIG 128 08/18/2015 1056   HDL 42 08/18/2015 1056   HDL 42 03/31/2015 0905   CHOLHDL 3.3 08/18/2015 1056   VLDL 26 08/18/2015 1056   LDLCALC 69 08/18/2015 1056   LDLCALC 77 03/31/2015 0905     Wt Readings from Last 3 Encounters:  08/20/15 151 lb (68.493 kg)  06/27/15 149 lb (67.586 kg)  05/21/15 146 lb (66.225 kg)     Other studies Reviewed: Additional studies/ records that were reviewed today include:  Review of the above records demonstrates:    Assessment and Plan:   1. CAD with stable angina: He is s/p CABG 1993 and most recent cath June 2016 with stable anatomy with patent IMA to LAD, occlusion of both vein grafts to the PDA and Circumflex, patent left main to Circumflex stent, left to right collaterals filling RCA. He is compliant with his medications but refuses to stop smoking which is contributing to progression of CAD. He understands this. Continue current meds. He is now asking to proceed with dental extraction (see below).  2. CAROTID ARTERY DISEASE: The patient had Dopplers done in the fall of 2013 and does not have significant carotid disease   3. Hyperlipidemia: Continue statin. Lipids controlled.   4. Aortic valve disease: Moderate AS by echo June 2016. Will follow.    5. HTN: BP controlled. No changes.   6. Tobacco abuse: Smoking cessation encouraged. He does not wish to stop  7. Ischemic cardiomyopathy/Chronic systolic CHF: Continue meds including Lasix and aldactone, beta blocker. Volume status is ok. Last LvEF=45-50% by echo June 2016.  8. Pre-operative cardiovascular risk assessment: He has  upcoming dental extraction. While not optimal to stop his Plavix therapy for an elective procedure, he is now more than 12 months out from last coronary intervention. He has recently held his Plavix due to bruising (without notifying our office). He can hold Plavix for 5 days before  dental procedure and resume when safe from bleeding standpoint following the procedure. He is at high cardiac risk for anesthesia but conscious sedation should be ok.    Current medicines are reviewed at length with the patient today.  The patient does not have concerns regarding medicines.  The following changes have been made:  Lasix dose reduced to 40 mg daily  Labs/ tests ordered today include:  No orders of the defined types were placed in this encounter.    Disposition:   FU with me in 6 months  Signed, Lauree Chandler, MD 08/20/2015 11:28 AM    [facility] [address], [place], [place]  [address] Phone: [phone]; Fax: [phone]

## 2015-08-25 NOTE — Telephone Encounter (Signed)
I had been to catch up w/pt about past due lab work (bmet) needed. Pt has recently seen by Dr. Angelena Form; though no lab work ordered at [facility]. I will d/w PA if lab still needed that pt did not show for. If so I will send lab order to pt in the mail.

## 2015-08-26 NOTE — Telephone Encounter (Signed)
S/w pt that I will put lab order Rx in the mail today for BMET to be done in  with the results to be faxed to [place], [place]. Pt agreeable to plan of care. Verified pt address.

## 2015-08-26 NOTE — Telephone Encounter (Signed)
Ok to get FU BMET (Dx Chronic Systolic HF) Fax results to me Richardson Dopp, PA-C   08/26/2015 9:45 AM

## 2015-08-27 NOTE — Progress Notes (Signed)
Rx mailed to pt yesterday .

## 2015-09-01 ENCOUNTER — Other Ambulatory Visit: Payer: Self-pay | Admitting: Physician Assistant

## 2015-09-01 DIAGNOSIS — I5022 Chronic systolic (congestive) heart failure: Secondary | ICD-10-CM | POA: Diagnosis not present

## 2015-09-01 LAB — BASIC METABOLIC PANEL
BUN / CREAT RATIO: 18 (ref 10–22)
BUN: 25 mg/dL (ref 8–27)
CALCIUM: 10 mg/dL (ref 8.6–10.2)
CO2: 26 mmol/L (ref 18–29)
CREATININE: 1.41 mg/dL — AB (ref 0.76–1.27)
Chloride: 100 mmol/L (ref 97–106)
GFR calc Af Amer: 58 mL/min/{1.73_m2} — ABNORMAL LOW (ref >59)
GFR calc non Af Amer: 50 mL/min/{1.73_m2} — ABNORMAL LOW (ref >59)
GLUCOSE: 108 mg/dL — AB (ref 65–99)
Potassium: 4.4 mmol/L (ref 3.5–5.2)
Sodium: 141 mmol/L (ref 136–144)

## 2015-09-02 ENCOUNTER — Telehealth: Payer: Self-pay | Admitting: *Deleted

## 2015-09-02 NOTE — Telephone Encounter (Signed)
Patient was returning call to get his lab info.  Gave info from [facility]

## 2015-09-18 ENCOUNTER — Other Ambulatory Visit: Payer: Self-pay | Admitting: Cardiology

## 2015-09-18 ENCOUNTER — Other Ambulatory Visit: Payer: Self-pay | Admitting: Cardiovascular Disease

## 2015-09-18 NOTE — Telephone Encounter (Signed)
REFILL 

## 2015-10-03 ENCOUNTER — Other Ambulatory Visit: Payer: Self-pay | Admitting: *Deleted

## 2015-10-03 MED ORDER — NITROGLYCERIN 0.4 MG SL SUBL: SUBLINGUAL_TABLET | SUBLINGUAL | Status: DC

## 2015-10-22 ENCOUNTER — Other Ambulatory Visit: Payer: Self-pay | Admitting: *Deleted

## 2015-10-28 ENCOUNTER — Other Ambulatory Visit: Payer: Self-pay | Admitting: Cardiovascular Disease

## 2015-10-28 MED ORDER — NITROGLYCERIN 0.4 MG SL SUBL: SUBLINGUAL_TABLET | SUBLINGUAL | Status: DC

## 2015-10-28 NOTE — Telephone Encounter (Signed)
°*  STAT* If patient is at the pharmacy, call can be transferred to refill team.   1. Which medications need to be refilled? (please list name of each medication and dose if known) Nitrostat 0.4  2. Which pharmacy/location (including street and city if local pharmacy) is medication to be sent to?[facility]  3. Do they need a 30 day or 90 day supply? 30   Pharmacy stated they have sent 4 request and need call back ASAP.

## 2015-12-04 DIAGNOSIS — R0602 Shortness of breath: Secondary | ICD-10-CM | POA: Diagnosis not present

## 2015-12-04 DIAGNOSIS — I509 Heart failure, unspecified: Secondary | ICD-10-CM | POA: Diagnosis not present

## 2015-12-04 DIAGNOSIS — R05 Cough: Secondary | ICD-10-CM | POA: Diagnosis not present

## 2015-12-15 DIAGNOSIS — J441 Chronic obstructive pulmonary disease with (acute) exacerbation: Secondary | ICD-10-CM | POA: Diagnosis not present

## 2015-12-15 DIAGNOSIS — J209 Acute bronchitis, unspecified: Secondary | ICD-10-CM | POA: Diagnosis not present

## 2016-01-21 IMAGING — CR DG CHEST 2V
2 series · 2 of 2 positions shown · non-contrast
Comparison: 03/20/2015 and 03/18/2015.

CLINICAL DATA: Cough, shortness of Breath

EXAM:
CHEST  2 VIEW

[w chest pa]
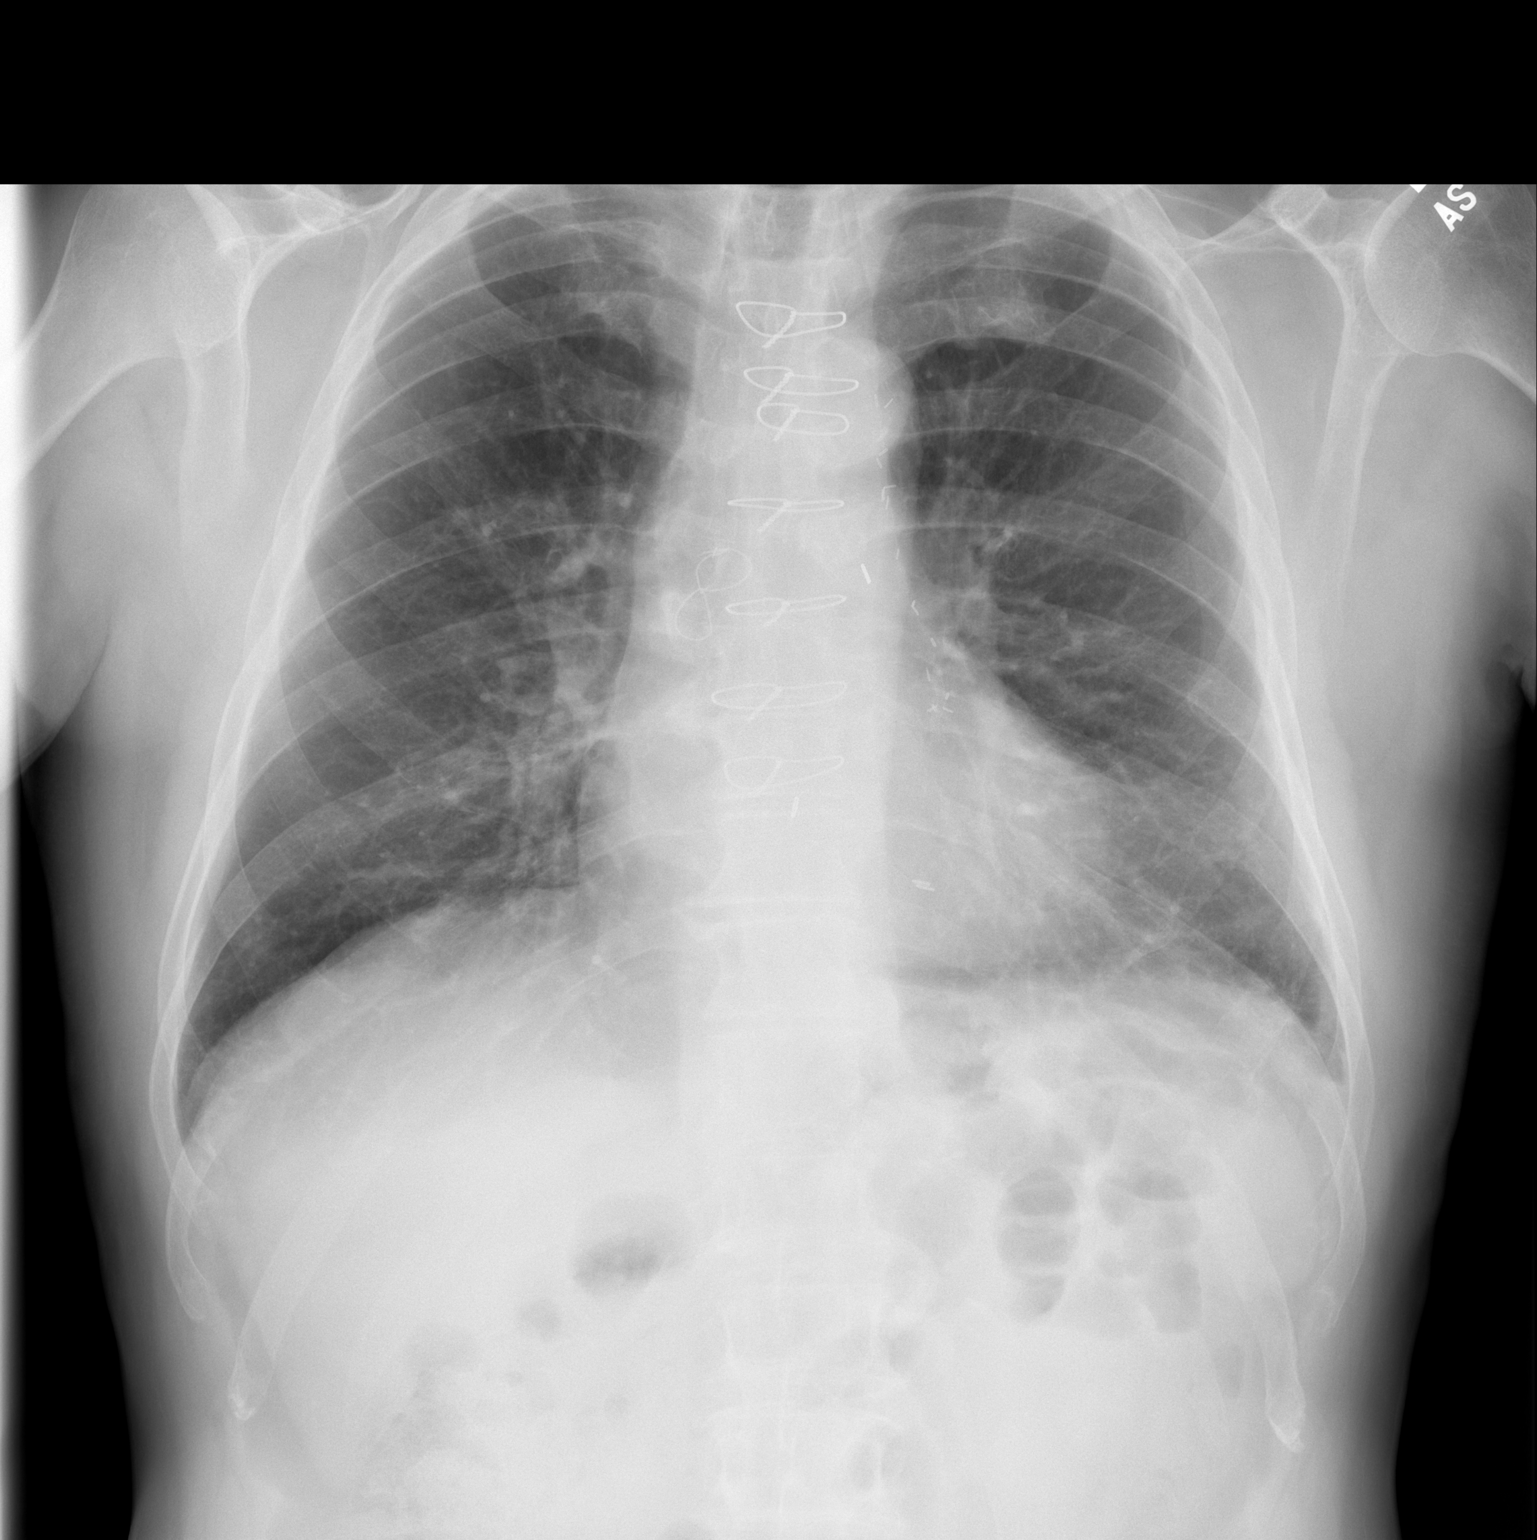

[w chest lat]
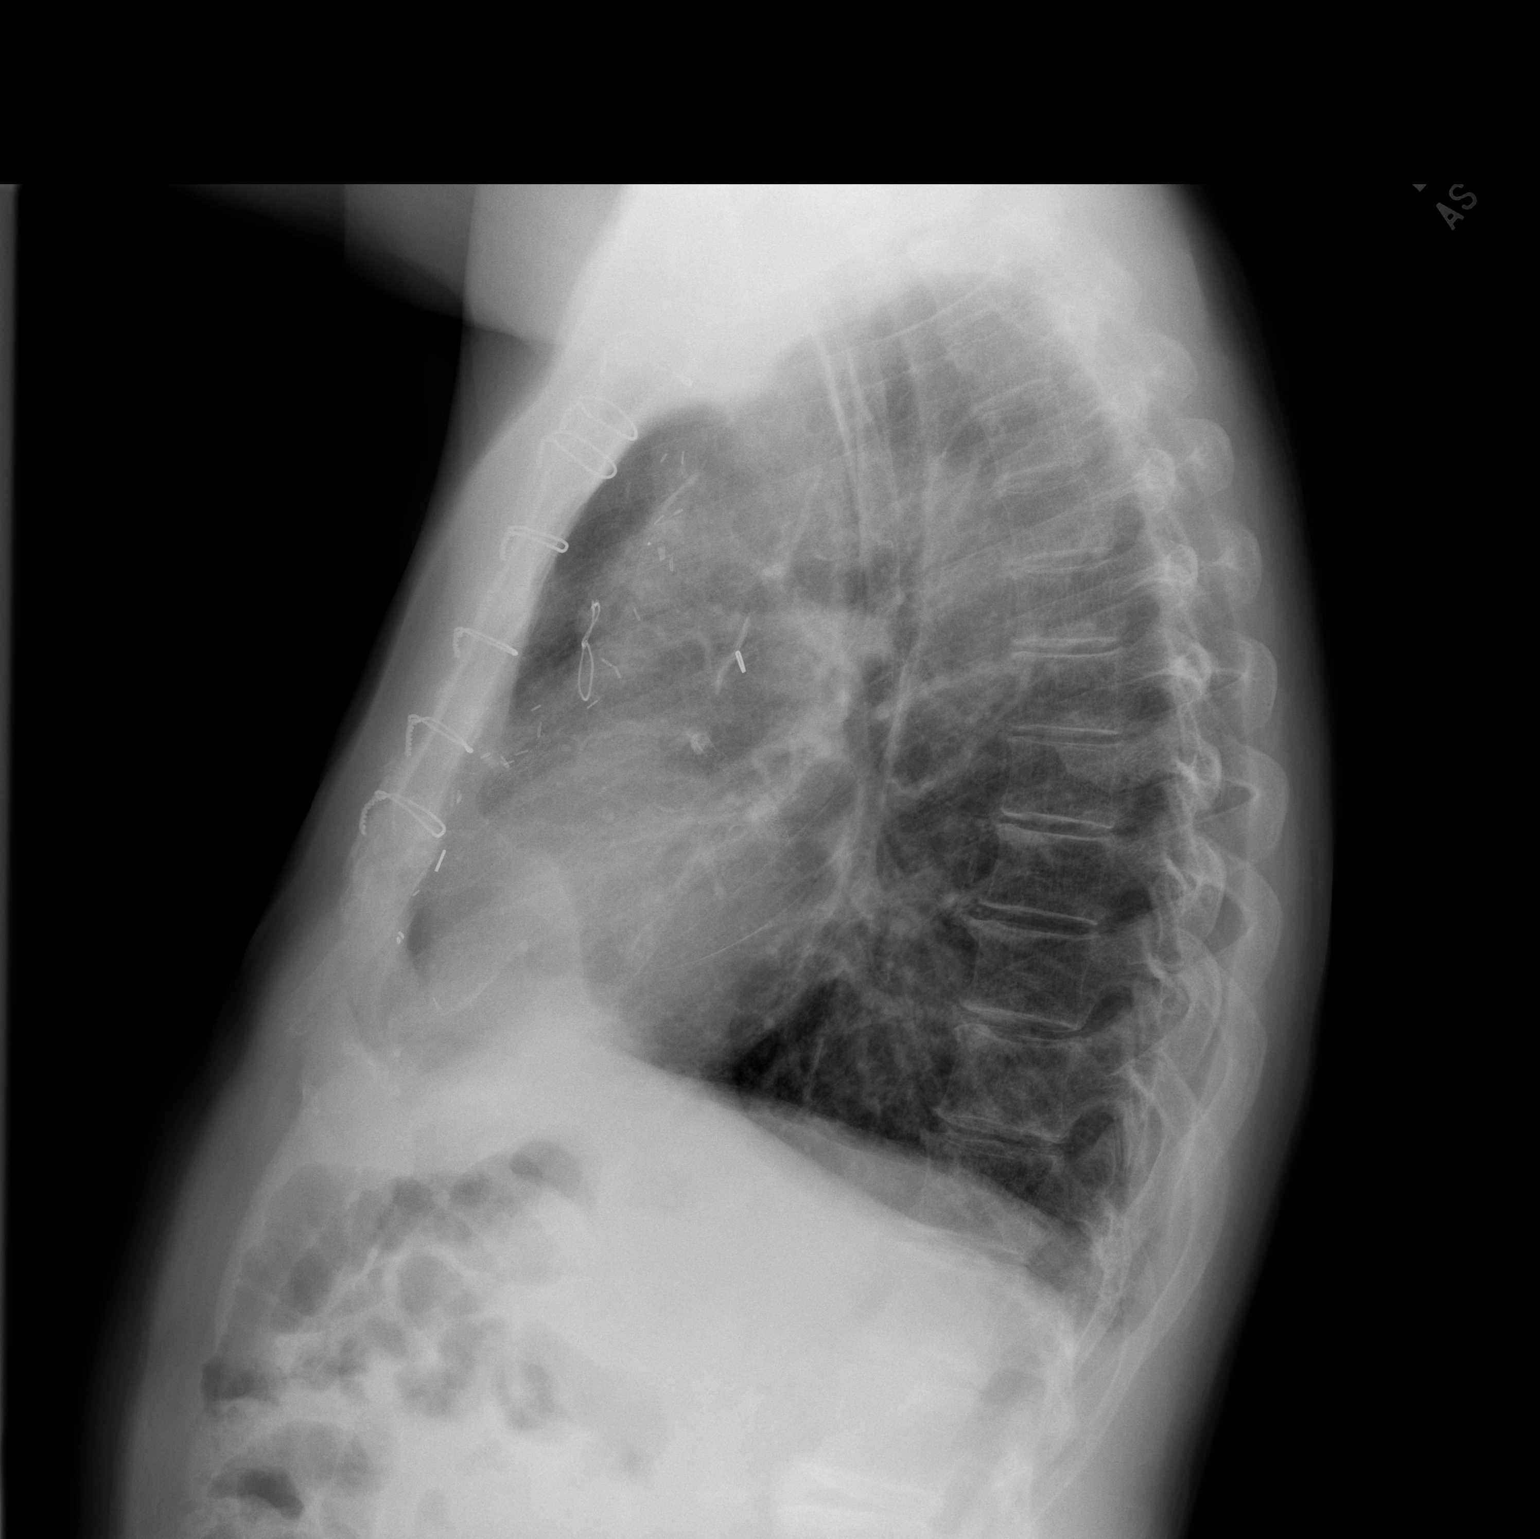

[2 of 2 positions shown; findings below may reference images not displayed]

FINDINGS: Cardiomediastinal silhouette is stable. Status post CABG. Stable
chronic mild interstitial prominence bilateral lower lobes. No acute
infiltrate or pulmonary edema. Mild degenerative changes lower
thoracic spine.
IMPRESSION: No active cardiopulmonary disease. No significant change. Status
post CABG.

## 2016-02-16 ENCOUNTER — Other Ambulatory Visit: Payer: Self-pay | Admitting: Cardiovascular Disease

## 2016-02-24 ENCOUNTER — Other Ambulatory Visit: Payer: Self-pay | Admitting: Physician Assistant

## 2016-03-18 ENCOUNTER — Telehealth: Payer: Self-pay | Admitting: Cardiovascular Disease

## 2016-03-18 ENCOUNTER — Other Ambulatory Visit: Payer: Self-pay | Admitting: Cardiovascular Disease

## 2016-03-18 NOTE — Telephone Encounter (Signed)
New Message  Pt requesting to speak with RN to receive a prescription for lab work to be complete closer to his home

## 2016-03-18 NOTE — Telephone Encounter (Signed)
Spoke with pt's wife and told her I did not see any lab work ordered. She will have pt call me back

## 2016-03-22 ENCOUNTER — Other Ambulatory Visit: Payer: Self-pay | Admitting: Cardiovascular Disease

## 2016-03-24 ENCOUNTER — Encounter: Payer: Self-pay | Admitting: Cardiovascular Disease

## 2016-03-24 DIAGNOSIS — E785 Hyperlipidemia, unspecified: Secondary | ICD-10-CM | POA: Diagnosis not present

## 2016-03-24 DIAGNOSIS — I10 Essential (primary) hypertension: Secondary | ICD-10-CM | POA: Diagnosis not present

## 2016-03-31 NOTE — Telephone Encounter (Signed)
Left message to call back  

## 2016-04-09 ENCOUNTER — Encounter: Payer: Self-pay | Admitting: Cardiovascular Disease

## 2016-04-09 ENCOUNTER — Encounter: Payer: Self-pay | Admitting: *Deleted

## 2016-04-09 ENCOUNTER — Ambulatory Visit (INDEPENDENT_AMBULATORY_CARE_PROVIDER_SITE_OTHER): Payer: Medicare Other | Admitting: Cardiovascular Disease

## 2016-04-09 DIAGNOSIS — I350 Nonrheumatic aortic (valve) stenosis: Secondary | ICD-10-CM

## 2016-04-09 DIAGNOSIS — I255 Ischemic cardiomyopathy: Secondary | ICD-10-CM | POA: Diagnosis not present

## 2016-04-09 DIAGNOSIS — I10 Essential (primary) hypertension: Secondary | ICD-10-CM | POA: Diagnosis not present

## 2016-04-09 DIAGNOSIS — Z720 Tobacco use: Secondary | ICD-10-CM

## 2016-04-09 DIAGNOSIS — E785 Hyperlipidemia, unspecified: Secondary | ICD-10-CM

## 2016-04-09 DIAGNOSIS — I200 Unstable angina: Secondary | ICD-10-CM | POA: Diagnosis not present

## 2016-04-09 DIAGNOSIS — I5022 Chronic systolic (congestive) heart failure: Secondary | ICD-10-CM

## 2016-04-09 NOTE — Patient Instructions (Signed)
Medication Instructions:  Your physician recommends that you continue on your current medications as directed. Please refer to the Current Medication list given to you today.   Labwork: Your physician recommends that you return for lab work on July 11,2017   Testing/Procedures: Your physician has requested that you have an echocardiogram. Echocardiography is a painless test that uses sound waves to create images of your heart. It provides your doctor with information about the size and shape of your heart and how well your heart's chambers and valves are working. This procedure takes approximately one hour. There are no restrictions for this procedure. Scheduled for July 11,2017 at 4:00  Your physician has requested that you have a cardiac catheterization. Cardiac catheterization is used to diagnose and/or treat various heart conditions. Doctors may recommend this procedure for a number of different reasons. The most common reason is to evaluate chest pain. Chest pain can be a symptom of coronary artery disease (CAD), and cardiac catheterization can show whether plaque is narrowing or blocking your heart's arteries. This procedure is also used to evaluate the valves, as well as measure the blood flow and oxygen levels in different parts of your heart. For further information please visit [email or web address]. Please follow instruction sheet, as given. Scheduled for July 12,2017    Follow-Up:  Your physician recommends that you schedule a follow-up appointment in: early August with PA or NP    Any Other Special Instructions Will Be Listed Below (If Applicable).     If you need a refill on your cardiac medications before your next appointment, please call your pharmacy.

## 2016-04-09 NOTE — Progress Notes (Signed)
Chief Complaint  Patient presents with  . Shortness of Breath    History of Present Illness: 70 y.o. Male with history of CAD s/p CABG in 1993, HTN, HLD, aortic stenosis here today for follow up. Admitted October 2013 and cath performed with occlusion of LAD, patent IMA to LAD, occluded proximal RCA with occluded SVG to PDA, severe disease in left main into Circumflex with occluded vein graft to Circumflex. Re-do CABG suggested but his aorta is severely calcified. He was seen by Dr. Servando Snare with CT surgery and not felt to be a candidate for re-do bypass. Imdur was added. He was admitted 10/14-10/18/14 with a non-STEMI. LHC (07/31/13): mLM 60 then 70, pLAD occluded, mLAD 99 after anastomosis of the graft, pCFX 50, OM stent with 70% ISR, pRCA occluded, SVG-PDA occluded, SVG-OM occluded, LIMA-LAD patent. PCI: Promus premier (2.25 x 20 mm) DES to the mid LAD extending back into the IMA graft. Echo (07/31/13) is: EF 55-60%, mild aortic stenosis (mean gradient 13), mild LAE. Readmitted 02/28/14 with inferolateral STEMI and found to have progression of disease in the left main/proximal Circumflex stent. A 3.0 x 28 mm Xience DES was placed from the Circumflex back into the left main. (The vein graft to the OM is known to be occluded). He did well following the procedure. LVEF 40-45% by echo 03/01/14 with moderate AI, mild AS. Admitted 08/01/14 with chest pain. Cardiac cath per Dr. Ellyn Hack with stenosis left main stent and OM 1. 3.0 x 23 mm Xience DES placed OM1. The left main stent restenosis treated with Donnelly balloon. He was treated with IV lasix for volume overload and discharged home on Lasix and aldactone. I saw him April 2016 and he c/o chest pain. Cardiac cath 01/2015 with stable disease. He was admitted to [facility] May 2016 with CHF and chest pain in the setting of probable bronchitis. He had new EKG changes and Troponin peaked at 1.38.Cardiac cath June 2016 with stable anatomy. LVEDP was 36 and volume overload  was likely contributing to his shortness of breath and cough. He was placed on Lasix, antibiotics and prednisone taper. He was also evaluated by pulmonology. Echocardiogram demonstrated EF 45-50% with inferoseptal and inferior hypokinesis, moderate aortic stenosis with mean gradient 18 mmHg, biatrial enlargement, mildly reduced RVSF. Of note, his ACE inhibitor was stopped due to worsening creatinine. He is followed by Dr. Melvyn Novas with pulmonology and was recently told that his COPD was not severe. He was placed on prednisone as well as PPI therapy by Dr. Melvyn Novas. He has been seen in our office July 2016 by Richardson Dopp, PA-C and was started on Ranexa but this was stopped due to cost. I last saw him November 2017. He has done well since then.   He is here today for follow up. He has worsened chest pain and dyspnea. Still smoking and states that it is the only thing he enjoys in life and will not stop.   Primary Care Physician: No PCP Per Patient   Past Medical History  Diagnosis Date  . Hypertension   . Hyperlipoproteinemia   . Aortic stenosis     a. Mod AS/AI by cath 07/2012.;  b.  Echo (07/31/13) is: EF 55-60%, mild aortic stenosis (mean gradient 13);  c. Echo (5/15):  EF 40-45%, mild AS (mean 12 mmHg), mod AI  . Back pain   . Carotid artery occlusion     a. Dopplers 07/2012: no significant high grade obstruction.  . Hyperlipidemia   . Abnormal  CT scan, kidney     07/2012 - multiple small cysts  . Cholelithiasis     Seen on CT 07/2012  . Coronary artery disease     a. CABG 10/1991. b. cath 07/2012 with prog dsz, turned down for re-do CABG - for med rx for now.; c. NSTEMI/PCI: DES to mLAD ext into IMA graft;  d. Inf STEMI (5/15):  LM 60-70% then 99% before LAD, pLAD occl, pCFX stent 80+% ISR, oRCA 99% then occl (L-R collats to dRCA), L-LAD ok with patent stent, S-CFX occl (old), S-RCA occl (old); PCI: LM ext into CFX with Xience Alpine DES  . Ischemic cardiomyopathy     a. 2D ECHO: 08/02/2014; EF  45%; severe hypokinesis base/mid-inferolat segments and severe hypokinesis base inferior segment. Mild LVH, mild AS (may be underestimated due to LV dysfunction).  Mean gradient (S): 14 mm Hg. Peak gradient (S): 25 mm Hg. VTI ratio of LVOT to aortic valve: 0.37. Mod LA dilation. Mild RV systolic dysfxn   . Heart failure (HCC) systolic    Past Surgical History  Procedure Laterality Date  . Cardiac catheterization  04/03/99  . Carpal tunnel release  2007    Excision mass dorsal left wrist  . Hernia repair  1988  . Coronary artery bypass graft  1993  . Fasciotomy Right 07/30/2013    Procedure: OPEN FASCIOTOMY RIGHT RING FINGER & RIGHT SMALL FINGER MULTIPLE LEVELS;  Surgeon: Wynonia Sours, MD;  Location: [place];  Service: Orthopedics;  Laterality: Right;  . Left heart catheterization with coronary/graft angiogram N/A 08/10/2012    Procedure: LEFT HEART CATHETERIZATION WITH Beatrix Fetters;  Surgeon: Hillary Bow, MD;  Location: [facility] CATH LAB;  Service: Cardiovascular;  Laterality: N/A;  . Percutaneous coronary stent intervention (pci-s)  07/31/2013    Procedure: PERCUTANEOUS CORONARY STENT INTERVENTION (PCI-S);  Surgeon: Burnell Blanks, MD;  Location: [facility] CATH LAB;  Service: Cardiovascular;;  LIMA to LAD at the anastomosis with aortic root shot   . Left heart catheterization with coronary angiogram N/A 02/28/2014    Procedure: LEFT HEART CATHETERIZATION WITH CORONARY ANGIOGRAM;  Surgeon: Troy Sine, MD;  Location: [facility] CATH LAB;  Service: Cardiovascular;  Laterality: N/A;  . Left heart catheterization with coronary/graft angiogram N/A 08/01/2014    Procedure: LEFT HEART CATHETERIZATION WITH Beatrix Fetters;  Surgeon: Leonie Man, MD;  Location: [facility] CATH LAB;  Service: Cardiovascular;  Laterality: N/A;  . Left heart catheterization with coronary/graft angiogram N/A 02/12/2015    Procedure: LEFT HEART CATHETERIZATION WITH Beatrix Fetters;   Surgeon: Burnell Blanks, MD;  Location: [facility] CATH LAB;  Service: Cardiovascular;  Laterality: N/A;  . Cardiac catheterization N/A 03/20/2015    Procedure: Left Heart Cath and Cors/Grafts Angiography;  Surgeon: Jettie Booze, MD;  Location: [place] CV LAB;  Service: Cardiovascular;  Laterality: N/A;    Current Outpatient Prescriptions  Medication Sig Dispense Refill  . amLODipine (NORVASC) 5 MG tablet Take 1 tablet (5 mg total) by mouth daily. 30 tablet 5  . aspirin EC 81 MG tablet Take 81 mg by mouth at bedtime.    Marland Kitchen atorvastatin (LIPITOR) 80 MG tablet Take 1 tablet (80 mg total) by mouth daily. 30 tablet 11  . bisoprolol (ZEBETA) 5 MG tablet Take 0.5 tablets (2.5 mg total) by mouth 2 (two) times daily. 60 tablet 11  . clopidogrel (PLAVIX) 75 MG tablet Take 1 tablet (75 mg total) by mouth daily. 30 tablet 5  . famotidine (PEPCID) 20 MG  tablet Take 20 mg by mouth 2 (two) times daily.    . fexofenadine (ALLEGRA) 180 MG tablet Take 180 mg by mouth daily.     . furosemide (LASIX) 40 MG tablet Take 1 tablet (40 mg total) by mouth 2 (two) times daily. 60 tablet 11  . isosorbide mononitrate (IMDUR) 60 MG 24 hr tablet TAKE 1 AND 1/2 TABLET (90 MG TOTAL) BY MOUTH DAILY. 45 tablet 11  . KLOR-CON M20 20 MEQ tablet TAKE 1 TABLET (20MEQ) BY MOUTH DAILY. 30 tablet 6  . nitroGLYCERIN (NITROSTAT) 0.4 MG SL tablet TAKE 1 TABLET UNDER THE TONGUE AS NEEDEDFOR CHEST PAIN ( MAY REPEAT EVERY 5 MINUTES X 3) 25 tablet 3  . pantoprazole (PROTONIX) 40 MG tablet Take 40 mg by mouth daily as needed (GI upset). fo    . spironolactone (ALDACTONE) 25 MG tablet TAKE 1/2 TABLET BY MOUTH TWICE A DAY. 30 tablet 4   No current facility-administered medications for this visit.    No Known Allergies  Social History   Social History  . Marital Status: Married    Spouse Name: N/A  . Number of Children: 1  . Years of Education: N/A   Occupational History  . Plumbing     Retired in 2007   Social History  Main Topics  . Smoking status: Current Every Day Smoker -- 1.00 packs/day for 57 years    Types: Cigarettes  . Smokeless tobacco: Never Used  . Alcohol Use: 3.0 oz/week    5 Cans of beer per week     Comment: daily-6 daily  . Drug Use: No  . Sexual Activity: Not on file   Other Topics Concern  . Not on file   Social History Narrative   Lives in [place] with his family.  Does not routinely exercise.    Family History  Problem Relation Age of Onset  . Heart attack Mother     died @ 95.  Marland Kitchen Hypertension Sister   . Emphysema Brother   . Stroke Neg Hx   . Hypertension Mother   . Hypertension Father   . Hypertension Brother   . Allergies Sister     Review of Systems:  As stated in the HPI and otherwise negative.   BP 140/78 mmHg  Pulse 61  Ht 5\' 5"  (1.651 m)  Wt 152 lb (68.947 kg)  BMI 25.29 kg/m2  SpO2 98%  Physical Examination: General: Well developed, well nourished, NAD HEENT: OP clear, mucus membranes moist SKIN: warm, dry. No rashes. Neuro: No focal deficits Musculoskeletal: Muscle strength 5/5 all ext Psychiatric: Mood and affect normal Neck: No JVD, no carotid bruits, no thyromegaly, no lymphadenopathy. Lungs:Clear bilaterally, no wheezes, rhonci, crackles Cardiovascular: Regular rate and rhythm. Systolic murmur noted. No gallops or rubs. Abdomen:Soft. Bowel sounds present. Non-tender.  Extremities: No lower extremity edema. Pulses are 1 + in the bilateral DP/PT.  Cardiac cath April 2016:  Left main: Long segment with stent noted throughout the vessel. The stented segment is patent with mild diffuse 20% restenosis.  Left Anterior Descending Artery: Large caliber vessel that courses to the apex. There is 100% ostial occlusion. The mid and distal vessel fills from the patent IMA graft.  Circumflex Artery: Large caliber vessel with stent extending from the ostium into the OM1. There is diffuse 30% restenosis in the stented segment. The mid and distal segment of  the has mild diffuse disease. The AV groove Circumflex is occluded and fills from left to left collaterals.  Right  Coronary Artery: Known to be occluded at the ostium. (not engaged). The distal vessel fills from left to right collaterals supplied by the Circumflex and the LAD.  Graft Anatomy:  SVG to PDA is known to be occluded and is not selectively engaged.  SVG to OM is known to be occluded and is not selectively engaged.  LIMA to LAD is patent. The stent in the mid LAD beyond the graft insertion is patent with minimal restenosis.  Left Ventricular Angiogram: LVEF=35-40%. Global hypokinesis with more severe hypokinesis in the inferior wall.   Echo 03/21/15: Left ventricle: Severe hypokinesis of base inferoseptal segment and base/mid inferior segments. The cavity size was mildly dilated. Wall thickness was normal. Systolic function was mildly reduced. The estimated ejection fraction was in the range of 45% to 50%. - Aortic valve: There was moderate stenosis. There was mild regurgitation. Mean gradient (S): 18 mm Hg. Peak gradient (S): 33 mm Hg. - Left atrium: The atrium was moderately dilated. - Right ventricle: The cavity size was normal. Systolic function was mildly reduced. - Right atrium: The atrium was mildly dilated  EKG:  EKG is ordered today. The ekg ordered today demonstrates NSR, IVCD. Non-specific ST abnormalty.   Recent Labs: 05/01/2015: BNP 407.2* 08/18/2015: ALT 12 09/01/2015: BUN 25; Creatinine, Ser 1.41*; Potassium 4.4; Sodium 141   Lipid Panel    Component Value Date/Time   CHOL 137 08/18/2015 1056   CHOL 146 03/31/2015 0905   TRIG 128 08/18/2015 1056   HDL 42 08/18/2015 1056   HDL 42 03/31/2015 0905   CHOLHDL 3.3 08/18/2015 1056   VLDL 26 08/18/2015 1056   LDLCALC 69 08/18/2015 1056   LDLCALC 77 03/31/2015 0905     Wt Readings from Last 3 Encounters:  04/09/16 152 lb (68.947 kg)  08/20/15 151 lb (68.493 kg)  06/27/15 149 lb (67.586 kg)       Other studies Reviewed: Additional studies/ records that were reviewed today include:  Review of the above records demonstrates:    Assessment and Plan:   1. CAD with unstable angina: He is s/p CABG 1993 and most recent cath June 2016 with stable anatomy with patent IMA to LAD, occlusion of both vein grafts to the PDA and Circumflex, patent left main to Circumflex stent, left to right collaterals filling RCA. He is compliant with his medications but refuses to stop smoking which is contributing to progression of CAD. He understands this. With recent changes worrisome for unstable angina, will plan right and left heart catheterization at [place] July 12,2017. Pre cath labs week of case. Risks and benefits reviewed and he agrees to proceed. Continue current meds.   2. CAROTID ARTERY DISEASE: The patient had Dopplers done in the fall of 2013 and does not have significant carotid disease   3. Hyperlipidemia: Continue statin. Lipids controlled.   4. Aortic valve disease: Moderate AS by echo June 2016. Will repeat echo now.   5. HTN: BP controlled. No changes.   6. Tobacco abuse: Smoking cessation encouraged. He does not wish to stop  7. Ischemic cardiomyopathy/Chronic systolic CHF: Continue meds including Lasix and aldactone, beta blocker. Volume status is ok. Last LvEF=45-50% by echo June 2016.  Current medicines are reviewed at length with the patient today.  The patient does not have concerns regarding medicines.  The following changes have been made:  Lasix dose reduced to 40 mg daily  Labs/ tests ordered today include:   Orders Placed This Encounter  Procedures  . Basic Metabolic Panel (  BMET)  . CBC  . INR/PT  . EKG 12-Lead  . ECHO COMPLETE    Disposition:   FU with me after cath s  Signed, Lauree Chandler, MD 04/09/2016 12:48 PM    [address], [place], [place]  [address] Phone: [phone]; Fax: [phone]

## 2016-04-09 NOTE — Telephone Encounter (Signed)
Pt saw Dr. McAlhany today 

## 2016-04-27 ENCOUNTER — Ambulatory Visit (HOSPITAL_COMMUNITY): Payer: Medicare Other | Attending: Cardiology

## 2016-04-27 ENCOUNTER — Other Ambulatory Visit: Payer: Self-pay

## 2016-04-27 ENCOUNTER — Other Ambulatory Visit (INDEPENDENT_AMBULATORY_CARE_PROVIDER_SITE_OTHER): Payer: Medicare Other

## 2016-04-27 DIAGNOSIS — I2510 Atherosclerotic heart disease of native coronary artery without angina pectoris: Secondary | ICD-10-CM | POA: Insufficient documentation

## 2016-04-27 DIAGNOSIS — I352 Nonrheumatic aortic (valve) stenosis with insufficiency: Secondary | ICD-10-CM | POA: Insufficient documentation

## 2016-04-27 DIAGNOSIS — I509 Heart failure, unspecified: Secondary | ICD-10-CM | POA: Insufficient documentation

## 2016-04-27 DIAGNOSIS — I200 Unstable angina: Secondary | ICD-10-CM | POA: Diagnosis not present

## 2016-04-27 DIAGNOSIS — Z720 Tobacco use: Secondary | ICD-10-CM | POA: Insufficient documentation

## 2016-04-27 DIAGNOSIS — Z8249 Family history of ischemic heart disease and other diseases of the circulatory system: Secondary | ICD-10-CM | POA: Diagnosis not present

## 2016-04-27 DIAGNOSIS — I110 Hypertensive heart disease with heart failure: Secondary | ICD-10-CM | POA: Insufficient documentation

## 2016-04-27 DIAGNOSIS — I059 Rheumatic mitral valve disease, unspecified: Secondary | ICD-10-CM | POA: Insufficient documentation

## 2016-04-27 DIAGNOSIS — I5022 Chronic systolic (congestive) heart failure: Secondary | ICD-10-CM

## 2016-04-27 DIAGNOSIS — I350 Nonrheumatic aortic (valve) stenosis: Secondary | ICD-10-CM | POA: Diagnosis not present

## 2016-04-27 DIAGNOSIS — Z951 Presence of aortocoronary bypass graft: Secondary | ICD-10-CM | POA: Diagnosis not present

## 2016-04-27 DIAGNOSIS — R0600 Dyspnea, unspecified: Secondary | ICD-10-CM | POA: Diagnosis present

## 2016-04-27 DIAGNOSIS — E785 Hyperlipidemia, unspecified: Secondary | ICD-10-CM | POA: Insufficient documentation

## 2016-04-27 DIAGNOSIS — I255 Ischemic cardiomyopathy: Secondary | ICD-10-CM | POA: Insufficient documentation

## 2016-04-27 LAB — CBC
HCT: 43.5 % (ref 38.5–50.0)
Hemoglobin: 14.7 g/dL (ref 13.2–17.1)
MCH: 32 pg (ref 27.0–33.0)
MCHC: 33.8 g/dL (ref 32.0–36.0)
MCV: 94.8 fL (ref 80.0–100.0)
MPV: 11.7 fL (ref 7.5–12.5)
PLATELETS: 230 10*3/uL (ref 140–400)
RBC: 4.59 MIL/uL (ref 4.20–5.80)
RDW: 12.6 % (ref 11.0–15.0)
WBC: 10.1 10*3/uL (ref 3.8–10.8)

## 2016-04-28 ENCOUNTER — Encounter (HOSPITAL_COMMUNITY)
Admission: RE | Disposition: A | Discharge status: Home / Self Care | Payer: Self-pay | Source: Ambulatory Visit | Attending: Cardiovascular Disease

## 2016-04-28 ENCOUNTER — Ambulatory Visit (HOSPITAL_COMMUNITY)
Admission: RE | Admit: 2016-04-28 | Discharge: 2016-04-28 | Disposition: A | Discharge status: Home / Self Care | Payer: Medicare Other | Source: Ambulatory Visit | Attending: Cardiovascular Disease | Admitting: Cardiovascular Disease

## 2016-04-28 DIAGNOSIS — Z7982 Long term (current) use of aspirin: Secondary | ICD-10-CM | POA: Diagnosis not present

## 2016-04-28 DIAGNOSIS — I110 Hypertensive heart disease with heart failure: Secondary | ICD-10-CM | POA: Diagnosis not present

## 2016-04-28 DIAGNOSIS — I200 Unstable angina: Secondary | ICD-10-CM

## 2016-04-28 DIAGNOSIS — Y712 Prosthetic and other implants, materials and accessory cardiovascular devices associated with adverse incidents: Secondary | ICD-10-CM | POA: Insufficient documentation

## 2016-04-28 DIAGNOSIS — I252 Old myocardial infarction: Secondary | ICD-10-CM | POA: Insufficient documentation

## 2016-04-28 DIAGNOSIS — I25710 Atherosclerosis of autologous vein coronary artery bypass graft(s) with unstable angina pectoris: Secondary | ICD-10-CM | POA: Insufficient documentation

## 2016-04-28 DIAGNOSIS — I5022 Chronic systolic (congestive) heart failure: Secondary | ICD-10-CM | POA: Diagnosis not present

## 2016-04-28 DIAGNOSIS — I2582 Chronic total occlusion of coronary artery: Secondary | ICD-10-CM | POA: Diagnosis not present

## 2016-04-28 DIAGNOSIS — Z823 Family history of stroke: Secondary | ICD-10-CM | POA: Insufficient documentation

## 2016-04-28 DIAGNOSIS — I6529 Occlusion and stenosis of unspecified carotid artery: Secondary | ICD-10-CM | POA: Diagnosis not present

## 2016-04-28 DIAGNOSIS — I255 Ischemic cardiomyopathy: Secondary | ICD-10-CM | POA: Diagnosis not present

## 2016-04-28 DIAGNOSIS — Z7902 Long term (current) use of antithrombotics/antiplatelets: Secondary | ICD-10-CM | POA: Diagnosis not present

## 2016-04-28 DIAGNOSIS — I2584 Coronary atherosclerosis due to calcified coronary lesion: Secondary | ICD-10-CM | POA: Diagnosis not present

## 2016-04-28 DIAGNOSIS — J449 Chronic obstructive pulmonary disease, unspecified: Secondary | ICD-10-CM | POA: Insufficient documentation

## 2016-04-28 DIAGNOSIS — E785 Hyperlipidemia, unspecified: Secondary | ICD-10-CM | POA: Insufficient documentation

## 2016-04-28 DIAGNOSIS — I25110 Atherosclerotic heart disease of native coronary artery with unstable angina pectoris: Secondary | ICD-10-CM | POA: Diagnosis not present

## 2016-04-28 DIAGNOSIS — Z8249 Family history of ischemic heart disease and other diseases of the circulatory system: Secondary | ICD-10-CM | POA: Diagnosis not present

## 2016-04-28 DIAGNOSIS — F17210 Nicotine dependence, cigarettes, uncomplicated: Secondary | ICD-10-CM | POA: Diagnosis not present

## 2016-04-28 DIAGNOSIS — I350 Nonrheumatic aortic (valve) stenosis: Secondary | ICD-10-CM | POA: Diagnosis not present

## 2016-04-28 DIAGNOSIS — T82855A Stenosis of coronary artery stent, initial encounter: Secondary | ICD-10-CM | POA: Diagnosis not present

## 2016-04-28 HISTORY — PX: CARDIAC CATHETERIZATION: SHX172

## 2016-04-28 LAB — POCT I-STAT 3, VENOUS BLOOD GAS (G3P V)
Acid-base deficit: 1 mmol/L (ref 0.0–2.0)
BICARBONATE: 24.5 meq/L — AB (ref 20.0–24.0)
O2 Saturation: 56 %
PCO2 VEN: 43.8 mmHg — AB (ref 45.0–50.0)
TCO2: 26 mmol/L (ref 0–100)
pH, Ven: 7.355 — ABNORMAL HIGH (ref 7.250–7.300)
pO2, Ven: 31 mmHg (ref 31.0–45.0)

## 2016-04-28 LAB — POCT I-STAT 3, ART BLOOD GAS (G3+)
ACID-BASE DEFICIT: 3 mmol/L — AB (ref 0.0–2.0)
BICARBONATE: 22 meq/L (ref 20.0–24.0)
O2 SAT: 92 %
PO2 ART: 66 mmHg — AB (ref 80.0–100.0)
TCO2: 23 mmol/L (ref 0–100)
pCO2 arterial: 39 mmHg (ref 35.0–45.0)
pH, Arterial: 7.36 (ref 7.350–7.450)

## 2016-04-28 LAB — BASIC METABOLIC PANEL
BUN: 22 mg/dL (ref 7–25)
CHLORIDE: 101 mmol/L (ref 98–110)
CO2: 26 mmol/L (ref 20–31)
CREATININE: 1.27 mg/dL — AB (ref 0.70–1.18)
Calcium: 9.2 mg/dL (ref 8.6–10.3)
Glucose, Bld: 74 mg/dL (ref 65–99)
Potassium: 4.8 mmol/L (ref 3.5–5.3)
Sodium: 138 mmol/L (ref 135–146)

## 2016-04-28 LAB — PROTIME-INR
INR: 1
Prothrombin Time: 10.2 s (ref 9.0–11.5)

## 2016-04-28 SURGERY — RIGHT/LEFT HEART CATH AND CORONARY/GRAFT ANGIOGRAPHY
Anesthesia: LOCAL

## 2016-04-28 MED ORDER — FENTANYL CITRATE (PF) 100 MCG/2ML IJ SOLN
INTRAMUSCULAR | Status: AC
  Filled 2016-04-28: qty 2

## 2016-04-28 MED ORDER — LIDOCAINE HCL (PF) 1 % IJ SOLN
INTRAMUSCULAR | Status: DC | PRN
  Administered 2016-04-28: 9 mL via INTRADERMAL
  Administered 2016-04-28: 14 mL via INTRADERMAL

## 2016-04-28 MED ORDER — SODIUM CHLORIDE 0.9% FLUSH: 3.0000 mL | Freq: Two times a day (BID) | INTRAVENOUS | Status: DC

## 2016-04-28 MED ORDER — MIDAZOLAM HCL 2 MG/2ML IJ SOLN
INTRAMUSCULAR | Status: AC
  Filled 2016-04-28: qty 2

## 2016-04-28 MED ORDER — SODIUM CHLORIDE 0.9% FLUSH: 3.0000 mL | INTRAVENOUS | Status: DC | PRN

## 2016-04-28 MED ORDER — IOPAMIDOL (ISOVUE-370) INJECTION 76%
INTRAVENOUS | Status: AC
  Filled 2016-04-28: qty 125

## 2016-04-28 MED ORDER — MIDAZOLAM HCL 2 MG/2ML IJ SOLN
INTRAMUSCULAR | Status: DC | PRN
  Administered 2016-04-28: 1 mg via INTRAVENOUS
  Administered 2016-04-28: 2 mg via INTRAVENOUS

## 2016-04-28 MED ORDER — FENTANYL CITRATE (PF) 100 MCG/2ML IJ SOLN
INTRAMUSCULAR | Status: DC | PRN
  Administered 2016-04-28: 25 ug via INTRAVENOUS
  Administered 2016-04-28: 50 ug via INTRAVENOUS

## 2016-04-28 MED ORDER — VERAPAMIL HCL 2.5 MG/ML IV SOLN
INTRAVENOUS | Status: AC
  Filled 2016-04-28: qty 2

## 2016-04-28 MED ORDER — HEPARIN (PORCINE) IN NACL 2-0.9 UNIT/ML-% IJ SOLN
INTRAMUSCULAR | Status: AC
  Filled 2016-04-28: qty 1000

## 2016-04-28 MED ORDER — ASPIRIN 81 MG PO CHEW
81.0000 mg | CHEWABLE_TABLET | ORAL | Status: AC
  Administered 2016-04-28: 81 mg via ORAL

## 2016-04-28 MED ORDER — SODIUM CHLORIDE 0.9 % IV SOLN: 250.0000 mL | INTRAVENOUS | Status: DC | PRN

## 2016-04-28 MED ORDER — HEPARIN (PORCINE) IN NACL 2-0.9 UNIT/ML-% IJ SOLN
INTRAMUSCULAR | Status: DC | PRN
  Administered 2016-04-28: 12:00:00

## 2016-04-28 MED ORDER — SODIUM CHLORIDE 0.9 % IV SOLN: INTRAVENOUS | Status: AC

## 2016-04-28 MED ORDER — ASPIRIN 81 MG PO CHEW
CHEWABLE_TABLET | ORAL | Status: AC
  Administered 2016-04-28: 81 mg via ORAL
  Filled 2016-04-28: qty 1

## 2016-04-28 MED ORDER — LIDOCAINE HCL (PF) 1 % IJ SOLN
INTRAMUSCULAR | Status: AC
  Filled 2016-04-28: qty 30

## 2016-04-28 MED ORDER — IOPAMIDOL (ISOVUE-370) INJECTION 76%
INTRAVENOUS | Status: DC | PRN
  Administered 2016-04-28: 90 mL via INTRA_ARTERIAL

## 2016-04-28 MED ORDER — HEPARIN (PORCINE) IN NACL 2-0.9 UNIT/ML-% IJ SOLN
INTRAMUSCULAR | Status: DC | PRN
  Administered 2016-04-28: 1000 mL via INTRA_ARTERIAL

## 2016-04-28 MED ORDER — SODIUM CHLORIDE 0.9 % IV SOLN
INTRAVENOUS | Status: AC
  Administered 2016-04-28: 09:00:00 via INTRAVENOUS

## 2016-04-28 SURGICAL SUPPLY — 15 items
CATH BALLN WEDGE 5F 110CM (CATHETERS) ×1 IMPLANT
CATH INFINITI 5FR MULTPACK ANG (CATHETERS) ×1 IMPLANT
CATH SWAN GANZ 7F STRAIGHT (CATHETERS) ×2 IMPLANT
GLIDESHEATH SLEND SS 6F .021 (SHEATH) ×1 IMPLANT
GUIDEWIRE .025 260CM (WIRE) ×1 IMPLANT
KIT HEART LEFT (KITS) ×2 IMPLANT
PACK CARDIAC CATHETERIZATION (CUSTOM PROCEDURE TRAY) ×2 IMPLANT
SHEATH FAST CATH BRACH 5F 5CM (SHEATH) ×1 IMPLANT
SHEATH PINNACLE 5F 10CM (SHEATH) ×1 IMPLANT
SHEATH PINNACLE 7F 10CM (SHEATH) ×2 IMPLANT
SYR MEDRAD MARK V 150ML (SYRINGE) ×2 IMPLANT
TRANSDUCER W/STOPCOCK (MISCELLANEOUS) ×3 IMPLANT
TUBING CIL FLEX 10 FLL-RA (TUBING) ×2 IMPLANT
WIRE EMERALD 3MM-J .035X150CM (WIRE) ×2 IMPLANT
WIRE SAFE-T 1.5MM-J .035X260CM (WIRE) ×1 IMPLANT

## 2016-04-28 NOTE — Progress Notes (Signed)
Site area: rt groin fa and fv sheaths pulled by Kathlene November Site Prior to Removal:  Level 0 Pressure Applied For:  30 minutes Manual:   yes Patient Status During Pull:  stable Post Pull Site:  Level  0 Post Pull Instructions Given:  yes Post Pull Pulses Present: yes Dressing Applied:  tegaderm Bedrest begins @  1240 Comments:

## 2016-04-28 NOTE — Progress Notes (Signed)
Site area: lt brachial venous sheath pulled and pressure held by Kathlene November Site Prior to Removal:  Level 0 Pressure Applied For:  10 minutes Manual:   yes Patient Status During Pull:  stable Post Pull Site:  Level  0 Post Pull Instructions Given:  yes Post Pull Pulses Present: yes Dressing Applied:  tegaderm Bedrest begins @  Comments:

## 2016-04-28 NOTE — H&P (View-Only) (Signed)
Chief Complaint  Patient presents with  . Shortness of Breath    History of Present Illness: 70 y.o. Male with history of CAD s/p CABG in 1993, HTN, HLD, aortic stenosis here today for follow up. Admitted October 2013 and cath performed with occlusion of LAD, patent IMA to LAD, occluded proximal RCA with occluded SVG to PDA, severe disease in left main into Circumflex with occluded vein graft to Circumflex. Re-do CABG suggested but his aorta is severely calcified. He was seen by Dr. Servando Snare with CT surgery and not felt to be a candidate for re-do bypass. Imdur was added. He was admitted 10/14-10/18/14 with a non-STEMI. LHC (07/31/13): mLM 60 then 70, pLAD occluded, mLAD 99 after anastomosis of the graft, pCFX 50, OM stent with 70% ISR, pRCA occluded, SVG-PDA occluded, SVG-OM occluded, LIMA-LAD patent. PCI: Promus premier (2.25 x 20 mm) DES to the mid LAD extending back into the IMA graft. Echo (07/31/13) is: EF 55-60%, mild aortic stenosis (mean gradient 13), mild LAE. Readmitted 02/28/14 with inferolateral STEMI and found to have progression of disease in the left main/proximal Circumflex stent. A 3.0 x 28 mm Xience DES was placed from the Circumflex back into the left main. (The vein graft to the OM is known to be occluded). He did well following the procedure. LVEF 40-45% by echo 03/01/14 with moderate AI, mild AS. Admitted 08/01/14 with chest pain. Cardiac cath per Dr. Ellyn Hack with stenosis left main stent and OM 1. 3.0 x 23 mm Xience DES placed OM1. The left main stent restenosis treated with  balloon. He was treated with IV lasix for volume overload and discharged home on Lasix and aldactone. I saw him April 2016 and he c/o chest pain. Cardiac cath 01/2015 with stable disease. He was admitted to [facility] May 2016 with CHF and chest pain in the setting of probable bronchitis. He had new EKG changes and Troponin peaked at 1.38.Cardiac cath June 2016 with stable anatomy. LVEDP was 36 and volume overload  was likely contributing to his shortness of breath and cough. He was placed on Lasix, antibiotics and prednisone taper. He was also evaluated by pulmonology. Echocardiogram demonstrated EF 45-50% with inferoseptal and inferior hypokinesis, moderate aortic stenosis with mean gradient 18 mmHg, biatrial enlargement, mildly reduced RVSF. Of note, his ACE inhibitor was stopped due to worsening creatinine. He is followed by Dr. Melvyn Novas with pulmonology and was recently told that his COPD was not severe. He was placed on prednisone as well as PPI therapy by Dr. Melvyn Novas. He has been seen in our office July 2016 by Richardson Dopp, PA-C and was started on Ranexa but this was stopped due to cost. I last saw him November 2017. He has done well since then.   He is here today for follow up. He has worsened chest pain and dyspnea. Still smoking and states that it is the only thing he enjoys in life and will not stop.   Primary Care Physician: No PCP Per Patient   Past Medical History  Diagnosis Date  . Hypertension   . Hyperlipoproteinemia   . Aortic stenosis     a. Mod AS/AI by cath 07/2012.;  b.  Echo (07/31/13) is: EF 55-60%, mild aortic stenosis (mean gradient 13);  c. Echo (5/15):  EF 40-45%, mild AS (mean 12 mmHg), mod AI  . Back pain   . Carotid artery occlusion     a. Dopplers 07/2012: no significant high grade obstruction.  . Hyperlipidemia   . Abnormal  CT scan, kidney     07/2012 - multiple small cysts  . Cholelithiasis     Seen on CT 07/2012  . Coronary artery disease     a. CABG 10/1991. b. cath 07/2012 with prog dsz, turned down for re-do CABG - for med rx for now.; c. NSTEMI/PCI: DES to mLAD ext into IMA graft;  d. Inf STEMI (5/15):  LM 60-70% then 99% before LAD, pLAD occl, pCFX stent 80+% ISR, oRCA 99% then occl (L-R collats to dRCA), L-LAD ok with patent stent, S-CFX occl (old), S-RCA occl (old); PCI: LM ext into CFX with Xience Alpine DES  . Ischemic cardiomyopathy     a. 2D ECHO: 08/02/2014; EF  45%; severe hypokinesis base/mid-inferolat segments and severe hypokinesis base inferior segment. Mild LVH, mild AS (may be underestimated due to LV dysfunction).  Mean gradient (S): 14 mm Hg. Peak gradient (S): 25 mm Hg. VTI ratio of LVOT to aortic valve: 0.37. Mod LA dilation. Mild RV systolic dysfxn   . Heart failure (HCC) systolic    Past Surgical History  Procedure Laterality Date  . Cardiac catheterization  04/03/99  . Carpal tunnel release  2007    Excision mass dorsal left wrist  . Hernia repair  1988  . Coronary artery bypass graft  1993  . Fasciotomy Right 07/30/2013    Procedure: OPEN FASCIOTOMY RIGHT RING FINGER & RIGHT SMALL FINGER MULTIPLE LEVELS;  Surgeon: Wynonia Sours, MD;  Location: [place];  Service: Orthopedics;  Laterality: Right;  . Left heart catheterization with coronary/graft angiogram N/A 08/10/2012    Procedure: LEFT HEART CATHETERIZATION WITH Beatrix Fetters;  Surgeon: Hillary Bow, MD;  Location: [facility] CATH LAB;  Service: Cardiovascular;  Laterality: N/A;  . Percutaneous coronary stent intervention (pci-s)  07/31/2013    Procedure: PERCUTANEOUS CORONARY STENT INTERVENTION (PCI-S);  Surgeon: Burnell Blanks, MD;  Location: [facility] CATH LAB;  Service: Cardiovascular;;  LIMA to LAD at the anastomosis with aortic root shot   . Left heart catheterization with coronary angiogram N/A 02/28/2014    Procedure: LEFT HEART CATHETERIZATION WITH CORONARY ANGIOGRAM;  Surgeon: Troy Sine, MD;  Location: [facility] CATH LAB;  Service: Cardiovascular;  Laterality: N/A;  . Left heart catheterization with coronary/graft angiogram N/A 08/01/2014    Procedure: LEFT HEART CATHETERIZATION WITH Beatrix Fetters;  Surgeon: Leonie Man, MD;  Location: [facility] CATH LAB;  Service: Cardiovascular;  Laterality: N/A;  . Left heart catheterization with coronary/graft angiogram N/A 02/12/2015    Procedure: LEFT HEART CATHETERIZATION WITH Beatrix Fetters;   Surgeon: Burnell Blanks, MD;  Location: [facility] CATH LAB;  Service: Cardiovascular;  Laterality: N/A;  . Cardiac catheterization N/A 03/20/2015    Procedure: Left Heart Cath and Cors/Grafts Angiography;  Surgeon: Jettie Booze, MD;  Location: [place] CV LAB;  Service: Cardiovascular;  Laterality: N/A;    Current Outpatient Prescriptions  Medication Sig Dispense Refill  . amLODipine (NORVASC) 5 MG tablet Take 1 tablet (5 mg total) by mouth daily. 30 tablet 5  . aspirin EC 81 MG tablet Take 81 mg by mouth at bedtime.    Marland Kitchen atorvastatin (LIPITOR) 80 MG tablet Take 1 tablet (80 mg total) by mouth daily. 30 tablet 11  . bisoprolol (ZEBETA) 5 MG tablet Take 0.5 tablets (2.5 mg total) by mouth 2 (two) times daily. 60 tablet 11  . clopidogrel (PLAVIX) 75 MG tablet Take 1 tablet (75 mg total) by mouth daily. 30 tablet 5  . famotidine (PEPCID) 20 MG  tablet Take 20 mg by mouth 2 (two) times daily.    . fexofenadine (ALLEGRA) 180 MG tablet Take 180 mg by mouth daily.     . furosemide (LASIX) 40 MG tablet Take 1 tablet (40 mg total) by mouth 2 (two) times daily. 60 tablet 11  . isosorbide mononitrate (IMDUR) 60 MG 24 hr tablet TAKE 1 AND 1/2 TABLET (90 MG TOTAL) BY MOUTH DAILY. 45 tablet 11  . KLOR-CON M20 20 MEQ tablet TAKE 1 TABLET (20MEQ) BY MOUTH DAILY. 30 tablet 6  . nitroGLYCERIN (NITROSTAT) 0.4 MG SL tablet TAKE 1 TABLET UNDER THE TONGUE AS NEEDEDFOR CHEST PAIN ( MAY REPEAT EVERY 5 MINUTES X 3) 25 tablet 3  . pantoprazole (PROTONIX) 40 MG tablet Take 40 mg by mouth daily as needed (GI upset). fo    . spironolactone (ALDACTONE) 25 MG tablet TAKE 1/2 TABLET BY MOUTH TWICE A DAY. 30 tablet 4   No current facility-administered medications for this visit.    No Known Allergies  Social History   Social History  . Marital Status: Married    Spouse Name: N/A  . Number of Children: 1  . Years of Education: N/A   Occupational History  . Plumbing     Retired in 2007   Social History  Main Topics  . Smoking status: Current Every Day Smoker -- 1.00 packs/day for 57 years    Types: Cigarettes  . Smokeless tobacco: Never Used  . Alcohol Use: 3.0 oz/week    5 Cans of beer per week     Comment: daily-6 daily  . Drug Use: No  . Sexual Activity: Not on file   Other Topics Concern  . Not on file   Social History Narrative   Lives in [place] with his family.  Does not routinely exercise.    Family History  Problem Relation Age of Onset  . Heart attack Mother     died @ 95.  Marland Kitchen Hypertension Sister   . Emphysema Brother   . Stroke Neg Hx   . Hypertension Mother   . Hypertension Father   . Hypertension Brother   . Allergies Sister     Review of Systems:  As stated in the HPI and otherwise negative.   BP 140/78 mmHg  Pulse 61  Ht 5\' 5"  (1.651 m)  Wt 152 lb (68.947 kg)  BMI 25.29 kg/m2  SpO2 98%  Physical Examination: General: Well developed, well nourished, NAD HEENT: OP clear, mucus membranes moist SKIN: warm, dry. No rashes. Neuro: No focal deficits Musculoskeletal: Muscle strength 5/5 all ext Psychiatric: Mood and affect normal Neck: No JVD, no carotid bruits, no thyromegaly, no lymphadenopathy. Lungs:Clear bilaterally, no wheezes, rhonci, crackles Cardiovascular: Regular rate and rhythm. Systolic murmur noted. No gallops or rubs. Abdomen:Soft. Bowel sounds present. Non-tender.  Extremities: No lower extremity edema. Pulses are 1 + in the bilateral DP/PT.  Cardiac cath April 2016:  Left main: Long segment with stent noted throughout the vessel. The stented segment is patent with mild diffuse 20% restenosis.  Left Anterior Descending Artery: Large caliber vessel that courses to the apex. There is 100% ostial occlusion. The mid and distal vessel fills from the patent IMA graft.  Circumflex Artery: Large caliber vessel with stent extending from the ostium into the OM1. There is diffuse 30% restenosis in the stented segment. The mid and distal segment of  the has mild diffuse disease. The AV groove Circumflex is occluded and fills from left to left collaterals.  Right  Coronary Artery: Known to be occluded at the ostium. (not engaged). The distal vessel fills from left to right collaterals supplied by the Circumflex and the LAD.  Graft Anatomy:  SVG to PDA is known to be occluded and is not selectively engaged.  SVG to OM is known to be occluded and is not selectively engaged.  LIMA to LAD is patent. The stent in the mid LAD beyond the graft insertion is patent with minimal restenosis.  Left Ventricular Angiogram: LVEF=35-40%. Global hypokinesis with more severe hypokinesis in the inferior wall.   Echo 03/21/15: Left ventricle: Severe hypokinesis of base inferoseptal segment and base/mid inferior segments. The cavity size was mildly dilated. Wall thickness was normal. Systolic function was mildly reduced. The estimated ejection fraction was in the range of 45% to 50%. - Aortic valve: There was moderate stenosis. There was mild regurgitation. Mean gradient (S): 18 mm Hg. Peak gradient (S): 33 mm Hg. - Left atrium: The atrium was moderately dilated. - Right ventricle: The cavity size was normal. Systolic function was mildly reduced. - Right atrium: The atrium was mildly dilated  EKG:  EKG is ordered today. The ekg ordered today demonstrates NSR, IVCD. Non-specific ST abnormalty.   Recent Labs: 05/01/2015: BNP 407.2* 08/18/2015: ALT 12 09/01/2015: BUN 25; Creatinine, Ser 1.41*; Potassium 4.4; Sodium 141   Lipid Panel    Component Value Date/Time   CHOL 137 08/18/2015 1056   CHOL 146 03/31/2015 0905   TRIG 128 08/18/2015 1056   HDL 42 08/18/2015 1056   HDL 42 03/31/2015 0905   CHOLHDL 3.3 08/18/2015 1056   VLDL 26 08/18/2015 1056   LDLCALC 69 08/18/2015 1056   LDLCALC 77 03/31/2015 0905     Wt Readings from Last 3 Encounters:  04/09/16 152 lb (68.947 kg)  08/20/15 151 lb (68.493 kg)  06/27/15 149 lb (67.586 kg)       Other studies Reviewed: Additional studies/ records that were reviewed today include:  Review of the above records demonstrates:    Assessment and Plan:   1. CAD with unstable angina: He is s/p CABG 1993 and most recent cath June 2016 with stable anatomy with patent IMA to LAD, occlusion of both vein grafts to the PDA and Circumflex, patent left main to Circumflex stent, left to right collaterals filling RCA. He is compliant with his medications but refuses to stop smoking which is contributing to progression of CAD. He understands this. With recent changes worrisome for unstable angina, will plan right and left heart catheterization at [facility] July 12,2017. Pre cath labs week of case. Risks and benefits reviewed and he agrees to proceed. Continue current meds.   2. CAROTID ARTERY DISEASE: The patient had Dopplers done in the fall of 2013 and does not have significant carotid disease   3. Hyperlipidemia: Continue statin. Lipids controlled.   4. Aortic valve disease: Moderate AS by echo June 2016. Will repeat echo now.   5. HTN: BP controlled. No changes.   6. Tobacco abuse: Smoking cessation encouraged. He does not wish to stop  7. Ischemic cardiomyopathy/Chronic systolic CHF: Continue meds including Lasix and aldactone, beta blocker. Volume status is ok. Last LvEF=45-50% by echo June 2016.  Current medicines are reviewed at length with the patient today.  The patient does not have concerns regarding medicines.  The following changes have been made:  Lasix dose reduced to 40 mg daily  Labs/ tests ordered today include:   Orders Placed This Encounter  Procedures  . Basic Metabolic Panel (  BMET)  . CBC  . INR/PT  . EKG 12-Lead  . ECHO COMPLETE    Disposition:   FU with me after cath s  Signed, Lauree Chandler, MD 04/09/2016 12:48 PM    [address], [place], [place]  [address] Phone: [phone]; Fax: [phone]

## 2016-04-28 NOTE — Discharge Instructions (Signed)
Angiogram, Care After °Refer to this sheet in the next few weeks. These instructions provide you with information about caring for yourself after your procedure. Your health care provider may also give you more specific instructions. Your treatment has been planned according to current medical practices, but problems sometimes occur. Call your health care provider if you have any problems or questions after your procedure. °WHAT TO EXPECT AFTER THE PROCEDURE °After your procedure, it is typical to have the following: °· Bruising at the catheter insertion site that usually fades within 1-2 weeks. °· Blood collecting in the tissue (hematoma) that may be painful to the touch. It should usually decrease in size and tenderness within 1-2 weeks. °HOME CARE INSTRUCTIONS °· Take medicines only as directed by your health care provider. °· You may shower 24-48 hours after the procedure or as directed by your health care provider. Remove the bandage (dressing) and gently wash the site with plain soap and water. Pat the area dry with a clean towel. Do not rub the site, because this may cause bleeding. °· Do not take baths, swim, or use a hot tub until your health care provider approves. °· Check your insertion site every day for redness, swelling, or drainage. °· Do not apply powder or lotion to the site. °· Do not lift over 10 lb (4.5 kg) for 5 days after your procedure or as directed by your health care provider. °· Ask your health care provider when it is okay to: °¨ Return to work or school. °¨ Resume usual physical activities or sports. °¨ Resume sexual activity. °· Do not drive home if you are discharged the same day as the procedure. Have someone else drive you. °· You may drive 24 hours after the procedure unless otherwise instructed by your health care provider. °· Do not operate machinery or power tools for 24 hours after the procedure or as directed by your health care provider. °· If your procedure was done as an  outpatient procedure, which means that you went home the same day as your procedure, a responsible adult should be with you for the first 24 hours after you arrive home. °· Keep all follow-up visits as directed by your health care provider. This is important. °SEEK MEDICAL CARE IF: °· You have a fever. °· You have chills. °· You have increased bleeding from the catheter insertion site. Hold pressure on the site. °SEEK IMMEDIATE MEDICAL CARE IF: °· You have unusual pain at the catheter insertion site. °· You have redness, warmth, or swelling at the catheter insertion site. °· You have drainage (other than a small amount of blood on the dressing) from the catheter insertion site. °· The catheter insertion site is bleeding, and the bleeding does not stop after 30 minutes of holding steady pressure on the site. °· The area near or just beyond the catheter insertion site becomes pale, cool, tingly, or numb. °  °This information is not intended to replace advice given to you by your health care provider. Make sure you discuss any questions you have with your health care provider. °  °Document Released: 04/22/2005 Document Revised: 10/25/2014 Document Reviewed: 03/07/2013 °Elsevier Interactive Patient Education ©2016 [employer] ° °

## 2016-04-28 NOTE — Interval H&P Note (Signed)
History and Physical Interval Note:  04/28/2016 10:19 AM  Steve Durham  has presented today for cardiac cath with the diagnosis of unstable angina/CAD. The various methods of treatment have been discussed with the patient and family. After consideration of risks, benefits and other options for treatment, the patient has consented to  Procedure(s): Right/Left Heart Cath and Coronary/Graft Angiography (N/A) as a surgical intervention .  The patient's history has been reviewed, patient examined, no change in status, stable for surgery.  I have reviewed the patient's chart and labs.  Questions were answered to the patient's satisfaction.    Cath Lab Visit (complete for each Cath Lab visit)  Clinical Evaluation Leading to the Procedure:   ACS: No.  Non-ACS:    Anginal Classification: CCS III  Anti-ischemic medical therapy: Maximal Therapy (2 or more classes of medications)  Non-Invasive Test Results: No non-invasive testing performed  Prior CABG: Previous CABG         Lauree Chandler

## 2016-04-29 ENCOUNTER — Encounter (HOSPITAL_COMMUNITY): Payer: Self-pay | Admitting: Cardiovascular Disease

## 2016-04-29 MED FILL — Verapamil HCl IV Soln 2.5 MG/ML: INTRAVENOUS | Qty: 2 | Status: AC

## 2016-05-18 ENCOUNTER — Other Ambulatory Visit: Payer: Self-pay

## 2016-05-18 MED ORDER — BISOPROLOL FUMARATE 5 MG PO TABS: 2.5000 mg | ORAL_TABLET | Freq: Two times a day (BID) | ORAL | 1 refills | Status: DC

## 2016-05-18 NOTE — Telephone Encounter (Signed)
Tej Ray Turko  Burnell Blanks, MD at 04/09/2016 7:58 AM  bisoprolol (ZEBETA) 5 MG tablet Take 0.5 tablets (2.5 mg total) by mouth 2 (two) times daily    Current medicines are reviewed at length with the patient today.  The patient does not have concerns regarding medicines.  The following changes have been made:  Lasix dose reduced to 40 mg daily

## 2016-05-20 ENCOUNTER — Other Ambulatory Visit: Payer: Self-pay | Admitting: Physician Assistant

## 2016-05-20 DIAGNOSIS — R0602 Shortness of breath: Secondary | ICD-10-CM

## 2016-05-20 DIAGNOSIS — I25119 Atherosclerotic heart disease of native coronary artery with unspecified angina pectoris: Secondary | ICD-10-CM

## 2016-05-20 DIAGNOSIS — E785 Hyperlipidemia, unspecified: Secondary | ICD-10-CM

## 2016-05-23 NOTE — Progress Notes (Signed)
Cardiology Office Note:    Date:  05/24/2016   ID:  Steve Durham, DOB 1946-06-25, MRN [id number]  PCP:  No PCP Per Patient  Cardiologist:  Dr. Lauree Chandler   Electrophysiologist:  n/a  Referring MD: No ref. provider found   Chief Complaint  Patient presents with  . Follow-up    CAD s/p cath   History of Present Illness:    Steve Durham is a 70 y.o. male with a hx of CAD s/p prior CABG 1993 and inf MI in 02/2014 with LM stenting (both SVGs known to be occluded), ICM, systolic CHF, aortic stenosis, HTN, HL. Turned down for redo CABG in 2013.   Admitted in 07/2014 with a non-STEMI. LHC demonstrated high-grade lesion proximal OM1 and high-grade in-stent restenosis in the distal portion of the left main stent. He underwent DES placement to the proximal OM1 and balloon angioplasty to the left main. EF was 25% at cardiac catheterization. Follow-up echocardiogram demonstrated an EF of 45% and mild aortic stenosis.  In 01/2015 he underwent LHC 2/2 unstable angina.  LHC demonstrated patent stents in the left main, ostial and proximal Circumflex and mid LAD beyond graft insertion. Medical Rx was recommended.    Admitted in 02/2015 with non-STEMI and a/c systolic HF in the setting of probable bronchitis. He had new EKG changes and Troponin peaked at 1.38. LHC demonstrated stable anatomy. LVEDP was elevated and he was diuresed.  Echocardiogram demonstrated EF 45-50% with inferoseptal and inferior hypokinesis, moderate aortic stenosis with mean gradient 18 mmHg, biatrial enlargement, mildly reduced RVSF. Of note, his ACE inhibitor was stopped due to worsening creatinine.  Last seen by Dr. Angelena Form 6/17. He complained of symptoms suggestive of worsening angina. LHC demonstrated severe 3 vessel CAD. His LIMA-LAD. Ostial RCA was chronically occluded. LM stent was patent. LAD was chronically occluded.  There were no targets for PCI and med rx was recommended.  FU echo in 7/17 demonstrated  normal LVEF, mild diastolic dysfunction, mod AS (mean 20 mmHg), PASP 45 mmHg.  He returns for follow-up. He is overall doing well. He continues to be short of breath. He also notes wheezing as well as significant a.m. cough. He has chronic chest discomfort without change. He denies syncope. He sometimes has to sleep sitting up. He denies LE edema.  Prior CV studies that were reviewed today include:    Echo 04/27/16 EF 55-60%, normal wall motion, grade 1 diastolic dysfunction, moderate aortic stenosis, mean gradient 20 mmHg, MAC, PASP 45 mmHg  LHC 04/28/16 1. Severe triple vessel CAD s/p 3V CABG. The only patent graft is the LIMA to the LAD.  2. Chronic occlusion ostial RCA (not injected). The distal RCA/PDA fills from left to right collaterals from the LAD and from the Circumflex.  3. Patent left main stent into the proximal Circumflex and first obtuse marginal branch. There is mild restenosis in this stented segment but it does not appear to be flow limiting.  4. Chronic occlusion proximal LAD. The mid and distal LAD fills from the patent LIMA graft. The stent from the LAD into the LIMA graft is patent without restenosis.  5. Mild LV systolic dysfunction by LV gram. (Echo yesterday with normal LV function. ) 6. NO significant gradient across the aortic valve.  Recommendations: Continue medical management of CAD. I suspect that his worsened dyspnea is related to COPD. He has been smoking several packs per day for 60 years. Right sided pressures normal today. Smoking cessation is encouraged.  Echo 03/21/15 - Left ventricle: Severe hypokinesis of base inferoseptal segmentand base/mid inferior segments. The cavity size was mildlydilated. Wall thickness was normal. Systolic function was mildlyreduced. The estimated ejection fraction was in the range of 45%to 50%. - Aortic valve: There was moderate stenosis. There was mildregurgitation. Mean gradient (S): 18 mm Hg. Peak gradient (S): 33 mm  Hg. - Left atrium: The atrium was moderately dilated. - Right ventricle: The cavity size was normal. Systolic functionwas mildly reduced. - Right atrium: The atrium was mildly dilated.  LHC 03/20/15  Severe three-vessel coronary artery disease.  Patent stent from the left main into the circumflex system.  LAD proximal occluded  Patent LIMA to LAD. Patent stent in the LAD.  RCA: Ostial/proximal occluded with left-to-right collaterals  Elevated LVEDP of 36 mm Hg. This is likely contributing to his shortness of breath and cough . IV Lasix given in the Cath Lab due to elevated LVEDP and cough which seemed to be getting worse during the procedure. He'll need his renal function followed closely. Will transfer to a stepdown bed so that he can be monitored more closely. Continue aggressive secondary prevention. Continue dual antiplatelet therapy.   LHC 02/12/15 LM: stent patent with mild diffuse 20% restenosis.  LAD:  Ostial 100%  LCx:  30% restenosis in the stented segment which extends from ostium into OM1. AVCFX occluded and fills L-L collats RCA:  Known to be occluded at the ostium. (not engaged). The distal vessel fills L-R collats  SVG to PDA is known to be occluded  SVG to OM is known to be occluded  LIMA to LAD is patent. The stent in the mid LAD beyond the graft insertion is patent with minimal restenosis.  LVEF=35-40%. Global hypokinesis with more severe hypokinesis in the inferior wall.    Echo 08/02/14 - Severe hypokinesis base/mid-inferolateralsegments and severe hypokinesis base inferior segment. EF is 45%. Mild LVH. - Mild AS.  The AS may be underestimated because of the LV dysfunction. Meangradient (S): 14 mm Hg. Peak gradient (S): 25 mm Hg. VTI ratio ofLVOT to aortic valve: 0.37. - Left atrium: The atrium was moderately dilated. - Right ventricle: The cavity size was normal. Systolic functionwas mildly reduced.   Carotid US (10/13):  No sig ICA  stenosis  Past Medical History:  Diagnosis Date  . Abnormal CT scan, kidney    07/2012 - multiple small cysts  . Aortic stenosis    a. Mod AS/AI by cath 07/2012.;  b.  Echo (07/31/13) is: EF 55-60%, mild aortic stenosis (mean gradient 13);  c. Echo (5/15):  EF 40-45%, mild AS (mean 12 mmHg), mod AI  . Back pain   . Carotid artery occlusion    a. Dopplers 07/2012: no significant high grade obstruction.  . Cholelithiasis    Seen on CT 07/2012  . Coronary artery disease    a. CABG 10/1991. b. cath 07/2012 with prog dsz, turned down for re-do CABG - for med rx for now.; c. NSTEMI/PCI: DES to mLAD ext into IMA graft;  d. Inf STEMI (5/15):  LM 60-70% then 99% before LAD, pLAD occl, pCFX stent 80+% ISR, oRCA 99% then occl (L-R collats to dRCA), L-LAD ok with patent stent, S-CFX occl (old), S-RCA occl (old); PCI: LM ext into CFX with Xience Alpine DES  . Heart failure (HCC) systolic  . Hyperlipidemia   . Hyperlipoproteinemia   . Hypertension   . Ischemic cardiomyopathy    a. 2D ECHO: 08/02/2014; EF 45%; severe hypokinesis base/mid-inferolat segments  and severe hypokinesis base inferior segment. Mild LVH, mild AS (may be underestimated due to LV dysfunction).  Mean gradient (S): 14 mm Hg. Peak gradient (S): 25 mm Hg. VTI ratio of LVOT to aortic valve: 0.37. Mod LA dilation. Mild RV systolic dysfxn     Past Surgical History:  Procedure Laterality Date  . CARDIAC CATHETERIZATION  04/03/99  . CARDIAC CATHETERIZATION N/A 03/20/2015   Procedure: Left Heart Cath and Cors/Grafts Angiography;  Surgeon: Jettie Booze, MD;  Location: [facility];  Service: Cardiovascular;  Laterality: N/A;  . CARDIAC CATHETERIZATION N/A 04/28/2016   Procedure: Right/Left Heart Cath and Coronary/Graft Angiography;  Surgeon: Burnell Blanks, MD;  Location: [facility];  Service: Cardiovascular;  Laterality: N/A;  . CARPAL TUNNEL RELEASE  2007   Excision mass dorsal left wrist  . CORONARY ARTERY  BYPASS GRAFT  1993  . FASCIOTOMY Right 07/30/2013   Procedure: OPEN FASCIOTOMY RIGHT RING FINGER & RIGHT SMALL FINGER MULTIPLE LEVELS;  Surgeon: Wynonia Sours, MD;  Location: [facility];  Service: Orthopedics;  Laterality: Right;  . HERNIA REPAIR  1988  . LEFT HEART CATHETERIZATION WITH CORONARY ANGIOGRAM N/A 02/28/2014   Procedure: LEFT HEART CATHETERIZATION WITH CORONARY ANGIOGRAM;  Surgeon: Troy Sine, MD;  Location: [facility];  Service: Cardiovascular;  Laterality: N/A;  . LEFT HEART CATHETERIZATION WITH CORONARY/GRAFT ANGIOGRAM N/A 08/10/2012   Procedure: LEFT HEART CATHETERIZATION WITH Beatrix Fetters;  Surgeon: Hillary Bow, MD;  Location: [facility] CATH LAB;  Service: Cardiovascular;  Laterality: N/A;  . LEFT HEART CATHETERIZATION WITH CORONARY/GRAFT ANGIOGRAM N/A 08/01/2014   Procedure: LEFT HEART CATHETERIZATION WITH Beatrix Fetters;  Surgeon: Leonie Man, MD;  Location: [facility] CATH LAB;  Service: Cardiovascular;  Laterality: N/A;  . LEFT HEART CATHETERIZATION WITH CORONARY/GRAFT ANGIOGRAM N/A 02/12/2015   Procedure: LEFT HEART CATHETERIZATION WITH Beatrix Fetters;  Surgeon: Burnell Blanks, MD;  Location: [facility] CATH LAB;  Service: Cardiovascular;  Laterality: N/A;  . PERCUTANEOUS CORONARY STENT INTERVENTION (PCI-S)  07/31/2013   Procedure: PERCUTANEOUS CORONARY STENT INTERVENTION (PCI-S);  Surgeon: Burnell Blanks, MD;  Location: [facility] CATH LAB;  Service: Cardiovascular;;  LIMA to LAD at the anastomosis with aortic root shot     Current Medications: Outpatient Medications Prior to Visit  Medication Sig Dispense Refill  . amLODipine (NORVASC) 5 MG tablet Take 1 tablet (5 mg total) by mouth daily. 30 tablet 5  . aspirin EC 81 MG tablet Take 81 mg by mouth at bedtime.    Marland Kitchen atorvastatin (LIPITOR) 80 MG tablet TAKE ONE (1) TABLET EACH DAY 30 tablet 9  . bisoprolol (ZEBETA) 5 MG tablet Take 0.5 tablets (2.5 mg total) by mouth 2 (two) times  daily. 180 tablet 1  . clopidogrel (PLAVIX) 75 MG tablet Take 1 tablet (75 mg total) by mouth daily. 30 tablet 5  . famotidine (PEPCID) 20 MG tablet Take 20 mg by mouth 2 (two) times daily.    . fexofenadine (ALLEGRA) 180 MG tablet Take 180 mg by mouth daily as needed for allergies.     . furosemide (LASIX) 40 MG tablet TAKE ONE TABLET BY MOUTH TWICE DAILY 60 tablet 9  . isosorbide mononitrate (IMDUR) 60 MG 24 hr tablet TAKE 1 AND 1/2 TABLET (90 MG TOTAL) BY MOUTH DAILY. 45 tablet 11  . KLOR-CON M20 20 MEQ tablet TAKE 1 TABLET (20MEQ) BY MOUTH DAILY. 30 tablet 6  . nitroGLYCERIN (NITROSTAT) 0.4 MG SL tablet TAKE 1 TABLET UNDER THE TONGUE AS  NEEDEDFOR CHEST PAIN ( MAY REPEAT EVERY 5 MINUTES X 3) 25 tablet 3  . pantoprazole (PROTONIX) 40 MG tablet Take 40 mg by mouth daily as needed (GI upset). fo    . spironolactone (ALDACTONE) 25 MG tablet TAKE 1/2 TABLET BY MOUTH TWICE A DAY. 30 tablet 4   No facility-administered medications prior to visit.       Allergies:   Review of patient's allergies indicates no known allergies.   Social History   Social History  . Marital status: Married    Spouse name: N/A  . Number of children: 1  . Years of education: N/A   Occupational History  . Plumbing     Retired in 2007   Social History Main Topics  . Smoking status: Current Every Day Smoker    Packs/day: 1.00    Years: 57.00    Types: Cigarettes  . Smokeless tobacco: Never Used  . Alcohol use 3.0 oz/week    5 Cans of beer per week     Comment: daily-6 daily  . Drug use: No  . Sexual activity: Not Asked   Other Topics Concern  . None   Social History Narrative   Lives in [place] with his family.  Does not routinely exercise.     Family History:  The patient's family history includes Allergies in his sister; Emphysema in his brother; Heart attack in his mother; Hypertension in his brother, father, mother, and sister.   ROS:   Please see the history of present illness.    ROS All  other systems reviewed and are negative.   EKGs/Labs/Other Test Reviewed:    EKG:  EKG is  ordered today.  The ekg ordered today demonstrates NSR, HR 61, normal axis, IVCD, QTc 448 ms, nonspecific ST-T wave changes, no change since prior tracing  Recent Labs: 08/18/2015: ALT 12 04/27/2016: BUN 22; Creat 1.27; Hemoglobin 14.7; Platelets 230; Potassium 4.8; Sodium 138   Recent Lipid Panel    Component Value Date/Time   CHOL 137 08/18/2015 1056   CHOL 146 03/31/2015 0905   TRIG 128 08/18/2015 1056   HDL 42 08/18/2015 1056   HDL 42 03/31/2015 0905   CHOLHDL 3.3 08/18/2015 1056   VLDL 26 08/18/2015 1056   LDLCALC 69 08/18/2015 1056   LDLCALC 77 03/31/2015 0905    Physical Exam:    VS:  BP 130/62   Pulse 60   Ht 5\' 5"  (1.651 m)   Wt 150 lb 12.8 oz (68.4 kg)   SpO2 98%   BMI 25.09 kg/m     Wt Readings from Last 3 Encounters:  05/24/16 150 lb 12.8 oz (68.4 kg)  04/28/16 151 lb (68.5 kg)  04/09/16 152 lb (68.9 kg)     Physical Exam  Constitutional: He is oriented to person, place, and time. He appears well-developed and well-nourished. No distress.  HENT:  Head: Normocephalic and atraumatic.  Eyes: No scleral icterus.  Neck: Normal range of motion. No JVD present.  Cardiovascular: Normal rate, regular rhythm, S1 normal and S2 normal.  Exam reveals no gallop and no friction rub.   Murmur heard.  Harsh systolic murmur is present with a grade of 2/6  at the upper right sternal border Pulmonary/Chest: Effort normal. He has decreased breath sounds. He has wheezes. He has no rhonchi. He has no rales.  Abdominal: Soft. He exhibits no distension and no mass. There is no tenderness.  Musculoskeletal: Normal range of motion. He exhibits no edema.  R groin without  hematoma or bruit    Lymphadenopathy:    He has no cervical adenopathy.  Neurological: He is alert and oriented to person, place, and time.  Skin: Skin is warm and dry.  Psychiatric: He has a normal mood and affect.     ASSESSMENT:    1. Coronary artery disease involving native coronary artery of native heart with angina pectoris ([place])   2. Chronic diastolic CHF (congestive heart failure) ([place])   3. Ischemic cardiomyopathy   4. Essential hypertension   5. Hyperlipidemia   6. Tobacco abuse   7. Aortic stenosis    8. Chronic obstructive pulmonary disease, unspecified COPD type ([place])    PLAN:    In order of problems listed above:  1. CAD - He is s/p CABG in 1993, inf MI in 5/15 (both SVGs occluded) tx with stenting to LM, NSTEMI in 10/15 tx with DES to pOM1 and POBA to LM stent. R/L HC in 7/17 demonstrates stable anatomy with patent LM stent, patent L-LAD, patent OM1 stent, CTO of RCA with L-R collaterals.  He is tx medically.  His symptoms are stable.  Continue ASA, Plavix, Amlodipine, beta blocker, nitrates.  He could not afford Ranexa.  2. Chronic diastolic CHF - Volume appears stable.  LVEDP during R/L heart cath was normal.  3. Ischemic CM - EF was previously 25%. Recent Echo with normal EF.  Continue current regimen which includes beta blocker, nitrates, spironolactone.  He is not on ACEI due to worsening creatinine.    4. HTN - BP controlled.  5. HL - Continue Atorvastatin 80.  6. Tobacco abuse - I have again advised him to quit.   7. Aortic stenosis - Mod by recent echo.  8. COPD - Given results of his cardiac cath, suspect his symptoms are related to COPD.  I have asked him to FU with Pulmonology.  I have given him a Rx for Albuterol prn.   Medication Adjustments/Labs and Tests Ordered: Current medicines are reviewed at length with the patient today.  Concerns regarding medicines are outlined above.  Medication changes, Labs and Tests ordered today are outlined in the Patient Instructions noted below. Patient Instructions  Medication Instructions:  1. START ALBUTEROL INHALER 1-2 PUFFS EVERY 4-6 HOURS AS NEEDED Labwork: NONE Testing/Procedures: NONE Follow-Up: DR. Angelena Form IN 4  MONTHS  Any Other Special Instructions Will Be Listed Below (If Applicable). PER Sarra Rachels, PAC TO CALL YOUR PULMONOLOGY OFFICE FOR AN FOLLOW UP APPT. If you need a refill on your cardiac medications before your next appointment, please call your pharmacy.  Signed, Richardson Dopp, PA-C  05/24/2016 5:21 PM    [facility] [address], [place], [place]  [address] Phone: [phone]; Fax: [phone]

## 2016-05-24 ENCOUNTER — Ambulatory Visit (INDEPENDENT_AMBULATORY_CARE_PROVIDER_SITE_OTHER): Payer: Medicare Other | Admitting: Physician Assistant

## 2016-05-24 ENCOUNTER — Ambulatory Visit: Payer: Medicare Other | Admitting: Physician Assistant

## 2016-05-24 ENCOUNTER — Encounter (INDEPENDENT_AMBULATORY_CARE_PROVIDER_SITE_OTHER): Payer: Self-pay

## 2016-05-24 ENCOUNTER — Encounter: Payer: Self-pay | Admitting: Physician Assistant

## 2016-05-24 VITALS — BP 130/62 | HR 60 | Ht 65.0 in | Wt 150.8 lb

## 2016-05-24 DIAGNOSIS — I25119 Atherosclerotic heart disease of native coronary artery with unspecified angina pectoris: Secondary | ICD-10-CM | POA: Diagnosis not present

## 2016-05-24 DIAGNOSIS — I255 Ischemic cardiomyopathy: Secondary | ICD-10-CM

## 2016-05-24 DIAGNOSIS — I10 Essential (primary) hypertension: Secondary | ICD-10-CM

## 2016-05-24 DIAGNOSIS — I5032 Chronic diastolic (congestive) heart failure: Secondary | ICD-10-CM | POA: Diagnosis not present

## 2016-05-24 DIAGNOSIS — I200 Unstable angina: Secondary | ICD-10-CM

## 2016-05-24 DIAGNOSIS — I350 Nonrheumatic aortic (valve) stenosis: Secondary | ICD-10-CM

## 2016-05-24 DIAGNOSIS — Z720 Tobacco use: Secondary | ICD-10-CM

## 2016-05-24 DIAGNOSIS — E785 Hyperlipidemia, unspecified: Secondary | ICD-10-CM

## 2016-05-24 DIAGNOSIS — J449 Chronic obstructive pulmonary disease, unspecified: Secondary | ICD-10-CM

## 2016-05-24 MED ORDER — ALBUTEROL SULFATE HFA 108 (90 BASE) MCG/ACT IN AERS: 2.0000 | INHALATION_SPRAY | Freq: Four times a day (QID) | RESPIRATORY_TRACT | 0 refills | Status: DC | PRN

## 2016-05-24 NOTE — Patient Instructions (Addendum)
Medication Instructions:  1. START ALBUTEROL INHALER 1-2 PUFFS EVERY 4-6 HOURS AS NEEDED Labwork: NONE Testing/Procedures: NONE Follow-Up: DR. Angelena Form IN 4 MONTHS  Any Other Special Instructions Will Be Listed Below (If Applicable). PER SCOTT WEAVER, PAC TO CALL YOUR PULMONOLOGY OFFICE FOR AN FOLLOW UP APPT. If you need a refill on your cardiac medications before your next appointment, please call your pharmacy.

## 2016-07-01 ENCOUNTER — Other Ambulatory Visit: Payer: Self-pay | Admitting: *Deleted

## 2016-07-01 MED ORDER — POTASSIUM CHLORIDE CRYS ER 20 MEQ PO TBCR: EXTENDED_RELEASE_TABLET | ORAL | 3 refills | Status: DC

## 2016-07-22 ENCOUNTER — Other Ambulatory Visit: Payer: Self-pay | Admitting: Cardiovascular Disease

## 2016-07-27 ENCOUNTER — Other Ambulatory Visit: Payer: Self-pay | Admitting: Cardiovascular Disease

## 2016-08-04 DIAGNOSIS — L570 Actinic keratosis: Secondary | ICD-10-CM | POA: Diagnosis not present

## 2016-08-04 DIAGNOSIS — L578 Other skin changes due to chronic exposure to nonionizing radiation: Secondary | ICD-10-CM | POA: Diagnosis not present

## 2016-08-09 DIAGNOSIS — Z23 Encounter for immunization: Secondary | ICD-10-CM | POA: Diagnosis not present

## 2016-08-09 DIAGNOSIS — H6121 Impacted cerumen, right ear: Secondary | ICD-10-CM | POA: Diagnosis not present

## 2016-08-09 DIAGNOSIS — H6690 Otitis media, unspecified, unspecified ear: Secondary | ICD-10-CM | POA: Diagnosis not present

## 2016-08-24 ENCOUNTER — Other Ambulatory Visit: Payer: Self-pay | Admitting: Cardiovascular Disease

## 2016-08-24 NOTE — Telephone Encounter (Signed)
spironolactone (ALDACTONE) 25 MG tablet  Medication  Date: 07/27/2016 Department: [address] Office Ordering/Authorizing: Burnell Blanks, MD  Order Providers   Prescribing Provider Encounter Provider  Burnell Blanks, MD Burnell Blanks, MD  Medication Detail    Disp Refills Start End   spironolactone (ALDACTONE) 25 MG tablet 90 tablet 3 07/27/2016    Sig: TAKE 1/2 TABLET BY MOUTH TWICE A DAY   E-Prescribing Status: Receipt confirmed by pharmacy (07/27/2016 1:34 PM EDT)   Pharmacy   [place], [place] - [place].

## 2016-09-20 ENCOUNTER — Other Ambulatory Visit: Payer: Self-pay | Admitting: Cardiovascular Disease

## 2016-09-23 NOTE — Progress Notes (Signed)
Chief Complaint  Patient presents with  . Follow-up    History of Present Illness: 70 yo male with history of CAD s/p CABG in 1993, HTN, HLD, aortic stenosis here today for follow up. Admitted October 2013 and cath performed with occlusion of LAD, patent IMA to LAD, occluded proximal RCA with occluded SVG to PDA, severe disease in left main into Circumflex with occluded vein graft to Circumflex. Re-do CABG suggested but his aorta was severely calcified. He was seen by Dr. Servando Snare with CT surgery and not felt to be a candidate for re-do bypass. Imdur was added. No intervention performed. He was admitted October 2014 with a NSTEMI. Cardiac cath with 70% left main stenosis, LAD occluded, mid LAD 99 after anastomosis of the graft, prox Circumflex 50% , OM stent with 70% ISR, pRCA occluded, SVG-PDA occluded, SVG-OM occluded, LIMA-LAD patent. A Promus premier DES was placed in the mid LAD extending back into the IMA graft. He was readmitted May 2015 with an inferolateral STEMI and found to have progression of disease in the left main/proximal Circumflex stent. A 3.0 x 28 mm Xience DES was placed from the Circumflex back into the left main. He was readmitted October 2015 with unstable angina. Cardiac cath with stenosis left main stent and OM 1. 3.0 x 23 mm Xience DES placed OM1. The left main stent restenosis treated with Estero balloon. I saw him April 2016 and he had symptoms c/w unstable angina. Cardiac cath April 2016 with stable disease. Admitted to [facility] May 2016 with NSTEMI in setting of bronchitis. Cardiac cath June 2016 with stable disease. He continues to smoke. He is followed in Pulmonary but surprisingly as told he had very mild COPD. He has been tried on Ranexa but stopped due to cost. Last cath July 2017 with stable disease. Echo July 2017 with LVEF=55-60%. Moderate AS.   He is here today for follow up. He has no chest pain. No dyspnea. No leg pain. Still smoking and states that it is the only thing  he enjoys in life and will not stop.   Primary Care Physician: No PCP Per Patient   Past Medical History:  Diagnosis Date  . Abnormal CT scan, kidney    07/2012 - multiple small cysts  . Aortic stenosis    a. Mod AS/AI by cath 07/2012.;  b.  Echo (07/31/13) is: EF 55-60%, mild aortic stenosis (mean gradient 13);  c. Echo (5/15):  EF 40-45%, mild AS (mean 12 mmHg), mod AI  . Back pain   . Carotid artery occlusion    a. Dopplers 07/2012: no significant high grade obstruction.  . Cholelithiasis    Seen on CT 07/2012  . Coronary artery disease    a. CABG 10/1991. b. cath 07/2012 with prog dsz, turned down for re-do CABG - for med rx for now.; c. NSTEMI/PCI: DES to mLAD ext into IMA graft;  d. Inf STEMI (5/15):  LM 60-70% then 99% before LAD, pLAD occl, pCFX stent 80+% ISR, oRCA 99% then occl (L-R collats to dRCA), L-LAD ok with patent stent, S-CFX occl (old), S-RCA occl (old); PCI: LM ext into CFX with Xience Alpine DES  . Heart failure (HCC) systolic  . Hyperlipidemia   . Hyperlipoproteinemia   . Hypertension   . Ischemic cardiomyopathy    a. 2D ECHO: 08/02/2014; EF 45%; severe hypokinesis base/mid-inferolat segments and severe hypokinesis base inferior segment. Mild LVH, mild AS (may be underestimated due to LV dysfunction).  Mean gradient (S): 14  mm Hg. Peak gradient (S): 25 mm Hg. VTI ratio of LVOT to aortic valve: 0.37. Mod LA dilation. Mild RV systolic dysfxn     Past Surgical History:  Procedure Laterality Date  . CARDIAC CATHETERIZATION  04/03/99  . CARDIAC CATHETERIZATION N/A 03/20/2015   Procedure: Left Heart Cath and Cors/Grafts Angiography;  Surgeon: Jettie Booze, MD;  Location: [place] [facility];  Service: Cardiovascular;  Laterality: N/A;  . CARDIAC CATHETERIZATION N/A 04/28/2016   Procedure: Right/Left Heart Cath and Coronary/Graft Angiography;  Surgeon: Burnell Blanks, MD;  Location: [place] [facility];  Service: Cardiovascular;  Laterality: N/A;  . CARPAL  TUNNEL RELEASE  2007   Excision mass dorsal left wrist  . CORONARY ARTERY BYPASS GRAFT  1993  . FASCIOTOMY Right 07/30/2013   Procedure: OPEN FASCIOTOMY RIGHT RING FINGER & RIGHT SMALL FINGER MULTIPLE LEVELS;  Surgeon: Wynonia Sours, MD;  Location: [place];  Service: Orthopedics;  Laterality: Right;  . HERNIA REPAIR  1988  . LEFT HEART CATHETERIZATION WITH CORONARY ANGIOGRAM N/A 02/28/2014   Procedure: LEFT HEART CATHETERIZATION WITH CORONARY ANGIOGRAM;  Surgeon: Troy Sine, MD;  Location: [facility] CATH LAB;  Service: Cardiovascular;  Laterality: N/A;  . LEFT HEART CATHETERIZATION WITH CORONARY/GRAFT ANGIOGRAM N/A 08/10/2012   Procedure: LEFT HEART CATHETERIZATION WITH Beatrix Fetters;  Surgeon: Hillary Bow, MD;  Location: [facility] CATH LAB;  Service: Cardiovascular;  Laterality: N/A;  . LEFT HEART CATHETERIZATION WITH CORONARY/GRAFT ANGIOGRAM N/A 08/01/2014   Procedure: LEFT HEART CATHETERIZATION WITH Beatrix Fetters;  Surgeon: Leonie Man, MD;  Location: [facility] CATH LAB;  Service: Cardiovascular;  Laterality: N/A;  . LEFT HEART CATHETERIZATION WITH CORONARY/GRAFT ANGIOGRAM N/A 02/12/2015   Procedure: LEFT HEART CATHETERIZATION WITH Beatrix Fetters;  Surgeon: Burnell Blanks, MD;  Location: [facility] CATH LAB;  Service: Cardiovascular;  Laterality: N/A;  . PERCUTANEOUS CORONARY STENT INTERVENTION (PCI-S)  07/31/2013   Procedure: PERCUTANEOUS CORONARY STENT INTERVENTION (PCI-S);  Surgeon: Burnell Blanks, MD;  Location: [facility] CATH LAB;  Service: Cardiovascular;;  LIMA to LAD at the anastomosis with aortic root shot     Current Outpatient Prescriptions  Medication Sig Dispense Refill  . albuterol (PROVENTIL HFA;VENTOLIN HFA) 108 (90 Base) MCG/ACT inhaler Inhale 2 puffs into the lungs every 6 (six) hours as needed for wheezing or shortness of breath. 1 Inhaler 0  . amLODipine (NORVASC) 5 MG tablet TAKE 1 TABLET BY MOUTH ONCE DAILY 30 tablet 3  . aspirin  EC 81 MG tablet Take 81 mg by mouth at bedtime.    Marland Kitchen atorvastatin (LIPITOR) 80 MG tablet TAKE ONE (1) TABLET EACH DAY 30 tablet 9  . bisoprolol (ZEBETA) 5 MG tablet Take 0.5 tablets (2.5 mg total) by mouth 2 (two) times daily. 180 tablet 1  . clopidogrel (PLAVIX) 75 MG tablet TAKE 1 TABLET BY MOUTH ONCE DAILY 30 tablet 3  . famotidine (PEPCID) 20 MG tablet Take 20 mg by mouth 2 (two) times daily.    . fexofenadine (ALLEGRA) 180 MG tablet Take 180 mg by mouth daily as needed for allergies.     . furosemide (LASIX) 40 MG tablet TAKE ONE TABLET BY MOUTH TWICE DAILY 60 tablet 9  . nitroGLYCERIN (NITROSTAT) 0.4 MG SL tablet TAKE 1 TABLET UNDER THE TONGUE AS NEEDEDFOR CHEST PAIN ( MAY REPEAT EVERY 5 MINUTES X 3) 25 tablet 3  . pantoprazole (PROTONIX) 40 MG tablet Take 40 mg by mouth daily as needed (GI upset). fo    . potassium chloride SA (  KLOR-CON M20) 20 MEQ tablet TAKE 1 TABLET (20MEQ) BY MOUTH DAILY. 90 tablet 3  . spironolactone (ALDACTONE) 25 MG tablet TAKE 1/2 TABLET BY MOUTH TWICE A DAY 90 tablet 3  . isosorbide mononitrate (IMDUR) 60 MG 24 hr tablet Take 1 tablet (60 mg total) by mouth 2 (two) times daily. 60 tablet 11   No current facility-administered medications for this visit.     No Known Allergies  Social History   Social History  . Marital status: Married    Spouse name: N/A  . Number of children: 1  . Years of education: N/A   Occupational History  . Plumbing     Retired in 2007   Social History Main Topics  . Smoking status: Current Every Day Smoker    Packs/day: 1.00    Years: 57.00    Types: Cigarettes  . Smokeless tobacco: Never Used  . Alcohol use 3.0 oz/week    5 Cans of beer per week     Comment: daily-6 daily  . Drug use: No  . Sexual activity: Not on file   Other Topics Concern  . Not on file   Social History Narrative   Lives in [place] with his family.  Does not routinely exercise.    Family History  Problem Relation Age of Onset  . Heart  attack Mother     died @ 44.  Marland Kitchen Hypertension Mother   . Hypertension Father   . Hypertension Sister   . Emphysema Brother   . Hypertension Brother   . Allergies Sister   . Stroke Neg Hx     Review of Systems:  As stated in the HPI and otherwise negative.   BP 126/60   Pulse 60   Ht 5\' 5"  (1.651 m)   Wt 148 lb 1.9 oz (67.2 kg)   SpO2 95%   BMI 24.65 kg/m   Physical Examination: General: Well developed, well nourished, NAD  HEENT: OP clear, mucus membranes moist  SKIN: warm, dry. No rashes. Neuro: No focal deficits  Musculoskeletal: Muscle strength 5/5 all ext  Psychiatric: Mood and affect normal  Neck: No JVD, no carotid bruits, no thyromegaly, no lymphadenopathy.  Lungs:Clear bilaterally, no wheezes, rhonci, crackles Cardiovascular: Regular rate and rhythm. Systolic murmur noted. No gallops or rubs. Abdomen:Soft. Bowel sounds present. Non-tender.  Extremities: No lower extremity edema. Pulses are 1 + in the bilateral DP/PT.  Cath July 2017:  1. Severe triple vessel CAD s/p 3V CABG. The only patent graft is the LIMA to the LAD.  2. Chronic occlusion ostial RCA (not injected). The distal RCA/PDA fills from left to right collaterals from the LAD and from the Circumflex.  3. Patent left main stent into the proximal Circumflex and first obtuse marginal branch. There is mild restenosis in this stented segment but it does not appear to be flow limiting.  4. Chronic occlusion proximal LAD. The mid and distal LAD fills from the patent LIMA graft. The stent from the LAD into the LIMA graft is patent without restenosis.  5. Mild LV systolic dysfunction by LV gram. (Echo yesterday with normal LV function. ) 6. NO significant gradient across the aortic valve.   Echo July 2017: Left ventricle: The cavity size was normal. Systolic function was   normal. The estimated ejection fraction was in the range of 55%   to 60%. Wall motion was normal; there were no regional wall   motion  abnormalities. There was an increased relative   contribution of  atrial contraction to ventricular filling.   Doppler parameters are consistent with abnormal left ventricular   relaxation (grade 1 diastolic dysfunction). - Aortic valve: Severely calcified annulus. Trileaflet. Moderate   diffuse thickening and calcification. There was moderate   stenosis. There was mild regurgitation. Valve area (VTI): 1.59   cm^2. Valve area (Vmax): 1.66 cm^2. Valve area (Vmean): 1.57   cm^2. - Mitral valve: Calcified annulus. - Pulmonary arteries: PA peak pressure: 45 mm Hg (S).   EKG:  EKG is not ordered today. The ekg ordered today demonstrates    Recent Labs: 04/27/2016: BUN 22; Creat 1.27; Hemoglobin 14.7; Platelets 230; Potassium 4.8; Sodium 138   Lipid Panel    Component Value Date/Time   CHOL 137 08/18/2015 1056   CHOL 146 03/31/2015 0905   TRIG 128 08/18/2015 1056   HDL 42 08/18/2015 1056   HDL 42 03/31/2015 0905   CHOLHDL 3.3 08/18/2015 1056   VLDL 26 08/18/2015 1056   LDLCALC 69 08/18/2015 1056   LDLCALC 77 03/31/2015 0905     Wt Readings from Last 3 Encounters:  09/24/16 148 lb 1.9 oz (67.2 kg)  05/24/16 150 lb 12.8 oz (68.4 kg)  04/28/16 151 lb (68.5 kg)     Other studies Reviewed: Additional studies/ records that were reviewed today include:  Review of the above records demonstrates:    Assessment and Plan:   1. CAD with unstable angina: He is s/p CABG 1993 and multiple PCI/stenting procedures.  Last cath July 2017 with stable disease. (patent IMA graft to LAD, occlusion of both vein grafts to the PDA and Circumflex, patent left main to Circumflex stent, left to right collaterals filling RCA). He is compliant with his medications but refuses to stop smoking which is contributing to progression of CAD. He understands this. Continue current meds.   2. CAROTID ARTERY DISEASE: The patient had Dopplers done in the fall of 2013 and does not have significant carotid disease    3. Hyperlipidemia: Continue statin. Lipids controlled (see scanned report from June 2017)  4. Aortic valve disease: Moderate AS by echo July 2017. Repeat echo July 2018.   5. HTN: BP controlled. No changes.   6. Tobacco abuse: Smoking cessation encouraged. He does not wish to stop  7. Ischemic cardiomyopathy/Chronic systolic CHF: Continue meds including Lasix and aldactone, beta blocker. Volume status is ok. Last LvEF=55-60% by echo July 2017.   Current medicines are reviewed at length with the patient today.  The patient does not have concerns regarding medicines.  The following changes have been made:    Labs/ tests ordered today include:   Orders Placed This Encounter  Procedures  . ECHOCARDIOGRAM COMPLETE    Disposition:   FU with me 6 months  Signed, Lauree Chandler, MD 09/24/2016 10:11 AM    [facility] [address], [place], [place]  [address] Phone: [phone]; Fax: [phone]

## 2016-09-24 ENCOUNTER — Encounter: Payer: Self-pay | Admitting: Cardiovascular Disease

## 2016-09-24 ENCOUNTER — Ambulatory Visit (INDEPENDENT_AMBULATORY_CARE_PROVIDER_SITE_OTHER): Payer: Medicare Other | Admitting: Cardiovascular Disease

## 2016-09-24 VITALS — BP 126/60 | HR 60 | Ht 65.0 in | Wt 148.1 lb

## 2016-09-24 DIAGNOSIS — I200 Unstable angina: Secondary | ICD-10-CM

## 2016-09-24 DIAGNOSIS — I25119 Atherosclerotic heart disease of native coronary artery with unspecified angina pectoris: Secondary | ICD-10-CM | POA: Diagnosis not present

## 2016-09-24 DIAGNOSIS — E7800 Pure hypercholesterolemia, unspecified: Secondary | ICD-10-CM | POA: Diagnosis not present

## 2016-09-24 DIAGNOSIS — I255 Ischemic cardiomyopathy: Secondary | ICD-10-CM

## 2016-09-24 DIAGNOSIS — I209 Angina pectoris, unspecified: Secondary | ICD-10-CM

## 2016-09-24 DIAGNOSIS — I10 Essential (primary) hypertension: Secondary | ICD-10-CM | POA: Diagnosis not present

## 2016-09-24 DIAGNOSIS — Z720 Tobacco use: Secondary | ICD-10-CM

## 2016-09-24 MED ORDER — ISOSORBIDE MONONITRATE ER 60 MG PO TB24: 60.0000 mg | ORAL_TABLET | Freq: Two times a day (BID) | ORAL | 11 refills | Status: DC

## 2016-09-24 NOTE — Patient Instructions (Signed)
Medication Instructions:  Your physician has recommended you make the following change in your medication:   Change Isosorbide mononitrate to 60 mg by mouth twice daily   Labwork: none  Testing/Procedures: Your physician has requested that you have an echocardiogram. Echocardiography is a painless test that uses sound waves to create images of your heart. It provides your doctor with information about the size and shape of your heart and how well your heart's chambers and valves are working. This procedure takes approximately one hour. There are no restrictions for this procedure.  To be done in July 2018    Follow-Up: Your physician recommends that you schedule a follow-up appointment in:  July--a week or 2 after echo    Any Other Special Instructions Will Be Listed Below (If Applicable).     If you need a refill on your cardiac medications before your next appointment, please call your pharmacy.

## 2016-12-20 ENCOUNTER — Encounter: Payer: Self-pay | Admitting: Cardiovascular Disease

## 2016-12-21 ENCOUNTER — Other Ambulatory Visit: Payer: Self-pay | Admitting: Physician Assistant

## 2017-01-10 DIAGNOSIS — R0689 Other abnormalities of breathing: Secondary | ICD-10-CM | POA: Diagnosis not present

## 2017-01-10 DIAGNOSIS — J449 Chronic obstructive pulmonary disease, unspecified: Secondary | ICD-10-CM | POA: Diagnosis not present

## 2017-01-10 DIAGNOSIS — R0602 Shortness of breath: Secondary | ICD-10-CM | POA: Diagnosis not present

## 2017-01-10 DIAGNOSIS — R000 Tachycardia, unspecified: Secondary | ICD-10-CM | POA: Diagnosis not present

## 2017-01-19 ENCOUNTER — Other Ambulatory Visit: Payer: Self-pay | Admitting: Cardiovascular Disease

## 2017-01-31 DIAGNOSIS — M2540 Effusion, unspecified joint: Secondary | ICD-10-CM | POA: Diagnosis not present

## 2017-02-02 DIAGNOSIS — I25810 Atherosclerosis of coronary artery bypass graft(s) without angina pectoris: Secondary | ICD-10-CM | POA: Diagnosis not present

## 2017-02-02 DIAGNOSIS — J439 Emphysema, unspecified: Secondary | ICD-10-CM | POA: Diagnosis not present

## 2017-02-02 DIAGNOSIS — J449 Chronic obstructive pulmonary disease, unspecified: Secondary | ICD-10-CM | POA: Diagnosis not present

## 2017-02-02 DIAGNOSIS — R0602 Shortness of breath: Secondary | ICD-10-CM | POA: Diagnosis not present

## 2017-02-21 DIAGNOSIS — M109 Gout, unspecified: Secondary | ICD-10-CM | POA: Diagnosis not present

## 2017-04-01 ENCOUNTER — Other Ambulatory Visit: Payer: Self-pay | Admitting: Physician Assistant

## 2017-04-01 DIAGNOSIS — I25119 Atherosclerotic heart disease of native coronary artery with unspecified angina pectoris: Secondary | ICD-10-CM

## 2017-04-01 DIAGNOSIS — E785 Hyperlipidemia, unspecified: Secondary | ICD-10-CM

## 2017-04-18 ENCOUNTER — Ambulatory Visit (HOSPITAL_COMMUNITY): Payer: Medicare Other | Attending: Internal Medicine

## 2017-04-18 ENCOUNTER — Other Ambulatory Visit: Payer: Self-pay

## 2017-04-18 DIAGNOSIS — I352 Nonrheumatic aortic (valve) stenosis with insufficiency: Secondary | ICD-10-CM | POA: Insufficient documentation

## 2017-04-18 DIAGNOSIS — I25119 Atherosclerotic heart disease of native coronary artery with unspecified angina pectoris: Secondary | ICD-10-CM | POA: Diagnosis not present

## 2017-04-18 DIAGNOSIS — I255 Ischemic cardiomyopathy: Secondary | ICD-10-CM | POA: Insufficient documentation

## 2017-04-26 NOTE — Progress Notes (Signed)
Chief Complaint  Patient presents with  . Follow-up    CAD    History of Present Illness: 71 yo male with history of CAD s/p CABG in 1993, HTN, HLD, aortic stenosis and ongoing tobacco abuse here today for follow up. He has severe CAD. He was admitted October 2013 and cath performed with occlusion of LAD, patent IMA to LAD, occluded proximal RCA with occluded SVG to PDA, severe disease in left main into Circumflex with occluded vein graft to Circumflex. Re-do CABG suggested but his aorta was severely calcified. He was seen by Dr. Servando Snare with CT surgery and not felt to be a candidate for re-do bypass. Imdur was added. No intervention performed. He was admitted October 2014 with a NSTEMI. Cardiac cath with 70% left main stenosis, LAD occluded, mid LAD 99 after anastomosis of the graft, prox Circumflex 50% , OM stent with 70% ISR, pRCA occluded, SVG-PDA occluded, SVG-OM occluded, LIMA-LAD patent. A Promus premier DES was placed in the mid LAD extending back into the IMA graft. He was readmitted May 2015 with an inferolateral STEMI and found to have progression of disease in the left main/proximal Circumflex stent. A 3.0 x 28 mm Xience DES was placed from the Circumflex back into the left main. He was readmitted October 2015 with unstable angina. Cardiac cath with stenosis left main stent and OM 1. 3.0 x 23 mm Xience DES placed OM1. The left main stent restenosis was treated with Aldrich balloon. Repeat cardiac cath in April 2016 performed due to chest pain with stable disease. Repeat cardiac cath June 2016 in setting of elevated troponin during admission with bronchitis, stable disease. He continues to smoke. He is followed in Pulmonary but surprisingly as told he had very mild COPD. He has been tried on Ranexa but stopped due to cost. His most recent cardiac cath was in July 2017 and showed stable CAD. Echo July 2018 with LVEF=45-50%, moderate AS, mild AI.     He is here today for follow up. The patient  denies any chest pain, palpitations, lower extremity edema, orthopnea, PND, dizziness, near syncope or syncope. He has no energy. He has ongoing dyspnea.    Primary Care Physician: Ala Bent   Past Medical History:  Diagnosis Date  . Abnormal CT scan, kidney    07/2012 - multiple small cysts  . Aortic stenosis    a. Mod AS/AI by cath 07/2012.;  b.  Echo (07/31/13) is: EF 55-60%, mild aortic stenosis (mean gradient 13);  c. Echo (5/15):  EF 40-45%, mild AS (mean 12 mmHg), mod AI  . Back pain   . Carotid artery occlusion    a. Dopplers 07/2012: no significant high grade obstruction.  . Cholelithiasis    Seen on CT 07/2012  . Coronary artery disease    a. CABG 10/1991. b. cath 07/2012 with prog dsz, turned down for re-do CABG - for med rx for now.; c. NSTEMI/PCI: DES to mLAD ext into IMA graft;  d. Inf STEMI (5/15):  LM 60-70% then 99% before LAD, pLAD occl, pCFX stent 80+% ISR, oRCA 99% then occl (L-R collats to dRCA), L-LAD ok with patent stent, S-CFX occl (old), S-RCA occl (old); PCI: LM ext into CFX with Xience Alpine DES  . Heart failure (HCC) systolic  . Hyperlipidemia   . Hyperlipoproteinemia   . Hypertension   . Ischemic cardiomyopathy    a. 2D ECHO: 08/02/2014; EF 45%; severe hypokinesis base/mid-inferolat segments and severe hypokinesis base inferior segment. Mild LVH,  mild AS (may be underestimated due to LV dysfunction).  Mean gradient (S): 14 mm Hg. Peak gradient (S): 25 mm Hg. VTI ratio of LVOT to aortic valve: 0.37. Mod LA dilation. Mild RV systolic dysfxn     Past Surgical History:  Procedure Laterality Date  . CARDIAC CATHETERIZATION  04/03/99  . CARDIAC CATHETERIZATION N/A 03/20/2015   Procedure: Left Heart Cath and Cors/Grafts Angiography;  Surgeon: Jettie Booze, MD;  Location: [facility];  Service: Cardiovascular;  Laterality: N/A;  . CARDIAC CATHETERIZATION N/A 04/28/2016   Procedure: Right/Left Heart Cath and Coronary/Graft Angiography;  Surgeon:  Burnell Blanks, MD;  Location: [facility];  Service: Cardiovascular;  Laterality: N/A;  . CARPAL TUNNEL RELEASE  2007   Excision mass dorsal left wrist  . CORONARY ARTERY BYPASS GRAFT  1993  . FASCIOTOMY Right 07/30/2013   Procedure: OPEN FASCIOTOMY RIGHT RING FINGER & RIGHT SMALL FINGER MULTIPLE LEVELS;  Surgeon: Wynonia Sours, MD;  Location: [facility];  Service: Orthopedics;  Laterality: Right;  . HERNIA REPAIR  1988  . LEFT HEART CATHETERIZATION WITH CORONARY ANGIOGRAM N/A 02/28/2014   Procedure: LEFT HEART CATHETERIZATION WITH CORONARY ANGIOGRAM;  Surgeon: Troy Sine, MD;  Location: [facility] CATH LAB;  Service: Cardiovascular;  Laterality: N/A;  . LEFT HEART CATHETERIZATION WITH CORONARY/GRAFT ANGIOGRAM N/A 08/10/2012   Procedure: LEFT HEART CATHETERIZATION WITH Beatrix Fetters;  Surgeon: Hillary Bow, MD;  Location: [facility] CATH LAB;  Service: Cardiovascular;  Laterality: N/A;  . LEFT HEART CATHETERIZATION WITH CORONARY/GRAFT ANGIOGRAM N/A 08/01/2014   Procedure: LEFT HEART CATHETERIZATION WITH Beatrix Fetters;  Surgeon: Leonie Man, MD;  Location: [facility] CATH LAB;  Service: Cardiovascular;  Laterality: N/A;  . LEFT HEART CATHETERIZATION WITH CORONARY/GRAFT ANGIOGRAM N/A 02/12/2015   Procedure: LEFT HEART CATHETERIZATION WITH Beatrix Fetters;  Surgeon: Burnell Blanks, MD;  Location: [facility] CATH LAB;  Service: Cardiovascular;  Laterality: N/A;  . PERCUTANEOUS CORONARY STENT INTERVENTION (PCI-S)  07/31/2013   Procedure: PERCUTANEOUS CORONARY STENT INTERVENTION (PCI-S);  Surgeon: Burnell Blanks, MD;  Location: [facility] CATH LAB;  Service: Cardiovascular;;  LIMA to LAD at the anastomosis with aortic root shot     Current Outpatient Prescriptions  Medication Sig Dispense Refill  . albuterol (PROVENTIL HFA;VENTOLIN HFA) 108 (90 Base) MCG/ACT inhaler Inhale 2 puffs into the lungs every 6 (six) hours as needed for wheezing or shortness of  breath. 1 Inhaler 0  . amLODipine (NORVASC) 5 MG tablet TAKE 1 TABLET BY MOUTH ONCE DAILY 30 tablet 3  . aspirin EC 81 MG tablet Take 81 mg by mouth at bedtime.    Marland Kitchen atorvastatin (LIPITOR) 80 MG tablet TAKE 1 TABLET BY MOUTH ONCE DAILY 30 tablet 1  . bisoprolol (ZEBETA) 5 MG tablet Take 0.5 tablets (2.5 mg total) by mouth 2 (two) times daily. 180 tablet 1  . clopidogrel (PLAVIX) 75 MG tablet TAKE 1 TABLET BY MOUTH ONCE DAILY 30 tablet 3  . fexofenadine (ALLEGRA) 180 MG tablet Take 180 mg by mouth daily as needed for allergies.     . nitroGLYCERIN (NITROSTAT) 0.4 MG SL tablet TAKE 1 TABLET UNDER THE TONGUE AS NEEDEDFOR CHEST PAIN ( MAY REPEAT EVERY 5 MINUTES X 3) 25 tablet 3  . pantoprazole (PROTONIX) 40 MG tablet Take 40 mg by mouth daily as needed (GI upset). fo    . pantoprazole (PROTONIX) 40 MG tablet TAKE 1 TABLET BY MOUTH ONCE DAILY 30 tablet 6  . potassium chloride SA (KLOR-CON M20) 20  MEQ tablet TAKE 1 TABLET (20MEQ) BY MOUTH DAILY. 90 tablet 3  . spironolactone (ALDACTONE) 25 MG tablet TAKE 1/2 TABLET BY MOUTH TWICE A DAY 90 tablet 3  . furosemide (LASIX) 40 MG tablet Take 1 tablet (40 mg total) by mouth daily. 90 tablet 3  . isosorbide mononitrate (IMDUR) 60 MG 24 hr tablet Take 1 tablet (60 mg total) by mouth 2 (two) times daily. 60 tablet 11   No current facility-administered medications for this visit.     No Known Allergies  Social History   Social History  . Marital status: Married    Spouse name: N/A  . Number of children: 1  . Years of education: N/A   Occupational History  . Plumbing     Retired in 2007   Social History Main Topics  . Smoking status: Current Every Day Smoker    Packs/day: 1.00    Years: 57.00    Types: Cigarettes  . Smokeless tobacco: Never Used  . Alcohol use 3.0 oz/week    5 Cans of beer per week     Comment: daily-6 daily  . Drug use: No  . Sexual activity: Not on file   Other Topics Concern  . Not on file   Social History Narrative     Lives in [place] with his family.  Does not routinely exercise.    Family History  Problem Relation Age of Onset  . Heart attack Mother        died @ 74.  Marland Kitchen Hypertension Mother   . Hypertension Father   . Hypertension Sister   . Emphysema Brother   . Hypertension Brother   . Allergies Sister   . Stroke Neg Hx     Review of Systems:  As stated in the HPI and otherwise negative.   BP 132/90   Pulse 62   Ht 5\' 5"  (1.651 m)   Wt 149 lb (67.6 kg)   SpO2 98%   BMI 24.79 kg/m   Physical Examination: General: Well developed, well nourished, NAD  HEENT: OP clear, mucus membranes moist  SKIN: warm, dry. No rashes. Neuro: No focal deficits  Musculoskeletal: Muscle strength 5/5 all ext  Psychiatric: Mood and affect normal  Neck: No JVD, no carotid bruits, no thyromegaly, no lymphadenopathy.  Lungs:Clear bilaterally, no wheezes, rhonci, crackles Cardiovascular: Regular rate and rhythm. No murmurs, gallops or rubs. Abdomen:Soft. Bowel sounds present. Non-tender.  Extremities: No lower extremity edema. Pulses are 2 + in the bilateral DP/PT.   Cardiac Cath July 2017:  1. Severe triple vessel CAD s/p 3V CABG. The only patent graft is the LIMA to the LAD.  2. Chronic occlusion ostial RCA (not injected). The distal RCA/PDA fills from left to right collaterals from the LAD and from the Circumflex.  3. Patent left main stent into the proximal Circumflex and first obtuse marginal branch. There is mild restenosis in this stented segment but it does not appear to be flow limiting.  4. Chronic occlusion proximal LAD. The mid and distal LAD fills from the patent LIMA graft. The stent from the LAD into the LIMA graft is patent without restenosis.  5. Mild LV systolic dysfunction by LV gram. (Echo yesterday with normal LV function. ) 6. NO significant gradient across the aortic valve.   Echo July 2018: Left ventricle: The cavity size was normal. Wall thickness was   increased in a pattern  of mild LVH. Systolic function was mildly   reduced. The estimated ejection fraction was  in the range of 45%   to 50%. Images were inadequate for LV wall motion assessment.   Doppler parameters are consistent with abnormal left ventricular   relaxation (grade 1 diastolic dysfunction). The E/e&' ratio is <8,   suggesting normal LV filling pressure. - Aortic valve: Mildly calcified. Moderate stenosis. There was mild   regurgitation. Mean gradient (S): 19 mm Hg. Peak gradient (S): 39   mm Hg. Valve area (Vmean): 1.45 cm^2. - Left atrium: Moderately dilated. - Right ventricle: The cavity size was mildly dilated. - Inferior vena cava: The vessel was normal in size. The   respirophasic diameter changes were in the normal range (>= 50%),   consistent with normal central venous pressure.   EKG:  EKG is not  ordered today. The ekg ordered today demonstrates Sinus brady, rate 56 bpm. IVCD. ST and T wave abn, unchanged.   Recent Labs: 04/27/2016: BUN 22; Creat 1.27; Hemoglobin 14.7; Platelets 230; Potassium 4.8; Sodium 138   Lipid Panel    Component Value Date/Time   CHOL 137 08/18/2015 1056   CHOL 146 03/31/2015 0905   TRIG 128 08/18/2015 1056   HDL 42 08/18/2015 1056   HDL 42 03/31/2015 0905   CHOLHDL 3.3 08/18/2015 1056   VLDL 26 08/18/2015 1056   LDLCALC 69 08/18/2015 1056   LDLCALC 77 03/31/2015 0905     Wt Readings from Last 3 Encounters:  04/27/17 149 lb (67.6 kg)  09/24/16 148 lb 1.9 oz (67.2 kg)  05/24/16 150 lb 12.8 oz (68.4 kg)     Other studies Reviewed: Additional studies/ records that were reviewed today include:  Review of the above records demonstrates:    Assessment and Plan:   1. CAD s/p CABG with stable angina: He had CABG in 1993 and has had multiple stenting procedures since then. Last cath in July 2017 with stable CAD. He refuses to stop smoking. Will continue ASA, Plavix, statin, beta blocker, Norvasc.    2. Hyperlipidemia: Continue statin. Lipids  followed in primary care. He will have labs sent over here today.   3. Aortic valve disease: Moderate AS by echo July 2018. Repeat echo July 2019.   4. HTN: BP controlled. No changes.   5. Tobacco abuse: I have once again advised him to stop smoking. He has no desire to stop smoking.   6. Ischemic cardiomyopathy/Chronic diastolic CHF: WUJW=11% by echo July 2018. Volume status is ok. Will continue Lasix, aldactone and a beta blocker. Will reduce Lasix to 40 mg daily as he feels that he is getting dehydrated.   Current medicines are reviewed at length with the patient today.  The patient does not have concerns regarding medicines.  The following changes have been made:    Labs/ tests ordered today include:   Orders Placed This Encounter  Procedures  . EKG 12-Lead    Disposition:   FU with me 6 months  Signed, Lauree Chandler, MD 04/27/2017 11:49 AM    [facility] [address], [place], [place]  [address] Phone: [phone]; Fax: [phone]

## 2017-04-27 ENCOUNTER — Ambulatory Visit (INDEPENDENT_AMBULATORY_CARE_PROVIDER_SITE_OTHER): Payer: Medicare Other | Admitting: Cardiovascular Disease

## 2017-04-27 ENCOUNTER — Encounter: Payer: Self-pay | Admitting: Cardiovascular Disease

## 2017-04-27 VITALS — BP 132/90 | HR 62 | Ht 65.0 in | Wt 149.0 lb

## 2017-04-27 DIAGNOSIS — I5032 Chronic diastolic (congestive) heart failure: Secondary | ICD-10-CM | POA: Diagnosis not present

## 2017-04-27 DIAGNOSIS — I209 Angina pectoris, unspecified: Secondary | ICD-10-CM

## 2017-04-27 DIAGNOSIS — I25118 Atherosclerotic heart disease of native coronary artery with other forms of angina pectoris: Secondary | ICD-10-CM | POA: Diagnosis not present

## 2017-04-27 DIAGNOSIS — I350 Nonrheumatic aortic (valve) stenosis: Secondary | ICD-10-CM

## 2017-04-27 DIAGNOSIS — I255 Ischemic cardiomyopathy: Secondary | ICD-10-CM

## 2017-04-27 DIAGNOSIS — I10 Essential (primary) hypertension: Secondary | ICD-10-CM | POA: Diagnosis not present

## 2017-04-27 DIAGNOSIS — E7800 Pure hypercholesterolemia, unspecified: Secondary | ICD-10-CM

## 2017-04-27 MED ORDER — FUROSEMIDE 40 MG PO TABS: 40.0000 mg | ORAL_TABLET | Freq: Every day | ORAL | 3 refills | Status: DC

## 2017-04-27 NOTE — Patient Instructions (Signed)
Medication Instructions:  Your physician has recommended you make the following change in your medication: Decrease lasix to 40 mg by mouth daily.    Labwork: none  Testing/Procedures: none  Follow-Up: Your physician recommends that you schedule a follow-up appointment in: 6 months. Please call our office in about 3 months to schedule this appointment.     Any Other Special Instructions Will Be Listed Below (If Applicable).     If you need a refill on your cardiac medications before your next appointment, please call your pharmacy.

## 2017-05-09 ENCOUNTER — Other Ambulatory Visit: Payer: Self-pay

## 2017-05-09 MED ORDER — FUROSEMIDE 40 MG PO TABS: 40.0000 mg | ORAL_TABLET | Freq: Every day | ORAL | 1 refills | Status: DC

## 2017-05-10 DIAGNOSIS — H00019 Hordeolum externum unspecified eye, unspecified eyelid: Secondary | ICD-10-CM | POA: Diagnosis not present

## 2017-05-11 ENCOUNTER — Other Ambulatory Visit: Payer: Self-pay | Admitting: Physician Assistant

## 2017-05-11 DIAGNOSIS — R0602 Shortness of breath: Secondary | ICD-10-CM

## 2017-05-16 ENCOUNTER — Other Ambulatory Visit: Payer: Self-pay | Admitting: Cardiovascular Disease

## 2017-06-01 DIAGNOSIS — I10 Essential (primary) hypertension: Secondary | ICD-10-CM | POA: Diagnosis not present

## 2017-06-01 DIAGNOSIS — R0602 Shortness of breath: Secondary | ICD-10-CM | POA: Diagnosis not present

## 2017-06-01 DIAGNOSIS — H748X2 Other specified disorders of left middle ear and mastoid: Secondary | ICD-10-CM | POA: Diagnosis not present

## 2017-06-01 DIAGNOSIS — H73019 Bullous myringitis, unspecified ear: Secondary | ICD-10-CM | POA: Diagnosis not present

## 2017-06-01 DIAGNOSIS — H7392 Unspecified disorder of tympanic membrane, left ear: Secondary | ICD-10-CM | POA: Diagnosis not present

## 2017-06-01 DIAGNOSIS — H6690 Otitis media, unspecified, unspecified ear: Secondary | ICD-10-CM | POA: Diagnosis not present

## 2017-06-02 ENCOUNTER — Other Ambulatory Visit: Payer: Self-pay | Admitting: Cardiovascular Disease

## 2017-06-02 DIAGNOSIS — E785 Hyperlipidemia, unspecified: Secondary | ICD-10-CM

## 2017-06-02 DIAGNOSIS — I25119 Atherosclerotic heart disease of native coronary artery with unspecified angina pectoris: Secondary | ICD-10-CM

## 2017-06-15 DIAGNOSIS — G501 Atypical facial pain: Secondary | ICD-10-CM | POA: Diagnosis not present

## 2017-06-15 DIAGNOSIS — H9202 Otalgia, left ear: Secondary | ICD-10-CM | POA: Diagnosis not present

## 2017-06-15 DIAGNOSIS — I10 Essential (primary) hypertension: Secondary | ICD-10-CM | POA: Diagnosis not present

## 2017-06-17 ENCOUNTER — Telehealth: Payer: Self-pay | Admitting: Cardiovascular Disease

## 2017-06-17 ENCOUNTER — Other Ambulatory Visit: Payer: Self-pay | Admitting: Cardiovascular Disease

## 2017-06-17 DIAGNOSIS — I255 Ischemic cardiomyopathy: Secondary | ICD-10-CM

## 2017-06-17 DIAGNOSIS — R0602 Shortness of breath: Secondary | ICD-10-CM

## 2017-06-17 MED ORDER — FUROSEMIDE 40 MG PO TABS: 40.0000 mg | ORAL_TABLET | Freq: Two times a day (BID) | ORAL | 2 refills | Status: DC

## 2017-06-17 NOTE — Telephone Encounter (Signed)
I spoke with pt. He reports at last office visit with Dr. Angelena Form Lasix was decreased at his request.  Note indicates pt felt he was getting dehydrated.  Pt reports he saw primary care about 2 weeks ago and lasix was increased back to 40 mg twice daily because doctor heard fluid in his lungs. Primary care was going to send [place] visit information.   A new prescription for lasix was not sent to pharmacy.  Previous prescription was from Dr. Angelena Form and pt will run out sooner now that he is taking twice daily.  He is asking for prescription to be sent to [facility].  Will send in.  He has not had lab work since medication change and I scheduled him for BMP on 9/4.  I called primary care office (Dr. Nona Dell) and asked them to fax [place] office note

## 2017-06-17 NOTE — Telephone Encounter (Signed)
Steve Durham is calling to discuss his prescription ( Lasix)  Please call . Thanks

## 2017-06-17 NOTE — Telephone Encounter (Signed)
Left message to call back  

## 2017-06-20 NOTE — Telephone Encounter (Signed)
Agree. Thanks. Steve Durham 

## 2017-06-21 ENCOUNTER — Other Ambulatory Visit: Payer: Medicare Other

## 2017-06-22 NOTE — Telephone Encounter (Signed)
Pt did not have lab work done yesterday.  I spoke with him and he reports he forgot.  He will come in this week to have BMP done or have done at primary care and send results to [place].

## 2017-06-23 ENCOUNTER — Other Ambulatory Visit: Payer: Medicare Other | Admitting: *Deleted

## 2017-06-23 ENCOUNTER — Other Ambulatory Visit: Payer: Self-pay | Admitting: Cardiovascular Disease

## 2017-06-23 DIAGNOSIS — I10 Essential (primary) hypertension: Secondary | ICD-10-CM

## 2017-06-23 DIAGNOSIS — I255 Ischemic cardiomyopathy: Secondary | ICD-10-CM | POA: Diagnosis not present

## 2017-06-23 LAB — BASIC METABOLIC PANEL
BUN/Creatinine Ratio: 17 (ref 10–24)
BUN: 22 mg/dL (ref 8–27)
CO2: 26 mmol/L (ref 20–29)
CREATININE: 1.28 mg/dL — AB (ref 0.76–1.27)
Calcium: 9.2 mg/dL (ref 8.6–10.2)
Chloride: 99 mmol/L (ref 96–106)
GFR calc Af Amer: 64 mL/min/{1.73_m2} (ref >59)
GFR calc non Af Amer: 55 mL/min/{1.73_m2} — ABNORMAL LOW (ref >59)
GLUCOSE: 82 mg/dL (ref 65–99)
POTASSIUM: 4.2 mmol/L (ref 3.5–5.2)
SODIUM: 139 mmol/L (ref 134–144)

## 2017-06-29 DIAGNOSIS — H9202 Otalgia, left ear: Secondary | ICD-10-CM | POA: Diagnosis not present

## 2017-07-19 DIAGNOSIS — Z23 Encounter for immunization: Secondary | ICD-10-CM | POA: Diagnosis not present

## 2017-07-25 ENCOUNTER — Other Ambulatory Visit: Payer: Self-pay | Admitting: Cardiovascular Disease

## 2017-08-03 DIAGNOSIS — L578 Other skin changes due to chronic exposure to nonionizing radiation: Secondary | ICD-10-CM | POA: Diagnosis not present

## 2017-08-03 DIAGNOSIS — R233 Spontaneous ecchymoses: Secondary | ICD-10-CM | POA: Diagnosis not present

## 2017-08-03 DIAGNOSIS — L570 Actinic keratosis: Secondary | ICD-10-CM | POA: Diagnosis not present

## 2017-08-03 DIAGNOSIS — L821 Other seborrheic keratosis: Secondary | ICD-10-CM | POA: Diagnosis not present

## 2017-08-08 ENCOUNTER — Other Ambulatory Visit: Payer: Self-pay | Admitting: Cardiovascular Disease

## 2017-08-25 ENCOUNTER — Other Ambulatory Visit: Payer: Self-pay | Admitting: Cardiovascular Disease

## 2017-09-05 DIAGNOSIS — R0600 Dyspnea, unspecified: Secondary | ICD-10-CM | POA: Diagnosis not present

## 2017-09-13 ENCOUNTER — Other Ambulatory Visit: Payer: Self-pay | Admitting: Cardiovascular Disease

## 2017-09-23 ENCOUNTER — Other Ambulatory Visit: Payer: Self-pay | Admitting: Cardiovascular Disease

## 2017-10-12 DIAGNOSIS — M549 Dorsalgia, unspecified: Secondary | ICD-10-CM | POA: Insufficient documentation

## 2017-10-12 DIAGNOSIS — I509 Heart failure, unspecified: Secondary | ICD-10-CM | POA: Insufficient documentation

## 2017-10-12 DIAGNOSIS — E785 Hyperlipidemia, unspecified: Secondary | ICD-10-CM | POA: Insufficient documentation

## 2017-10-12 DIAGNOSIS — I10 Essential (primary) hypertension: Secondary | ICD-10-CM | POA: Insufficient documentation

## 2017-10-12 DIAGNOSIS — K8020 Calculus of gallbladder without cholecystitis without obstruction: Secondary | ICD-10-CM | POA: Insufficient documentation

## 2017-10-12 DIAGNOSIS — R93429 Abnormal radiologic findings on diagnostic imaging of unspecified kidney: Secondary | ICD-10-CM | POA: Insufficient documentation

## 2017-10-12 DIAGNOSIS — I6529 Occlusion and stenosis of unspecified carotid artery: Secondary | ICD-10-CM | POA: Insufficient documentation

## 2017-10-17 DIAGNOSIS — M109 Gout, unspecified: Secondary | ICD-10-CM | POA: Diagnosis not present

## 2017-11-02 ENCOUNTER — Encounter: Payer: Self-pay | Admitting: Cardiovascular Disease

## 2017-11-02 ENCOUNTER — Ambulatory Visit (INDEPENDENT_AMBULATORY_CARE_PROVIDER_SITE_OTHER): Payer: Medicare Other | Admitting: Cardiovascular Disease

## 2017-11-02 VITALS — BP 130/70 | HR 61 | Ht 65.0 in | Wt 147.4 lb

## 2017-11-02 DIAGNOSIS — I350 Nonrheumatic aortic (valve) stenosis: Secondary | ICD-10-CM

## 2017-11-02 DIAGNOSIS — I25118 Atherosclerotic heart disease of native coronary artery with other forms of angina pectoris: Secondary | ICD-10-CM | POA: Diagnosis not present

## 2017-11-02 DIAGNOSIS — E7800 Pure hypercholesterolemia, unspecified: Secondary | ICD-10-CM

## 2017-11-02 DIAGNOSIS — I209 Angina pectoris, unspecified: Secondary | ICD-10-CM

## 2017-11-02 DIAGNOSIS — Z720 Tobacco use: Secondary | ICD-10-CM

## 2017-11-02 DIAGNOSIS — I10 Essential (primary) hypertension: Secondary | ICD-10-CM | POA: Diagnosis not present

## 2017-11-02 DIAGNOSIS — I5032 Chronic diastolic (congestive) heart failure: Secondary | ICD-10-CM | POA: Diagnosis not present

## 2017-11-02 MED ORDER — NITROGLYCERIN 0.4 MG SL SUBL: SUBLINGUAL_TABLET | SUBLINGUAL | 6 refills | Status: DC

## 2017-11-02 MED ORDER — NICOTINE 21 MG/24HR TD PT24: 21.0000 mg | MEDICATED_PATCH | Freq: Every day | TRANSDERMAL | 6 refills | Status: DC

## 2017-11-02 NOTE — Progress Notes (Signed)
Chief Complaint  Patient presents with  . Coronary Artery Disease   History of Present Illness: 72 yo male with history of CAD s/p CABG in 1993, HTN, HLD, aortic stenosis and ongoing tobacco abuse here today for follow up. He has severe CAD. He was admitted October 2013 and cath performed with occlusion of LAD, patent IMA to LAD, occluded proximal RCA with occluded SVG to PDA, severe disease in left main into Circumflex with occluded vein graft to Circumflex. Re-do CABG suggested but his aorta was severely calcified. He was seen by Dr. Servando Snare with CT surgery and not felt to be a candidate for re-do bypass. Imdur was added. No intervention performed. He was admitted October 2014 with a NSTEMI. Cardiac cath with 70% left main stenosis, LAD occluded, mid LAD 99 after anastomosis of the graft, prox Circumflex 50% , OM stent with 70% ISR, pRCA occluded, SVG-PDA occluded, SVG-OM occluded, LIMA-LAD patent. A drug eluting stent was placed in the mid LAD extending back into the IMA graft. He was readmitted May 2015 with an inferolateral STEMI and found to have progression of disease in the left main/proximal Circumflex stent. A drug eluting stent was placed from the Circumflex back into the left main. He was readmitted October 2015 with unstable angina. Cardiac cath with stenosis left main stent and OM 1. A drug eluting stent was placed in the first OM branch. The left main stent restenosis was treated with balloon angioplasty. Repeat caths in April 2016 and June 2016 with stable disease. Despite all of his coronary interventions, he continues to smoke. He has been tried on Ranexa but stopped due to cost. His most recent cardiac cath was in July 2017 and showed stable CAD. Echo July 2018 with LVEF=45-50%, moderate AS, mild AI.     He is here today for follow up. He has daily chest pains, unchanged in severity, responsive to NTG. The patient denies any palpitations, lower extremity edema, orthopnea, PND,  dizziness, near syncope or syncope. He has baseline dyspnea. He is still smoking.   Primary Care Physician: Ala Bent   Past Medical History:  Diagnosis Date  . Abnormal CT scan, kidney    07/2012 - multiple small cysts  . Aortic stenosis    a. Mod AS/AI by cath 07/2012.;  b.  Echo (07/31/13) is: EF 55-60%, mild aortic stenosis (mean gradient 13);  c. Echo (5/15):  EF 40-45%, mild AS (mean 12 mmHg), mod AI  . Back pain   . Carotid artery occlusion    a. Dopplers 07/2012: no significant high grade obstruction.  . Cholelithiasis    Seen on CT 07/2012  . Coronary artery disease    a. CABG 10/1991. b. cath 07/2012 with prog dsz, turned down for re-do CABG - for med rx for now.; c. NSTEMI/PCI: DES to mLAD ext into IMA graft;  d. Inf STEMI (5/15):  LM 60-70% then 99% before LAD, pLAD occl, pCFX stent 80+% ISR, oRCA 99% then occl (L-R collats to dRCA), L-LAD ok with patent stent, S-CFX occl (old), S-RCA occl (old); PCI: LM ext into CFX with Xience Alpine DES  . Heart failure (HCC) systolic  . Hyperlipidemia   . Hyperlipoproteinemia   . Hypertension   . Ischemic cardiomyopathy    a. 2D ECHO: 08/02/2014; EF 45%; severe hypokinesis base/mid-inferolat segments and severe hypokinesis base inferior segment. Mild LVH, mild AS (may be underestimated due to LV dysfunction).  Mean gradient (S): 14 mm Hg. Peak gradient (S): 25 mm Hg.  VTI ratio of LVOT to aortic valve: 0.37. Mod LA dilation. Mild RV systolic dysfxn     Past Surgical History:  Procedure Laterality Date  . CARDIAC CATHETERIZATION  04/03/99  . CARDIAC CATHETERIZATION N/A 03/20/2015   Procedure: Left Heart Cath and Cors/Grafts Angiography;  Surgeon: Jettie Booze, MD;  Location: [facility];  Service: Cardiovascular;  Laterality: N/A;  . CARDIAC CATHETERIZATION N/A 04/28/2016   Procedure: Right/Left Heart Cath and Coronary/Graft Angiography;  Surgeon: Burnell Blanks, MD;  Location: [facility];  Service:  Cardiovascular;  Laterality: N/A;  . CARPAL TUNNEL RELEASE  2007   Excision mass dorsal left wrist  . CORONARY ARTERY BYPASS GRAFT  1993  . FASCIOTOMY Right 07/30/2013   Procedure: OPEN FASCIOTOMY RIGHT RING FINGER & RIGHT SMALL FINGER MULTIPLE LEVELS;  Surgeon: Wynonia Sours, MD;  Location: [facility];  Service: Orthopedics;  Laterality: Right;  . HERNIA REPAIR  1988  . LEFT HEART CATHETERIZATION WITH CORONARY ANGIOGRAM N/A 02/28/2014   Procedure: LEFT HEART CATHETERIZATION WITH CORONARY ANGIOGRAM;  Surgeon: Troy Sine, MD;  Location: [facility] LAB;  Service: Cardiovascular;  Laterality: N/A;  . LEFT HEART CATHETERIZATION WITH CORONARY/GRAFT ANGIOGRAM N/A 08/10/2012   Procedure: LEFT HEART CATHETERIZATION WITH Beatrix Fetters;  Surgeon: Hillary Bow, MD;  Location: [facility] CATH LAB;  Service: Cardiovascular;  Laterality: N/A;  . LEFT HEART CATHETERIZATION WITH CORONARY/GRAFT ANGIOGRAM N/A 08/01/2014   Procedure: LEFT HEART CATHETERIZATION WITH Beatrix Fetters;  Surgeon: Leonie Man, MD;  Location: [facility] CATH LAB;  Service: Cardiovascular;  Laterality: N/A;  . LEFT HEART CATHETERIZATION WITH CORONARY/GRAFT ANGIOGRAM N/A 02/12/2015   Procedure: LEFT HEART CATHETERIZATION WITH Beatrix Fetters;  Surgeon: Burnell Blanks, MD;  Location: [facility] CATH LAB;  Service: Cardiovascular;  Laterality: N/A;  . PERCUTANEOUS CORONARY STENT INTERVENTION (PCI-S)  07/31/2013   Procedure: PERCUTANEOUS CORONARY STENT INTERVENTION (PCI-S);  Surgeon: Burnell Blanks, MD;  Location: [facility] CATH LAB;  Service: Cardiovascular;;  LIMA to LAD at the anastomosis with aortic root shot     Current Outpatient Medications  Medication Sig Dispense Refill  . allopurinol (ZYLOPRIM) 100 MG tablet Take 100 mg by mouth daily.  4  . amLODipine (NORVASC) 5 MG tablet Take 1 tablet (5 mg total) by mouth daily. 90 tablet 3  . aspirin EC 81 MG tablet Take 81 mg by mouth at bedtime.    Marland Kitchen  atorvastatin (LIPITOR) 80 MG tablet TAKE ONE (1) TABLET EACH DAY 90 tablet 3  . bisoprolol (ZEBETA) 5 MG tablet Take 0.5 tablets (2.5 mg total) by mouth 2 (two) times daily. 90 tablet 3  . clopidogrel (PLAVIX) 75 MG tablet Take 1 tablet (75 mg total) by mouth daily. 90 tablet 3  . fexofenadine (ALLEGRA) 180 MG tablet Take 180 mg by mouth daily as needed for allergies.     . furosemide (LASIX) 40 MG tablet Take 1 tablet (40 mg total) by mouth 2 (two) times daily. 180 tablet 2  . isosorbide mononitrate (IMDUR) 60 MG 24 hr tablet Take 1 tablet (60 mg total) by mouth 2 (two) times daily. 180 tablet 2  . nitroGLYCERIN (NITROSTAT) 0.4 MG SL tablet TAKE 1 TABLET UNDER THE TONGUE AS NEEDEDFOR CHEST PAIN ( MAY REPEAT EVERY 5 MINUTES X 3) 25 tablet 6  . pantoprazole (PROTONIX) 40 MG tablet TAKE ONE TABLET BY MOUTH ONCE DAILY 30 tablet 11  . potassium chloride SA (K-DUR,KLOR-CON) 20 MEQ tablet TAKE 1 TABLET BY MOUTH ONCE DAILY 90  tablet 2  . spironolactone (ALDACTONE) 25 MG tablet TAKE 1/2 TABLET BY MOUTH TWICE DAILY 90 tablet 1  . nicotine (NICODERM CQ - DOSED IN MG/24 HOURS) 21 mg/24hr patch Place 1 patch (21 mg total) onto the skin daily. 28 patch 6   No current facility-administered medications for this visit.     No Known Allergies  Social History   Socioeconomic History  . Marital status: Married    Spouse name: Not on file  . Number of children: 1  . Years of education: Not on file  . Highest education level: Not on file  Social Needs  . Financial resource strain: Not on file  . Food insecurity - worry: Not on file  . Food insecurity - inability: Not on file  . Transportation needs - medical: Not on file  . Transportation needs - non-medical: Not on file  Occupational History  . Occupation: Plumbing    Comment: Retired in 2007  Tobacco Use  . Smoking status: Current Every Day Smoker    Packs/day: 1.00    Years: 57.00    Pack years: 57.00    Types: Cigarettes  . Smokeless tobacco:  Never Used  Substance and Sexual Activity  . Alcohol use: Yes    Alcohol/week: 3.0 oz    Types: 5 Cans of beer per week    Comment: daily-6 daily  . Drug use: No  . Sexual activity: Not on file  Other Topics Concern  . Not on file  Social History Narrative   Lives in [place] with his family.  Does not routinely exercise.    Family History  Problem Relation Age of Onset  . Heart attack Mother        died @ 58.  Marland Kitchen Hypertension Mother   . Hypertension Father   . Hypertension Sister   . Emphysema Brother   . Hypertension Brother   . Allergies Sister   . Stroke Neg Hx     Review of Systems:  As stated in the HPI and otherwise negative.   BP 130/70   Pulse 61   Ht 5\' 5"  (1.651 m)   Wt 147 lb 6.4 oz (66.9 kg)   SpO2 96%   BMI 24.53 kg/m   Physical Examination:  General: Well developed, well nourished, NAD  HEENT: OP clear, mucus membranes moist  SKIN: warm, dry. No rashes. Neuro: No focal deficits  Musculoskeletal: Muscle strength 5/5 all ext  Psychiatric: Mood and affect normal  Neck: No JVD, no carotid bruits, no thyromegaly, no lymphadenopathy.  Lungs:Clear bilaterally, no wheezes, rhonci, crackles Cardiovascular: Regular rate and rhythm. No murmurs, gallops or rubs. Abdomen:Soft. Bowel sounds present. Non-tender.  Extremities: No lower extremity edema. Pulses are 2 + in the bilateral DP/PT.  Cardiac Cath July 2017:  1. Severe triple vessel CAD s/p 3V CABG. The only patent graft is the LIMA to the LAD.  2. Chronic occlusion ostial RCA (not injected). The distal RCA/PDA fills from left to right collaterals from the LAD and from the Circumflex.  3. Patent left main stent into the proximal Circumflex and first obtuse marginal branch. There is mild restenosis in this stented segment but it does not appear to be flow limiting.  4. Chronic occlusion proximal LAD. The mid and distal LAD fills from the patent LIMA graft. The stent from the LAD into the LIMA graft is patent  without restenosis.  5. Mild LV systolic dysfunction by LV gram. (Echo yesterday with normal LV function. ) 6. NO  significant gradient across the aortic valve.   Echo July 2018: Left ventricle: The cavity size was normal. Wall thickness was   increased in a pattern of mild LVH. Systolic function was mildly   reduced. The estimated ejection fraction was in the range of 45%   to 50%. Images were inadequate for LV wall motion assessment.   Doppler parameters are consistent with abnormal left ventricular   relaxation (grade 1 diastolic dysfunction). The E/e&' ratio is <8,   suggesting normal LV filling pressure. - Aortic valve: Mildly calcified. Moderate stenosis. There was mild   regurgitation. Mean gradient (S): 19 mm Hg. Peak gradient (S): 39   mm Hg. Valve area (Vmean): 1.45 cm^2. - Left atrium: Moderately dilated. - Right ventricle: The cavity size was mildly dilated. - Inferior vena cava: The vessel was normal in size. The   respirophasic diameter changes were in the normal range (>= 50%),   consistent with normal central venous pressure.  EKG: The EKG is ordered today.  This demonstrates   Recent Labs: 06/23/2017: BUN 22; Creatinine, Ser 1.28; Potassium 4.2; Sodium 139   Lipid Panel    Component Value Date/Time   CHOL 137 08/18/2015 1056   CHOL 146 03/31/2015 0905   TRIG 128 08/18/2015 1056   HDL 42 08/18/2015 1056   HDL 42 03/31/2015 0905   CHOLHDL 3.3 08/18/2015 1056   VLDL 26 08/18/2015 1056   LDLCALC 69 08/18/2015 1056   LDLCALC 77 03/31/2015 0905     Wt Readings from Last 3 Encounters:  11/02/17 147 lb 6.4 oz (66.9 kg)  04/27/17 149 lb (67.6 kg)  09/24/16 148 lb 1.9 oz (67.2 kg)     Other studies Reviewed: Additional studies/ records that were reviewed today include:  Review of the above records demonstrates:    Assessment and Plan:   1. CAD s/p CABG with stable angina: He had CABG in 1993 and has had multiple stenting procedures since then. Last cath in  July 2017 with stable CAD. He is doing well overall. His angina is stable. He is using NTG several times per week and his chest pain is responsive to this. Will refill NTG today. He does not wish to stop smoking. Will continue ASA, Plavix, statin, beta blocker, Imdur and Norvasc.     2. Hyperlipidemia: Lipids followed in primary care and well controlled per pt. Continue statin. I have asked him to have lipids and LFTs included in bloodwork this spring in primary care and to send this to [place].   3. Aortic valve disease: Moderate AS by echo July 2018. He will need repeat echo in July 2019. We will schedule this echo today.    4. HTN: BP is controlled. No changes today.   5. Tobacco abuse: Smoking cessation is advised. He wishes to try nicotine patches.   6. Ischemic cardiomyopathy/Chronic diastolic CHF: RCVE=93% by echo July 2018. Volume status is ok today. Continue current medical therapy.    Current medicines are reviewed at length with the patient today.  The patient does not have concerns regarding medicines.  The following changes have been made:    Labs/ tests ordered today include:   Orders Placed This Encounter  Procedures  . ECHOCARDIOGRAM COMPLETE    Disposition:   FU with me 6 months  Signed, Lauree Chandler, MD 11/02/2017 10:18 AM    [facility] [place], [place], [place]  [address] Phone: [phone]; Fax: [phone]

## 2017-11-02 NOTE — Patient Instructions (Addendum)
Medication Instructions:  Your physician recommends that you continue on your current medications as directed. Please refer to the Current Medication list given to you today. A prescription for nicotine patch has been sent to your pharmacy  Labwork: none  Testing/Procedures: Your physician has requested that you have an echocardiogram. Echocardiography is a painless test that uses sound waves to create images of your heart. It provides your doctor with information about the size and shape of your heart and how well your heart's chambers and valves are working. This procedure takes approximately one hour. There are no restrictions for this procedure.  To be done in July  Follow-Up: Your physician recommends that you schedule a follow-up appointment in: 6 months. Please call our office in about 3 months to schedule this appointment    Any Other Special Instructions Will Be Listed Below (If Applicable).     If you need a refill on your cardiac medications before your next appointment, please call your pharmacy.

## 2017-11-04 DIAGNOSIS — I10 Essential (primary) hypertension: Secondary | ICD-10-CM | POA: Diagnosis not present

## 2017-11-04 DIAGNOSIS — E785 Hyperlipidemia, unspecified: Secondary | ICD-10-CM | POA: Diagnosis not present

## 2017-11-04 DIAGNOSIS — I252 Old myocardial infarction: Secondary | ICD-10-CM | POA: Diagnosis not present

## 2018-02-16 ENCOUNTER — Other Ambulatory Visit: Payer: Self-pay | Admitting: Cardiovascular Disease

## 2018-03-17 DIAGNOSIS — R0602 Shortness of breath: Secondary | ICD-10-CM | POA: Diagnosis not present

## 2018-03-17 DIAGNOSIS — R062 Wheezing: Secondary | ICD-10-CM | POA: Diagnosis not present

## 2018-03-21 ENCOUNTER — Telehealth: Payer: Self-pay | Admitting: Cardiovascular Disease

## 2018-03-21 NOTE — Telephone Encounter (Addendum)
I spoke with pt's daughter. She reports pt has been short of breath for last week. Was seen in primary care recently and treated with inhaler.  She thinks this may be cause of nausea. Shortness of breath is not getting better. Daughter reports pt is more tired recently. When I asked about forgetfulness daughter reports this is new.  Yesterday pt was in his car and couldn't remember where he was. Sat in the parking lot for 30 minutes and this eventually improved. Has not had another episode that daughter is aware of.  I advised daughter due to new symptoms yesterday he should be evaluated in ED as this could be a stroke like symptom.  Daughter reports pt refuses to go to ED.  She states "pt will keel over dead before he goes to the ED." Daughter is upset because she wants pt evaluated by cardiology and we won't schedule an appointment.  I tried again to explain to pt's daughter that symptoms pt had yesterday may be neurologic related and needed to be evaluated urgently. Pt's daughter reports she works at [employer] primary care office and is going to have doctor there contact our office to speak with provider in office today.  Daughter would also like to speak with management.

## 2018-03-21 NOTE — Telephone Encounter (Signed)
Returned call to daughter to discuss her concerns in regards to her father.  She and the NP at her office are aware of our recommendations to be evaluated at the ER due to the episode he experienced yesterday.  His daughter states she has spoken to him about this and he is refusing to go.  She says he "is like a [place]".  I reiterated the importance of having him evaluated but she says she is not able to make him go.  I let her know that Dr Angelena Form could see him on 03/27/18 if she would like to discuss his weakness and SOB.  She is going to call him and see if he can come on that day and call us back.  I did tell her that Fraser Din was only doing what she felt best her her father and that going to the ER was the recommendation.  She was appreciative of the call and will call back

## 2018-03-21 NOTE — Telephone Encounter (Deleted)
Returned call to daughter to discuss her concerns in regards to her father.  She and the NP at her office are aware of our recommendations to be evaluated at the ER due to the episode he experienced yesterday.  His daughter states she has spoken to him about this and he is refusing to go.  She says he "is like a [place]".  I reiterated the importance of having him evaluated but she says she is not able to make him go.  I let her know that Dr Angelena Form could see him on 03/27/18 if she would like to discuss his weakness and

## 2018-03-21 NOTE — Telephone Encounter (Signed)
Please see previous phone note.    Entered in this note in error

## 2018-03-21 NOTE — Telephone Encounter (Signed)
I spoke with pt's daughter and scheduled echo for June 7,2019 at 2:00.  Echo was previously planned for July and pt was to be seen after this for 6 month follow up.  Echo moved to June 7 due to recent symptoms.  Appt made for pt to see Dr. Angelena Form on June 10,2019 at 12:00.

## 2018-03-21 NOTE — Telephone Encounter (Signed)
Pt want to come in for the Echo on this Friday. Please call back to advise

## 2018-03-21 NOTE — Telephone Encounter (Signed)
New Message:       Olivia Mackie NP is calling on behalf of the pt needing a sooner appt due to him have fatigue and weakness. Pt states he just doesn't feel right. I have scheduled pt on 7/10 with Lenze but she states she would like sooner if we can. She states we can call pt if we can schedule sooner.

## 2018-03-21 NOTE — Telephone Encounter (Deleted)
New Message:       Olivia Mackie NP is calling on behalf of the pt needing a sooner appt due to him have fatigue and weakness. Pt states he just doesn't feel right. I have scheduled pt on 7/10 with Lenze but she states she would like sooner if we can. She states we can call pt if we can schedule sooner.

## 2018-03-21 NOTE — Telephone Encounter (Signed)
New Message:       Pt c/o Shortness Of Breath: STAT if SOB developed within the last 24 hours or pt is noticeably SOB on the phone  1. Are you currently SOB (can you hear that pt is SOB on the phone)? N/A talking to pt's daughter  2. How long have you been experiencing SOB? 1 week  3. Are you SOB when sitting or when up moving around? both  4. Are you currently experiencing any other symptoms? Nausea/Wheezing/pale in the face/ pt is forgetting what he is doing in mid sentence. Pt's daughter is stating he needs to be seen soon.

## 2018-03-22 ENCOUNTER — Other Ambulatory Visit: Payer: Self-pay | Admitting: Student

## 2018-03-22 DIAGNOSIS — I252 Old myocardial infarction: Secondary | ICD-10-CM | POA: Diagnosis not present

## 2018-03-22 DIAGNOSIS — R4182 Altered mental status, unspecified: Secondary | ICD-10-CM

## 2018-03-22 DIAGNOSIS — J441 Chronic obstructive pulmonary disease with (acute) exacerbation: Secondary | ICD-10-CM | POA: Diagnosis not present

## 2018-03-24 ENCOUNTER — Ambulatory Visit
Admission: RE | Admit: 2018-03-24 | Discharge: 2018-03-24 | Disposition: A | Discharge status: Home / Self Care | Payer: Medicare Other | Source: Ambulatory Visit | Attending: Student | Admitting: Student

## 2018-03-24 ENCOUNTER — Ambulatory Visit (HOSPITAL_COMMUNITY): Payer: Medicare Other | Attending: Internal Medicine

## 2018-03-24 ENCOUNTER — Other Ambulatory Visit: Payer: Self-pay

## 2018-03-24 DIAGNOSIS — I25118 Atherosclerotic heart disease of native coronary artery with other forms of angina pectoris: Secondary | ICD-10-CM | POA: Insufficient documentation

## 2018-03-24 DIAGNOSIS — Z720 Tobacco use: Secondary | ICD-10-CM | POA: Insufficient documentation

## 2018-03-24 DIAGNOSIS — I252 Old myocardial infarction: Secondary | ICD-10-CM | POA: Insufficient documentation

## 2018-03-24 DIAGNOSIS — I350 Nonrheumatic aortic (valve) stenosis: Secondary | ICD-10-CM

## 2018-03-24 DIAGNOSIS — R410 Disorientation, unspecified: Secondary | ICD-10-CM | POA: Diagnosis not present

## 2018-03-24 DIAGNOSIS — E785 Hyperlipidemia, unspecified: Secondary | ICD-10-CM | POA: Diagnosis not present

## 2018-03-24 DIAGNOSIS — I080 Rheumatic disorders of both mitral and aortic valves: Secondary | ICD-10-CM | POA: Insufficient documentation

## 2018-03-24 DIAGNOSIS — I110 Hypertensive heart disease with heart failure: Secondary | ICD-10-CM | POA: Insufficient documentation

## 2018-03-24 DIAGNOSIS — R4182 Altered mental status, unspecified: Secondary | ICD-10-CM | POA: Diagnosis not present

## 2018-03-24 DIAGNOSIS — I5032 Chronic diastolic (congestive) heart failure: Secondary | ICD-10-CM | POA: Diagnosis not present

## 2018-03-27 ENCOUNTER — Encounter: Payer: Self-pay | Admitting: Cardiovascular Disease

## 2018-03-27 ENCOUNTER — Ambulatory Visit (INDEPENDENT_AMBULATORY_CARE_PROVIDER_SITE_OTHER): Payer: Medicare Other | Admitting: Cardiovascular Disease

## 2018-03-27 VITALS — BP 128/70 | HR 73 | Ht 65.0 in | Wt 146.2 lb

## 2018-03-27 DIAGNOSIS — I10 Essential (primary) hypertension: Secondary | ICD-10-CM | POA: Diagnosis not present

## 2018-03-27 DIAGNOSIS — I350 Nonrheumatic aortic (valve) stenosis: Secondary | ICD-10-CM

## 2018-03-27 DIAGNOSIS — E7800 Pure hypercholesterolemia, unspecified: Secondary | ICD-10-CM | POA: Diagnosis not present

## 2018-03-27 DIAGNOSIS — I255 Ischemic cardiomyopathy: Secondary | ICD-10-CM | POA: Diagnosis not present

## 2018-03-27 DIAGNOSIS — I25110 Atherosclerotic heart disease of native coronary artery with unstable angina pectoris: Secondary | ICD-10-CM

## 2018-03-27 NOTE — Patient Instructions (Addendum)
Your physician recommends that you continue on your current medications as directed. Please refer to the Current Medication list given to you today.  Your physician has requested that you have a cardiac catheterization. Cardiac catheterization is used to diagnose and/or treat various heart conditions. Doctors may recommend this procedure for a number of different reasons. The most common reason is to evaluate chest pain. Chest pain can be a symptom of coronary artery disease (CAD), and cardiac catheterization can show whether plaque is narrowing or blocking your heart's arteries. This procedure is also used to evaluate the valves, as well as measure the blood flow and oxygen levels in different parts of your heart. For further information please visit [email or web address]. Please follow instruction sheet, as given.  Your physician recommends that you schedule a follow-up appointment in: 3-4 weeks with APP.  Your physician recommends that you return for lab work today Artist, [place])    [place] OFFICE [address] [place] [address] Dept: [place]: [place]  03/27/2018  You are scheduled for a Cardiac Catheterization on Tuesday, June 18 with Dr. Peter Martinique.  1. Please arrive at the [facility] (Main Entrance A) at [facility]: [address] [place], [place] [address] at 8:00 AM (two hours before your procedure to ensure your preparation). Free valet parking service is available.   Special note: Every effort is made to have your procedure done on time. Please understand that emergencies sometimes delay scheduled procedures.  2. Diet: Do not eat or drink anything after midnight prior to your procedure except sips of water to take medications.  3. Labs: You will need to have blood drawn on Monday, June 10 at [facility]. [address]  Open: 7:30am - 5pm    Phone: [phone]. You do not need to be fasting.  4. Medication instructions in preparation for your procedure:  HOLD LASIX MORNING OF PROCEDURE  On the morning of your procedure, take your Aspirin and Plavix and any morning medicines NOT listed above.  You may use sips of water.  5. Plan for one night stay--bring personal belongings. 6. Bring a current list of your medications and current insurance cards. 7. You MUST have a responsible person to drive you home. 8. Someone MUST be with you the first 24 hours after you arrive home or your discharge will be delayed. 9. Please wear clothes that are easy to get on and off and wear slip-on shoes.  Thank you for allowing [place] to care for you!   -- [facility]

## 2018-03-27 NOTE — H&P (View-Only) (Signed)
Chief Complaint  Patient presents with  . Follow-up    CAD   History of Present Illness: 72 yo male with history of CAD s/p CABG in 1993, HTN, HLD, aortic stenosis and ongoing tobacco abuse here today for follow up. He has severe CAD. He was admitted October 2013 and cath performed with occlusion of LAD, patent IMA to LAD, occluded proximal RCA with occluded SVG to PDA, severe disease in left main into Circumflex with occluded vein graft to Circumflex. Re-do CABG suggested but his aorta was severely calcified. He was seen by Dr. Servando Snare with CT surgery and not felt to be a candidate for re-do bypass. Imdur was added. No intervention performed. He was admitted October 2014 with a NSTEMI. Cardiac cath with 70% left main stenosis, LAD occluded, mid LAD 99 after anastomosis of the graft, prox Circumflex 50% , OM stent with 70% ISR, pRCA occluded, SVG-PDA occluded, SVG-OM occluded, LIMA-LAD patent. A drug eluting stent was placed in the mid LAD extending back into the IMA graft. He was readmitted May 2015 with an inferolateral STEMI and found to have progression of disease in the left main/proximal Circumflex stent. A drug eluting stent was placed from the Circumflex back into the left main. He was readmitted October 2015 with unstable angina. Cardiac cath with stenosis left main stent and OM 1. A drug eluting stent was placed in the first OM branch. The left main stent restenosis was treated with balloon angioplasty. Repeat caths in April 2016 and June 2016 with stable disease. Despite all of his coronary interventions, he continues to smoke. He has been tried on Ranexa but stopped due to cost. His most recent cardiac cath was in July 2017 and showed stable CAD. Echo July 2018 with LVEF=45-50%, moderate AS, mild AI.     He is here today for follow up. He has had recent worsening of his dyspnea. He has had episodes of confusion. His daughter called here last week and was told to take him to the ED. The  patient refused. He was seen in primary care on 03/17/18 for workup of dyspnea. Chest x-ray 03/17/18 with evidence of COPD but no acute changes. He was treated for a COPD exacerbation and notes mild improvement in dyspnea but he is still having dyspnea even with talking. The patient denies any palpitations, lower extremity edema, dizziness, near syncope or syncope.    Primary Care Physician: Townsend Roger, MD   Past Medical History:  Diagnosis Date  . Abnormal CT scan, kidney    07/2012 - multiple small cysts  . Aortic stenosis    a. Mod AS/AI by cath 07/2012.;  b.  Echo (07/31/13) is: EF 55-60%, mild aortic stenosis (mean gradient 13);  c. Echo (5/15):  EF 40-45%, mild AS (mean 12 mmHg), mod AI  . Back pain   . Carotid artery occlusion    a. Dopplers 07/2012: no significant high grade obstruction.  . Cholelithiasis    Seen on CT 07/2012  . Coronary artery disease    a. CABG 10/1991. b. cath 07/2012 with prog dsz, turned down for re-do CABG - for med rx for now.; c. NSTEMI/PCI: DES to mLAD ext into IMA graft;  d. Inf STEMI (5/15):  LM 60-70% then 99% before LAD, pLAD occl, pCFX stent 80+% ISR, oRCA 99% then occl (L-R collats to dRCA), L-LAD ok with patent stent, S-CFX occl (old), S-RCA occl (old); PCI: LM ext into CFX with Xience Alpine DES  . Heart failure (  [place]) systolic  . Hyperlipidemia   . Hyperlipoproteinemia   . Hypertension   . Ischemic cardiomyopathy    a. 2D ECHO: 08/02/2014; EF 45%; severe hypokinesis base/mid-inferolat segments and severe hypokinesis base inferior segment. Mild LVH, mild AS (may be underestimated due to LV dysfunction).  Mean gradient (S): 14 mm Hg. Peak gradient (S): 25 mm Hg. VTI ratio of LVOT to aortic valve: 0.37. Mod LA dilation. Mild RV systolic dysfxn     Past Surgical History:  Procedure Laterality Date  . CARDIAC CATHETERIZATION  04/03/99  . CARDIAC CATHETERIZATION N/A 03/20/2015   Procedure: Left Heart Cath and Cors/Grafts Angiography;  Surgeon: Jettie Booze, MD;  Location: [facility];  Service: Cardiovascular;  Laterality: N/A;  . CARDIAC CATHETERIZATION N/A 04/28/2016   Procedure: Right/Left Heart Cath and Coronary/Graft Angiography;  Surgeon: Burnell Blanks, MD;  Location: [facility];  Service: Cardiovascular;  Laterality: N/A;  . CARPAL TUNNEL RELEASE  2007   Excision mass dorsal left wrist  . CORONARY ARTERY BYPASS GRAFT  1993  . FASCIOTOMY Right 07/30/2013   Procedure: OPEN FASCIOTOMY RIGHT RING FINGER & RIGHT SMALL FINGER MULTIPLE LEVELS;  Surgeon: Wynonia Sours, MD;  Location: [facility];  Service: Orthopedics;  Laterality: Right;  . HERNIA REPAIR  1988  . LEFT HEART CATHETERIZATION WITH CORONARY ANGIOGRAM N/A 02/28/2014   Procedure: LEFT HEART CATHETERIZATION WITH CORONARY ANGIOGRAM;  Surgeon: Troy Sine, MD;  Location: [facility] CATH LAB;  Service: Cardiovascular;  Laterality: N/A;  . LEFT HEART CATHETERIZATION WITH CORONARY/GRAFT ANGIOGRAM N/A 08/10/2012   Procedure: LEFT HEART CATHETERIZATION WITH Beatrix Fetters;  Surgeon: Hillary Bow, MD;  Location: [facility] CATH LAB;  Service: Cardiovascular;  Laterality: N/A;  . LEFT HEART CATHETERIZATION WITH CORONARY/GRAFT ANGIOGRAM N/A 08/01/2014   Procedure: LEFT HEART CATHETERIZATION WITH Beatrix Fetters;  Surgeon: Leonie Man, MD;  Location: [facility] CATH LAB;  Service: Cardiovascular;  Laterality: N/A;  . LEFT HEART CATHETERIZATION WITH CORONARY/GRAFT ANGIOGRAM N/A 02/12/2015   Procedure: LEFT HEART CATHETERIZATION WITH Beatrix Fetters;  Surgeon: Burnell Blanks, MD;  Location: [facility] CATH LAB;  Service: Cardiovascular;  Laterality: N/A;  . PERCUTANEOUS CORONARY STENT INTERVENTION (PCI-S)  07/31/2013   Procedure: PERCUTANEOUS CORONARY STENT INTERVENTION (PCI-S);  Surgeon: Burnell Blanks, MD;  Location: [facility] CATH LAB;  Service: Cardiovascular;;  LIMA to LAD at the anastomosis with aortic root shot     Current Outpatient  Medications  Medication Sig Dispense Refill  . allopurinol (ZYLOPRIM) 100 MG tablet Take 100 mg by mouth daily.  4  . amLODipine (NORVASC) 5 MG tablet Take 1 tablet (5 mg total) by mouth daily. 90 tablet 3  . aspirin EC 81 MG tablet Take 81 mg by mouth at bedtime.    Marland Kitchen atorvastatin (LIPITOR) 80 MG tablet TAKE ONE (1) TABLET EACH DAY 90 tablet 3  . bisoprolol (ZEBETA) 5 MG tablet Take 0.5 tablets (2.5 mg total) by mouth 2 (two) times daily. 90 tablet 3  . clopidogrel (PLAVIX) 75 MG tablet Take 1 tablet (75 mg total) by mouth daily. 90 tablet 3  . fexofenadine (ALLEGRA) 180 MG tablet Take 180 mg by mouth daily as needed for allergies.     . furosemide (LASIX) 40 MG tablet Take 1 tablet (40 mg total) by mouth 2 (two) times daily. 180 tablet 2  . ipratropium-albuterol (DUONEB) 0.5-2.5 (3) MG/3ML SOLN Take 3 mLs by nebulization every 6 (six) hours as needed.    . isosorbide mononitrate (IMDUR) 60  MG 24 hr tablet Take 1 tablet (60 mg total) by mouth 2 (two) times daily. 180 tablet 2  . nitroGLYCERIN (NITROSTAT) 0.4 MG SL tablet TAKE 1 TABLET UNDER THE TONGUE AS NEEDEDFOR CHEST PAIN ( MAY REPEAT EVERY 5 MINUTES X 3) 25 tablet 6  . pantoprazole (PROTONIX) 40 MG tablet TAKE ONE TABLET BY MOUTH ONCE DAILY 30 tablet 11  . potassium chloride SA (K-DUR,KLOR-CON) 20 MEQ tablet TAKE 1 TABLET BY MOUTH ONCE DAILY 90 tablet 2  . PROAIR HFA 108 (90 Base) MCG/ACT inhaler Take 2 puffs by mouth as needed for shortness of breath. Pt uses every 4-6 hours as need for sob  3  . spironolactone (ALDACTONE) 25 MG tablet TAKE 1/2 TABLET BY MOUTH TWICE DAILY 90 tablet 2   No current facility-administered medications for this visit.     No Known Allergies  Social History   Socioeconomic History  . Marital status: Married    Spouse name: Not on file  . Number of children: 1  . Years of education: Not on file  . Highest education level: Not on file  Occupational History  . Occupation: Plumbing    Comment: Retired in  2007  Social Needs  . Financial resource strain: Not on file  . Food insecurity:    Worry: Not on file    Inability: Not on file  . Transportation needs:    Medical: Not on file    Non-medical: Not on file  Tobacco Use  . Smoking status: Current Every Day Smoker    Packs/day: 1.00    Years: 57.00    Pack years: 57.00    Types: Cigarettes  . Smokeless tobacco: Never Used  Substance and Sexual Activity  . Alcohol use: Yes    Alcohol/week: 3.0 oz    Types: 5 Cans of beer per week    Comment: daily-6 daily  . Drug use: No  . Sexual activity: Not on file  Lifestyle  . Physical activity:    Days per week: Not on file    Minutes per session: Not on file  . Stress: Not on file  Relationships  . Social connections:    Talks on phone: Not on file    Gets together: Not on file    Attends religious service: Not on file    Active member of club or organization: Not on file    Attends meetings of clubs or organizations: Not on file    Relationship status: Not on file  . Intimate partner violence:    Fear of current or ex partner: Not on file    Emotionally abused: Not on file    Physically abused: Not on file    Forced sexual activity: Not on file  Other Topics Concern  . Not on file  Social History Narrative   Lives in [place] with his family.  Does not routinely exercise.    Family History  Problem Relation Age of Onset  . Heart attack Mother        died @ 8.  Marland Kitchen Hypertension Mother   . Hypertension Father   . Hypertension Sister   . Emphysema Brother   . Hypertension Brother   . Allergies Sister   . Stroke Neg Hx     Review of Systems:  As stated in the HPI and otherwise negative.   BP 128/70   Pulse 73   Ht 5\' 5"  (1.651 m)   Wt 146 lb 3.2 oz (66.3 kg)   SpO2  97%   BMI 24.33 kg/m   Physical Examination:  General: Well developed, well nourished, NAD  HEENT: OP clear, mucus membranes moist  SKIN: warm, dry. No rashes. Neuro: No focal deficits    Musculoskeletal: Muscle strength 5/5 all ext  Psychiatric: Mood and affect normal  Neck: No JVD, no carotid bruits, no thyromegaly, no lymphadenopathy.  Lungs:Poor air movement bilateral lungs.  Cardiovascular: Regular rate and rhythm. Loud systolic murmur noted.  Abdomen:Soft. Bowel sounds present. Non-tender.  Extremities: No lower extremity edema. Pulses are 2 + in the bilateral DP/PT.  Cardiac Cath July 2017:  1. Severe triple vessel CAD s/p 3V CABG. The only patent graft is the LIMA to the LAD.  2. Chronic occlusion ostial RCA (not injected). The distal RCA/PDA fills from left to right collaterals from the LAD and from the Circumflex.  3. Patent left main stent into the proximal Circumflex and first obtuse marginal branch. There is mild restenosis in this stented segment but it does not appear to be flow limiting.  4. Chronic occlusion proximal LAD. The mid and distal LAD fills from the patent LIMA graft. The stent from the LAD into the LIMA graft is patent without restenosis.  5. Mild LV systolic dysfunction by LV gram. (Echo yesterday with normal LV function. ) 6. NO significant gradient across the aortic valve.   Echo June 2019:  - Left ventricle: The cavity size was normal. Wall thickness was   increased in a pattern of mild LVH. Systolic function was normal.   The estimated ejection fraction was in the range of 50% to 55%.   Inferoseptal hypokinesis. The study is not technically sufficient   to allow evaluation of LV diastolic function. - Aortic valve: Moderate stenosis and moderate regurgitation. Mean   gradient (S): 20 mm Hg. Peak gradient (S): 43 mm Hg. - Mitral valve: Calcified annulus. Mildly thickened leaflets .   There was mild regurgitation. - Left atrium: Moderately dilated. - Right ventricle: The cavity size was normal. Systolic function   was low normal. - Inferior vena cava: The vessel was normal in size. The   respirophasic diameter changes were in the normal  range (>= 50%),   consistent with normal central venous pressure.  Impressions:  - LVEF 50-55%, mild LVH, inferoseptal hypokinesis, moderate aortic   stenosis with moderate AI and mean gradient of 20 mmHg, mild MR,   moderate LAE, noraml IvC.   EKG:  The EKG is ordered today.  This demonstrates Sinus, rate 73 bpm. PACs. LVH  Recent Labs: 06/23/2017: BUN 22; Creatinine, Ser 1.28; Potassium 4.2; Sodium 139   Lipid Panel    Component Value Date/Time   CHOL 137 08/18/2015 1056   CHOL 146 03/31/2015 0905   TRIG 128 08/18/2015 1056   HDL 42 08/18/2015 1056   HDL 42 03/31/2015 0905   CHOLHDL 3.3 08/18/2015 1056   VLDL 26 08/18/2015 1056   LDLCALC 69 08/18/2015 1056   LDLCALC 77 03/31/2015 0905     Wt Readings from Last 3 Encounters:  03/27/18 146 lb 3.2 oz (66.3 kg)  11/02/17 147 lb 6.4 oz (66.9 kg)  04/27/17 149 lb (67.6 kg)     Other studies Reviewed: Additional studies/ records that were reviewed today include:  Review of the above records demonstrates:    Assessment and Plan:   1. CAD s/p CABG with unstable angina: He had CABG in 1993 and has had multiple stenting procedures since then. Last cath in July 2017 with stable CAD. The  LIMA to his LAD is patent, stent from LM to Circ is patent, RCA occluded with filling by collaterals. He refuses to stop smoking. He is now having more dyspnea. This may be related to his COPD but cannot exclude angina. Prior anginal equivalent was dyspnea. Will plan right and left heart cath at [facility] next week. He refused cath this week and refused admission today. Will continue ASA, Plavix for lifetime, statin, beta blocker, Norvasc and Imdur.    Pre cath labs today.   2. Hyperlipidemia: Lipids followed in primary care (Last LDL 86, HDL 56, TC 163 in January 2019). Continue statin.  3. Aortic valve disease: Moderate AS and AI by echo June 2019. Will repeat echo in one year.     4. HTN: BP is controlled.   5. Tobacco abuse: Every visit we  discuss this but he refuses to stop smoking. I asked him again to quit  6. Ischemic cardiomyopathy/Chronic diastolic CHF: SFKC=12-75% by echo June 2019. Weight is stable. No changes in medical therapy.     7. Dyspnea: See above. Likely related to COPD exacerbation but cannot exclude cardiac component. Weight is not up. No signs of CHF. Plan right and left heart cath at [facility] next week with Dr. [place] since I am not in the cath lab again for 16 days from now.   Current medicines are reviewed at length with the patient today.  The patient does not have concerns regarding medicines.  The following changes have been made:    Labs/ tests ordered today include:   Orders Placed This Encounter  Procedures  . Basic metabolic panel  . CBC    Disposition:   FU with me 6 months  Signed, Lauree Chandler, MD 03/27/2018 1:38 PM    [facility] [address], [place], [place]  [address] Phone: [phone]; Fax: [phone]

## 2018-03-27 NOTE — Progress Notes (Signed)
Chief Complaint  Patient presents with  . Follow-up    CAD   History of Present Illness: 72 yo male with history of CAD s/p CABG in 1993, HTN, HLD, aortic stenosis and ongoing tobacco abuse here today for follow up. He has severe CAD. He was admitted October 2013 and cath performed with occlusion of LAD, patent IMA to LAD, occluded proximal RCA with occluded SVG to PDA, severe disease in left main into Circumflex with occluded vein graft to Circumflex. Re-do CABG suggested but his aorta was severely calcified. He was seen by Dr. Servando Snare with CT surgery and not felt to be a candidate for re-do bypass. Imdur was added. No intervention performed. He was admitted October 2014 with a NSTEMI. Cardiac cath with 70% left main stenosis, LAD occluded, mid LAD 99 after anastomosis of the graft, prox Circumflex 50% , OM stent with 70% ISR, pRCA occluded, SVG-PDA occluded, SVG-OM occluded, LIMA-LAD patent. A drug eluting stent was placed in the mid LAD extending back into the IMA graft. He was readmitted May 2015 with an inferolateral STEMI and found to have progression of disease in the left main/proximal Circumflex stent. A drug eluting stent was placed from the Circumflex back into the left main. He was readmitted October 2015 with unstable angina. Cardiac cath with stenosis left main stent and OM 1. A drug eluting stent was placed in the first OM branch. The left main stent restenosis was treated with balloon angioplasty. Repeat caths in April 2016 and June 2016 with stable disease. Despite all of his coronary interventions, he continues to smoke. He has been tried on Ranexa but stopped due to cost. His most recent cardiac cath was in July 2017 and showed stable CAD. Echo July 2018 with LVEF=45-50%, moderate AS, mild AI.     He is here today for follow up. He has had recent worsening of his dyspnea. He has had episodes of confusion. His daughter called here last week and was told to take him to the ED. The  patient refused. He was seen in primary care on 03/17/18 for workup of dyspnea. Chest x-ray 03/17/18 with evidence of COPD but no acute changes. He was treated for a COPD exacerbation and notes mild improvement in dyspnea but he is still having dyspnea even with talking. The patient denies any palpitations, lower extremity edema, dizziness, near syncope or syncope.    Primary Care Physician: Townsend Roger, MD   Past Medical History:  Diagnosis Date  . Abnormal CT scan, kidney    07/2012 - multiple small cysts  . Aortic stenosis    a. Mod AS/AI by cath 07/2012.;  b.  Echo (07/31/13) is: EF 55-60%, mild aortic stenosis (mean gradient 13);  c. Echo (5/15):  EF 40-45%, mild AS (mean 12 mmHg), mod AI  . Back pain   . Carotid artery occlusion    a. Dopplers 07/2012: no significant high grade obstruction.  . Cholelithiasis    Seen on CT 07/2012  . Coronary artery disease    a. CABG 10/1991. b. cath 07/2012 with prog dsz, turned down for re-do CABG - for med rx for now.; c. NSTEMI/PCI: DES to mLAD ext into IMA graft;  d. Inf STEMI (5/15):  LM 60-70% then 99% before LAD, pLAD occl, pCFX stent 80+% ISR, oRCA 99% then occl (L-R collats to dRCA), L-LAD ok with patent stent, S-CFX occl (old), S-RCA occl (old); PCI: LM ext into CFX with Xience Alpine DES  . Heart failure (  [place]) systolic  . Hyperlipidemia   . Hyperlipoproteinemia   . Hypertension   . Ischemic cardiomyopathy    a. 2D ECHO: 08/02/2014; EF 45%; severe hypokinesis base/mid-inferolat segments and severe hypokinesis base inferior segment. Mild LVH, mild AS (may be underestimated due to LV dysfunction).  Mean gradient (S): 14 mm Hg. Peak gradient (S): 25 mm Hg. VTI ratio of LVOT to aortic valve: 0.37. Mod LA dilation. Mild RV systolic dysfxn     Past Surgical History:  Procedure Laterality Date  . CARDIAC CATHETERIZATION  04/03/99  . CARDIAC CATHETERIZATION N/A 03/20/2015   Procedure: Left Heart Cath and Cors/Grafts Angiography;  Surgeon: Jettie Booze, MD;  Location: [place] [facility];  Service: Cardiovascular;  Laterality: N/A;  . CARDIAC CATHETERIZATION N/A 04/28/2016   Procedure: Right/Left Heart Cath and Coronary/Graft Angiography;  Surgeon: Burnell Blanks, MD;  Location: [place] [facility];  Service: Cardiovascular;  Laterality: N/A;  . CARPAL TUNNEL RELEASE  2007   Excision mass dorsal left wrist  . CORONARY ARTERY BYPASS GRAFT  1993  . FASCIOTOMY Right 07/30/2013   Procedure: OPEN FASCIOTOMY RIGHT RING FINGER & RIGHT SMALL FINGER MULTIPLE LEVELS;  Surgeon: Wynonia Sours, MD;  Location: [facility];  Service: Orthopedics;  Laterality: Right;  . HERNIA REPAIR  1988  . LEFT HEART CATHETERIZATION WITH CORONARY ANGIOGRAM N/A 02/28/2014   Procedure: LEFT HEART CATHETERIZATION WITH CORONARY ANGIOGRAM;  Surgeon: Troy Sine, MD;  Location: [facility] C[facility] LAB;  Service: Cardiovascular;  Laterality: N/A;  . LEFT HEART CATHETERIZATION WITH CORONARY/GRAFT ANGIOGRAM N/A 08/10/2012   Procedure: LEFT HEART CATHETERIZATION WITH Beatrix Fetters;  Surgeon: Hillary Bow, MD;  Location: [facility] CATH LAB;  Service: Cardiovascular;  Laterality: N/A;  . LEFT HEART CATHETERIZATION WITH CORONARY/GRAFT ANGIOGRAM N/A 08/01/2014   Procedure: LEFT HEART CATHETERIZATION WITH Beatrix Fetters;  Surgeon: Leonie Man, MD;  Location: [facility] CATH LAB;  Service: Cardiovascular;  Laterality: N/A;  . LEFT HEART CATHETERIZATION WITH CORONARY/GRAFT ANGIOGRAM N/A 02/12/2015   Procedure: LEFT HEART CATHETERIZATION WITH Beatrix Fetters;  Surgeon: Burnell Blanks, MD;  Location: [facility] CATH LAB;  Service: Cardiovascular;  Laterality: N/A;  . PERCUTANEOUS CORONARY STENT INTERVENTION (PCI-S)  07/31/2013   Procedure: PERCUTANEOUS CORONARY STENT INTERVENTION (PCI-S);  Surgeon: Burnell Blanks, MD;  Location: [facility] CATH LAB;  Service: Cardiovascular;;  LIMA to LAD at the anastomosis with aortic root shot     Current Outpatient  Medications  Medication Sig Dispense Refill  . allopurinol (ZYLOPRIM) 100 MG tablet Take 100 mg by mouth daily.  4  . amLODipine (NORVASC) 5 MG tablet Take 1 tablet (5 mg total) by mouth daily. 90 tablet 3  . aspirin EC 81 MG tablet Take 81 mg by mouth at bedtime.    Marland Kitchen atorvastatin (LIPITOR) 80 MG tablet TAKE ONE (1) TABLET EACH DAY 90 tablet 3  . bisoprolol (ZEBETA) 5 MG tablet Take 0.5 tablets (2.5 mg total) by mouth 2 (two) times daily. 90 tablet 3  . clopidogrel (PLAVIX) 75 MG tablet Take 1 tablet (75 mg total) by mouth daily. 90 tablet 3  . fexofenadine (ALLEGRA) 180 MG tablet Take 180 mg by mouth daily as needed for allergies.     . furosemide (LASIX) 40 MG tablet Take 1 tablet (40 mg total) by mouth 2 (two) times daily. 180 tablet 2  . ipratropium-albuterol (DUONEB) 0.5-2.5 (3) MG/3ML SOLN Take 3 mLs by nebulization every 6 (six) hours as needed.    . isosorbide mononitrate (IMDUR) 60  MG 24 hr tablet Take 1 tablet (60 mg total) by mouth 2 (two) times daily. 180 tablet 2  . nitroGLYCERIN (NITROSTAT) 0.4 MG SL tablet TAKE 1 TABLET UNDER THE TONGUE AS NEEDEDFOR CHEST PAIN ( MAY REPEAT EVERY 5 MINUTES X 3) 25 tablet 6  . pantoprazole (PROTONIX) 40 MG tablet TAKE ONE TABLET BY MOUTH ONCE DAILY 30 tablet 11  . potassium chloride SA (K-DUR,KLOR-CON) 20 MEQ tablet TAKE 1 TABLET BY MOUTH ONCE DAILY 90 tablet 2  . PROAIR HFA 108 (90 Base) MCG/ACT inhaler Take 2 puffs by mouth as needed for shortness of breath. Pt uses every 4-6 hours as need for sob  3  . spironolactone (ALDACTONE) 25 MG tablet TAKE 1/2 TABLET BY MOUTH TWICE DAILY 90 tablet 2   No current facility-administered medications for this visit.     No Known Allergies  Social History   Socioeconomic History  . Marital status: Married    Spouse name: Not on file  . Number of children: 1  . Years of education: Not on file  . Highest education level: Not on file  Occupational History  . Occupation: Plumbing    Comment: Retired in  2007  Social Needs  . Financial resource strain: Not on file  . Food insecurity:    Worry: Not on file    Inability: Not on file  . Transportation needs:    Medical: Not on file    Non-medical: Not on file  Tobacco Use  . Smoking status: Current Every Day Smoker    Packs/day: 1.00    Years: 57.00    Pack years: 57.00    Types: Cigarettes  . Smokeless tobacco: Never Used  Substance and Sexual Activity  . Alcohol use: Yes    Alcohol/week: 3.0 oz    Types: 5 Cans of beer per week    Comment: daily-6 daily  . Drug use: No  . Sexual activity: Not on file  Lifestyle  . Physical activity:    Days per week: Not on file    Minutes per session: Not on file  . Stress: Not on file  Relationships  . Social connections:    Talks on phone: Not on file    Gets together: Not on file    Attends religious service: Not on file    Active member of club or organization: Not on file    Attends meetings of clubs or organizations: Not on file    Relationship status: Not on file  . Intimate partner violence:    Fear of current or ex partner: Not on file    Emotionally abused: Not on file    Physically abused: Not on file    Forced sexual activity: Not on file  Other Topics Concern  . Not on file  Social History Narrative   Lives in [place] with his family.  Does not routinely exercise.    Family History  Problem Relation Age of Onset  . Heart attack Mother        died @ 31.  Marland Kitchen Hypertension Mother   . Hypertension Father   . Hypertension Sister   . Emphysema Brother   . Hypertension Brother   . Allergies Sister   . Stroke Neg Hx     Review of Systems:  As stated in the HPI and otherwise negative.   BP 128/70   Pulse 73   Ht 5\' 5"  (1.651 m)   Wt 146 lb 3.2 oz (66.3 kg)   SpO2  97%   BMI 24.33 kg/m   Physical Examination:  General: Well developed, well nourished, NAD  HEENT: OP clear, mucus membranes moist  SKIN: warm, dry. No rashes. Neuro: No focal deficits    Musculoskeletal: Muscle strength 5/5 all ext  Psychiatric: Mood and affect normal  Neck: No JVD, no carotid bruits, no thyromegaly, no lymphadenopathy.  Lungs:Poor air movement bilateral lungs.  Cardiovascular: Regular rate and rhythm. Loud systolic murmur noted.  Abdomen:Soft. Bowel sounds present. Non-tender.  Extremities: No lower extremity edema. Pulses are 2 + in the bilateral DP/PT.  Cardiac Cath July 2017:  1. Severe triple vessel CAD s/p 3V CABG. The only patent graft is the LIMA to the LAD.  2. Chronic occlusion ostial RCA (not injected). The distal RCA/PDA fills from left to right collaterals from the LAD and from the Circumflex.  3. Patent left main stent into the proximal Circumflex and first obtuse marginal branch. There is mild restenosis in this stented segment but it does not appear to be flow limiting.  4. Chronic occlusion proximal LAD. The mid and distal LAD fills from the patent LIMA graft. The stent from the LAD into the LIMA graft is patent without restenosis.  5. Mild LV systolic dysfunction by LV gram. (Echo yesterday with normal LV function. ) 6. NO significant gradient across the aortic valve.   Echo June 2019:  - Left ventricle: The cavity size was normal. Wall thickness was   increased in a pattern of mild LVH. Systolic function was normal.   The estimated ejection fraction was in the range of 50% to 55%.   Inferoseptal hypokinesis. The study is not technically sufficient   to allow evaluation of LV diastolic function. - Aortic valve: Moderate stenosis and moderate regurgitation. Mean   gradient (S): 20 mm Hg. Peak gradient (S): 43 mm Hg. - Mitral valve: Calcified annulus. Mildly thickened leaflets .   There was mild regurgitation. - Left atrium: Moderately dilated. - Right ventricle: The cavity size was normal. Systolic function   was low normal. - Inferior vena cava: The vessel was normal in size. The   respirophasic diameter changes were in the normal  range (>= 50%),   consistent with normal central venous pressure.  Impressions:  - LVEF 50-55%, mild LVH, inferoseptal hypokinesis, moderate aortic   stenosis with moderate AI and mean gradient of 20 mmHg, mild MR,   moderate LAE, noraml IvC.   EKG:  The EKG is ordered today.  This demonstrates Sinus, rate 73 bpm. PACs. LVH  Recent Labs: 06/23/2017: BUN 22; Creatinine, Ser 1.28; Potassium 4.2; Sodium 139   Lipid Panel    Component Value Date/Time   CHOL 137 08/18/2015 1056   CHOL 146 03/31/2015 0905   TRIG 128 08/18/2015 1056   HDL 42 08/18/2015 1056   HDL 42 03/31/2015 0905   CHOLHDL 3.3 08/18/2015 1056   VLDL 26 08/18/2015 1056   LDLCALC 69 08/18/2015 1056   LDLCALC 77 03/31/2015 0905     Wt Readings from Last 3 Encounters:  03/27/18 146 lb 3.2 oz (66.3 kg)  11/02/17 147 lb 6.4 oz (66.9 kg)  04/27/17 149 lb (67.6 kg)     Other studies Reviewed: Additional studies/ records that were reviewed today include:  Review of the above records demonstrates:    Assessment and Plan:   1. CAD s/p CABG with unstable angina: He had CABG in 1993 and has had multiple stenting procedures since then. Last cath in July 2017 with stable CAD. The  LIMA to his LAD is patent, stent from LM to Circ is patent, RCA occluded with filling by collaterals. He refuses to stop smoking. He is now having more dyspnea. This may be related to his COPD but cannot exclude angina. Prior anginal equivalent was dyspnea. Will plan right and left heart cath at [facility] next week. He refused cath this week and refused admission today. Will continue ASA, Plavix for lifetime, statin, beta blocker, Norvasc and Imdur.    Pre cath labs today.   2. Hyperlipidemia: Lipids followed in primary care (Last LDL 86, HDL 56, TC 163 in January 2019). Continue statin.  3. Aortic valve disease: Moderate AS and AI by echo June 2019. Will repeat echo in one year.     4. HTN: BP is controlled.   5. Tobacco abuse: Every visit we  discuss this but he refuses to stop smoking. I asked him again to quit  6. Ischemic cardiomyopathy/Chronic diastolic CHF: CNOB=09-62% by echo June 2019. Weight is stable. No changes in medical therapy.     7. Dyspnea: See above. Likely related to COPD exacerbation but cannot exclude cardiac component. Weight is not up. No signs of CHF. Plan right and left heart cath at [facility] next week with Dr. Martinique since I am not in the cath lab again for 16 days from now.   Current medicines are reviewed at length with the patient today.  The patient does not have concerns regarding medicines.  The following changes have been made:    Labs/ tests ordered today include:   Orders Placed This Encounter  Procedures  . Basic metabolic panel  . CBC    Disposition:   FU with me 6 months  Signed, Lauree Chandler, MD 03/27/2018 1:38 PM    [facility] [place], [place], [place]  [address] Phone: [phone]; Fax: [phone]

## 2018-03-28 LAB — CBC
Hematocrit: 43.9 % (ref 37.5–51.0)
Hemoglobin: 14.6 g/dL (ref 13.0–17.7)
MCH: 32.6 pg (ref 26.6–33.0)
MCHC: 33.3 g/dL (ref 31.5–35.7)
MCV: 98 fL — AB (ref 79–97)
Platelets: 354 10*3/uL (ref 150–450)
RBC: 4.48 x10E6/uL (ref 4.14–5.80)
RDW: 14.1 % (ref 12.3–15.4)
WBC: 15.1 10*3/uL — AB (ref 3.4–10.8)

## 2018-03-28 LAB — BASIC METABOLIC PANEL
BUN / CREAT RATIO: 15 (ref 10–24)
BUN: 18 mg/dL (ref 8–27)
CO2: 23 mmol/L (ref 20–29)
CREATININE: 1.17 mg/dL (ref 0.76–1.27)
Calcium: 8.9 mg/dL (ref 8.6–10.2)
Chloride: 101 mmol/L (ref 96–106)
GFR calc Af Amer: 71 mL/min/{1.73_m2} (ref >59)
GFR calc non Af Amer: 61 mL/min/{1.73_m2} (ref >59)
GLUCOSE: 73 mg/dL (ref 65–99)
Potassium: 4.4 mmol/L (ref 3.5–5.2)
Sodium: 141 mmol/L (ref 134–144)

## 2018-03-28 NOTE — Addendum Note (Signed)
Addended by: Mendel Ryder on: 03/28/2018 09:00 AM   Modules accepted: Orders

## 2018-04-03 ENCOUNTER — Telehealth: Payer: Self-pay | Admitting: *Deleted

## 2018-04-03 NOTE — Telephone Encounter (Addendum)
Catheterization scheduled at [facility] for: Tuesday April 04, 2018 10:30 AM Verified arrival time and place: [place] Entrance A at: 8 AM  No solid food after midnight prior to cath, clear liquids until 5 AM day of procedure. Verify allergies in Epic Verify no diabetes medications.  Hold: Furosemide AM of procedure KCl AM of procedure. Spironolactone AM of procedure.  Except hold medications AM meds can be  taken pre-cath with sip of water including: ASA 81 mg Clopidogrel 75 mg   03/27/18 WBC 15.1--pt denies temperature/fever, pt states he was probably taking prednisone when this lab was done.  Confirm patient has responsible person to drive home post procedure and observe patient for 24 hours: yes  Discussed instructions with patient, he verbalized understanding, thanked me for call.

## 2018-04-04 ENCOUNTER — Ambulatory Visit (HOSPITAL_COMMUNITY)
Admission: RE | Disposition: A | Discharge status: Home / Self Care | Payer: Self-pay | Source: Ambulatory Visit | Attending: Cardiology

## 2018-04-04 ENCOUNTER — Ambulatory Visit (HOSPITAL_COMMUNITY)
Admission: RE | Admit: 2018-04-04 | Discharge: 2018-04-04 | Disposition: A | Discharge status: Home / Self Care | Payer: Medicare Other | Source: Ambulatory Visit | Attending: Cardiology | Admitting: Cardiology

## 2018-04-04 ENCOUNTER — Encounter (HOSPITAL_COMMUNITY): Payer: Self-pay | Admitting: *Deleted

## 2018-04-04 DIAGNOSIS — Z7982 Long term (current) use of aspirin: Secondary | ICD-10-CM | POA: Diagnosis not present

## 2018-04-04 DIAGNOSIS — Z7902 Long term (current) use of antithrombotics/antiplatelets: Secondary | ICD-10-CM | POA: Insufficient documentation

## 2018-04-04 DIAGNOSIS — I350 Nonrheumatic aortic (valve) stenosis: Secondary | ICD-10-CM | POA: Insufficient documentation

## 2018-04-04 DIAGNOSIS — F17210 Nicotine dependence, cigarettes, uncomplicated: Secondary | ICD-10-CM | POA: Diagnosis not present

## 2018-04-04 DIAGNOSIS — R0600 Dyspnea, unspecified: Secondary | ICD-10-CM | POA: Diagnosis present

## 2018-04-04 DIAGNOSIS — Y831 Surgical operation with implant of artificial internal device as the cause of abnormal reaction of the patient, or of later complication, without mention of misadventure at the time of the procedure: Secondary | ICD-10-CM | POA: Diagnosis not present

## 2018-04-04 DIAGNOSIS — I110 Hypertensive heart disease with heart failure: Secondary | ICD-10-CM | POA: Insufficient documentation

## 2018-04-04 DIAGNOSIS — Z955 Presence of coronary angioplasty implant and graft: Secondary | ICD-10-CM | POA: Diagnosis not present

## 2018-04-04 DIAGNOSIS — R0602 Shortness of breath: Secondary | ICD-10-CM | POA: Diagnosis not present

## 2018-04-04 DIAGNOSIS — I25810 Atherosclerosis of coronary artery bypass graft(s) without angina pectoris: Secondary | ICD-10-CM | POA: Diagnosis not present

## 2018-04-04 DIAGNOSIS — I10 Essential (primary) hypertension: Secondary | ICD-10-CM | POA: Diagnosis present

## 2018-04-04 DIAGNOSIS — I255 Ischemic cardiomyopathy: Secondary | ICD-10-CM | POA: Diagnosis not present

## 2018-04-04 DIAGNOSIS — Z8249 Family history of ischemic heart disease and other diseases of the circulatory system: Secondary | ICD-10-CM | POA: Insufficient documentation

## 2018-04-04 DIAGNOSIS — I5022 Chronic systolic (congestive) heart failure: Secondary | ICD-10-CM | POA: Diagnosis present

## 2018-04-04 DIAGNOSIS — I5032 Chronic diastolic (congestive) heart failure: Secondary | ICD-10-CM | POA: Diagnosis not present

## 2018-04-04 DIAGNOSIS — E785 Hyperlipidemia, unspecified: Secondary | ICD-10-CM | POA: Diagnosis not present

## 2018-04-04 DIAGNOSIS — T82858A Stenosis of vascular prosthetic devices, implants and grafts, initial encounter: Secondary | ICD-10-CM | POA: Insufficient documentation

## 2018-04-04 DIAGNOSIS — I6529 Occlusion and stenosis of unspecified carotid artery: Secondary | ICD-10-CM | POA: Insufficient documentation

## 2018-04-04 DIAGNOSIS — Z9889 Other specified postprocedural states: Secondary | ICD-10-CM | POA: Insufficient documentation

## 2018-04-04 DIAGNOSIS — I25110 Atherosclerotic heart disease of native coronary artery with unstable angina pectoris: Secondary | ICD-10-CM

## 2018-04-04 DIAGNOSIS — J441 Chronic obstructive pulmonary disease with (acute) exacerbation: Secondary | ICD-10-CM | POA: Insufficient documentation

## 2018-04-04 DIAGNOSIS — I2510 Atherosclerotic heart disease of native coronary artery without angina pectoris: Secondary | ICD-10-CM

## 2018-04-04 DIAGNOSIS — Z79899 Other long term (current) drug therapy: Secondary | ICD-10-CM | POA: Insufficient documentation

## 2018-04-04 DIAGNOSIS — I252 Old myocardial infarction: Secondary | ICD-10-CM | POA: Diagnosis not present

## 2018-04-04 HISTORY — PX: RIGHT/LEFT HEART CATH AND CORONARY/GRAFT ANGIOGRAPHY: CATH118267

## 2018-04-04 LAB — POCT I-STAT 3, VENOUS BLOOD GAS (G3P V)
ACID-BASE EXCESS: 1 mmol/L (ref 0.0–2.0)
BICARBONATE: 26.8 mmol/L (ref 20.0–28.0)
O2 Saturation: 72 %
PH VEN: 7.361 (ref 7.250–7.430)
TCO2: 28 mmol/L (ref 22–32)
pCO2, Ven: 47.4 mmHg (ref 44.0–60.0)
pO2, Ven: 40 mmHg (ref 32.0–45.0)

## 2018-04-04 LAB — POCT I-STAT 3, ART BLOOD GAS (G3+)
BICARBONATE: 25 mmol/L (ref 20.0–28.0)
O2 SAT: 98 %
PH ART: 7.395 (ref 7.350–7.450)
TCO2: 26 mmol/L (ref 22–32)
pCO2 arterial: 40.9 mmHg (ref 32.0–48.0)
pO2, Arterial: 100 mmHg (ref 83.0–108.0)

## 2018-04-04 SURGERY — RIGHT/LEFT HEART CATH AND CORONARY/GRAFT ANGIOGRAPHY
Anesthesia: LOCAL

## 2018-04-04 MED ORDER — FENTANYL CITRATE (PF) 100 MCG/2ML IJ SOLN
INTRAMUSCULAR | Status: AC
  Filled 2018-04-04: qty 2

## 2018-04-04 MED ORDER — MIDAZOLAM HCL 2 MG/2ML IJ SOLN
INTRAMUSCULAR | Status: DC | PRN
  Administered 2018-04-04: 2 mg via INTRAVENOUS

## 2018-04-04 MED ORDER — SODIUM CHLORIDE 0.9 % WEIGHT BASED INFUSION: 1.0000 mL/kg/h | INTRAVENOUS | Status: AC

## 2018-04-04 MED ORDER — SODIUM CHLORIDE 0.9% FLUSH: 3.0000 mL | INTRAVENOUS | Status: DC | PRN

## 2018-04-04 MED ORDER — HEPARIN (PORCINE) IN NACL 2-0.9 UNITS/ML
INTRAMUSCULAR | Status: AC | PRN
  Administered 2018-04-04: 1000 mL

## 2018-04-04 MED ORDER — VERAPAMIL HCL 2.5 MG/ML IV SOLN
INTRAVENOUS | Status: AC
  Filled 2018-04-04: qty 2

## 2018-04-04 MED ORDER — SODIUM CHLORIDE 0.9% FLUSH: 3.0000 mL | Freq: Two times a day (BID) | INTRAVENOUS | Status: DC

## 2018-04-04 MED ORDER — LIDOCAINE HCL (PF) 1 % IJ SOLN
INTRAMUSCULAR | Status: AC
  Filled 2018-04-04: qty 30

## 2018-04-04 MED ORDER — SODIUM CHLORIDE 0.9 % IV SOLN: 250.0000 mL | INTRAVENOUS | Status: DC | PRN

## 2018-04-04 MED ORDER — ONDANSETRON HCL 4 MG/2ML IJ SOLN: 4.0000 mg | Freq: Four times a day (QID) | INTRAMUSCULAR | Status: DC | PRN

## 2018-04-04 MED ORDER — HEPARIN (PORCINE) IN NACL 1000-0.9 UT/500ML-% IV SOLN
INTRAVENOUS | Status: AC
  Filled 2018-04-04: qty 1000

## 2018-04-04 MED ORDER — MIDAZOLAM HCL 2 MG/2ML IJ SOLN
INTRAMUSCULAR | Status: AC
  Filled 2018-04-04: qty 2

## 2018-04-04 MED ORDER — ASPIRIN 81 MG PO CHEW: 81.0000 mg | CHEWABLE_TABLET | ORAL | Status: DC

## 2018-04-04 MED ORDER — ASPIRIN 81 MG PO CHEW
CHEWABLE_TABLET | ORAL | Status: AC
  Administered 2018-04-04: 81 mg
  Filled 2018-04-04: qty 1

## 2018-04-04 MED ORDER — ACETAMINOPHEN 325 MG PO TABS: 650.0000 mg | ORAL_TABLET | ORAL | Status: DC | PRN

## 2018-04-04 MED ORDER — FENTANYL CITRATE (PF) 100 MCG/2ML IJ SOLN
INTRAMUSCULAR | Status: DC | PRN
  Administered 2018-04-04: 25 ug via INTRAVENOUS

## 2018-04-04 MED ORDER — SODIUM CHLORIDE 0.9 % IV SOLN
INTRAVENOUS | Status: DC
  Administered 2018-04-04: 09:00:00 via INTRAVENOUS

## 2018-04-04 MED ORDER — IOHEXOL 350 MG/ML SOLN
INTRAVENOUS | Status: DC | PRN
  Administered 2018-04-04: 55 mL via INTRA_ARTERIAL

## 2018-04-04 MED ORDER — LIDOCAINE HCL (PF) 1 % IJ SOLN
INTRAMUSCULAR | Status: DC | PRN
  Administered 2018-04-04: 15 mL

## 2018-04-04 SURGICAL SUPPLY — 12 items
CATH INFINITI 5FR MULTPACK ANG (CATHETERS) ×1 IMPLANT
CATH SWAN GANZ 7F STRAIGHT (CATHETERS) ×1 IMPLANT
COVER PRB 48X5XTLSCP FOLD TPE (BAG) IMPLANT
COVER PROBE 5X48 (BAG) ×2
KIT HEART LEFT (KITS) ×2 IMPLANT
PACK CARDIAC CATHETERIZATION (CUSTOM PROCEDURE TRAY) ×2 IMPLANT
SHEATH PINNACLE 5F 10CM (SHEATH) ×1 IMPLANT
SHEATH PINNACLE 7F 10CM (SHEATH) ×1 IMPLANT
TRANSDUCER W/STOPCOCK (MISCELLANEOUS) ×2 IMPLANT
TUBING CIL FLEX 10 FLL-RA (TUBING) ×2 IMPLANT
WIRE EMERALD 3MM-J .035X150CM (WIRE) ×1 IMPLANT
WIRE EMERALD ST .035X150CM (WIRE) ×1 IMPLANT

## 2018-04-04 NOTE — Interval H&P Note (Signed)
History and Physical Interval Note:  04/04/2018 10:16 AM  Steve Durham  has presented today for surgery, with the diagnosis of ua  The various methods of treatment have been discussed with the patient and family. After consideration of risks, benefits and other options for treatment, the patient has consented to  Procedure(s): RIGHT/LEFT HEART CATH AND CORONARY/GRAFT ANGIOGRAPHY (N/A) as a surgical intervention .  The patient's history has been reviewed, patient examined, no change in status, stable for surgery.  I have reviewed the patient's chart and labs.  Questions were answered to the patient's satisfaction.   Cath Lab Visit (complete for each Cath Lab visit)  Clinical Evaluation Leading to the Procedure:   ACS: No.  Non-ACS:    Anginal Classification: CCS IV  Anti-ischemic medical therapy: Maximal Therapy (2 or more classes of medications)  Non-Invasive Test Results: No non-invasive testing performed  Prior CABG: Previous CABG        Collier [facility] 04/04/2018 10:16 AM

## 2018-04-04 NOTE — Discharge Instructions (Signed)
**Note Steve Durham-identified via Obfuscation** Femoral Site Care °Refer to this sheet in the next few weeks. These instructions provide you with information about caring for yourself after your procedure. Your health care provider may also give you more specific instructions. Your treatment has been planned according to current medical practices, but problems sometimes occur. Call your health care provider if you have any problems or questions after your procedure. °What can I expect after the procedure? °After your procedure, it is typical to have the following: °· Bruising at the site that usually fades within 1-2 weeks. °· Blood collecting in the tissue (hematoma) that may be painful to the touch. It should usually decrease in size and tenderness within 1-2 weeks. ° °Follow these instructions at home: °· Take medicines only as directed by your health care provider. °· You may shower 24-48 hours after the procedure or as directed by your health care provider. Remove the bandage (dressing) and gently wash the site with plain soap and water. Pat the area dry with a clean towel. Do not rub the site, because this may cause bleeding. °· Do not take baths, swim, or use a hot tub until your health care provider approves. °· Check your insertion site every day for redness, swelling, or drainage. °· Do not apply powder or lotion to the site. °· Limit use of stairs to twice a day for the first 2-3 days or as directed by your health care provider. °· Do not squat for the first 2-3 days or as directed by your health care provider. °· Do not lift over 10 lb (4.5 kg) for 5 days after your procedure or as directed by your health care provider. °· Ask your health care provider when it is okay to: °? Return to work or school. °? Resume usual physical activities or sports. °? Resume sexual activity. °· Do not drive home if you are discharged the same day as the procedure. Have someone else drive you. °· You may drive 24 hours after the procedure unless otherwise instructed by  your health care provider. °· Do not operate machinery or power tools for 24 hours after the procedure or as directed by your health care provider. °· If your procedure was done as an outpatient procedure, which means that you went home the same day as your procedure, a responsible adult should be with you for the first 24 hours after you arrive home. °· Keep all follow-up visits as directed by your health care provider. This is important. °Contact a health care provider if: °· You have a fever. °· You have chills. °· You have increased bleeding from the site. Hold pressure on the site. °Get help right away if: °· You have unusual pain at the site. °· You have redness, warmth, or swelling at the site. °· You have drainage (other than a small amount of blood on the dressing) from the site. °· The site is bleeding, and the bleeding does not stop after 30 minutes of holding steady pressure on the site. °· Your leg or foot becomes pale, cool, tingly, or numb. °This information is not intended to replace advice given to you by your health care provider. Make sure you discuss any questions you have with your health care provider. °Document Released: 06/07/2014 Document Revised: 03/11/2016 Document Reviewed: 04/23/2014 °Elsevier Interactive Patient Education © 2018 [employer] ° °

## 2018-04-04 NOTE — Progress Notes (Signed)
Site area: Right groin a 5 french arterial sheath was removed  Site Prior to Removal:  Level 0  Pressure Applied For 20 MINUTES    Bedrest Beginning at   1135am  Manual:   Yes.    Patient Status During Pull:  stable  Post Pull Groin Site:  Level 0  Post Pull Instructions Given:  Yes.    Post Pull Pulses Present:  Yes.    Dressing Applied:  Yes.    Comments:  VS remain stable 

## 2018-04-05 ENCOUNTER — Encounter (HOSPITAL_COMMUNITY): Payer: Self-pay | Admitting: Cardiology

## 2018-04-05 MED FILL — Verapamil HCl IV Soln 2.5 MG/ML: INTRAVENOUS | Qty: 2 | Status: AC

## 2018-04-05 MED FILL — Heparin Sod (Porcine)-NaCl IV Soln 1000 Unit/500ML-0.9%: INTRAVENOUS | Qty: 1000 | Status: AC

## 2018-04-18 ENCOUNTER — Other Ambulatory Visit: Payer: Self-pay | Admitting: Cardiovascular Disease

## 2018-04-25 ENCOUNTER — Other Ambulatory Visit (HOSPITAL_COMMUNITY): Payer: Medicare Other

## 2018-04-25 NOTE — Progress Notes (Signed)
Cardiology Office Note    Date:  04/26/2018   ID:  Steve Durham, DOB 23-Mar-1946, MRN [id number]  PCP:  Townsend Roger, MD  Cardiologist: Lauree Chandler, MD  Chief Complaint  Patient presents with  . Follow-up    History of Present Illness:  Steve Durham is a 72 y.o. male with history of coronary artery disease status post CABG in 1993, hypertension, HLD.  Repeat cath 2013 with occlusion of the LAD, patent LIMA to the LAD, occluded proximal RCA and occluded SVG to the PDA, severe disease in left main into circumflex and occluded vein graft to the circumflex.  Redo CABG suggested but his aorta was severely calcified.  He was seen by Dr. Pia Mau not felt to be a candidate for redo bypass.  Imdur was added.  NSTEMI 07/2013 treated with DES to the mid LAD extending back into the LIMA graft.  Inferior lateral STEMI 02/2014 found to have progression of disease on the left main/proximal circumflex stent.  DES was placed from the circumflex back into the left main.  Unstable angina 07/2014 DES to OM1.  Left main stenosis was treated with balloon angioplasty.  Repeat cath 01/2015 and 03/2015 04/2016 stable disease.  He continues to smoke.  Could not afford Ranexa.  Complaining of worsening dyspnea on exertion and repeat cardiac catheterization 04/04/2018 showed severe three-vessel CAD with continued patency of stent in the LIMA to the LAD, continued patent stent in the left main/circumflex/OM.  There was a 50% focal stenosis in the stented segment, occluded SVG to the RCA, occluded SVG to OM, patent LIMA to the LAD, mild aortic stenosis with minimal gradient by cath, normal right heart pressures.  No significant change since 11/25/2015 suspect shortness of breath is related to COPD.  Patient comes in today for follow-up.  He has chronic dyspnea on exertion and is unwilling to quit smoking.  He denies any further chest pain.    Past Medical History:  Diagnosis Date  . Abnormal CT scan, kidney    07/2012 - multiple small cysts  . Aortic stenosis    a. Mod AS/AI by cath 07/2012.;  b.  Echo (07/31/13) is: EF 55-60%, mild aortic stenosis (mean gradient 13);  c. Echo (5/15):  EF 40-45%, mild AS (mean 12 mmHg), mod AI  . Back pain   . Carotid artery occlusion    a. Dopplers 07/2012: no significant high grade obstruction.  . Cholelithiasis    Seen on CT 07/2012  . Coronary artery disease    a. CABG 10/1991. b. cath 07/2012 with prog dsz, turned down for re-do CABG - for med rx for now.; c. NSTEMI/PCI: DES to mLAD ext into IMA graft;  d. Inf STEMI (5/15):  LM 60-70% then 99% before LAD, pLAD occl, pCFX stent 80+% ISR, oRCA 99% then occl (L-R collats to dRCA), L-LAD ok with patent stent, S-CFX occl (old), S-RCA occl (old); PCI: LM ext into CFX with Xience Alpine DES  . Heart failure (HCC) systolic  . Hyperlipidemia   . Hyperlipoproteinemia   . Hypertension   . Ischemic cardiomyopathy    a. 2D ECHO: 08/02/2014; EF 45%; severe hypokinesis base/mid-inferolat segments and severe hypokinesis base inferior segment. Mild LVH, mild AS (may be underestimated due to LV dysfunction).  Mean gradient (S): 14 mm Hg. Peak gradient (S): 25 mm Hg. VTI ratio of LVOT to aortic valve: 0.37. Mod LA dilation. Mild RV systolic dysfxn     Past Surgical History:  Procedure Laterality Date  .  CARDIAC CATHETERIZATION  04/03/99  . CARDIAC CATHETERIZATION N/A 03/20/2015   Procedure: Left Heart Cath and Cors/Grafts Angiography;  Surgeon: Jettie Booze, MD;  Location: [place] [facility];  Service: Cardiovascular;  Laterality: N/A;  . CARDIAC CATHETERIZATION N/A 04/28/2016   Procedure: Right/Left Heart Cath and Coronary/Graft Angiography;  Surgeon: Burnell Blanks, MD;  Location: [place] [facility];  Service: Cardiovascular;  Laterality: N/A;  . CARPAL TUNNEL RELEASE  2007   Excision mass dorsal left wrist  . CORONARY ARTERY BYPASS GRAFT  1993  . FASCIOTOMY Right 07/30/2013   Procedure: OPEN FASCIOTOMY RIGHT  RING FINGER & RIGHT SMALL FINGER MULTIPLE LEVELS;  Surgeon: Wynonia Sours, MD;  Location: [facility];  Service: Orthopedics;  Laterality: Right;  . HERNIA REPAIR  1988  . LEFT HEART CATHETERIZATION WITH CORONARY ANGIOGRAM N/A 02/28/2014   Procedure: LEFT HEART CATHETERIZATION WITH CORONARY ANGIOGRAM;  Surgeon: Troy Sine, MD;  Location: [facility] CATH LAB;  Service: Cardiovascular;  Laterality: N/A;  . LEFT HEART CATHETERIZATION WITH CORONARY/GRAFT ANGIOGRAM N/A 08/10/2012   Procedure: LEFT HEART CATHETERIZATION WITH Beatrix Fetters;  Surgeon: Hillary Bow, MD;  Location: [facility] CATH LAB;  Service: Cardiovascular;  Laterality: N/A;  . LEFT HEART CATHETERIZATION WITH CORONARY/GRAFT ANGIOGRAM N/A 08/01/2014   Procedure: LEFT HEART CATHETERIZATION WITH Beatrix Fetters;  Surgeon: Leonie Man, MD;  Location: [facility] CATH LAB;  Service: Cardiovascular;  Laterality: N/A;  . LEFT HEART CATHETERIZATION WITH CORONARY/GRAFT ANGIOGRAM N/A 02/12/2015   Procedure: LEFT HEART CATHETERIZATION WITH Beatrix Fetters;  Surgeon: Burnell Blanks, MD;  Location: [facility] CATH LAB;  Service: Cardiovascular;  Laterality: N/A;  . PERCUTANEOUS CORONARY STENT INTERVENTION (PCI-S)  07/31/2013   Procedure: PERCUTANEOUS CORONARY STENT INTERVENTION (PCI-S);  Surgeon: Burnell Blanks, MD;  Location: [facility] CATH LAB;  Service: Cardiovascular;;  LIMA to LAD at the anastomosis with aortic root shot   . RIGHT/LEFT HEART CATH AND CORONARY/GRAFT ANGIOGRAPHY N/A 04/04/2018   Procedure: RIGHT/LEFT HEART CATH AND CORONARY/GRAFT ANGIOGRAPHY;  Surgeon: Martinique, Peter M, MD;  Location: [place] [facility];  Service: Cardiovascular;  Laterality: N/A;    Current Medications: Current Meds  Medication Sig  . allopurinol (ZYLOPRIM) 100 MG tablet Take 100 mg by mouth daily.  Marland Kitchen amLODipine (NORVASC) 5 MG tablet Take 1 tablet (5 mg total) by mouth daily.  Marland Kitchen aspirin EC 81 MG tablet Take 81 mg by mouth at bedtime.    Marland Kitchen atorvastatin (LIPITOR) 80 MG tablet TAKE ONE (1) TABLET EACH DAY  . bisoprolol (ZEBETA) 5 MG tablet Take 0.5 tablets (2.5 mg total) by mouth 2 (two) times daily.  . clopidogrel (PLAVIX) 75 MG tablet Take 1 tablet (75 mg total) by mouth daily.  . fexofenadine (ALLEGRA) 180 MG tablet Take 180 mg by mouth daily as needed for allergies.   . furosemide (LASIX) 40 MG tablet TAKE ONE TABLET BY MOUTH TWICE DAILY  . ipratropium-albuterol (DUONEB) 0.5-2.5 (3) MG/3ML SOLN Take 3 mLs by nebulization every 6 (six) hours as needed (for shortness of breath or wheezing).   . isosorbide mononitrate (IMDUR) 60 MG 24 hr tablet Take 1 tablet (60 mg total) by mouth 2 (two) times daily.  . nitroGLYCERIN (NITROSTAT) 0.4 MG SL tablet TAKE 1 TABLET UNDER THE TONGUE AS NEEDEDFOR CHEST PAIN ( MAY REPEAT EVERY 5 MINUTES X 3) (Patient taking differently: Place 0.4 mg under the tongue every 5 (five) minutes as needed for chest pain. )  . pantoprazole (PROTONIX) 40 MG tablet TAKE ONE TABLET BY MOUTH  ONCE DAILY  . potassium chloride SA (K-DUR,KLOR-CON) 20 MEQ tablet TAKE 1 TABLET BY MOUTH ONCE DAILY  . PROAIR HFA 108 (90 Base) MCG/ACT inhaler Take 2 puffs by mouth every 4 (four) hours as needed for wheezing or shortness of breath.   . spironolactone (ALDACTONE) 25 MG tablet TAKE 1/2 TABLET BY MOUTH TWICE DAILY     Allergies:   Patient has no known allergies.   Social History   Socioeconomic History  . Marital status: Married    Spouse name: Not on file  . Number of children: 1  . Years of education: Not on file  . Highest education level: Not on file  Occupational History  . Occupation: Plumbing    Comment: Retired in 2007  Social Needs  . Financial resource strain: Not on file  . Food insecurity:    Worry: Not on file    Inability: Not on file  . Transportation needs:    Medical: Not on file    Non-medical: Not on file  Tobacco Use  . Smoking status: Current Every Day Smoker    Packs/day: 1.00    Years:  57.00    Pack years: 57.00    Types: Cigarettes  . Smokeless tobacco: Never Used  Substance and Sexual Activity  . Alcohol use: Yes    Alcohol/week: 3.0 oz    Types: 5 Cans of beer per week    Comment: daily-6 daily  . Drug use: No  . Sexual activity: Not on file  Lifestyle  . Physical activity:    Days per week: Not on file    Minutes per session: Not on file  . Stress: Not on file  Relationships  . Social connections:    Talks on phone: Not on file    Gets together: Not on file    Attends religious service: Not on file    Active member of club or organization: Not on file    Attends meetings of clubs or organizations: Not on file    Relationship status: Not on file  Other Topics Concern  . Not on file  Social History Narrative   Lives in [place] with his family.  Does not routinely exercise.     Family History:  The patient's family history includes Allergies in his sister; Emphysema in his brother; Heart attack in his mother; Hypertension in his brother, father, mother, and sister.   ROS:   Please see the history of present illness.    Review of Systems  Constitution: Negative.  HENT: Negative.   Cardiovascular: Positive for dyspnea on exertion.  Respiratory: Positive for wheezing.   Endocrine: Negative.   Hematologic/Lymphatic: Negative.   Musculoskeletal: Negative.   Gastrointestinal: Negative.   Genitourinary: Negative.   Neurological: Negative.    All other systems reviewed and are negative.   PHYSICAL EXAM:   VS:  BP 130/66   Pulse 60   Ht 5\' 5"  (1.651 m)   Wt 146 lb (66.2 kg)   SpO2 97%   BMI 24.30 kg/m   Physical Exam  GEN: Well nourished, well developed, in no acute distress  Neck: no JVD, carotid bruits, or masses Cardiac:RRR; 2/6 to 3/6 systolic murmur at the left sternal border Respiratory:  clear to auscultation bilaterally, normal work of breathing GI: soft, nontender, nondistended, + BS Ext: Right groin at cath site without hematoma or  hemorrhage, otherwise lower extremities without cyanosis, clubbing, or edema, decreased distal pulses bilaterally Neuro:  Alert and Oriented x 3 Psych: euthymic  mood, full affect  Wt Readings from Last 3 Encounters:  04/26/18 146 lb (66.2 kg)  04/04/18 147 lb (66.7 kg)  03/27/18 146 lb 3.2 oz (66.3 kg)      Studies/Labs Reviewed:   EKG:  EKG is not ordered today.   Recent Labs: 03/27/2018: BUN 18; Creatinine, Ser 1.17; Hemoglobin 14.6; Platelets 354; Potassium 4.4; Sodium 141   Lipid Panel    Component Value Date/Time   CHOL 137 08/18/2015 1056   CHOL 146 03/31/2015 0905   TRIG 128 08/18/2015 1056   HDL 42 08/18/2015 1056   HDL 42 03/31/2015 0905   CHOLHDL 3.3 08/18/2015 1056   VLDL 26 08/18/2015 1056   LDLCALC 69 08/18/2015 1056   LDLCALC 77 03/31/2015 0905    Additional studies/ records that were reviewed today include:  Cardiac catheterization 6/18/2019Conclusion      Non-stenotic Ost LM to LM lesion previously treated.  Ost LAD to Prox LAD lesion is 100% stenosed.  Ost Cx to Prox Cx lesion is 50% stenosed.  Ost RCA to Prox RCA lesion is 100% stenosed.  Previously placed Dist LAD stent (unknown type) is widely patent.  Previously placed Ost 1st Mrg stent (unknown type) is widely patent.  LV end diastolic pressure is normal.   1. Severe 3 vessel occlusive CAD. Continued patency of stent in the IMA/LAD. Continued patency of stents in the left main/LCx/OM. There is a focal 50% stenosis in the stented segment.  2. Occluded SVG to the RCA 3. Occluded SVG to the OM. 4. Patent LIMA to the LAD 5. Normal LV filling pressures 6. Mild Aortic stenosis with minimal gradient by cath 7. Normal right heart pressures.  8. Normal cardiac output   Plan: Compared to 2017 there is no significant change. I suspect his increased shortness of breath is more related to COPD.       2D echo 6/7/2019Study Conclusions   - Left ventricle: The cavity size was normal. Wall  thickness was   increased in a pattern of mild LVH. Systolic function was normal.   The estimated ejection fraction was in the range of 50% to 55%.   Inferoseptal hypokinesis. The study is not technically sufficient   to allow evaluation of LV diastolic function. - Aortic valve: Moderate stenosis and moderate regurgitation. Mean   gradient (S): 20 mm Hg. Peak gradient (S): 43 mm Hg. - Mitral valve: Calcified annulus. Mildly thickened leaflets .   There was mild regurgitation. - Left atrium: Moderately dilated. - Right ventricle: The cavity size was normal. Systolic function   was low normal. - Inferior vena cava: The vessel was normal in size. The   respirophasic diameter changes were in the normal range (>= 50%),   consistent with normal central venous pressure.   Impressions:   - LVEF 50-55%, mild LVH, inferoseptal hypokinesis, moderate aortic   stenosis with moderate AI and mean gradient of 20 mmHg, mild MR,   moderate LAE, noraml IvC.      ASSESSMENT:    1. Coronary artery disease involving native coronary artery of native heart without angina pectoris   2. Essential hypertension   3. Ischemic cardiomyopathy with EF 40-45%   4. Pure hypercholesterolemia   5. Cigarette smoker   6. Nonrheumatic aortic valve stenosis      PLAN:  In order of problems listed above:  CAD status post CABG 1993 with multiple stents as discussed above.  Most recent cath 04/04/2018 no significant change since 2017 continue medical therapy.  No  further chest pain.  Smoking cessation essential.  Continue aspirin, Plavix, Lipitor, Imdur  Essential hypertension blood pressure well controlled on amlodipine and Aldactone  Ischemic cardiomyopathy ejection fraction improved on echo 03/2018 LVEF 50 to 55%  Hypercholesterolemia on statin  Tobacco abuse patient refuses to quit smoking  Aortic valve disease moderate  aortic stenosis by echo 03/2018 mild on cardiac catheterization.    Medication  Adjustments/Labs and Tests Ordered: Current medicines are reviewed at length with the patient today.  Concerns regarding medicines are outlined above.  Medication changes, Labs and Tests ordered today are listed in the Patient Instructions below. Patient Instructions  Medication Instructions:  Your physician recommends that you continue on your current medications as directed. Please refer to the Current Medication list given to you today.   Labwork: None ordered  Testing/Procedures: None ordered  Follow-Up: Keep appointment with Dr. Angelena Form on 05-26-18 at 2:40 pm. Please arrive 15 minutes prior to your appointment.    Any Other Special Instructions Will Be Listed Below (If Applicable).     If you need a refill on your cardiac medications before your next appointment, please call your pharmacy.      Sumner Boast, PA-C  04/26/2018 8:43 AM    [facility] [place], [place],  [place]  [address] Phone: [phone]; Fax: [phone]

## 2018-04-26 ENCOUNTER — Ambulatory Visit (INDEPENDENT_AMBULATORY_CARE_PROVIDER_SITE_OTHER): Payer: Medicare Other | Admitting: Physician Assistant

## 2018-04-26 ENCOUNTER — Encounter: Payer: Self-pay | Admitting: Physician Assistant

## 2018-04-26 VITALS — BP 130/66 | HR 60 | Ht 65.0 in | Wt 146.0 lb

## 2018-04-26 DIAGNOSIS — F17210 Nicotine dependence, cigarettes, uncomplicated: Secondary | ICD-10-CM

## 2018-04-26 DIAGNOSIS — I2510 Atherosclerotic heart disease of native coronary artery without angina pectoris: Secondary | ICD-10-CM

## 2018-04-26 DIAGNOSIS — I255 Ischemic cardiomyopathy: Secondary | ICD-10-CM

## 2018-04-26 DIAGNOSIS — I350 Nonrheumatic aortic (valve) stenosis: Secondary | ICD-10-CM | POA: Diagnosis not present

## 2018-04-26 DIAGNOSIS — I10 Essential (primary) hypertension: Secondary | ICD-10-CM

## 2018-04-26 DIAGNOSIS — E7800 Pure hypercholesterolemia, unspecified: Secondary | ICD-10-CM | POA: Diagnosis not present

## 2018-04-26 NOTE — Patient Instructions (Signed)
Medication Instructions:  Your physician recommends that you continue on your current medications as directed. Please refer to the Current Medication list given to you today.   Labwork: None ordered  Testing/Procedures: None ordered  Follow-Up: Keep appointment with Dr. Angelena Form on 05-26-18 at 2:40 pm. Please arrive 15 minutes prior to your appointment.    Any Other Special Instructions Will Be Listed Below (If Applicable).     If you need a refill on your cardiac medications before your next appointment, please call your pharmacy.

## 2018-05-05 ENCOUNTER — Other Ambulatory Visit: Payer: Self-pay | Admitting: Cardiovascular Disease

## 2018-05-12 ENCOUNTER — Other Ambulatory Visit: Payer: Self-pay | Admitting: Cardiovascular Disease

## 2018-05-15 ENCOUNTER — Other Ambulatory Visit: Payer: Self-pay | Admitting: Cardiovascular Disease

## 2018-05-26 ENCOUNTER — Encounter

## 2018-05-26 ENCOUNTER — Encounter: Payer: Self-pay | Admitting: Cardiovascular Disease

## 2018-05-26 ENCOUNTER — Ambulatory Visit (INDEPENDENT_AMBULATORY_CARE_PROVIDER_SITE_OTHER): Payer: Medicare Other | Admitting: Cardiovascular Disease

## 2018-05-26 ENCOUNTER — Encounter (INDEPENDENT_AMBULATORY_CARE_PROVIDER_SITE_OTHER): Payer: Self-pay

## 2018-05-26 VITALS — BP 128/70 | HR 64 | Ht 65.0 in | Wt 148.2 lb

## 2018-05-26 DIAGNOSIS — I10 Essential (primary) hypertension: Secondary | ICD-10-CM

## 2018-05-26 DIAGNOSIS — E7800 Pure hypercholesterolemia, unspecified: Secondary | ICD-10-CM | POA: Diagnosis not present

## 2018-05-26 DIAGNOSIS — I255 Ischemic cardiomyopathy: Secondary | ICD-10-CM | POA: Diagnosis not present

## 2018-05-26 DIAGNOSIS — Z720 Tobacco use: Secondary | ICD-10-CM

## 2018-05-26 DIAGNOSIS — I25118 Atherosclerotic heart disease of native coronary artery with other forms of angina pectoris: Secondary | ICD-10-CM | POA: Diagnosis not present

## 2018-05-26 DIAGNOSIS — I5032 Chronic diastolic (congestive) heart failure: Secondary | ICD-10-CM

## 2018-05-26 DIAGNOSIS — R0602 Shortness of breath: Secondary | ICD-10-CM

## 2018-05-26 NOTE — Patient Instructions (Signed)
Medication Instructions:  Your physician has recommended you make the following change in your medication:  Stop aspirin   Labwork: none  Testing/Procedures: none  Follow-Up: You have been referred to Dr. Lake Bells at [facility] pulmonary.  Your physician recommends that you schedule a follow-up appointment in: 6 months. Please call our office in about 2 months to schedule this appointment    Any Other Special Instructions Will Be Listed Below (If Applicable).     If you need a refill on your cardiac medications before your next appointment, please call your pharmacy.

## 2018-05-26 NOTE — Progress Notes (Signed)
Chief Complaint  Patient presents with  . Follow-up    CAD   History of Present Illness: 72 yo male with history of CAD s/p CABG in 1993, HTN, HLD, aortic stenosis and ongoing tobacco abuse here today for follow up. He has severe CAD. He was admitted October 2013 and cath performed with occlusion of LAD, patent IMA to LAD, occluded proximal RCA with occluded SVG to PDA, severe disease in left main into Circumflex with occluded vein graft to Circumflex. Re-do CABG suggested but his aorta was severely calcified. He was seen by Dr. Servando Snare with CT surgery and not felt to be a candidate for re-do bypass. Imdur was added. No intervention performed. He was admitted October 2014 with a NSTEMI. Cardiac cath with 70% left main stenosis, LAD occluded, mid LAD 99 after anastomosis of the graft, prox Circumflex 50% , OM stent with 70% ISR, pRCA occluded, SVG-PDA occluded, SVG-OM occluded, LIMA-LAD patent. A drug eluting stent was placed in the mid LAD extending back into the IMA graft. He was readmitted May 2015 with an inferolateral STEMI and found to have progression of disease in the left main/proximal Circumflex stent. A drug eluting stent was placed from the Circumflex back into the left main. He was readmitted October 2015 with unstable angina. Cardiac cath with stenosis left main stent and OM 1. A drug eluting stent was placed in the first OM branch. The left main stent restenosis was treated with balloon angioplasty. Repeat caths in April 2016 and June 2016 with stable disease. Despite all of his coronary interventions, he continues to smoke. He has been tried on Ranexa but stopped due to cost. His most recent cardiac cath was in July 2017 and showed stable CAD. Echo July 2018 with LVEF=45-50%, moderate AS, mild AI. I saw him in the office June 2019 and c/o dyspnea. He was seen in primary care on 03/17/18 for workup of dyspnea. Chest x-ray 03/17/18 with evidence of COPD but no acute changes. He was treated for  a COPD exacerbation and noted mild improvement in dyspnea. He denied chest pain. I arranged a right and left heart cath at [facility] 04/04/18. This showed severe 3V disease with patency of the stent in the IMA/LAD, stents left main, Circumflex and OM. The SVG to the RCA and SVG to the OM known to be occluded. Right heart pressures were normal. There was a mild gradient acros the aortic valve. Echo June 2019 with normal LV systolic function, ZOXW=96-04%. Moderate AS with mean gradient 20 mmHg.   He is here today for follow up. The patient denies any chest pain, palpitations, lower extremity edema, orthopnea, PND, dizziness, near syncope or syncope. He continues to have dyspnea at rest and with exertion. He is still smoking.    Primary Care Physician: Townsend Roger, MD   Past Medical History:  Diagnosis Date  . Abnormal CT scan, kidney    07/2012 - multiple small cysts  . Aortic stenosis    a. Mod AS/AI by cath 07/2012.;  b.  Echo (07/31/13) is: EF 55-60%, mild aortic stenosis (mean gradient 13);  c. Echo (5/15):  EF 40-45%, mild AS (mean 12 mmHg), mod AI  . Back pain   . Carotid artery occlusion    a. Dopplers 07/2012: no significant high grade obstruction.  . Cholelithiasis    Seen on CT 07/2012  . Coronary artery disease    a. CABG 10/1991. b. cath 07/2012 with prog dsz, turned down for re-do CABG -  for med rx for now.; c. NSTEMI/PCI: DES to mLAD ext into IMA graft;  d. Inf STEMI (5/15):  LM 60-70% then 99% before LAD, pLAD occl, pCFX stent 80+% ISR, oRCA 99% then occl (L-R collats to dRCA), L-LAD ok with patent stent, S-CFX occl (old), S-RCA occl (old); PCI: LM ext into CFX with Xience Alpine DES  . Heart failure (HCC) systolic  . Hyperlipidemia   . Hyperlipoproteinemia   . Hypertension   . Ischemic cardiomyopathy    a. 2D ECHO: 08/02/2014; EF 45%; severe hypokinesis base/mid-inferolat segments and severe hypokinesis base inferior segment. Mild LVH, mild AS (may be underestimated due to LV  dysfunction).  Mean gradient (S): 14 mm Hg. Peak gradient (S): 25 mm Hg. VTI ratio of LVOT to aortic valve: 0.37. Mod LA dilation. Mild RV systolic dysfxn     Past Surgical History:  Procedure Laterality Date  . CARDIAC CATHETERIZATION  04/03/99  . CARDIAC CATHETERIZATION N/A 03/20/2015   Procedure: Left Heart Cath and Cors/Grafts Angiography;  Surgeon: Jettie Booze, MD;  Location: [place] [facility];  Service: Cardiovascular;  Laterality: N/A;  . CARDIAC CATHETERIZATION N/A 04/28/2016   Procedure: Right/Left Heart Cath and Coronary/Graft Angiography;  Surgeon: Burnell Blanks, MD;  Location: [place] [facility];  Service: Cardiovascular;  Laterality: N/A;  . CARPAL TUNNEL RELEASE  2007   Excision mass dorsal left wrist  . CORONARY ARTERY BYPASS GRAFT  1993  . FASCIOTOMY Right 07/30/2013   Procedure: OPEN FASCIOTOMY RIGHT RING FINGER & RIGHT SMALL FINGER MULTIPLE LEVELS;  Surgeon: Wynonia Sours, MD;  Location: [place];  Service: Orthopedics;  Laterality: Right;  . HERNIA REPAIR  1988  . LEFT HEART CATHETERIZATION WITH CORONARY ANGIOGRAM N/A 02/28/2014   Procedure: LEFT HEART CATHETERIZATION WITH CORONARY ANGIOGRAM;  Surgeon: Troy Sine, MD;  Location: [facility] CATH LAB;  Service: Cardiovascular;  Laterality: N/A;  . LEFT HEART CATHETERIZATION WITH CORONARY/GRAFT ANGIOGRAM N/A 08/10/2012   Procedure: LEFT HEART CATHETERIZATION WITH Beatrix Fetters;  Surgeon: Hillary Bow, MD;  Location: [facility] CATH LAB;  Service: Cardiovascular;  Laterality: N/A;  . LEFT HEART CATHETERIZATION WITH CORONARY/GRAFT ANGIOGRAM N/A 08/01/2014   Procedure: LEFT HEART CATHETERIZATION WITH Beatrix Fetters;  Surgeon: Leonie Man, MD;  Location: [facility] CATH LAB;  Service: Cardiovascular;  Laterality: N/A;  . LEFT HEART CATHETERIZATION WITH CORONARY/GRAFT ANGIOGRAM N/A 02/12/2015   Procedure: LEFT HEART CATHETERIZATION WITH Beatrix Fetters;  Surgeon: Burnell Blanks,  MD;  Location: [facility] CATH LAB;  Service: Cardiovascular;  Laterality: N/A;  . PERCUTANEOUS CORONARY STENT INTERVENTION (PCI-S)  07/31/2013   Procedure: PERCUTANEOUS CORONARY STENT INTERVENTION (PCI-S);  Surgeon: Burnell Blanks, MD;  Location: [facility] CATH LAB;  Service: Cardiovascular;;  LIMA to LAD at the anastomosis with aortic root shot   . RIGHT/LEFT HEART CATH AND CORONARY/GRAFT ANGIOGRAPHY N/A 04/04/2018   Procedure: RIGHT/LEFT HEART CATH AND CORONARY/GRAFT ANGIOGRAPHY;  Surgeon: Martinique, Peter M, MD;  Location: [place] [facility];  Service: Cardiovascular;  Laterality: N/A;    Current Outpatient Medications  Medication Sig Dispense Refill  . allopurinol (ZYLOPRIM) 100 MG tablet Take 100 mg by mouth daily.  4  . amLODipine (NORVASC) 5 MG tablet TAKE 1 TABLET BY MOUTH DAILY 90 tablet 3  . atorvastatin (LIPITOR) 80 MG tablet TAKE ONE (1) TABLET EACH DAY 90 tablet 3  . bisoprolol (ZEBETA) 5 MG tablet TAKE 1/2 TABLET BY MOUTH TWICE DAILY 90 tablet 3  . clopidogrel (PLAVIX) 75 MG tablet TAKE 1 TABLET BY  MOUTH DAILY 90 tablet 3  . fexofenadine (ALLEGRA) 180 MG tablet Take 180 mg by mouth daily as needed for allergies.     . furosemide (LASIX) 40 MG tablet TAKE ONE TABLET BY MOUTH TWICE DAILY 180 tablet 3  . ipratropium-albuterol (DUONEB) 0.5-2.5 (3) MG/3ML SOLN Take 3 mLs by nebulization every 6 (six) hours as needed (for shortness of breath or wheezing).     . isosorbide mononitrate (IMDUR) 60 MG 24 hr tablet Take 1 tablet (60 mg total) by mouth 2 (two) times daily. 180 tablet 2  . nitroGLYCERIN (NITROSTAT) 0.4 MG SL tablet Place 0.4 mg under the tongue every 5 (five) minutes as needed for chest pain.    . pantoprazole (PROTONIX) 40 MG tablet TAKE ONE TABLET BY MOUTH ONCE DAILY 30 tablet 11  . potassium chloride SA (K-DUR,KLOR-CON) 20 MEQ tablet TAKE ONE TABLET BY MOUTH ONCE DAILY 90 tablet 3  . spironolactone (ALDACTONE) 25 MG tablet TAKE 1/2 TABLET BY MOUTH TWICE DAILY 90 tablet 2   No  current facility-administered medications for this visit.     No Known Allergies  Social History   Socioeconomic History  . Marital status: Married    Spouse name: Not on file  . Number of children: 1  . Years of education: Not on file  . Highest education level: Not on file  Occupational History  . Occupation: Plumbing    Comment: Retired in 2007  Social Needs  . Financial resource strain: Not on file  . Food insecurity:    Worry: Not on file    Inability: Not on file  . Transportation needs:    Medical: Not on file    Non-medical: Not on file  Tobacco Use  . Smoking status: Current Every Day Smoker    Packs/day: 1.00    Years: 57.00    Pack years: 57.00    Types: Cigarettes  . Smokeless tobacco: Never Used  Substance and Sexual Activity  . Alcohol use: Yes    Alcohol/week: 5.0 standard drinks    Types: 5 Cans of beer per week    Comment: daily-6 daily  . Drug use: No  . Sexual activity: Not on file  Lifestyle  . Physical activity:    Days per week: Not on file    Minutes per session: Not on file  . Stress: Not on file  Relationships  . Social connections:    Talks on phone: Not on file    Gets together: Not on file    Attends religious service: Not on file    Active member of club or organization: Not on file    Attends meetings of clubs or organizations: Not on file    Relationship status: Not on file  . Intimate partner violence:    Fear of current or ex partner: Not on file    Emotionally abused: Not on file    Physically abused: Not on file    Forced sexual activity: Not on file  Other Topics Concern  . Not on file  Social History Narrative   Lives in [place] with his family.  Does not routinely exercise.    Family History  Problem Relation Age of Onset  . Heart attack Mother        died @ 66.  Marland Kitchen Hypertension Mother   . Hypertension Father   . Hypertension Sister   . Emphysema Brother   . Hypertension Brother   . Allergies Sister   .  Stroke Neg Hx  Review of Systems:  As stated in the HPI and otherwise negative.   BP 128/70   Pulse 64   Ht 5\' 5"  (1.651 m)   Wt 148 lb 3.2 oz (67.2 kg)   SpO2 97%   BMI 24.66 kg/m   Physical Examination:  General: Well developed, well nourished, NAD  HEENT: OP clear, mucus membranes moist  SKIN: warm, dry. No rashes. Neuro: No focal deficits  Musculoskeletal: Muscle strength 5/5 all ext  Psychiatric: Mood and affect normal  Neck: No JVD, no carotid bruits, no thyromegaly, no lymphadenopathy.  Lungs:Clear bilaterally, no wheezes, rhonci, crackles Cardiovascular: Regular rate and rhythm. No murmurs, gallops or rubs. Abdomen:Soft. Bowel sounds present. Non-tender.  Extremities: No lower extremity edema. Pulses are 2 + in the bilateral DP/PT.  Cardiac Cath June 2019:    Non-stenotic Ost LM to LM lesion previously treated.  Ost LAD to Prox LAD lesion is 100% stenosed.  Ost Cx to Prox Cx lesion is 50% stenosed.  Ost RCA to Prox RCA lesion is 100% stenosed.  Previously placed Dist LAD stent (unknown type) is widely patent.  Previously placed Ost 1st Mrg stent (unknown type) is widely patent.  LV end diastolic pressure is normal.  Echo June 2019:  - Left ventricle: The cavity size was normal. Wall thickness was   increased in a pattern of mild LVH. Systolic function was normal.   The estimated ejection fraction was in the range of 50% to 55%.   Inferoseptal hypokinesis. The study is not technically sufficient   to allow evaluation of LV diastolic function. - Aortic valve: Moderate stenosis and moderate regurgitation. Mean   gradient (S): 20 mm Hg. Peak gradient (S): 43 mm Hg. - Mitral valve: Calcified annulus. Mildly thickened leaflets .   There was mild regurgitation. - Left atrium: Moderately dilated. - Right ventricle: The cavity size was normal. Systolic function   was low normal. - Inferior vena cava: The vessel was normal in size. The   respirophasic  diameter changes were in the normal range (>= 50%),   consistent with normal central venous pressure.  Impressions:  - LVEF 50-55%, mild LVH, inferoseptal hypokinesis, moderate aortic   stenosis with moderate AI and mean gradient of 20 mmHg, mild MR,   moderate LAE, noraml IvC.   EKG:  EKG is not  ordered today.  This demonstrates   Recent Labs: 03/27/2018: BUN 18; Creatinine, Ser 1.17; Hemoglobin 14.6; Platelets 354; Potassium 4.4; Sodium 141   Lipid Panel    Component Value Date/Time   CHOL 137 08/18/2015 1056   CHOL 146 03/31/2015 0905   TRIG 128 08/18/2015 1056   HDL 42 08/18/2015 1056   HDL 42 03/31/2015 0905   CHOLHDL 3.3 08/18/2015 1056   VLDL 26 08/18/2015 1056   LDLCALC 69 08/18/2015 1056   LDLCALC 77 03/31/2015 0905     Wt Readings from Last 3 Encounters:  05/26/18 148 lb 3.2 oz (67.2 kg)  04/26/18 146 lb (66.2 kg)  04/04/18 147 lb (66.7 kg)     Other studies Reviewed: Additional studies/ records that were reviewed today include:  Review of the above records demonstrates:    Assessment and Plan:   1. CAD s/p CABG with stable angina: He had CABG in 1993 and has had multiple stenting procedures since then. Last cath in June 2019 with stable CAD. The LIMA to his LAD is patent, stent from LM to Circ is patent, RCA occluded with filling by collaterals. Right heart pressures were ok.  He continues to smoke. Will continue Plavix, statin, beta blocker, Norvasc and Imdur.  Will stop ASA due to bruising and easy bleeding.   2. Hyperlipidemia: Lipids followed in primary care. Continue statin.   3. Aortic valve disease: Moderate AS and mild AI by echo June 2019.  4. HTN: BP is controlled.  5. Tobacco abuse: He refuses to stop smoking  6. Ischemic cardiomyopathy/Chronic diastolic CHF: PNTI=14-43% by echo June 2019. Weight is stable.      7. Dyspnea: His heart disease is stable but he is visibly dyspneic. He has smoked for over 60 years and has audible upper airway  wheezing noted sitting across the room. He most likely has COPD but he was seen in 2016 by Dr. Melvyn Novas and was not felt to have significant COPD at that time.  Will refer back to Pulmonary for another evaluation. He has a nebulizer that he uses prn, prescribed by primary care. This seems to help his dyspnea.    Current medicines are reviewed at length with the patient today.  The patient does not have concerns regarding medicines.  The following changes have been made:    Labs/ tests ordered today include:   Orders Placed This Encounter  Procedures  . Ambulatory referral to Pulmonology    Disposition:   FU with me 6 months  Signed, Lauree Chandler, MD 05/26/2018 3:34 PM    [facility] [address], [place], [place]  [address] Phone: [phone]; Fax: [phone]

## 2018-06-05 ENCOUNTER — Other Ambulatory Visit: Payer: Self-pay

## 2018-06-05 ENCOUNTER — Emergency Department (HOSPITAL_COMMUNITY): Payer: Medicare Other

## 2018-06-05 ENCOUNTER — Inpatient Hospital Stay (HOSPITAL_COMMUNITY)
Admission: EM | Admit: 2018-06-05 | Discharge: 2018-06-08 | DRG: 351 | Disposition: A | Discharge status: Home / Self Care | Payer: Medicare Other | Attending: Internal Medicine | Admitting: Internal Medicine

## 2018-06-05 DIAGNOSIS — K4031 Unilateral inguinal hernia, with obstruction, without gangrene, recurrent: Secondary | ICD-10-CM | POA: Diagnosis not present

## 2018-06-05 DIAGNOSIS — I509 Heart failure, unspecified: Secondary | ICD-10-CM | POA: Diagnosis present

## 2018-06-05 DIAGNOSIS — I110 Hypertensive heart disease with heart failure: Secondary | ICD-10-CM | POA: Diagnosis not present

## 2018-06-05 DIAGNOSIS — R109 Unspecified abdominal pain: Secondary | ICD-10-CM | POA: Diagnosis not present

## 2018-06-05 DIAGNOSIS — I255 Ischemic cardiomyopathy: Secondary | ICD-10-CM | POA: Diagnosis present

## 2018-06-05 DIAGNOSIS — Z951 Presence of aortocoronary bypass graft: Secondary | ICD-10-CM

## 2018-06-05 DIAGNOSIS — G8918 Other acute postprocedural pain: Secondary | ICD-10-CM | POA: Diagnosis not present

## 2018-06-05 DIAGNOSIS — K219 Gastro-esophageal reflux disease without esophagitis: Secondary | ICD-10-CM | POA: Diagnosis present

## 2018-06-05 DIAGNOSIS — N183 Chronic kidney disease, stage 3 (moderate): Secondary | ICD-10-CM | POA: Diagnosis present

## 2018-06-05 DIAGNOSIS — I252 Old myocardial infarction: Secondary | ICD-10-CM

## 2018-06-05 DIAGNOSIS — K5900 Constipation, unspecified: Secondary | ICD-10-CM | POA: Diagnosis present

## 2018-06-05 DIAGNOSIS — I739 Peripheral vascular disease, unspecified: Secondary | ICD-10-CM | POA: Diagnosis not present

## 2018-06-05 DIAGNOSIS — I130 Hypertensive heart and chronic kidney disease with heart failure and stage 1 through stage 4 chronic kidney disease, or unspecified chronic kidney disease: Secondary | ICD-10-CM | POA: Diagnosis present

## 2018-06-05 DIAGNOSIS — Z79899 Other long term (current) drug therapy: Secondary | ICD-10-CM | POA: Diagnosis not present

## 2018-06-05 DIAGNOSIS — I700 Atherosclerosis of aorta: Secondary | ICD-10-CM | POA: Diagnosis not present

## 2018-06-05 DIAGNOSIS — K4030 Unilateral inguinal hernia, with obstruction, without gangrene, not specified as recurrent: Secondary | ICD-10-CM | POA: Diagnosis present

## 2018-06-05 DIAGNOSIS — Z7902 Long term (current) use of antithrombotics/antiplatelets: Secondary | ICD-10-CM

## 2018-06-05 DIAGNOSIS — I10 Essential (primary) hypertension: Secondary | ICD-10-CM | POA: Diagnosis not present

## 2018-06-05 DIAGNOSIS — F17210 Nicotine dependence, cigarettes, uncomplicated: Secondary | ICD-10-CM | POA: Diagnosis not present

## 2018-06-05 DIAGNOSIS — J449 Chronic obstructive pulmonary disease, unspecified: Secondary | ICD-10-CM | POA: Diagnosis not present

## 2018-06-05 DIAGNOSIS — Z01818 Encounter for other preprocedural examination: Secondary | ICD-10-CM | POA: Diagnosis not present

## 2018-06-05 DIAGNOSIS — K4090 Unilateral inguinal hernia, without obstruction or gangrene, not specified as recurrent: Secondary | ICD-10-CM | POA: Diagnosis not present

## 2018-06-05 DIAGNOSIS — E876 Hypokalemia: Secondary | ICD-10-CM | POA: Diagnosis not present

## 2018-06-05 DIAGNOSIS — I714 Abdominal aortic aneurysm, without rupture: Secondary | ICD-10-CM | POA: Diagnosis present

## 2018-06-05 DIAGNOSIS — I350 Nonrheumatic aortic (valve) stenosis: Secondary | ICD-10-CM | POA: Diagnosis not present

## 2018-06-05 DIAGNOSIS — Z955 Presence of coronary angioplasty implant and graft: Secondary | ICD-10-CM

## 2018-06-05 DIAGNOSIS — I2510 Atherosclerotic heart disease of native coronary artery without angina pectoris: Secondary | ICD-10-CM | POA: Diagnosis present

## 2018-06-05 DIAGNOSIS — I723 Aneurysm of iliac artery: Secondary | ICD-10-CM | POA: Diagnosis not present

## 2018-06-05 DIAGNOSIS — E785 Hyperlipidemia, unspecified: Secondary | ICD-10-CM | POA: Diagnosis not present

## 2018-06-05 DIAGNOSIS — K5792 Diverticulitis of intestine, part unspecified, without perforation or abscess without bleeding: Secondary | ICD-10-CM | POA: Diagnosis not present

## 2018-06-05 DIAGNOSIS — I5023 Acute on chronic systolic (congestive) heart failure: Secondary | ICD-10-CM | POA: Diagnosis not present

## 2018-06-05 DIAGNOSIS — E7800 Pure hypercholesterolemia, unspecified: Secondary | ICD-10-CM | POA: Diagnosis not present

## 2018-06-05 LAB — URINALYSIS, ROUTINE W REFLEX MICROSCOPIC
Bilirubin Urine: NEGATIVE
GLUCOSE, UA: NEGATIVE mg/dL
KETONES UR: NEGATIVE mg/dL
Leukocytes, UA: NEGATIVE
Nitrite: NEGATIVE
PROTEIN: NEGATIVE mg/dL
Specific Gravity, Urine: 1.011 (ref 1.005–1.030)
pH: 5 (ref 5.0–8.0)

## 2018-06-05 LAB — COMPREHENSIVE METABOLIC PANEL
ALBUMIN: 3.9 g/dL (ref 3.5–5.0)
ALT: 21 U/L (ref 0–44)
AST: 29 U/L (ref 15–41)
Alkaline Phosphatase: 68 U/L (ref 38–126)
Anion gap: 13 (ref 5–15)
BUN: 18 mg/dL (ref 8–23)
CHLORIDE: 104 mmol/L (ref 98–111)
CO2: 21 mmol/L — AB (ref 22–32)
CREATININE: 1.36 mg/dL — AB (ref 0.61–1.24)
Calcium: 9.2 mg/dL (ref 8.9–10.3)
GFR calc Af Amer: 58 mL/min — ABNORMAL LOW (ref >60)
GFR calc non Af Amer: 50 mL/min — ABNORMAL LOW (ref >60)
Glucose, Bld: 87 mg/dL (ref 70–99)
Potassium: 4.3 mmol/L (ref 3.5–5.1)
SODIUM: 138 mmol/L (ref 135–145)
Total Bilirubin: 1 mg/dL (ref 0.3–1.2)
Total Protein: 7.1 g/dL (ref 6.5–8.1)

## 2018-06-05 LAB — CBC WITH DIFFERENTIAL/PLATELET
ABS IMMATURE GRANULOCYTES: 0 10*3/uL (ref 0.0–0.1)
BASOS ABS: 0.1 10*3/uL (ref 0.0–0.1)
BASOS PCT: 1 %
Eosinophils Absolute: 0.2 10*3/uL (ref 0.0–0.7)
Eosinophils Relative: 1 %
HCT: 43.3 % (ref 39.0–52.0)
Hemoglobin: 13.9 g/dL (ref 13.0–17.0)
IMMATURE GRANULOCYTES: 0 %
Lymphocytes Relative: 15 %
Lymphs Abs: 1.7 10*3/uL (ref 0.7–4.0)
MCH: 32.6 pg (ref 26.0–34.0)
MCHC: 32.1 g/dL (ref 30.0–36.0)
MCV: 101.6 fL — ABNORMAL HIGH (ref 78.0–100.0)
MONOS PCT: 11 %
Monocytes Absolute: 1.2 10*3/uL — ABNORMAL HIGH (ref 0.1–1.0)
NEUTROS ABS: 8.1 10*3/uL — AB (ref 1.7–7.7)
NEUTROS PCT: 72 %
PLATELETS: 251 10*3/uL (ref 150–400)
RBC: 4.26 MIL/uL (ref 4.22–5.81)
RDW: 13.3 % (ref 11.5–15.5)
WBC: 11.2 10*3/uL — ABNORMAL HIGH (ref 4.0–10.5)

## 2018-06-05 LAB — LIPASE, BLOOD: Lipase: 44 U/L (ref 11–51)

## 2018-06-05 MED ORDER — SODIUM CHLORIDE 0.9 % IV SOLN
3.0000 g | Freq: Once | INTRAVENOUS | Status: AC
  Administered 2018-06-05: 3 g via INTRAVENOUS
  Filled 2018-06-05: qty 3

## 2018-06-05 MED ORDER — FENTANYL CITRATE (PF) 100 MCG/2ML IJ SOLN
50.0000 ug | Freq: Once | INTRAMUSCULAR | Status: AC
  Administered 2018-06-05: 50 ug via INTRAVENOUS
  Filled 2018-06-05: qty 2

## 2018-06-05 MED ORDER — IOPAMIDOL (ISOVUE-300) INJECTION 61%
INTRAVENOUS | Status: AC
  Filled 2018-06-05: qty 100

## 2018-06-05 MED ORDER — IOPAMIDOL (ISOVUE-300) INJECTION 61%
100.0000 mL | Freq: Once | INTRAVENOUS | Status: AC | PRN
  Administered 2018-06-05: 100 mL via INTRAVENOUS

## 2018-06-05 NOTE — ED Notes (Signed)
States has not had a significant bowel movement in 3-4 days.

## 2018-06-05 NOTE — ED Provider Notes (Signed)
[facility] EMERGENCY DEPARTMENT Provider Note   CSN: [id number] Arrival date & time: 06/05/18  1655     History   Chief Complaint Chief Complaint  Patient presents with  . Hernia    HPI Steve Durham is a 72 y.o. male.  HPI  The patient is a 72 year old male, he has a history of multiple heart problems including needing a bypass graft in the past as well as multiple stents, he has COPD and continues to smoke and is known to have a left inguinal hernia which clinically has been present for over 6 months.  This rarely causes any problems but recently has started to have some increasing cramping which is also intermittent in that area.  He is mildly nauseated but not vomiting, his stools have slowed down significantly and whereas he usually has 2 stools a day or 3 stools a day he is now having 1 stool every other day and they are very small.  He has increased abdominal pain, he has no fevers, no chills, no dysuria.  He reports that his hernia has not changed in size though it appears to be more tender recently.  He has not been seen by general surgeon for this however he has had a prior surgical repair of this in the past.  He was many years ago, he does not remember where he was done or who the surgeon was.  He is followed by Dr. Vassie Loll Eyk in [place].  Past Medical History:  Diagnosis Date  . Abnormal CT scan, kidney    07/2012 - multiple small cysts  . Aortic stenosis    a. Mod AS/AI by cath 07/2012.;  b.  Echo (07/31/13) is: EF 55-60%, mild aortic stenosis (mean gradient 13);  c. Echo (5/15):  EF 40-45%, mild AS (mean 12 mmHg), mod AI  . Back pain   . Carotid artery occlusion    a. Dopplers 07/2012: no significant high grade obstruction.  . Cholelithiasis    Seen on CT 07/2012  . Coronary artery disease    a. CABG 10/1991. b. cath 07/2012 with prog dsz, turned down for re-do CABG - for med rx for now.; c. NSTEMI/PCI: DES to mLAD ext into IMA graft;  d. Inf  STEMI (5/15):  LM 60-70% then 99% before LAD, pLAD occl, pCFX stent 80+% ISR, oRCA 99% then occl (L-R collats to dRCA), L-LAD ok with patent stent, S-CFX occl (old), S-RCA occl (old); PCI: LM ext into CFX with Xience Alpine DES  . Heart failure (HCC) systolic  . Hyperlipidemia   . Hyperlipoproteinemia   . Hypertension   . Ischemic cardiomyopathy    a. 2D ECHO: 08/02/2014; EF 45%; severe hypokinesis base/mid-inferolat segments and severe hypokinesis base inferior segment. Mild LVH, mild AS (may be underestimated due to LV dysfunction).  Mean gradient (S): 14 mm Hg. Peak gradient (S): 25 mm Hg. VTI ratio of LVOT to aortic valve: 0.37. Mod LA dilation. Mild RV systolic dysfxn     Patient Active Problem List   Diagnosis Date Noted  . Dyspnea 04/04/2018  . Hypertension   . Hyperlipoproteinemia   . Heart failure ([place])   . Cholelithiasis   . Carotid artery occlusion   . Back pain   . Abnormal CT scan, kidney   . Other chest pain 05/15/2015  . Upper airway cough syndrome 03/22/2015  . Acute on chronic systolic heart failure, NYHA class 4 ([place])   . Acute renal insufficiency 03/20/2015  . COPD  GOLD II/ still smoking  03/19/2015  . Chronic systolic CHF (congestive heart failure) ([place]) 08/04/2014  . Acute coronary syndrome ([place]) 08/01/2014  . Ischemic cardiomyopathy with EF 40-45% 03/02/2014  . Elevated INR 03/01/2014  . Aspiration pneumonia ([place]) 08/02/2013  . NSTEMI (non-ST elevated myocardial infarction) ([place]) 08/01/2013  . Community acquired pneumonia 08/01/2013  . Post/Lat STEMI - s/p DES to LM and PTCA of CFX in-stent restenosis this admission 07/31/2013  . Aortic stenosis, mild Oct 2015 07/31/2013  . Cigarette smoker 07/31/2013  . Habitual alcohol use 07/31/2013  . Aortic stenosis  08/11/2012  . Unstable angina ([place]) 08/11/2012  . Hyperlipidemia 08/11/2012  . Coronary artery disease 07/04/2011  . CAROTID ARTERY DISEASE 07/14/2009  . Essential hypertension 02/06/2009  . CABG '98,  cath Oct 2014- LAD DES placed. 02/06/2009  . BACK PAIN 02/06/2009    Past Surgical History:  Procedure Laterality Date  . CARDIAC CATHETERIZATION  04/03/99  . CARDIAC CATHETERIZATION N/A 03/20/2015   Procedure: Left Heart Cath and Cors/Grafts Angiography;  Surgeon: Jettie Booze, MD;  Location: [facility];  Service: Cardiovascular;  Laterality: N/A;  . CARDIAC CATHETERIZATION N/A 04/28/2016   Procedure: Right/Left Heart Cath and Coronary/Graft Angiography;  Surgeon: Burnell Blanks, MD;  Location: [facility];  Service: Cardiovascular;  Laterality: N/A;  . CARPAL TUNNEL RELEASE  2007   Excision mass dorsal left wrist  . CORONARY ARTERY BYPASS GRAFT  1993  . FASCIOTOMY Right 07/30/2013   Procedure: OPEN FASCIOTOMY RIGHT RING FINGER & RIGHT SMALL FINGER MULTIPLE LEVELS;  Surgeon: Wynonia Sours, MD;  Location: [facility];  Service: Orthopedics;  Laterality: Right;  . HERNIA REPAIR  1988  . LEFT HEART CATHETERIZATION WITH CORONARY ANGIOGRAM N/A 02/28/2014   Procedure: LEFT HEART CATHETERIZATION WITH CORONARY ANGIOGRAM;  Surgeon: Troy Sine, MD;  Location: [facility] CATH LAB;  Service: Cardiovascular;  Laterality: N/A;  . LEFT HEART CATHETERIZATION WITH CORONARY/GRAFT ANGIOGRAM N/A 08/10/2012   Procedure: LEFT HEART CATHETERIZATION WITH Beatrix Fetters;  Surgeon: Hillary Bow, MD;  Location: [facility] CATH LAB;  Service: Cardiovascular;  Laterality: N/A;  . LEFT HEART CATHETERIZATION WITH CORONARY/GRAFT ANGIOGRAM N/A 08/01/2014   Procedure: LEFT HEART CATHETERIZATION WITH Beatrix Fetters;  Surgeon: Leonie Man, MD;  Location: [facility] CATH LAB;  Service: Cardiovascular;  Laterality: N/A;  . LEFT HEART CATHETERIZATION WITH CORONARY/GRAFT ANGIOGRAM N/A 02/12/2015   Procedure: LEFT HEART CATHETERIZATION WITH Beatrix Fetters;  Surgeon: Burnell Blanks, MD;  Location: [facility] CATH LAB;  Service: Cardiovascular;  Laterality: N/A;  . PERCUTANEOUS  CORONARY STENT INTERVENTION (PCI-S)  07/31/2013   Procedure: PERCUTANEOUS CORONARY STENT INTERVENTION (PCI-S);  Surgeon: Burnell Blanks, MD;  Location: [facility] CATH LAB;  Service: Cardiovascular;;  LIMA to LAD at the anastomosis with aortic root shot   . RIGHT/LEFT HEART CATH AND CORONARY/GRAFT ANGIOGRAPHY N/A 04/04/2018   Procedure: RIGHT/LEFT HEART CATH AND CORONARY/GRAFT ANGIOGRAPHY;  Surgeon: Martinique, Peter M, MD;  Location: [facility];  Service: Cardiovascular;  Laterality: N/A;        Home Medications    Prior to Admission medications   Medication Sig Start Date End Date Taking? Authorizing Provider  allopurinol (ZYLOPRIM) 100 MG tablet Take 100 mg by mouth daily. 10/17/17  Yes [provider]  amLODipine (NORVASC) 5 MG tablet TAKE 1 TABLET BY MOUTH DAILY Patient taking differently: Take 5 mg by mouth daily.  05/05/18  Yes Burnell Blanks, MD  atorvastatin (LIPITOR) 80 MG tablet TAKE ONE (1) TABLET  [place] Patient taking differently: Take 80 mg by mouth daily at 6 PM.  06/02/17  Yes McAlhany, Annita Brod, MD  bisoprolol (ZEBETA) 5 MG tablet TAKE 1/2 TABLET BY MOUTH TWICE DAILY Patient taking differently: Take 2.5 mg by mouth 2 (two) times daily.  05/15/18  Yes Burnell Blanks, MD  clopidogrel (PLAVIX) 75 MG tablet TAKE 1 TABLET BY MOUTH DAILY Patient taking differently: Take 75 mg by mouth daily.  05/12/18  Yes Burnell Blanks, MD  fexofenadine (ALLEGRA) 180 MG tablet Take 180 mg by mouth daily as needed for allergies.    Yes [provider]  furosemide (LASIX) 40 MG tablet TAKE ONE TABLET BY MOUTH TWICE DAILY Patient taking differently: Take 40 mg by mouth 2 (two) times daily.  04/19/18  Yes Burnell Blanks, MD  ipratropium-albuterol (DUONEB) 0.5-2.5 (3) MG/3ML SOLN Take 3 mLs by nebulization every 6 (six) hours as needed (for shortness of breath or wheezing).    Yes [provider]  isosorbide mononitrate (IMDUR) 60 MG  24 hr tablet Take 1 tablet (60 mg total) by mouth 2 (two) times daily. 09/23/17  Yes Burnell Blanks, MD  nitroGLYCERIN (NITROSTAT) 0.4 MG SL tablet Place 0.4 mg under the tongue every 5 (five) minutes as needed for chest pain.   Yes [provider]  pantoprazole (PROTONIX) 40 MG tablet TAKE ONE TABLET BY MOUTH ONCE DAILY Patient taking differently: Take 40 mg by mouth daily.  08/08/17  Yes Burnell Blanks, MD  potassium chloride SA (K-DUR,KLOR-CON) 20 MEQ tablet TAKE ONE TABLET BY MOUTH ONCE DAILY Patient taking differently: Take 20 mEq by mouth daily.  05/05/18  Yes Burnell Blanks, MD  spironolactone (ALDACTONE) 25 MG tablet TAKE 1/2 TABLET BY MOUTH TWICE DAILY Patient taking differently: Take 12.5 mg by mouth 2 (two) times daily.  02/16/18  Yes Burnell Blanks, MD    Family History Family History  Problem Relation Age of Onset  . Heart attack Mother        died @ 75.  Marland Kitchen Hypertension Mother   . Hypertension Father   . Hypertension Sister   . Emphysema Brother   . Hypertension Brother   . Allergies Sister   . Stroke Neg Hx     Social History Social History   Tobacco Use  . Smoking status: Current Every Day Smoker    Packs/day: 1.00    Years: 57.00    Pack years: 57.00    Types: Cigarettes  . Smokeless tobacco: Never Used  Substance Use Topics  . Alcohol use: Yes    Alcohol/week: 5.0 standard drinks    Types: 5 Cans of beer per week    Comment: daily-6 daily  . Drug use: No     Allergies   Patient has no known allergies.   Review of Systems Review of Systems  All other systems reviewed and are negative.    Physical Exam Updated Vital Signs BP 107/71   Pulse 69   Temp 98.3 F (36.8 C) (Oral)   Resp 20   SpO2 99%   Physical Exam  Constitutional: He appears well-developed and well-nourished. No distress.  HENT:  Head: Normocephalic and atraumatic.  Mouth/Throat: Oropharynx is clear and moist. No oropharyngeal  exudate.  Eyes: Pupils are equal, round, and reactive to light. Conjunctivae and EOM are normal. Right eye exhibits no discharge. Left eye exhibits no discharge. No scleral icterus.  Neck: Normal range of motion. Neck supple. No JVD present. No thyromegaly  present.  Cardiovascular: Normal rate, regular rhythm and intact distal pulses. Exam reveals no gallop and no friction rub.  Murmur ( Systolic murmur) heard. Pulmonary/Chest: Effort normal. No respiratory distress. He has wheezes. He has no rales.  Mild tachypnea, expiratory wheezing present, speaks in full sentences  Abdominal: Soft. Bowel sounds are normal. He exhibits no distension and no mass. There is no tenderness.  Abdomen is soft except for the left lower quadrant where there is some mild guarding and tenderness, the left inguinal region has a mass which is tender and slightly swollen.  There is no redness overlying the skin.  The scrotum and penis appear normal otherwise.  Genitourinary:  Genitourinary Comments: Large left inguinal hernia, mildly tender  Musculoskeletal: Normal range of motion. He exhibits no edema or tenderness.  Normal extremities without edema  Lymphadenopathy:    He has no cervical adenopathy.  Neurological: He is alert. Coordination normal.  Normal gait, normal speech, normal mentation  Skin: Skin is warm and dry. No rash noted. No erythema.  Psychiatric: He has a normal mood and affect. His behavior is normal.  Nursing note and vitals reviewed.    ED Treatments / Results  Labs (all labs ordered are listed, but only abnormal results are displayed) Labs Reviewed  CBC WITH DIFFERENTIAL/PLATELET - Abnormal; Notable for the following components:      Result Value   WBC 11.2 (*)    MCV 101.6 (*)    Neutro Abs 8.1 (*)    Monocytes Absolute 1.2 (*)    All other components within normal limits  COMPREHENSIVE METABOLIC PANEL - Abnormal; Notable for the following components:   CO2 21 (*)    Creatinine, Ser  1.36 (*)    GFR calc non Af Amer 50 (*)    GFR calc Af Amer 58 (*)    All other components within normal limits  URINALYSIS, ROUTINE W REFLEX MICROSCOPIC - Abnormal; Notable for the following components:   Hgb urine dipstick SMALL (*)    Bacteria, UA RARE (*)    All other components within normal limits  LIPASE, BLOOD    EKG None  Radiology Ct Abdomen Pelvis W Contrast  Result Date: 06/05/2018 CLINICAL DATA:  Incarcerated inguinal hernia. Significant constant pain for the last 3 days. History of prior hernia repair approximately 40 years ago. EXAM: CT ABDOMEN AND PELVIS WITH CONTRAST TECHNIQUE: Multidetector CT imaging of the abdomen and pelvis was performed using the standard protocol following bolus administration of intravenous contrast. CONTRAST:  157mL ISOVUE-300 IOPAMIDOL (ISOVUE-300) INJECTION 61% COMPARISON:  None. FINDINGS: Lower chest: No acute abnormality. Hepatobiliary: Multiple gallstones. No evidence of acute cholecystitis. No focal liver abnormality. No bile duct dilatation. Pancreas: Unremarkable. No pancreatic ductal dilatation or surrounding inflammatory changes. Spleen: Normal in size without focal abnormality. Adrenals/Urinary Tract: Bilateral renal cysts. Kidneys otherwise unremarkable without suspicious mass, stone or hydronephrosis. No ureteral or bladder calculi identified. Bladder is decompressed. Stomach/Bowel: LEFT inguinal hernia which contains the lower descending colon and upper sigmoid colon. There is at least mild thickening of the walls of the colon within the hernia and there is fluid stranding/inflammation within the LEFT inguinal hernia sac. More proximal colon is not distended, therefore, no evidence of associated bowel obstruction. There is diverticulosis throughout the sigmoid and descending colon. Stomach appears normal, partially decompressed. No small bowel wall thickening or evidence of small bowel inflammation. Appendix is normal. Vascular/Lymphatic:  Extensive atherosclerosis of the abdominal aorta. Mild aneurysmal dilatation of the lower abdominal aorta, measuring 3.2  cm diameter, with associated mural thrombus. 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 There is milder aneurysmal dilatation of the proximal RIGHT common iliac artery, measuring 1.9 cm diameter. No acute appearing vascular abnormality. No enlarged lymph nodes seen in the abdomen or pelvis. Reproductive: Prostate is unremarkable. Other: No abscess collection identified. No free intraperitoneal air. Musculoskeletal: No acute or suspicious osseous finding. Degenerative changes are seen throughout the thoracolumbar spine, mild to moderate in degree. IMPRESSION: 1. LEFT inguinal hernia contains a portion of the lower descending colon and/or upper sigmoid colon. There is inflammation/fluid stranding within the LEFT inguinal hernia and at least mild thickening of the walls of the involved segment of colon. This inflammatory change may indicate some degree of intermittent incarceration, however, there is no current evidence of associated bowel obstruction (the more proximal portion of the colon is not distended). Alternatively, findings could be related to an acute diverticulitis of the involved segment of colon, as there is extensive diverticulosis throughout the descending and sigmoid colon. 2. 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 Recommend followup by ultrasound in 3 years. This recommendation follows ACR consensus guidelines: White Paper of the ACR Incidental Findings Committee II on Vascular Findings. J Am Coll Radiol 2013; 16:109-604 3. **An incidental finding of potential clinical significance has been found. 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 Recommend nonemergent vascular surgery consultation for further workup and/or follow-up considerations.** 4. Cholelithiasis without evidence of acute cholecystitis. 5. Aortic atherosclerosis. 6. Additional chronic/incidental findings detailed above. Electronically Signed   By: Franki Cabot M.D.   On: 06/05/2018 21:11    Procedures Procedures (including critical care time)  Medications Ordered in ED Medications  iopamidol (ISOVUE-300) 61 % injection (has no administration in time range)  Ampicillin-Sulbactam (UNASYN) 3 g in sodium chloride 0.9 % 100 mL IVPB (has no administration in time range)  iopamidol (ISOVUE-300) 61 % injection 100 mL (100 mLs Intravenous Contrast Given 06/05/18 2039)  fentaNYL (SUBLIMAZE) injection 50 mcg (50 mcg Intravenous Given 06/05/18 2225)     Initial Impression / Assessment and Plan / ED Course  I have reviewed the triage vital signs and the nursing notes.  Pertinent labs & imaging results that were available during my care of the patient were reviewed by me and considered in my medical decision making (see chart for details).    The patient does have a slight leukocytosis, he has normal electrolytes, slight renal dysfunction  CT scan shows that he does have a left-sided inguinal hernia, there is some inflammatory changes of the bowel in this region, this could be consistent with diverticulitis, the actual hernia is not new and in fact is probably been there for many months however he is now having decreased stools, increased pain suggestive that this could be related to either the hernia or up inflammatory bowel process.  Pain medications ordered, n.p.o. until I discussed with general surgery.  I was not able to reduce the hernia at the bedside.  I discussed the patient's care with Dr. Redmond Pulling of the general surgery service who agrees to have the surgical service see the patient in the morning.  Due to his multiple comorbidities and the likelihood  that there is some type of inflammatory bowel process going on he wants him to be admitted  to the medical service, antibiotics and some observation.  He will need to have his Plavix held overnight in case he needs to have surgery however at this time he is not obstructed.  D/w Dr. Jonelle Sidle who will admit  Final Clinical Impressions(s) / ED Diagnoses   Final diagnoses:  Unilateral inguinal hernia without obstruction or gangrene, recurrence not specified  Diverticulitis      Noemi Chapel, MD 06/05/18 2306

## 2018-06-05 NOTE — H&P (View-Only) (Signed)
Reason for Consult:incarcerated left inguinal  Referring Physician: Dr Francesca Oman [name] is an 72 y.o. male.  HPI: 72 year old male with coronary artery disease status post CABG and multiple stents, COPD, ongoing tobacco abuse, hypertension, history of ischemic cardiomyopathy was sent to the emergency room by his primary care physician for concerns of incarcerated left inguinal hernia.  He states that he had a prior inguinal hernia repair many years ago.  He states about 6 months ago he started noticing a recurrent bulge in the left groin.  It is constant.  It does not come and go anymore.  The bulge has been present for many months causing some intermittent discomfort however a few days ago he started having fairly constant severe discomfort in his left groin.  He also reports a change in his bowel movement consistency.  He states normally has 2-3 good bowel movements per day but the last few days he just has had small bowel movements.  He reports lots of flatus.  He denies any nausea or vomiting.  He denies any fever or chills.  He smokes about 1 pack/day.  He is not terribly interested in stopping smoking.  He does endorse what sounds like claudication.  Denies rest pain, chest pain, TIAs or angina  On Plavix  Past Medical History:  Diagnosis Date  . Abnormal CT scan, kidney    07/2012 - multiple small cysts  . Aortic stenosis    a. Mod AS/AI by cath 07/2012.;  b.  Echo (07/31/13) is: EF 55-60%, mild aortic stenosis (mean gradient 13);  c. Echo (5/15):  EF 40-45%, mild AS (mean 12 mmHg), mod AI  . Back pain   . Carotid artery occlusion    a. Dopplers 07/2012: no significant high grade obstruction.  . Cholelithiasis    Seen on CT 07/2012  . Coronary artery disease    a. CABG 10/1991. b. cath 07/2012 with prog dsz, turned down for re-do CABG - for med rx for now.; c. NSTEMI/PCI: DES to mLAD ext into IMA graft;  d. Inf STEMI (5/15):  LM 60-70% then 99% before LAD, pLAD occl, pCFX stent  80+% ISR, oRCA 99% then occl (L-R collats to dRCA), L-LAD ok with patent stent, S-CFX occl (old), S-RCA occl (old); PCI: LM ext into CFX with Xience Alpine DES  . Heart failure (HCC) systolic  . Hyperlipidemia   . Hyperlipoproteinemia   . Hypertension   . Ischemic cardiomyopathy    a. 2D ECHO: 08/02/2014; EF 45%; severe hypokinesis base/mid-inferolat segments and severe hypokinesis base inferior segment. Mild LVH, mild AS (may be underestimated due to LV dysfunction).  Mean gradient (S): 14 mm Hg. Peak gradient (S): 25 mm Hg. VTI ratio of LVOT to aortic valve: 0.37. Mod LA dilation. Mild RV systolic dysfxn     Past Surgical History:  Procedure Laterality Date  . CARDIAC CATHETERIZATION  04/03/99  . CARDIAC CATHETERIZATION N/A 03/20/2015   Procedure: Left Heart Cath and Cors/Grafts Angiography;  Surgeon: Jettie Booze, MD;  Location: [facility];  Service: Cardiovascular;  Laterality: N/A;  . CARDIAC CATHETERIZATION N/A 04/28/2016   Procedure: Right/Left Heart Cath and Coronary/Graft Angiography;  Surgeon: Burnell Blanks, MD;  Location: [facility];  Service: Cardiovascular;  Laterality: N/A;  . CARPAL TUNNEL RELEASE  2007   Excision mass dorsal left wrist  . CORONARY ARTERY BYPASS GRAFT  1993  . FASCIOTOMY Right 07/30/2013   Procedure: OPEN FASCIOTOMY RIGHT RING FINGER & RIGHT SMALL FINGER  MULTIPLE LEVELS;  Surgeon: Wynonia Sours, MD;  Location: [facility];  Service: Orthopedics;  Laterality: Right;  . HERNIA REPAIR  1988  . LEFT HEART CATHETERIZATION WITH CORONARY ANGIOGRAM N/A 02/28/2014   Procedure: LEFT HEART CATHETERIZATION WITH CORONARY ANGIOGRAM;  Surgeon: Troy Sine, MD;  Location: [facility] CATH LAB;  Service: Cardiovascular;  Laterality: N/A;  . LEFT HEART CATHETERIZATION WITH CORONARY/GRAFT ANGIOGRAM N/A 08/10/2012   Procedure: LEFT HEART CATHETERIZATION WITH Beatrix Fetters;  Surgeon: Hillary Bow, MD;  Location: [facility] CATH LAB;   Service: Cardiovascular;  Laterality: N/A;  . LEFT HEART CATHETERIZATION WITH CORONARY/GRAFT ANGIOGRAM N/A 08/01/2014   Procedure: LEFT HEART CATHETERIZATION WITH Beatrix Fetters;  Surgeon: Leonie Man, MD;  Location: [facility] CATH LAB;  Service: Cardiovascular;  Laterality: N/A;  . LEFT HEART CATHETERIZATION WITH CORONARY/GRAFT ANGIOGRAM N/A 02/12/2015   Procedure: LEFT HEART CATHETERIZATION WITH Beatrix Fetters;  Surgeon: Burnell Blanks, MD;  Location: [facility] CATH LAB;  Service: Cardiovascular;  Laterality: N/A;  . PERCUTANEOUS CORONARY STENT INTERVENTION (PCI-S)  07/31/2013   Procedure: PERCUTANEOUS CORONARY STENT INTERVENTION (PCI-S);  Surgeon: Burnell Blanks, MD;  Location: [facility] CATH LAB;  Service: Cardiovascular;;  LIMA to LAD at the anastomosis with aortic root shot   . RIGHT/LEFT HEART CATH AND CORONARY/GRAFT ANGIOGRAPHY N/A 04/04/2018   Procedure: RIGHT/LEFT HEART CATH AND CORONARY/GRAFT ANGIOGRAPHY;  Surgeon: Martinique, Peter M, MD;  Location: [facility];  Service: Cardiovascular;  Laterality: N/A;    Family History  Problem Relation Age of Onset  . Heart attack Mother        died @ 6.  Marland Kitchen Hypertension Mother   . Hypertension Father   . Hypertension Sister   . Emphysema Brother   . Hypertension Brother   . Allergies Sister   . Stroke Neg Hx     Social History:  reports that he has been smoking cigarettes. He has a 57.00 pack-year smoking history. He has never used smokeless tobacco. He reports that he drinks about 5.0 standard drinks of alcohol per week. He reports that he does not use drugs.  Allergies: No Known Allergies  Medications: I have reviewed the patient's current medications.  Results for orders placed or performed during the hospital encounter of 06/05/18 (from the past 48 hour(s))  CBC with Differential     Status: Abnormal   Collection Time: 06/05/18  5:20 PM  Result Value Ref Range   WBC 11.2 (H) 4.0 - 10.5 K/uL   RBC 4.26 4.22  - 5.81 MIL/uL   Hemoglobin 13.9 13.0 - 17.0 g/dL   HCT 43.3 39.0 - 52.0 %   MCV 101.6 (H) 78.0 - 100.0 fL   MCH 32.6 26.0 - 34.0 pg   MCHC 32.1 30.0 - 36.0 g/dL   RDW 13.3 11.5 - 15.5 %   Platelets 251 150 - 400 K/uL   Neutrophils Relative % 72 %   Neutro Abs 8.1 (H) 1.7 - 7.7 K/uL   Lymphocytes Relative 15 %   Lymphs Abs 1.7 0.7 - 4.0 K/uL   Monocytes Relative 11 %   Monocytes Absolute 1.2 (H) 0.1 - 1.0 K/uL   Eosinophils Relative 1 %   Eosinophils Absolute 0.2 0.0 - 0.7 K/uL   Basophils Relative 1 %   Basophils Absolute 0.1 0.0 - 0.1 K/uL   Immature Granulocytes 0 %   Abs Immature Granulocytes 0.0 0.0 - 0.1 K/uL    Comment: Performed at [facility], [address], [place], [place]  [address]  Comprehensive metabolic panel     Status: Abnormal   Collection Time: 06/05/18  5:20 PM  Result Value Ref Range   Sodium 138 135 - 145 mmol/L   Potassium 4.3 3.5 - 5.1 mmol/L    Comment: HEMOLYSIS AT THIS LEVEL MAY AFFECT RESULT   Chloride 104 98 - 111 mmol/L   CO2 21 (L) 22 - 32 mmol/L   Glucose, Bld 87 70 - 99 mg/dL   BUN 18 8 - 23 mg/dL   Creatinine, Ser 1.36 (H) 0.61 - 1.24 mg/dL   Calcium 9.2 8.9 - 10.3 mg/dL   Total Protein 7.1 6.5 - 8.1 g/dL   Albumin 3.9 3.5 - 5.0 g/dL   AST 29 15 - 41 U/L   ALT 21 0 - 44 U/L   Alkaline Phosphatase 68 38 - 126 U/L   Total Bilirubin 1.0 0.3 - 1.2 mg/dL   GFR calc non Af Amer 50 (L) >60 mL/min   GFR calc Af Amer 58 (L) >60 mL/min    Comment: (NOTE) The eGFR has been calculated using the CKD EPI equation. This calculation has not been validated in all clinical situations. eGFR's persistently <60 mL/min signify possible Chronic Kidney Disease.    Anion gap 13 5 - 15    Comment: Performed at [facility] [address] [place], [place] [address]  Lipase, blood     Status: None   Collection Time: 06/05/18  5:20 PM  Result Value Ref Range   Lipase 44 11 - 51 U/L    Comment: Performed at [facility] [address], [place], [place] [address]  Urinalysis, Routine w reflex microscopic     Status: Abnormal   Collection Time: 06/05/18  5:21 PM  Result Value Ref Range   Color, Urine YELLOW YELLOW   APPearance CLEAR CLEAR   Specific Gravity, Urine 1.011 1.005 - 1.030   pH 5.0 5.0 - 8.0   Glucose, UA NEGATIVE NEGATIVE mg/dL   Hgb urine dipstick SMALL (A) NEGATIVE   Bilirubin Urine NEGATIVE NEGATIVE   Ketones, ur NEGATIVE NEGATIVE mg/dL   Protein, ur NEGATIVE NEGATIVE mg/dL   Nitrite NEGATIVE NEGATIVE   Leukocytes, UA NEGATIVE NEGATIVE   RBC / HPF 0-5 0 - 5 RBC/hpf   WBC, UA 0-5 0 - 5 WBC/hpf   Bacteria, UA RARE (A) NONE SEEN   Mucus PRESENT    Hyaline Casts, UA PRESENT     Comment: Performed at [facility] [address] [place], [place] [address]    Ct Abdomen Pelvis W Contrast  Result Date: 06/05/2018 CLINICAL DATA:  Incarcerated inguinal hernia. Significant constant pain for the last 3 days. History of prior hernia repair approximately 40 years ago. EXAM: CT ABDOMEN AND PELVIS WITH CONTRAST TECHNIQUE: Multidetector CT imaging of the abdomen and pelvis was performed using the standard protocol following bolus administration of intravenous contrast. CONTRAST:  154m ISOVUE-300 IOPAMIDOL (ISOVUE-300) INJECTION 61% COMPARISON:  None. FINDINGS: Lower chest: No acute abnormality. Hepatobiliary: Multiple gallstones. No evidence of acute cholecystitis. No focal liver abnormality. No bile duct dilatation. Pancreas: Unremarkable. No pancreatic ductal dilatation or surrounding inflammatory changes. Spleen: Normal in size without focal abnormality. Adrenals/Urinary Tract: Bilateral renal cysts. Kidneys otherwise unremarkable without suspicious mass, stone or hydronephrosis. No ureteral or bladder calculi identified. Bladder is decompressed. Stomach/Bowel: LEFT inguinal hernia which contains the lower descending colon and upper sigmoid colon. There is at least mild thickening of the walls of the colon within  the hernia and there is fluid stranding/inflammation within the LEFT inguinal hernia sac. More proximal colon is not distended, therefore, no evidence of associated bowel obstruction. There is diverticulosis throughout the sigmoid and descending colon. Stomach appears normal, partially decompressed. No small bowel wall thickening or evidence of small bowel inflammation. Appendix is normal. Vascular/Lymphatic: Extensive atherosclerosis of the abdominal aorta. 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 There is milder aneurysmal dilatation of the proximal RIGHT common iliac artery, measuring 1.9 cm diameter. No acute appearing vascular abnormality. No enlarged lymph nodes seen in the abdomen or pelvis. Reproductive: Prostate is unremarkable. Other: No abscess collection identified. No free intraperitoneal air. Musculoskeletal: No acute or suspicious osseous finding. Degenerative changes are seen throughout the thoracolumbar spine, mild to moderate in degree. IMPRESSION: 1. LEFT inguinal hernia contains a portion of the lower descending colon and/or upper sigmoid colon. There is inflammation/fluid stranding within the LEFT inguinal hernia and at least mild thickening of the walls of the involved segment of colon. This inflammatory change may indicate some degree of intermittent incarceration, however, there is no current evidence of associated bowel obstruction (the more proximal portion of the colon is not distended). Alternatively, findings could be related to an acute diverticulitis of the involved segment of colon, as there is extensive diverticulosis throughout the descending and sigmoid colon. 2. 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 Recommend followup by ultrasound in 3 years.  This recommendation follows ACR consensus guidelines: White Paper of the ACR Incidental Findings Committee II on Vascular Findings. J Am Coll Radiol 2013; 73:532-992 3. **An incidental finding of potential clinical significance has been found. 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 Recommend nonemergent vascular surgery consultation for further workup and/or follow-up considerations.** 4. Cholelithiasis without evidence of acute cholecystitis. 5. Aortic atherosclerosis. 6. Additional chronic/incidental findings detailed above. Electronically Signed   By: Franki Cabot M.D.   On: 06/05/2018 21:11    Review of Systems  Constitutional: Negative for weight loss.  HENT: Negative for nosebleeds.   Eyes: Negative for blurred vision.  Respiratory: Positive for cough and shortness of breath.   Cardiovascular: Positive for claudication. Negative for chest pain, palpitations, orthopnea and PND.       Denies DOE  Gastrointestinal: Negative for blood in stool, melena, nausea and vomiting.  Genitourinary: Negative for dysuria and hematuria.  Musculoskeletal: Negative.   Skin: Negative for itching and rash.  Neurological: Negative for dizziness, focal weakness, seizures, loss of consciousness and headaches.       Denies TIAs, amaurosis fugax  Endo/Heme/Allergies: Does not bruise/bleed easily.  Psychiatric/Behavioral: The patient is not nervous/anxious.    Blood pressure (!) 141/72, pulse 64, temperature 98.3 F (36.8 C), temperature source Oral, resp. rate 17, SpO2 98 %. Physical Exam  Vitals reviewed. Constitutional: He is oriented to person, place, and time. He appears well-developed and well-nourished. No distress.  Older than stated age  HENT:  Head: Normocephalic and atraumatic.  Right Ear: External ear normal.  Left Ear: External ear normal.  Eyes:  Conjunctivae are normal. No scleral icterus.  Neck: Normal range of motion. Neck supple. No tracheal deviation present. No thyromegaly present.  Cardiovascular: Normal rate, regular rhythm and normal  heart sounds.  Diminished peripheral pulses  Respiratory: Effort normal. No stridor. No respiratory distress. He has wheezes (mild exp wheeze).  Some audible upper airway wheezing  GI: Soft. He exhibits no distension. There is no rebound and no guarding. A hernia is present. Hernia confirmed positive in the left inguinal area.  Genitourinary:  Genitourinary Comments: Obvious left groin bulge. TTP. No rebound/peritonitis. No skin color change  Musculoskeletal: He exhibits no edema or tenderness.  Lymphadenopathy:    He has no cervical adenopathy.  Neurological: He is alert and oriented to person, place, and time. He exhibits normal muscle tone. GCS eye subscore is 4. GCS verbal subscore is 5. GCS motor subscore is 6.  Skin: Skin is warm and dry. No rash noted. He is not diaphoretic. No erythema. No pallor.  Psychiatric: He has a normal mood and affect. His behavior is normal. Judgment and thought content normal.    Assessment/Plan: Chronically incarcerated recurrent left inguinal hernia Coronary artery disease COPD Hypertension Possible claudication History of cardiomyopathy Lower abdominal aortic aneurysm Bilateral common iliac aneurysms  I do not believe this represents diverticulitis in the setting of incarcerated hernia.  I think this is primarily just a chronically incarcerated inguinal hernia that is now symptomatic.  There is no radiological evidence of strangulation or bowel obstruction.  There is no physical exam evidence of strangulation.  He is nontoxic-appearing therefore I do not believe he needs urgent surgical intervention  Recommend admission to medicine service Cardiology consult Pulmonology consult Hold Plavix Can have a full liquid diet Agree with empiric antibiotics  with a low probability this is actually diverticulitis  I discussed that he has a chronically incarcerated inguinal hernia that is now symptomatic and I believe he needs surgical intervention during this admission.  I discussed that we would need to have cardiology and pulmonary weigh in to make sure there is nothing else we can do to optimize him prior to surgery.  He is not too sure if he wants to have surgery.  I advised him that in my opinion that this is probably only going to get worse and not better and that he will risk strangulation if he does not have it repaired during this admission.   He said he will think about it  He will need outpt f/u with vascular surgery.   Steve Durham. Redmond Pulling, MD, FACS General, Bariatric, & Minimally Invasive Surgery [facility]   Greer Pickerel 06/05/2018, 11:58 PM

## 2018-06-05 NOTE — Consult Note (Signed)
Reason for Consult:incarcerated left inguinal  Referring Physician: Dr Francesca Oman R Charter is an 72 y.o. male.  HPI: 72 year old male with coronary artery disease status post CABG and multiple stents, COPD, ongoing tobacco abuse, hypertension, history of ischemic cardiomyopathy was sent to the emergency room by his primary care physician for concerns of incarcerated left inguinal hernia.  He states that he had a prior inguinal hernia repair many years ago.  He states about 6 months ago he started noticing a recurrent bulge in the left groin.  It is constant.  It does not come and go anymore.  The bulge has been present for many months causing some intermittent discomfort however a few days ago he started having fairly constant severe discomfort in his left groin.  He also reports a change in his bowel movement consistency.  He states normally has 2-3 good bowel movements per day but the last few days he just has had small bowel movements.  He reports lots of flatus.  He denies any nausea or vomiting.  He denies any fever or chills.  He smokes about 1 pack/day.  He is not terribly interested in stopping smoking.  He does endorse what sounds like claudication.  Denies rest pain, chest pain, TIAs or angina  On Plavix  Past Medical History:  Diagnosis Date  . Abnormal CT scan, kidney    07/2012 - multiple small cysts  . Aortic stenosis    a. Mod AS/AI by cath 07/2012.;  b.  Echo (07/31/13) is: EF 55-60%, mild aortic stenosis (mean gradient 13);  c. Echo (5/15):  EF 40-45%, mild AS (mean 12 mmHg), mod AI  . Back pain   . Carotid artery occlusion    a. Dopplers 07/2012: no significant high grade obstruction.  . Cholelithiasis    Seen on CT 07/2012  . Coronary artery disease    a. CABG 10/1991. b. cath 07/2012 with prog dsz, turned down for re-do CABG - for med rx for now.; c. NSTEMI/PCI: DES to mLAD ext into IMA graft;  d. Inf STEMI (5/15):  LM 60-70% then 99% before LAD, pLAD occl, pCFX stent  80+% ISR, oRCA 99% then occl (L-R collats to dRCA), L-LAD ok with patent stent, S-CFX occl (old), S-RCA occl (old); PCI: LM ext into CFX with Xience Alpine DES  . Heart failure (HCC) systolic  . Hyperlipidemia   . Hyperlipoproteinemia   . Hypertension   . Ischemic cardiomyopathy    a. 2D ECHO: 08/02/2014; EF 45%; severe hypokinesis base/mid-inferolat segments and severe hypokinesis base inferior segment. Mild LVH, mild AS (may be underestimated due to LV dysfunction).  Mean gradient (S): 14 mm Hg. Peak gradient (S): 25 mm Hg. VTI ratio of LVOT to aortic valve: 0.37. Mod LA dilation. Mild RV systolic dysfxn     Past Surgical History:  Procedure Laterality Date  . CARDIAC CATHETERIZATION  04/03/99  . CARDIAC CATHETERIZATION N/A 03/20/2015   Procedure: Left Heart Cath and Cors/Grafts Angiography;  Surgeon: Jettie Booze, MD;  Location: [place] [facility];  Service: Cardiovascular;  Laterality: N/A;  . CARDIAC CATHETERIZATION N/A 04/28/2016   Procedure: Right/Left Heart Cath and Coronary/Graft Angiography;  Surgeon: Burnell Blanks, MD;  Location: [facility];  Service: Cardiovascular;  Laterality: N/A;  . CARPAL TUNNEL RELEASE  2007   Excision mass dorsal left wrist  . CORONARY ARTERY BYPASS GRAFT  1993  . FASCIOTOMY Right 07/30/2013   Procedure: OPEN FASCIOTOMY RIGHT RING FINGER & RIGHT SMALL FINGER  MULTIPLE LEVELS;  Surgeon: Wynonia Sours, MD;  Location: [facility];  Service: Orthopedics;  Laterality: Right;  . HERNIA REPAIR  1988  . LEFT HEART CATHETERIZATION WITH CORONARY ANGIOGRAM N/A 02/28/2014   Procedure: LEFT HEART CATHETERIZATION WITH CORONARY ANGIOGRAM;  Surgeon: Troy Sine, MD;  Location: [facility] C[facility] LAB;  Service: Cardiovascular;  Laterality: N/A;  . LEFT HEART CATHETERIZATION WITH CORONARY/GRAFT ANGIOGRAM N/A 08/10/2012   Procedure: LEFT HEART CATHETERIZATION WITH Beatrix Fetters;  Surgeon: Hillary Bow, MD;  Location: [facility] CATH LAB;   Service: Cardiovascular;  Laterality: N/A;  . LEFT HEART CATHETERIZATION WITH CORONARY/GRAFT ANGIOGRAM N/A 08/01/2014   Procedure: LEFT HEART CATHETERIZATION WITH Beatrix Fetters;  Surgeon: Leonie Man, MD;  Location: [facility] CATH LAB;  Service: Cardiovascular;  Laterality: N/A;  . LEFT HEART CATHETERIZATION WITH CORONARY/GRAFT ANGIOGRAM N/A 02/12/2015   Procedure: LEFT HEART CATHETERIZATION WITH Beatrix Fetters;  Surgeon: Burnell Blanks, MD;  Location: [facility] CATH LAB;  Service: Cardiovascular;  Laterality: N/A;  . PERCUTANEOUS CORONARY STENT INTERVENTION (PCI-S)  07/31/2013   Procedure: PERCUTANEOUS CORONARY STENT INTERVENTION (PCI-S);  Surgeon: Burnell Blanks, MD;  Location: [facility] CATH LAB;  Service: Cardiovascular;;  LIMA to LAD at the anastomosis with aortic root shot   . RIGHT/LEFT HEART CATH AND CORONARY/GRAFT ANGIOGRAPHY N/A 04/04/2018   Procedure: RIGHT/LEFT HEART CATH AND CORONARY/GRAFT ANGIOGRAPHY;  Surgeon: Martinique, Peter M, MD;  Location: [place] [facility];  Service: Cardiovascular;  Laterality: N/A;    Family History  Problem Relation Age of Onset  . Heart attack Mother        died @ 44.  Marland Kitchen Hypertension Mother   . Hypertension Father   . Hypertension Sister   . Emphysema Brother   . Hypertension Brother   . Allergies Sister   . Stroke Neg Hx     Social History:  reports that he has been smoking cigarettes. He has a 57.00 pack-year smoking history. He has never used smokeless tobacco. He reports that he drinks about 5.0 standard drinks of alcohol per week. He reports that he does not use drugs.  Allergies: No Known Allergies  Medications: I have reviewed the patient's current medications.  Results for orders placed or performed during the hospital encounter of 06/05/18 (from the past 48 hour(s))  CBC with Differential     Status: Abnormal   Collection Time: 06/05/18  5:20 PM  Result Value Ref Range   WBC 11.2 (H) 4.0 - 10.5 K/uL   RBC 4.26 4.22  - 5.81 MIL/uL   Hemoglobin 13.9 13.0 - 17.0 g/dL   HCT 43.3 39.0 - 52.0 %   MCV 101.6 (H) 78.0 - 100.0 fL   MCH 32.6 26.0 - 34.0 pg   MCHC 32.1 30.0 - 36.0 g/dL   RDW 13.3 11.5 - 15.5 %   Platelets 251 150 - 400 K/uL   Neutrophils Relative % 72 %   Neutro Abs 8.1 (H) 1.7 - 7.7 K/uL   Lymphocytes Relative 15 %   Lymphs Abs 1.7 0.7 - 4.0 K/uL   Monocytes Relative 11 %   Monocytes Absolute 1.2 (H) 0.1 - 1.0 K/uL   Eosinophils Relative 1 %   Eosinophils Absolute 0.2 0.0 - 0.7 K/uL   Basophils Relative 1 %   Basophils Absolute 0.1 0.0 - 0.1 K/uL   Immature Granulocytes 0 %   Abs Immature Granulocytes 0.0 0.0 - 0.1 K/uL    Comment: Performed at [facility], [address] [place], [place]  [address]  Comprehensive metabolic panel     Status: Abnormal   Collection Time: 06/05/18  5:20 PM  Result Value Ref Range   Sodium 138 135 - 145 mmol/L   Potassium 4.3 3.5 - 5.1 mmol/L    Comment: HEMOLYSIS AT THIS LEVEL MAY AFFECT RESULT   Chloride 104 98 - 111 mmol/L   CO2 21 (L) 22 - 32 mmol/L   Glucose, Bld 87 70 - 99 mg/dL   BUN 18 8 - 23 mg/dL   Creatinine, Ser 1.36 (H) 0.61 - 1.24 mg/dL   Calcium 9.2 8.9 - 10.3 mg/dL   Total Protein 7.1 6.5 - 8.1 g/dL   Albumin 3.9 3.5 - 5.0 g/dL   AST 29 15 - 41 U/L   ALT 21 0 - 44 U/L   Alkaline Phosphatase 68 38 - 126 U/L   Total Bilirubin 1.0 0.3 - 1.2 mg/dL   GFR calc non Af Amer 50 (L) >60 mL/min   GFR calc Af Amer 58 (L) >60 mL/min    Comment: (NOTE) The eGFR has been calculated using the CKD EPI equation. This calculation has not been validated in all clinical situations. eGFR's persistently <60 mL/min signify possible Chronic Kidney Disease.    Anion gap 13 5 - 15    Comment: Performed at [facility] [address], [place], [place] [address]  Lipase, blood     Status: None   Collection Time: 06/05/18  5:20 PM  Result Value Ref Range   Lipase 44 11 - 51 U/L    Comment: Performed at [facility] [address] [place], [place] [address]  Urinalysis, Routine w reflex microscopic     Status: Abnormal   Collection Time: 06/05/18  5:21 PM  Result Value Ref Range   Color, Urine YELLOW YELLOW   APPearance CLEAR CLEAR   Specific Gravity, Urine 1.011 1.005 - 1.030   pH 5.0 5.0 - 8.0   Glucose, UA NEGATIVE NEGATIVE mg/dL   Hgb urine dipstick SMALL (A) NEGATIVE   Bilirubin Urine NEGATIVE NEGATIVE   Ketones, ur NEGATIVE NEGATIVE mg/dL   Protein, ur NEGATIVE NEGATIVE mg/dL   Nitrite NEGATIVE NEGATIVE   Leukocytes, UA NEGATIVE NEGATIVE   RBC / HPF 0-5 0 - 5 RBC/hpf   WBC, UA 0-5 0 - 5 WBC/hpf   Bacteria, UA RARE (A) NONE SEEN   Mucus PRESENT    Hyaline Casts, UA PRESENT     Comment: Performed at [facility] [address], [place],  [address]    Ct Abdomen Pelvis W Contrast  Result Date: 06/05/2018 CLINICAL DATA:  Incarcerated inguinal hernia. Significant constant pain for the last 3 days. History of prior hernia repair approximately 40 years ago. EXAM: CT ABDOMEN AND PELVIS WITH CONTRAST TECHNIQUE: Multidetector CT imaging of the abdomen and pelvis was performed using the standard protocol following bolus administration of intravenous contrast. CONTRAST:  164m ISOVUE-300 IOPAMIDOL (ISOVUE-300) INJECTION 61% COMPARISON:  None. FINDINGS: Lower chest: No acute abnormality. Hepatobiliary: Multiple gallstones. No evidence of acute cholecystitis. No focal liver abnormality. No bile duct dilatation. Pancreas: Unremarkable. No pancreatic ductal dilatation or surrounding inflammatory changes. Spleen: Normal in size without focal abnormality. Adrenals/Urinary Tract: Bilateral renal cysts. Kidneys otherwise unremarkable without suspicious mass, stone or hydronephrosis. No ureteral or bladder calculi identified. Bladder is decompressed. Stomach/Bowel: LEFT inguinal hernia which contains the lower descending colon and upper sigmoid colon. There is at least mild thickening of the walls of the colon within  the hernia and there is fluid stranding/inflammation within the LEFT inguinal hernia sac. More proximal colon is not distended, therefore, no evidence of associated bowel obstruction. There is diverticulosis throughout the sigmoid and descending colon. Stomach appears normal, partially decompressed. No small bowel wall thickening or evidence of small bowel inflammation. Appendix is normal. Vascular/Lymphatic: Extensive atherosclerosis of the abdominal aorta. 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 There is milder aneurysmal dilatation of the proximal RIGHT common iliac artery, measuring 1.9 cm diameter. No acute appearing vascular abnormality. No enlarged lymph nodes seen in the abdomen or pelvis. Reproductive: Prostate is unremarkable. Other: No abscess collection identified. No free intraperitoneal air. Musculoskeletal: No acute or suspicious osseous finding. Degenerative changes are seen throughout the thoracolumbar spine, mild to moderate in degree. IMPRESSION: 1. LEFT inguinal hernia contains a portion of the lower descending colon and/or upper sigmoid colon. There is inflammation/fluid stranding within the LEFT inguinal hernia and at least mild thickening of the walls of the involved segment of colon. This inflammatory change may indicate some degree of intermittent incarceration, however, there is no current evidence of associated bowel obstruction (the more proximal portion of the colon is not distended). Alternatively, findings could be related to an acute diverticulitis of the involved segment of colon, as there is extensive diverticulosis throughout the descending and sigmoid colon. 2. 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 Recommend followup by ultrasound in 3 years.  This recommendation follows ACR consensus guidelines: White Paper of the ACR Incidental Findings Committee II on Vascular Findings. J Am Coll Radiol 2013; 74:163-845 3. **An incidental finding of potential clinical significance has been found. 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 Recommend nonemergent vascular surgery consultation for further workup and/or follow-up considerations.** 4. Cholelithiasis without evidence of acute cholecystitis. 5. Aortic atherosclerosis. 6. Additional chronic/incidental findings detailed above. Electronically Signed   By: Franki Cabot M.D.   On: 06/05/2018 21:11    Review of Systems  Constitutional: Negative for weight loss.  HENT: Negative for nosebleeds.   Eyes: Negative for blurred vision.  Respiratory: Positive for cough and shortness of breath.   Cardiovascular: Positive for claudication. Negative for chest pain, palpitations, orthopnea and PND.       Denies DOE  Gastrointestinal: Negative for blood in stool, melena, nausea and vomiting.  Genitourinary: Negative for dysuria and hematuria.  Musculoskeletal: Negative.   Skin: Negative for itching and rash.  Neurological: Negative for dizziness, focal weakness, seizures, loss of consciousness and headaches.       Denies TIAs, amaurosis fugax  Endo/Heme/Allergies: Does not bruise/bleed easily.  Psychiatric/Behavioral: The patient is not nervous/anxious.    Blood pressure (!) 141/72, pulse 64, temperature 98.3 F (36.8 C), temperature source Oral, resp. rate 17, SpO2 98 %. Physical Exam  Vitals reviewed. Constitutional: He is oriented to person, place, and time. He appears well-developed and well-nourished. No distress.  Older than stated age  HENT:  Head: Normocephalic and atraumatic.  Right Ear: External ear normal.  Left Ear: External ear normal.  Eyes:  Conjunctivae are normal. No scleral icterus.  Neck: Normal range of motion. Neck supple. No tracheal deviation present. No thyromegaly present.  Cardiovascular: Normal rate, regular rhythm and normal  heart sounds.  Diminished peripheral pulses  Respiratory: Effort normal. No stridor. No respiratory distress. He has wheezes (mild exp wheeze).  Some audible upper airway wheezing  GI: Soft. He exhibits no distension. There is no rebound and no guarding. A hernia is present. Hernia confirmed positive in the left inguinal area.  Genitourinary:  Genitourinary Comments: Obvious left groin bulge. TTP. No rebound/peritonitis. No skin color change  Musculoskeletal: He exhibits no edema or tenderness.  Lymphadenopathy:    He has no cervical adenopathy.  Neurological: He is alert and oriented to person, place, and time. He exhibits normal muscle tone. GCS eye subscore is 4. GCS verbal subscore is 5. GCS motor subscore is 6.  Skin: Skin is warm and dry. No rash noted. He is not diaphoretic. No erythema. No pallor.  Psychiatric: He has a normal mood and affect. His behavior is normal. Judgment and thought content normal.    Assessment/Plan: Chronically incarcerated recurrent left inguinal hernia Coronary artery disease COPD Hypertension Possible claudication History of cardiomyopathy Lower abdominal aortic aneurysm Bilateral common iliac aneurysms  I do not believe this represents diverticulitis in the setting of incarcerated hernia.  I think this is primarily just a chronically incarcerated inguinal hernia that is now symptomatic.  There is no radiological evidence of strangulation or bowel obstruction.  There is no physical exam evidence of strangulation.  He is nontoxic-appearing therefore I do not believe he needs urgent surgical intervention  Recommend admission to medicine service Cardiology consult Pulmonology consult Hold Plavix Can have a full liquid diet Agree with empiric antibiotics  with a low probability this is actually diverticulitis  I discussed that he has a chronically incarcerated inguinal hernia that is now symptomatic and I believe he needs surgical intervention during this admission.  I discussed that we would need to have cardiology and pulmonary weigh in to make sure there is nothing else we can do to optimize him prior to surgery.  He is not too sure if he wants to have surgery.  I advised him that in my opinion that this is probably only going to get worse and not better and that he will risk strangulation if he does not have it repaired during this admission.   He said he will think about it  He will need outpt f/u with vascular surgery.   Leighton Ruff. Redmond Pulling, MD, FACS General, Bariatric, & Minimally Invasive Surgery [facility]   Greer Pickerel 06/05/2018, 11:58 PM

## 2018-06-05 NOTE — H&P (Signed)
History and Physical    Steve Durham OZH:[id number] DOB: 31-Oct-1945 DOA: 06/05/2018  Referring MD/NP/PA: Dr Sabra Heck  PCP: Nona Dell, Corene Cornea, MD   Patient coming from: Home  Chief Complaint: left sided abdominal pain  HPI: Steve Durham is a 72 y.o. male with medical history significant of chronic left inguinal hernia, ongoing tobacco abuse, hypertension, coronary artery disease with previous coronary artery bypass grafting and stenting, COPD, peripheral vascular disease who presented to the ER with abdominal pain nausea and vomiting.  He has left sided inguinal hernia that is incarcerated.  Also associated claudication of his right lower extremity with walking.  Patient has continued to be nauseated.  He has had this chronic incarcerated inguinal hernia that is now more painful than what it was.  He has previously been offered surgery but did not get it.  Patient is evaluated in the ER now found to have incarcerated inguinal hernia on the left but also diverticulitis possibly.  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  He has been evaluated by general surgery and recommendation is to be admitted to medicine to prepare for possible inguinal herniorrhaphy.  ED Course: Patient is afebrile with a temperature 98.3 blood pressure is 155/68 pulse 63 respiratory rate of 20 oxygen sat 95% room air.  He has a white count of 11.2 thousand hemoglobin 13.9 and platelets of 251.  Sodium 138 potassium 4.3 chloride 104 CO2 21 with creatinine of 1.36.  Urinalysis is negative.  CT abdomen pelvis showed incarcerated left inguinal hernia with possible diverticulitis. Review of Systems: As per HPI otherwise 10 point review of systems negative.    Past Medical History:  Diagnosis Date  . Abnormal CT scan, kidney    07/2012 - multiple small cysts  . Aortic stenosis    a. Mod AS/AI by cath 07/2012.;  b.  Echo (07/31/13) is: EF  55-60%, mild aortic stenosis (mean gradient 13);  c. Echo (5/15):  EF 40-45%, mild AS (mean 12 mmHg), mod AI  . Back pain   . Carotid artery occlusion    a. Dopplers 07/2012: no significant high grade obstruction.  . Cholelithiasis    Seen on CT 07/2012  . Coronary artery disease    a. CABG 10/1991. b. cath 07/2012 with prog dsz, turned down for re-do CABG - for med rx for now.; c. NSTEMI/PCI: DES to mLAD ext into IMA graft;  d. Inf STEMI (5/15):  LM 60-70% then 99% before LAD, pLAD occl, pCFX stent 80+% ISR, oRCA 99% then occl (L-R collats to dRCA), L-LAD ok with patent stent, S-CFX occl (old), S-RCA occl (old); PCI: LM ext into CFX with Xience Alpine DES  . Heart failure (HCC) systolic  . Hyperlipidemia   . Hyperlipoproteinemia   . Hypertension   . Ischemic cardiomyopathy    a. 2D ECHO: 08/02/2014; EF 45%; severe hypokinesis base/mid-inferolat segments and severe hypokinesis base inferior segment. Mild LVH, mild AS (may be underestimated due to LV dysfunction).  Mean gradient (S): 14 mm Hg. Peak gradient (S): 25 mm Hg. VTI ratio of LVOT to aortic valve: 0.37. Mod LA dilation. Mild RV systolic dysfxn     Past Surgical History:  Procedure Laterality Date  . CARDIAC CATHETERIZATION  04/03/99  . CARDIAC CATHETERIZATION N/A 03/20/2015   Procedure: Left Heart Cath and Cors/Grafts Angiography;  Surgeon: Jettie Booze, MD;  Location: [place] [facility];  Service: Cardiovascular;  Laterality: N/A;  . CARDIAC CATHETERIZATION N/A 04/28/2016   Procedure: Right/Left Heart Cath and Coronary/Graft Angiography;  Surgeon: Burnell Blanks, MD;  Location: [place] [facility];  Service: Cardiovascular;  Laterality: N/A;  . CARPAL TUNNEL RELEASE  2007   Excision mass dorsal left wrist  . CORONARY ARTERY BYPASS GRAFT  1993  . FASCIOTOMY Right 07/30/2013   Procedure: OPEN FASCIOTOMY RIGHT RING FINGER & RIGHT SMALL FINGER MULTIPLE LEVELS;  Surgeon: Wynonia Sours, MD;  Location: [place];   Service: Orthopedics;  Laterality: Right;  . HERNIA REPAIR  1988  . LEFT HEART CATHETERIZATION WITH CORONARY ANGIOGRAM N/A 02/28/2014   Procedure: LEFT HEART CATHETERIZATION WITH CORONARY ANGIOGRAM;  Surgeon: Troy Sine, MD;  Location: [facility] CATH LAB;  Service: Cardiovascular;  Laterality: N/A;  . LEFT HEART CATHETERIZATION WITH CORONARY/GRAFT ANGIOGRAM N/A 08/10/2012   Procedure: LEFT HEART CATHETERIZATION WITH Beatrix Fetters;  Surgeon: Hillary Bow, MD;  Location: [facility] CATH LAB;  Service: Cardiovascular;  Laterality: N/A;  . LEFT HEART CATHETERIZATION WITH CORONARY/GRAFT ANGIOGRAM N/A 08/01/2014   Procedure: LEFT HEART CATHETERIZATION WITH Beatrix Fetters;  Surgeon: Leonie Man, MD;  Location: [facility] CATH LAB;  Service: Cardiovascular;  Laterality: N/A;  . LEFT HEART CATHETERIZATION WITH CORONARY/GRAFT ANGIOGRAM N/A 02/12/2015   Procedure: LEFT HEART CATHETERIZATION WITH Beatrix Fetters;  Surgeon: Burnell Blanks, MD;  Location: [facility] CATH LAB;  Service: Cardiovascular;  Laterality: N/A;  . PERCUTANEOUS CORONARY STENT INTERVENTION (PCI-S)  07/31/2013   Procedure: PERCUTANEOUS CORONARY STENT INTERVENTION (PCI-S);  Surgeon: Burnell Blanks, MD;  Location: [facility] CATH LAB;  Service: Cardiovascular;;  LIMA to LAD at the anastomosis with aortic root shot   . RIGHT/LEFT HEART CATH AND CORONARY/GRAFT ANGIOGRAPHY N/A 04/04/2018   Procedure: RIGHT/LEFT HEART CATH AND CORONARY/GRAFT ANGIOGRAPHY;  Surgeon: Martinique, Peter M, MD;  Location: [place] [facility];  Service: Cardiovascular;  Laterality: N/A;     reports that he has been smoking cigarettes. He has a 57.00 pack-year smoking history. He has never used smokeless tobacco. He reports that he drinks about 5.0 standard drinks of alcohol per week. He reports that he does not use drugs.  No Known Allergies  Family History  Problem Relation Age of Onset  . Heart attack Mother        died @ 52.  Marland Kitchen Hypertension Mother     . Hypertension Father   . Hypertension Sister   . Emphysema Brother   . Hypertension Brother   . Allergies Sister   . Stroke Neg Hx     Prior to Admission medications   Medication Sig Start Date End Date Taking? Authorizing Provider  allopurinol (ZYLOPRIM) 100 MG tablet Take 100 mg by mouth daily. 10/17/17  Yes [provider]  amLODipine (NORVASC) 5 MG tablet TAKE 1 TABLET BY MOUTH DAILY Patient taking differently: Take 5 mg by mouth daily.  05/05/18  Yes Burnell Blanks, MD  atorvastatin (LIPITOR) 80 MG tablet TAKE ONE (1) TABLET EACH DAY Patient taking differently: Take 80 mg by mouth daily at 6 PM.  06/02/17  Yes McAlhany, Annita Brod, MD  bisoprolol (ZEBETA) 5 MG tablet TAKE 1/2 TABLET BY MOUTH TWICE DAILY Patient taking differently: Take 2.5 mg by mouth 2 (two) times daily.  05/15/18  Yes Burnell Blanks, MD  clopidogrel (PLAVIX) 75 MG tablet TAKE 1 TABLET BY MOUTH DAILY Patient taking differently: Take 75 mg by mouth daily.  05/12/18  Yes Burnell Blanks, MD  fexofenadine (ALLEGRA) 180 MG  tablet Take 180 mg by mouth daily as needed for allergies.    Yes [provider]  furosemide (LASIX) 40 MG tablet TAKE ONE TABLET BY MOUTH TWICE DAILY Patient taking differently: Take 40 mg by mouth 2 (two) times daily.  04/19/18  Yes Burnell Blanks, MD  ipratropium-albuterol (DUONEB) 0.5-2.5 (3) MG/3ML SOLN Take 3 mLs by nebulization every 6 (six) hours as needed (for shortness of breath or wheezing).    Yes [provider]  isosorbide mononitrate (IMDUR) 60 MG 24 hr tablet Take 1 tablet (60 mg total) by mouth 2 (two) times daily. 09/23/17  Yes Burnell Blanks, MD  nitroGLYCERIN (NITROSTAT) 0.4 MG SL tablet Place 0.4 mg under the tongue every 5 (five) minutes as needed for chest pain.   Yes [provider]  pantoprazole (PROTONIX) 40 MG tablet TAKE ONE TABLET BY MOUTH ONCE DAILY Patient taking differently: Take 40 mg by  mouth daily.  08/08/17  Yes Burnell Blanks, MD  potassium chloride SA (K-DUR,KLOR-CON) 20 MEQ tablet TAKE ONE TABLET BY MOUTH ONCE DAILY Patient taking differently: Take 20 mEq by mouth daily.  05/05/18  Yes Burnell Blanks, MD  spironolactone (ALDACTONE) 25 MG tablet TAKE 1/2 TABLET BY MOUTH TWICE DAILY Patient taking differently: Take 12.5 mg by mouth 2 (two) times daily.  02/16/18  Yes Burnell Blanks, MD    Physical Exam: Vitals:   06/05/18 1853 06/05/18 2221 06/05/18 2222 06/05/18 2300  BP: (!) 132/95 107/71  137/64  Pulse: 68  69 63  Resp: 15  20 18   Temp:      TempSrc:      SpO2: 98%  99% 95%      Constitutional: NAD, calm, comfortable Vitals:   06/05/18 1853 06/05/18 2221 06/05/18 2222 06/05/18 2300  BP: (!) 132/95 107/71  137/64  Pulse: 68  69 63  Resp: 15  20 18   Temp:      TempSrc:      SpO2: 98%  99% 95%   Eyes: PERRL, lids and conjunctivae normal ENMT: Mucous membranes are moist. Posterior pharynx clear of any exudate or lesions.Normal dentition.  Neck: normal, supple, no masses, no thyromegaly Respiratory: clear to auscultation bilaterally, no wheezing, no crackles. Normal respiratory effort. No accessory muscle use.  Cardiovascular: Regular rate and rhythm, no murmurs / rubs / gallops. No extremity edema. 2+ pedal pulses. No carotid bruits.  Abdomen: Distended, diffuse tenderness, . No hepatosplenomegaly. Bowel sounds positive.  Musculoskeletal: no clubbing / cyanosis. No joint deformity upper and lower extremities. Good ROM, no contractures. Normal muscle tone.  Skin: no rashes, lesions, ulcers. No induration Neurologic: CN 2-12 grossly intact. Sensation intact, DTR normal. Strength 5/5 in all 4.  Psychiatric: Normal judgment and insight. Alert and oriented x 3. Normal mood.   Labs on Admission: I have personally reviewed following labs and imaging studies  CBC: Recent Labs  Lab 06/05/18 1720  WBC 11.2*  NEUTROABS 8.1*  HGB 13.9   HCT 43.3  MCV 101.6*  PLT 696   Basic Metabolic Panel: Recent Labs  Lab 06/05/18 1720  NA 138  K 4.3  CL 104  CO2 21*  GLUCOSE 87  BUN 18  CREATININE 1.36*  CALCIUM 9.2   GFR: Estimated Creatinine Clearance: 41.5 mL/min (A) (by C-G formula based on SCr of 1.36 mg/dL (H)). Liver Function Tests: Recent Labs  Lab 06/05/18 1720  AST 29  ALT 21  ALKPHOS 68  BILITOT 1.0  PROT 7.1  ALBUMIN 3.9  Recent Labs  Lab 06/05/18 1720  LIPASE 44   No results for input(s): AMMONIA in the last 168 hours. Coagulation Profile: No results for input(s): INR, PROTIME in the last 168 hours. Cardiac Enzymes: No results for input(s): CKTOTAL, CKMB, CKMBINDEX, TROPONINI in the last 168 hours. BNP (last 3 results) No results for input(s): PROBNP in the last 8760 hours. HbA1C: No results for input(s): HGBA1C in the last 72 hours. CBG: No results for input(s): GLUCAP in the last 168 hours. Lipid Profile: No results for input(s): CHOL, HDL, LDLCALC, TRIG, CHOLHDL, LDLDIRECT in the last 72 hours. Thyroid Function Tests: No results for input(s): TSH, T4TOTAL, FREET4, T3FREE, THYROIDAB in the last 72 hours. Anemia Panel: No results for input(s): VITAMINB12, FOLATE, FERRITIN, TIBC, IRON, RETICCTPCT in the last 72 hours. Urine analysis:    Component Value Date/Time   COLORURINE YELLOW 06/05/2018 1721   APPEARANCEUR CLEAR 06/05/2018 1721   LABSPEC 1.011 06/05/2018 1721   PHURINE 5.0 06/05/2018 1721   GLUCOSEU NEGATIVE 06/05/2018 1721   HGBUR SMALL (A) 06/05/2018 1721   BILIRUBINUR NEGATIVE 06/05/2018 1721   KETONESUR NEGATIVE 06/05/2018 1721   PROTEINUR NEGATIVE 06/05/2018 1721   NITRITE NEGATIVE 06/05/2018 1721   LEUKOCYTESUR NEGATIVE 06/05/2018 1721   Sepsis Labs: @LABRCNTIP (procalcitonin:4,lacticidven:4) )No results found for this or any previous visit (from the past 240 hour(s)).   Radiological Exams on Admission: Ct Abdomen Pelvis W Contrast  Result Date:  06/05/2018 CLINICAL DATA:  Incarcerated inguinal hernia. Significant constant pain for the last 3 days. History of prior hernia repair approximately 40 years ago. EXAM: CT ABDOMEN AND PELVIS WITH CONTRAST TECHNIQUE: Multidetector CT imaging of the abdomen and pelvis was performed using the standard protocol following bolus administration of intravenous contrast. CONTRAST:  161mL ISOVUE-300 IOPAMIDOL (ISOVUE-300) INJECTION 61% COMPARISON:  None. FINDINGS: Lower chest: No acute abnormality. Hepatobiliary: Multiple gallstones. No evidence of acute cholecystitis. No focal liver abnormality. No bile duct dilatation. Pancreas: Unremarkable. No pancreatic ductal dilatation or surrounding inflammatory changes. Spleen: Normal in size without focal abnormality. Adrenals/Urinary Tract: Bilateral renal cysts. Kidneys otherwise unremarkable without suspicious mass, stone or hydronephrosis. No ureteral or bladder calculi identified. Bladder is decompressed. Stomach/Bowel: LEFT inguinal hernia which contains the lower descending colon and upper sigmoid colon. There is at least mild thickening of the walls of the colon within the hernia and there is fluid stranding/inflammation within the LEFT inguinal hernia sac. More proximal colon is not distended, therefore, no evidence of associated bowel obstruction. There is diverticulosis throughout the sigmoid and descending colon. Stomach appears normal, partially decompressed. No small bowel wall thickening or evidence of small bowel inflammation. Appendix is normal. Vascular/Lymphatic: Extensive atherosclerosis of the abdominal aorta. 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 There is milder aneurysmal dilatation of the proximal RIGHT common iliac artery, measuring 1.9 cm diameter. No acute  appearing vascular abnormality. No enlarged lymph nodes seen in the abdomen or pelvis. Reproductive: Prostate is unremarkable. Other: No abscess collection identified. No free intraperitoneal air. Musculoskeletal: No acute or suspicious osseous finding. Degenerative changes are seen throughout the thoracolumbar spine, mild to moderate in degree. IMPRESSION: 1. LEFT inguinal hernia contains a portion of the lower descending colon and/or upper sigmoid colon. There is inflammation/fluid stranding within the LEFT inguinal hernia and at least mild thickening of the walls of the involved segment of colon. This inflammatory  change may indicate some degree of intermittent incarceration, however, there is no current evidence of associated bowel obstruction (the more proximal portion of the colon is not distended). Alternatively, findings could be related to an acute diverticulitis of the involved segment of colon, as there is extensive diverticulosis throughout the descending and sigmoid colon. 2. 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 Recommend followup by ultrasound in 3 years. This recommendation follows ACR consensus guidelines: White Paper of the ACR Incidental Findings Committee II on Vascular Findings. J Am Coll Radiol 2013; 60:454-098 3. **An incidental finding of potential clinical significance has been found. 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 Recommend nonemergent vascular surgery consultation for further workup and/or follow-up considerations.** 4. Cholelithiasis without evidence of acute cholecystitis. 5. Aortic atherosclerosis. 6. Additional chronic/incidental findings detailed above. Electronically Signed   By: Franki Cabot M.D.   On: 06/05/2018 21:11    Assessment/Plan Principal Problem:   Diverticulitis Active  Problems:   Essential hypertension   Coronary artery disease   Hyperlipidemia   Cigarette smoker   Ischemic cardiomyopathy with EF 40-45%   Inguinal hernia with incarceration    #1 incarcerated left inguinal hernia: Hernia is not easily reducible and is tender.  Associated diverticulitis within the hernia.  Patient most likely will require surgical reduction while in the hospital.  We will hold his Plavix.  Get cardiology as well as pulmonary consults in the morning for preoperative clearance.  Continue care according to surgery.  #2 acute diverticulitis: Associated with incarcerated hernia.  Empiric Cipro Flagyl.  Continue supportive measures.  #3 coronary artery disease: No evidence of acute coronary decompensation.  Patient will need cardiac clearance for any intended surgery.  #4 hyperlipidemia: Continue with statin.  #5 ongoing tobacco abuse: Counseling provided.  Patient will be placed on nicotine patch in the hospital  #6 ischemic cardiomyopathy: EF previously was 45 to 50%.  Patient will need cardiology input prior to any surgical procedure.  #7 peripheral vascular disease with hyperlipidemia: Continue statin.  We will hold Plavix for possible surgery.   DVT prophylaxis: Lovenox  Code Status: Full code  Family Communication: No family available  Disposition Plan: To be determined  Consults called: Dr. Redmond Pulling general surgery  Admission status: Inpatient  Severity of Illness: The appropriate patient status for this patient is INPATIENT. Inpatient status is judged to be reasonable and necessary in order to provide the required intensity of service to ensure the patient's safety. The patient's presenting symptoms, physical exam findings, and initial radiographic and laboratory data in the context of their chronic comorbidities is felt to place them at high risk for further clinical deterioration. Furthermore, it is not anticipated that the patient will be medically stable for  discharge from the hospital within 2 midnights of admission. The following factors support the patient status of inpatient.   " The patient's presenting symptoms include abdominal pain with distention. " The worrisome physical exam findings include left inguinal swelling with tenderness. " The initial radiographic and laboratory data are worrisome because of possible diverticulitis with incarcerated hernia. " The chronic co-morbidities include recurrent left inguinal hernia with coronary artery disease and ischemic cardiomyopathy.   * I certify that at the point of admission it is my clinical judgment that the patient will require inpatient hospital care spanning beyond 2 midnights from the point of admission due  to high intensity of service, high risk for further deterioration and high frequency of surveillance required.Barbette Merino MD [facility] Pager [phone]  If 7PM-7AM, please contact night-coverage [email or web address] Password [facility]  06/05/2018, 11:35 PM

## 2018-06-05 NOTE — ED Provider Notes (Signed)
Patient placed in Quick Look pathway, seen and evaluated   Chief Complaint: Left lower abdominal pain  HPI:   72 year old male presents with 3-4 days of intermittent, severe abdominal pain. He was seen by his PCP in [place] and advised to come to the ED. PMH of CAD, COPD. PCP was concerned for possible incarcerated hernia. He has had an inguinal hernia for months but it has worsened over the past couple days. No fever, chills, N/V. He has been passing a lot of gas, belching, and had constipation. He has remote prior hx of inguinal hernia repair.  ROS: +abdominal pain  Physical Exam:   Gen: No distress  Neuro: Awake and Alert  Skin: Warm    Focused Exam: Heart: regular rate and rhythm    Lungs: CTA    Abodmen: Soft, tender in LLQ   Initiation of care has begun. The patient has been counseled on the process, plan, and necessity for staying for the completion/evaluation, and the remainder of the medical screening examination    Recardo Evangelist, PA-C 06/05/18 1714    Davonna Belling, MD 06/05/18 [phone]

## 2018-06-05 NOTE — ED Triage Notes (Signed)
Pt to ER sent from PCP for incarcerated inguinal hernia to left side. Pt in NAD at this time. States has had hernia for 6 months but has significant constant pain over the last 3 days.

## 2018-06-06 ENCOUNTER — Encounter (HOSPITAL_COMMUNITY): Payer: Self-pay | Admitting: *Deleted

## 2018-06-06 DIAGNOSIS — Z01818 Encounter for other preprocedural examination: Secondary | ICD-10-CM

## 2018-06-06 DIAGNOSIS — I2510 Atherosclerotic heart disease of native coronary artery without angina pectoris: Secondary | ICD-10-CM

## 2018-06-06 LAB — COMPREHENSIVE METABOLIC PANEL
ALT: 19 U/L (ref 0–44)
AST: 21 U/L (ref 15–41)
Albumin: 3.4 g/dL — ABNORMAL LOW (ref 3.5–5.0)
Alkaline Phosphatase: 55 U/L (ref 38–126)
Anion gap: 7 (ref 5–15)
BUN: 16 mg/dL (ref 8–23)
CHLORIDE: 107 mmol/L (ref 98–111)
CO2: 25 mmol/L (ref 22–32)
Calcium: 8.6 mg/dL — ABNORMAL LOW (ref 8.9–10.3)
Creatinine, Ser: 1.38 mg/dL — ABNORMAL HIGH (ref 0.61–1.24)
GFR calc non Af Amer: 49 mL/min — ABNORMAL LOW (ref >60)
GFR, EST AFRICAN AMERICAN: 57 mL/min — AB (ref >60)
Glucose, Bld: 113 mg/dL — ABNORMAL HIGH (ref 70–99)
POTASSIUM: 3.5 mmol/L (ref 3.5–5.1)
SODIUM: 139 mmol/L (ref 135–145)
Total Bilirubin: 1.1 mg/dL (ref 0.3–1.2)
Total Protein: 6.3 g/dL — ABNORMAL LOW (ref 6.5–8.1)

## 2018-06-06 LAB — CBC
HEMATOCRIT: 41.1 % (ref 39.0–52.0)
HEMOGLOBIN: 13 g/dL (ref 13.0–17.0)
MCH: 32.1 pg (ref 26.0–34.0)
MCHC: 31.6 g/dL (ref 30.0–36.0)
MCV: 101.5 fL — AB (ref 78.0–100.0)
Platelets: 210 10*3/uL (ref 150–400)
RBC: 4.05 MIL/uL — AB (ref 4.22–5.81)
RDW: 13.2 % (ref 11.5–15.5)
WBC: 8.9 10*3/uL (ref 4.0–10.5)

## 2018-06-06 LAB — SURGICAL PCR SCREEN
MRSA, PCR: NEGATIVE
Staphylococcus aureus: POSITIVE — AB

## 2018-06-06 MED ORDER — HEPARIN SODIUM (PORCINE) 5000 UNIT/ML IJ SOLN
5000.0000 [IU] | Freq: Three times a day (TID) | INTRAMUSCULAR | Status: AC
  Administered 2018-06-06 (×2): 5000 [IU] via SUBCUTANEOUS
  Filled 2018-06-06 (×2): qty 1

## 2018-06-06 MED ORDER — CIPROFLOXACIN IN D5W 400 MG/200ML IV SOLN
400.0000 mg | Freq: Two times a day (BID) | INTRAVENOUS | Status: DC
  Administered 2018-06-06: 400 mg via INTRAVENOUS
  Filled 2018-06-06: qty 200

## 2018-06-06 MED ORDER — SPIRONOLACTONE 12.5 MG HALF TABLET
12.5000 mg | ORAL_TABLET | Freq: Two times a day (BID) | ORAL | Status: DC
  Filled 2018-06-06: qty 1

## 2018-06-06 MED ORDER — DEXTROSE-NACL 5-0.9 % IV SOLN
INTRAVENOUS | Status: DC
  Administered 2018-06-06: 02:00:00 via INTRAVENOUS

## 2018-06-06 MED ORDER — BISOPROLOL FUMARATE 5 MG PO TABS
2.5000 mg | ORAL_TABLET | Freq: Two times a day (BID) | ORAL | Status: DC
  Administered 2018-06-06 – 2018-06-08 (×3): 2.5 mg via ORAL
  Filled 2018-06-06 (×4): qty 1

## 2018-06-06 MED ORDER — NICOTINE 21 MG/24HR TD PT24
21.0000 mg | MEDICATED_PATCH | Freq: Every day | TRANSDERMAL | Status: DC
  Filled 2018-06-06 (×2): qty 1

## 2018-06-06 MED ORDER — FUROSEMIDE 40 MG PO TABS
40.0000 mg | ORAL_TABLET | Freq: Two times a day (BID) | ORAL | Status: DC
  Administered 2018-06-06 – 2018-06-08 (×4): 40 mg via ORAL
  Filled 2018-06-06 (×5): qty 1

## 2018-06-06 MED ORDER — CEFAZOLIN SODIUM-DEXTROSE 2-4 GM/100ML-% IV SOLN
2.0000 g | INTRAVENOUS | Status: AC
  Administered 2018-06-07: 2 g via INTRAVENOUS
  Filled 2018-06-06: qty 100

## 2018-06-06 MED ORDER — CHLORHEXIDINE GLUCONATE CLOTH 2 % EX PADS
6.0000 | MEDICATED_PAD | Freq: Every day | CUTANEOUS | Status: DC
  Administered 2018-06-07 – 2018-06-08 (×2): 6 via TOPICAL

## 2018-06-06 MED ORDER — SPIRONOLACTONE 12.5 MG HALF TABLET
12.5000 mg | ORAL_TABLET | Freq: Every day | ORAL | Status: DC
  Administered 2018-06-06 – 2018-06-08 (×3): 12.5 mg via ORAL
  Filled 2018-06-06 (×3): qty 1

## 2018-06-06 MED ORDER — ONDANSETRON HCL 4 MG/2ML IJ SOLN: 4.0000 mg | Freq: Four times a day (QID) | INTRAMUSCULAR | Status: DC | PRN

## 2018-06-06 MED ORDER — POTASSIUM CHLORIDE CRYS ER 20 MEQ PO TBCR
40.0000 meq | EXTENDED_RELEASE_TABLET | Freq: Once | ORAL | Status: AC
  Administered 2018-06-06: 40 meq via ORAL
  Filled 2018-06-06: qty 2

## 2018-06-06 MED ORDER — NITROGLYCERIN 0.4 MG SL SUBL: 0.4000 mg | SUBLINGUAL_TABLET | SUBLINGUAL | Status: DC | PRN

## 2018-06-06 MED ORDER — AMOXICILLIN-POT CLAVULANATE 500-125 MG PO TABS
1.0000 | ORAL_TABLET | Freq: Three times a day (TID) | ORAL | Status: DC
  Administered 2018-06-06 – 2018-06-08 (×6): 500 mg via ORAL
  Filled 2018-06-06 (×8): qty 1

## 2018-06-06 MED ORDER — MUPIROCIN 2 % EX OINT
1.0000 "application " | TOPICAL_OINTMENT | Freq: Two times a day (BID) | CUTANEOUS | Status: DC
  Administered 2018-06-06 – 2018-06-08 (×4): 1 via NASAL
  Filled 2018-06-06: qty 22

## 2018-06-06 MED ORDER — ALLOPURINOL 100 MG PO TABS
100.0000 mg | ORAL_TABLET | Freq: Every day | ORAL | Status: DC
  Administered 2018-06-06 – 2018-06-08 (×3): 100 mg via ORAL
  Filled 2018-06-06 (×3): qty 1

## 2018-06-06 MED ORDER — PANTOPRAZOLE SODIUM 40 MG PO TBEC
40.0000 mg | DELAYED_RELEASE_TABLET | Freq: Every day | ORAL | Status: DC
  Administered 2018-06-06 – 2018-06-08 (×3): 40 mg via ORAL
  Filled 2018-06-06 (×3): qty 1

## 2018-06-06 MED ORDER — AMLODIPINE BESYLATE 5 MG PO TABS
5.0000 mg | ORAL_TABLET | Freq: Every day | ORAL | Status: DC
  Administered 2018-06-06 – 2018-06-08 (×3): 5 mg via ORAL
  Filled 2018-06-06 (×3): qty 1

## 2018-06-06 MED ORDER — METRONIDAZOLE IN NACL 5-0.79 MG/ML-% IV SOLN
500.0000 mg | Freq: Three times a day (TID) | INTRAVENOUS | Status: DC
  Administered 2018-06-06: 500 mg via INTRAVENOUS
  Filled 2018-06-06 (×2): qty 100

## 2018-06-06 MED ORDER — IPRATROPIUM-ALBUTEROL 0.5-2.5 (3) MG/3ML IN SOLN: 3.0000 mL | Freq: Four times a day (QID) | RESPIRATORY_TRACT | Status: DC | PRN

## 2018-06-06 MED ORDER — TIOTROPIUM BROMIDE MONOHYDRATE 18 MCG IN CAPS
18.0000 ug | ORAL_CAPSULE | Freq: Every day | RESPIRATORY_TRACT | Status: DC
  Filled 2018-06-06: qty 5

## 2018-06-06 MED ORDER — POTASSIUM CHLORIDE CRYS ER 20 MEQ PO TBCR: 20.0000 meq | EXTENDED_RELEASE_TABLET | Freq: Every day | ORAL | Status: DC

## 2018-06-06 MED ORDER — LORATADINE 10 MG PO TABS
10.0000 mg | ORAL_TABLET | Freq: Every day | ORAL | Status: DC
  Administered 2018-06-06 – 2018-06-08 (×3): 10 mg via ORAL
  Filled 2018-06-06 (×3): qty 1

## 2018-06-06 MED ORDER — ONDANSETRON HCL 4 MG PO TABS: 4.0000 mg | ORAL_TABLET | Freq: Four times a day (QID) | ORAL | Status: DC | PRN

## 2018-06-06 MED ORDER — MOMETASONE FURO-FORMOTEROL FUM 200-5 MCG/ACT IN AERO
2.0000 | INHALATION_SPRAY | Freq: Two times a day (BID) | RESPIRATORY_TRACT | Status: DC
  Filled 2018-06-06: qty 8.8

## 2018-06-06 MED ORDER — ATORVASTATIN CALCIUM 80 MG PO TABS
80.0000 mg | ORAL_TABLET | Freq: Every day | ORAL | Status: DC
  Administered 2018-06-06 – 2018-06-07 (×2): 80 mg via ORAL
  Filled 2018-06-06 (×2): qty 1

## 2018-06-06 MED ORDER — MORPHINE SULFATE (PF) 2 MG/ML IV SOLN
2.0000 mg | INTRAVENOUS | Status: DC | PRN
  Administered 2018-06-06 – 2018-06-07 (×3): 2 mg via INTRAVENOUS
  Filled 2018-06-06 (×3): qty 1

## 2018-06-06 MED ORDER — ISOSORBIDE MONONITRATE ER 60 MG PO TB24
60.0000 mg | ORAL_TABLET | Freq: Two times a day (BID) | ORAL | Status: DC
  Administered 2018-06-06 – 2018-06-08 (×3): 60 mg via ORAL
  Filled 2018-06-06 (×3): qty 1

## 2018-06-06 NOTE — Progress Notes (Signed)
Patient ID: Steve Durham, male   DOB: [birth year], 72 y.o.   MRN: [id number]       Subjective: Pt with no new complaints today.  Pain is no better or worse since admission.  Still passing flatus and no nausea.  Objective: Vital signs in last 24 hours: Temp:  [97.8 F (36.6 C)-98.5 F (36.9 C)] 98.5 F (36.9 C) (08/20 0533) Pulse Rate:  [58-79] 58 (08/20 0533) Resp:  [15-20] 16 (08/20 0533) BP: (107-155)/(62-95) 136/66 (08/20 0533) SpO2:  [95 %-100 %] 98 % (08/20 0533)    Intake/Output from previous day: 08/19 0701 - 08/20 0700 In: 667.1 [I.V.:329.5; IV Piggyback:337.6] Out: -  Intake/Output this shift: No intake/output data recorded.  PE: Heart: regular, but +murmur Lungs: some coarse sounds, but overall fairly clear Abd: soft, NT, ND, +BS.  Left inguinal hernia present that is firm to touch and tender to touch, not reducible.  Lab Results:  Recent Labs    06/05/18 1720  WBC 11.2*  HGB 13.9  HCT 43.3  PLT 251   BMET Recent Labs    06/05/18 1720  NA 138  K 4.3  CL 104  CO2 21*  GLUCOSE 87  BUN 18  CREATININE 1.36*  CALCIUM 9.2   PT/INR No results for input(s): LABPROT, INR in the last 72 hours. CMP     Component Value Date/Time   NA 138 06/05/2018 1720   NA 141 03/27/2018 1301   K 4.3 06/05/2018 1720   CL 104 06/05/2018 1720   CO2 21 (L) 06/05/2018 1720   GLUCOSE 87 06/05/2018 1720   BUN 18 06/05/2018 1720   BUN 18 03/27/2018 1301   CREATININE 1.36 (H) 06/05/2018 1720   CREATININE 1.27 (H) 04/27/2016 1516   CALCIUM 9.2 06/05/2018 1720   PROT 7.1 06/05/2018 1720   PROT 6.2 03/31/2015 0905   ALBUMIN 3.9 06/05/2018 1720   ALBUMIN 3.8 03/31/2015 0905   AST 29 06/05/2018 1720   ALT 21 06/05/2018 1720   ALKPHOS 68 06/05/2018 1720   BILITOT 1.0 06/05/2018 1720   BILITOT 0.2 03/31/2015 0905   GFRNONAA 50 (L) 06/05/2018 1720   GFRAA 58 (L) 06/05/2018 1720   Lipase     Component Value Date/Time   LIPASE 44 06/05/2018 1720        Studies/Results: Ct Abdomen Pelvis W Contrast  Result Date: 06/05/2018 CLINICAL DATA:  Incarcerated inguinal hernia. Significant constant pain for the last 3 days. History of prior hernia repair approximately 40 years ago. EXAM: CT ABDOMEN AND PELVIS WITH CONTRAST TECHNIQUE: Multidetector CT imaging of the abdomen and pelvis was performed using the standard protocol following bolus administration of intravenous contrast. CONTRAST:  128mL ISOVUE-300 IOPAMIDOL (ISOVUE-300) INJECTION 61% COMPARISON:  None. FINDINGS: Lower chest: No acute abnormality. Hepatobiliary: Multiple gallstones. No evidence of acute cholecystitis. No focal liver abnormality. No bile duct dilatation. Pancreas: Unremarkable. No pancreatic ductal dilatation or surrounding inflammatory changes. Spleen: Normal in size without focal abnormality. Adrenals/Urinary Tract: Bilateral renal cysts. Kidneys otherwise unremarkable without suspicious mass, stone or hydronephrosis. No ureteral or bladder calculi identified. Bladder is decompressed. Stomach/Bowel: LEFT inguinal hernia which contains the lower descending colon and upper sigmoid colon. There is at least mild thickening of the walls of the colon within the hernia and there is fluid stranding/inflammation within the LEFT inguinal hernia sac. More proximal colon is not distended, therefore, no evidence of associated bowel obstruction. There is diverticulosis throughout the sigmoid and descending colon. Stomach appears normal, partially decompressed. No small bowel  wall thickening or evidence of small bowel inflammation. Appendix is normal. Vascular/Lymphatic: Extensive atherosclerosis of the abdominal aorta. 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 No acute appearing vascular abnormality. No enlarged lymph nodes seen in the abdomen or pelvis. Reproductive: Prostate is unremarkable. Other: No abscess collection identified. No free intraperitoneal air. Musculoskeletal: No acute or suspicious osseous finding. Degenerative changes are seen throughout the thoracolumbar spine, mild to moderate in degree. IMPRESSION: 1. LEFT inguinal hernia contains a portion of the lower descending colon and/or upper sigmoid colon. There is inflammation/fluid stranding within the LEFT inguinal hernia and at least mild thickening of the walls of the involved segment of colon. This inflammatory change may indicate some degree of intermittent incarceration, however, there is no current evidence of associated bowel obstruction (the more proximal portion of the colon is not distended). Alternatively, findings could be related to an acute diverticulitis of the involved segment of colon, as there is extensive diverticulosis throughout the descending and sigmoid colon. 2. 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 Recommend followup by ultrasound in 3 years. This recommendation follows ACR consensus guidelines: White Paper of the ACR Incidental Findings Committee II on Vascular Findings. J Am Coll Radiol 2013; 16:109-604 3. **An incidental finding of potential clinical significance has been found. 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 Recommend nonemergent vascular surgery consultation for further workup and/or follow-up considerations.** 4. Cholelithiasis without evidence of acute cholecystitis. 5. Aortic atherosclerosis. 6. Additional chronic/incidental findings detailed above. Electronically Signed   By: Franki Cabot M.D.   On: 06/05/2018 21:11     Anti-infectives: Anti-infectives (From admission, onward)   Start     Dose/Rate Route Frequency Ordered Stop   06/06/18 0600  ciprofloxacin (CIPRO) IVPB 400 mg     400 mg 200 mL/hr over 60 Minutes Intravenous Every 12 hours 06/06/18 0130     06/06/18 0200  metroNIDAZOLE (FLAGYL) IVPB 500 mg     500 mg 100 mL/hr over 60 Minutes Intravenous Every 8 hours 06/06/18 0130     06/05/18 2300  Ampicillin-Sulbactam (UNASYN) 3 g in sodium chloride 0.9 % 100 mL IVPB     3 g 200 mL/hr over 30 Minutes Intravenous  Once 06/05/18 2259 06/06/18 0057       Assessment/Plan  Chronically incarcerated left inguinal hernia   -plavix being held -cards and pulm consult recommended per Dr. Dois Davenport note from last night to evaluate for surgical risks and preparedness. -patient doesn't want to wait in the hospital for 5 days to let plavix wear off prior to possible surgery, but would be willing to wait 2-3 days.  Will await recommendations from other services and discuss possible timing of surgery with Dr. Hulen Skains. -cont with clears for now -there was a concern for possible diverticulitis of the bowel that is incarcerated within his hernia.  This seems unlikely.  WBC normal.  Not real sure he needs further abx therapy  FEN - IVFs/Clears VTE - SCDs/heparin ID - Cipro/Flagyl 8/19 -->   LOS: 1 day    Henreitta Cea , [facility] Surgery 06/06/2018, 8:43 AM  Pager: [phone]

## 2018-06-06 NOTE — Consult Note (Signed)
Cardiology Consult    Patient ID: Steve Durham MRN: [id number], DOB/AGE: 1946/06/09   Admit date: 06/05/2018 Date of Consult: 06/06/2018  Primary Physician: Townsend Roger, MD Primary Cardiologist: Dr.McAlhany Requesting Provider: Dr. Candiss Norse Reason for Consultation: Pre op   Steve Durham is a 72 y.o. male who is being seen today for the evaluation of pre op evaluation at the request of Dr. Candiss Norse.   Patient Profile    72 yo male with PMH of CAD s/p CABG ('93) with multiple stents, HTN, HL, Aortic valve disease, ICM (last EF 50-55%), and tobacco use who presented with abd pain and found to have incarcerated hernia.   Past Medical History   Past Medical History:  Diagnosis Date  . Abnormal CT scan, kidney    07/2012 - multiple small cysts  . Aortic stenosis    a. Mod AS/AI by cath 07/2012.;  b.  Echo (07/31/13) is: EF 55-60%, mild aortic stenosis (mean gradient 13);  c. Echo (5/15):  EF 40-45%, mild AS (mean 12 mmHg), mod AI  . Back pain   . Carotid artery occlusion    a. Dopplers 07/2012: no significant high grade obstruction.  . Cholelithiasis    Seen on CT 07/2012  . Coronary artery disease    a. CABG 10/1991. b. cath 07/2012 with prog dsz, turned down for re-do CABG - for med rx for now.; c. NSTEMI/PCI: DES to mLAD ext into IMA graft;  d. Inf STEMI (5/15):  LM 60-70% then 99% before LAD, pLAD occl, pCFX stent 80+% ISR, oRCA 99% then occl (L-R collats to dRCA), L-LAD ok with patent stent, S-CFX occl (old), S-RCA occl (old); PCI: LM ext into CFX with Xience Alpine DES  . Heart failure (HCC) systolic  . Hyperlipidemia   . Hyperlipoproteinemia   . Hypertension   . Ischemic cardiomyopathy    a. 2D ECHO: 08/02/2014; EF 45%; severe hypokinesis base/mid-inferolat segments and severe hypokinesis base inferior segment. Mild LVH, mild AS (may be underestimated due to LV dysfunction).  Mean gradient (S): 14 mm Hg. Peak gradient (S): 25 mm Hg. VTI ratio of LVOT to aortic valve: 0.37. Mod  LA dilation. Mild RV systolic dysfxn     Past Surgical History:  Procedure Laterality Date  . CARDIAC CATHETERIZATION  04/03/99  . CARDIAC CATHETERIZATION N/A 03/20/2015   Procedure: Left Heart Cath and Cors/Grafts Angiography;  Surgeon: Jettie Booze, MD;  Location: [place] [facility];  Service: Cardiovascular;  Laterality: N/A;  . CARDIAC CATHETERIZATION N/A 04/28/2016   Procedure: Right/Left Heart Cath and Coronary/Graft Angiography;  Surgeon: Burnell Blanks, MD;  Location: [place] [facility];  Service: Cardiovascular;  Laterality: N/A;  . CARPAL TUNNEL RELEASE  2007   Excision mass dorsal left wrist  . CORONARY ARTERY BYPASS GRAFT  1993  . FASCIOTOMY Right 07/30/2013   Procedure: OPEN FASCIOTOMY RIGHT RING FINGER & RIGHT SMALL FINGER MULTIPLE LEVELS;  Surgeon: Wynonia Sours, MD;  Location: [place];  Service: Orthopedics;  Laterality: Right;  . HERNIA REPAIR  1988  . LEFT HEART CATHETERIZATION WITH CORONARY ANGIOGRAM N/A 02/28/2014   Procedure: LEFT HEART CATHETERIZATION WITH CORONARY ANGIOGRAM;  Surgeon: Troy Sine, MD;  Location: [facility];  Service: Cardiovascular;  Laterality: N/A;  . LEFT HEART CATHETERIZATION WITH CORONARY/GRAFT ANGIOGRAM N/A 08/10/2012   Procedure: LEFT HEART CATHETERIZATION WITH Beatrix Fetters;  Surgeon: Hillary Bow, MD;  Location: [facility];  Service: Cardiovascular;  Laterality: N/A;  . LEFT  HEART CATHETERIZATION WITH CORONARY/GRAFT ANGIOGRAM N/A 08/01/2014   Procedure: LEFT HEART CATHETERIZATION WITH Beatrix Fetters;  Surgeon: Leonie Man, MD;  Location: [facility] CATH LAB;  Service: Cardiovascular;  Laterality: N/A;  . LEFT HEART CATHETERIZATION WITH CORONARY/GRAFT ANGIOGRAM N/A 02/12/2015   Procedure: LEFT HEART CATHETERIZATION WITH Beatrix Fetters;  Surgeon: Burnell Blanks, MD;  Location: [facility] CATH LAB;  Service: Cardiovascular;  Laterality: N/A;  . PERCUTANEOUS CORONARY STENT INTERVENTION  (PCI-S)  07/31/2013   Procedure: PERCUTANEOUS CORONARY STENT INTERVENTION (PCI-S);  Surgeon: Burnell Blanks, MD;  Location: [facility] CATH LAB;  Service: Cardiovascular;;  LIMA to LAD at the anastomosis with aortic root shot   . RIGHT/LEFT HEART CATH AND CORONARY/GRAFT ANGIOGRAPHY N/A 04/04/2018   Procedure: RIGHT/LEFT HEART CATH AND CORONARY/GRAFT ANGIOGRAPHY;  Surgeon: Martinique, Peter M, MD;  Location: [place] [facility];  Service: Cardiovascular;  Laterality: N/A;     Allergies  No Known Allergies  History of Present Illness    Steve Durham is a 72 year old male with extensive CAD history status post CABG with multiple stents, hypertension, hyperlipidemia, aortic valve disease, history of ischemic cardiomyopathy and tobacco use.  He is followed by Dr. Angelena Form as an outpatient.  He was admitted back in October 2013 with cardiac cath showing occlusion of his LAD patent LIMA to the LAD, occluded RCA with occluded SVG to PDA and severe disease in the left main extending into the circumflex with occluded SVG to circumflex.  Redo CABG which suggested at that time he was evaluated by surgery but felt not to be a candidate.  He was admitted again in October 2014 with an NSTEMI and had PCI/DES to the mid LAD extending back into his LIMA graft.  Again admitted in May 2015 with an inferior lateral STEMI and found to have progression of his left main/proximal circumflex disease and previously placed stent.  DES was placed from the circumflex back into the left main.  Readmitted again in October 2015 with unstable angina and a drug-eluting stent was placed to the first OM branch, and left main restenosis was treated with balloon angioplasty.  Underwent repeat cath in April and June 2016 with stable disease.  Most recent cath was in June 2019 which showed severe three-vessel disease with patent stent of the LIMA to LAD, stents in the left main circumflex and OM.  His right heart pressures were normal.  Does have  history of aortic valve disease with echo back in June 2019 showed normal systolic function with moderate left ear with a mean gradient of 20 mmHg.  Despite his CAD he continues to smoke.   Reports he developed a left-sided inguinal hernia about 6 months ago which was being watched.  Presented to his PCP office yesterday with worsening pain.  And found to have an incarcerated left inguinal hernia, with concerns for diverticulitis.  He was evaluated by surgery and felt not to have diverticulitis only an incarcerated hernia.  He was placed on empiric antibiotics and admitted to internal medicine.  There are plans for possible surgery.  His admission labs showed stable electrolytes, creatinine of 1.3, hemoglobin 13.9.  Cardiology has been asked for preoperative evaluation.  In talking with patient he has not had any anginal symptoms prior to admission.  He is chronically short of breath, but notes this is not usual.   Inpatient Medications    . allopurinol  100 mg Oral Daily  . amLODipine  5 mg Oral Daily  . amoxicillin-clavulanate  1 tablet Oral Q8H  .  atorvastatin  80 mg Oral q1800  . bisoprolol  2.5 mg Oral BID  . Chlorhexidine Gluconate Cloth  6 each Topical Daily  . furosemide  40 mg Oral BID  . heparin  5,000 Units Subcutaneous Q8H  . isosorbide mononitrate  60 mg Oral BID  . loratadine  10 mg Oral Daily  . mometasone-formoterol  2 puff Inhalation BID  . mupirocin ointment  1 application Nasal BID  . nicotine  21 mg Transdermal Daily  . pantoprazole  40 mg Oral Daily  . spironolactone  12.5 mg Oral Daily  . tiotropium  18 mcg Inhalation Daily    Family History    Family History  Problem Relation Age of Onset  . Heart attack Mother        died @ 19.  Marland Kitchen Hypertension Mother   . Hypertension Father   . Hypertension Sister   . Emphysema Brother   . Hypertension Brother   . Allergies Sister   . Stroke Neg Hx     Social History    Social History   Socioeconomic History  .  Marital status: Married    Spouse name: Not on file  . Number of children: 1  . Years of education: Not on file  . Highest education level: Not on file  Occupational History  . Occupation: Plumbing    Comment: Retired in 2007  Social Needs  . Financial resource strain: Not on file  . Food insecurity:    Worry: Not on file    Inability: Not on file  . Transportation needs:    Medical: Not on file    Non-medical: Not on file  Tobacco Use  . Smoking status: Current Every Day Smoker    Packs/day: 1.00    Years: 57.00    Pack years: 57.00    Types: Cigarettes  . Smokeless tobacco: Never Used  Substance and Sexual Activity  . Alcohol use: Yes    Alcohol/week: 5.0 standard drinks    Types: 5 Cans of beer per week    Comment: daily-6 daily  . Drug use: No  . Sexual activity: Not on file  Lifestyle  . Physical activity:    Days per week: Not on file    Minutes per session: Not on file  . Stress: Not on file  Relationships  . Social connections:    Talks on phone: Not on file    Gets together: Not on file    Attends religious service: Not on file    Active member of club or organization: Not on file    Attends meetings of clubs or organizations: Not on file    Relationship status: Not on file  . Intimate partner violence:    Fear of current or ex partner: Not on file    Emotionally abused: Not on file    Physically abused: Not on file    Forced sexual activity: Not on file  Other Topics Concern  . Not on file  Social History Narrative   Lives in [place] with his family.  Does not routinely exercise.     Review of Systems    See HPI  All other systems reviewed and are otherwise negative except as noted above.  Physical Exam    Blood pressure 136/66, pulse (!) 58, temperature 98.5 F (36.9 C), temperature source Oral, resp. rate 16, SpO2 98 %.  General: Pleasant, older thin WM, NAD Psych: Normal affect. Neuro: Alert and oriented X 3. Moves all extremities  spontaneously. HEENT: Normal  Neck: Supple, no JVD. Lungs:  Resp regular and unlabored, expiratory wheezing noted. Heart: RRR, 3/6 systolic murmur. Abdomen: Soft, non-tender, non-distended, BS + x 4.  Extremities: No clubbing, cyanosis or edema.  Labs    Troponin (Point of Care Test) No results for input(s): TROPIPOC in the last 72 hours. No results for input(s): CKTOTAL, CKMB, TROPONINI in the last 72 hours. Lab Results  Component Value Date   WBC 8.9 06/06/2018   HGB 13.0 06/06/2018   HCT 41.1 06/06/2018   MCV 101.5 (H) 06/06/2018   PLT 210 06/06/2018    Recent Labs  Lab 06/06/18 0634  NA 139  K 3.5  CL 107  CO2 25  BUN 16  CREATININE 1.38*  CALCIUM 8.6*  PROT 6.3*  BILITOT 1.1  ALKPHOS 55  ALT 19  AST 21  GLUCOSE 113*   Lab Results  Component Value Date   CHOL 137 08/18/2015   HDL 42 08/18/2015   LDLCALC 69 08/18/2015   TRIG 128 08/18/2015   No results found for: [facility]   Radiology Studies    Ct Abdomen Pelvis W Contrast  Result Date: 06/05/2018 CLINICAL DATA:  Incarcerated inguinal hernia. Significant constant pain for the last 3 days. History of prior hernia repair approximately 40 years ago. EXAM: CT ABDOMEN AND PELVIS WITH CONTRAST TECHNIQUE: Multidetector CT imaging of the abdomen and pelvis was performed using the standard protocol following bolus administration of intravenous contrast. CONTRAST:  121mL ISOVUE-300 IOPAMIDOL (ISOVUE-300) INJECTION 61% COMPARISON:  None. FINDINGS: Lower chest: No acute abnormality. Hepatobiliary: Multiple gallstones. No evidence of acute cholecystitis. No focal liver abnormality. No bile duct dilatation. Pancreas: Unremarkable. No pancreatic ductal dilatation or surrounding inflammatory changes. Spleen: Normal in size without focal abnormality. Adrenals/Urinary Tract: Bilateral renal cysts. Kidneys otherwise unremarkable without suspicious mass, stone or hydronephrosis. No ureteral or bladder calculi identified. Bladder is  decompressed. Stomach/Bowel: LEFT inguinal hernia which contains the lower descending colon and upper sigmoid colon. There is at least mild thickening of the walls of the colon within the hernia and there is fluid stranding/inflammation within the LEFT inguinal hernia sac. More proximal colon is not distended, therefore, no evidence of associated bowel obstruction. There is diverticulosis throughout the sigmoid and descending colon. Stomach appears normal, partially decompressed. No small bowel wall thickening or evidence of small bowel inflammation. Appendix is normal. Vascular/Lymphatic: Extensive atherosclerosis of the abdominal aorta. 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 There is milder aneurysmal dilatation of the proximal RIGHT common iliac artery, measuring 1.9 cm diameter. No acute appearing vascular abnormality. No enlarged lymph nodes seen in the abdomen or pelvis. Reproductive: Prostate is unremarkable. Other: No abscess collection identified. No free intraperitoneal air. Musculoskeletal: No acute or suspicious osseous finding. Degenerative changes are seen throughout the thoracolumbar spine, mild to moderate in degree. IMPRESSION: 1. LEFT inguinal hernia contains a portion of the lower descending colon and/or upper sigmoid colon. There is inflammation/fluid stranding within the LEFT inguinal hernia and at least mild thickening of the walls of the involved segment of colon. This inflammatory change may indicate some degree of intermittent incarceration, however, there is no current evidence of associated bowel obstruction (the more proximal portion of the colon is not distended). Alternatively, findings could be related to an acute diverticulitis of the involved segment of colon, as there is  extensive  diverticulosis throughout the descending and sigmoid colon. 2. 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 Recommend followup by ultrasound in 3 years. This recommendation follows ACR consensus guidelines: White Paper of the ACR Incidental Findings Committee II on Vascular Findings. J Am Coll Radiol 2013; 15:400-867 3. **An incidental finding of potential clinical significance has been found. 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 Recommend nonemergent vascular surgery consultation for further workup and/or follow-up considerations.** 4. Cholelithiasis without evidence of acute cholecystitis. 5. Aortic atherosclerosis. 6. Additional chronic/incidental findings detailed above. Electronically Signed   By: Franki Cabot M.D.   On: 06/05/2018 21:11   ECG & Cardiac Imaging    EKG:  The EKG was personally reviewed and demonstrates (ordered)  Echo: 03/24/18  Study Conclusions  - Left ventricle: The cavity size was normal. Wall thickness was   increased in a pattern of mild LVH. Systolic function was normal.   The estimated ejection fraction was in the range of 50% to 55%.   Inferoseptal hypokinesis. The study is not technically sufficient   to allow evaluation of LV diastolic function. - Aortic valve: Moderate stenosis and moderate regurgitation. Mean   gradient (S): 20 mm Hg. Peak gradient (S): 43 mm Hg. - Mitral valve: Calcified annulus. Mildly thickened leaflets .   There was mild regurgitation. - Left atrium: Moderately dilated. - Right ventricle: The cavity size was normal. Systolic function   was low normal. - Inferior vena cava: The vessel was normal in size. The   respirophasic diameter changes were in the normal range (>= 50%),   consistent with normal central venous pressure.  Impressions:  - LVEF 50-55%,  mild LVH, inferoseptal hypokinesis, moderate aortic   stenosis with moderate AI and mean gradient of 20 mmHg, mild MR,   moderate LAE, noraml IvC.  Cath: 04/04/18   Non-stenotic Ost LM to LM lesion previously treated.  Ost LAD to Prox LAD lesion is 100% stenosed.  Ost Cx to Prox Cx lesion is 50% stenosed.  Ost RCA to Prox RCA lesion is 100% stenosed.  Previously placed Dist LAD stent (unknown type) is widely patent.  Previously placed Ost 1st Mrg stent (unknown type) is widely patent.  LV end diastolic pressure is normal.   1. Severe 3 vessel occlusive CAD. Continued patency of stent in the IMA/LAD. Continued patency of stents in the left main/LCx/OM. There is a focal 50% stenosis in the stented segment.  2. Occluded SVG to the RCA 3. Occluded SVG to the OM. 4. Patent LIMA to the LAD 5. Normal LV filling pressures 6. Mild Aortic stenosis with minimal gradient by cath 7. Normal right heart pressures.  8. Normal cardiac output  Plan: Compared to 2017 there is no significant change. I suspect his increased shortness of breath is more related to COPD.   Assessment & Plan   72 yo male with PMH of CAD s/p CABG ('93) with multiple stents, HTN, HL, Aortic valve disease, ICM (last EF 50-55%), and tobacco use who presented with abd pain and found to have incarcerated hernia.   Preop Evaluation: He has an extensive CAD hx with CABG and multiple stents since that time. On plavix as an outpatient, reports last dose was yesterday morning. He has had no anginal symptoms prior to admission. Recent cath and echo without acute changes. At this time would not  pursue further ischemic work up at this time. [place] for surgery when felt appropriate.   CAD s/p CABG with multiple stents: Recent cath was back in 6/19 and unchanged from cath back in 2017. Ok to hold plavix given his last stent was done back in 2015.   ICM: Last EF noted at 50-55% on echo back in 6/19. No signs of volume overload.   Aortic  Valve disease: noted on moderate on echo from June.    Barnet Pall, NP-C Pager [phone] 06/06/2018, 2:07 PM

## 2018-06-06 NOTE — Progress Notes (Addendum)
@IPLOG @        PROGRESS NOTE                                                                                                                                                                                                             Patient Demographics:    Steve Durham, is a 72 y.o. male, DOB - Feb 21, 1946, SAY:[id number]  Admit date - 06/05/2018   Admitting Physician Elwyn Reach, MD  Outpatient Primary MD for the patient is Townsend Roger, MD  LOS - 1  Chief Complaint  Patient presents with  . Hernia       Brief Narrative   Steve Durham is a 72 y.o. male with medical history significant of chronic left inguinal hernia, ongoing tobacco abuse, hypertension, coronary artery disease with previous coronary artery bypass grafting and stenting, COPD, peripheral vascular disease who presented to the ER with abdominal pain nausea and vomiting.  He was diagnosed with left sided inguinal hernia that is incarcerated with acute on chronic decompensation.    Subjective:    Steve Durham today has, No headache, No chest pain, chronic nonradiating lower abdominal pain - No Nausea, No new weakness tingling or numbness, No Cough - SOB.     Assessment  & Plan :     1.  Acute on chronic left lower abdominal pain with new nausea vomiting due to worsening of his incarcerated left chronic inguinal hernia -his symptoms are somewhat worse than his baseline, he never had nausea vomiting before and is slightly tender at this point, general surgery on board, Plavix on hold anticipating surgery in the next 2 to 3 days.  To new conservative management with liquid diet, pain control.  Surgery requested preop eval by cardiology which has been requested, in terms of his pulmonary status he has moderate COPD and is an active smoker but not on home oxygen.  His medications have been adjusted.    As for his perioperative risk he will be a moderate risk candidate for adverse cardiopulmonary outcome during the  perioperative,  Recommendations below.  Cardio-Pulm Risk stratification for surgery and recommendations to minimize the same:-  A.Cardio-Pulmonary Risk -  this patient is a moderate risk  for adverse Cardio-Pulmonary  Outcome  from surgery, the risks and benefits were discussed and acceptable to the patient.  Recommendations for optimizing Cardio-Pulmonary  Risk risk factors  1. Keep SBP<140, HR<85, use Lopressor 5mg  IV q4hrs PRN, or B.Blocker drip PRN. 2. Moniotr I&Os. 3. Minimal sedation and Narcotics. 4. Good  pulmunary toilet. 5. PRN Nebs and as needed oxygen to keep Pox>90% 6. Hb>8, transfuse as needed- Lasix 10mg  IV after each unit PRBC Transfused.   B.Bleeding Risk - no previous surgical complications, no easy bruising,  Antiplate meds Plavix last dose 06/05/2018.   Lab Results  Component Value Date   PLT 210 06/06/2018                  Lab Results  Component Value Date   INR 1.0 04/27/2016   INR 1.08 03/20/2015   INR 1.0 02/10/2015      Will request Surgeon to please Order DVT prophylaxis of his/her choice, along with activity, weight bearing precautions and diet if appropriate.     2.  CAD status post CABG and multiple stents.  Last stent over 1 year ago.  Also moderate aortic stenosis -is currently compensated from a heart standpoint, continue beta-blocker, statin, Imdur, Lasix along with Aldactone, recent echocardiogram as below, cardiology requested to evaluate preop.  No active chest pain, does have murmur of aortic stenosis which is old, no dynamic changes on EKG.  3. COPD with active smoking.  Not on oxygen, not on any COPD specific medications, he has been counseled to quit smoking, he has been placed on Spiriva, Dulera, flutter valve and I-S for pulmonary toiletry, requested to stay up in chair for better pulmonary hygiene as much as possible.  Oxygen if needed to keep pulse ox over 92, currently no need for inpatient pulmonary input.  4.  Incidental finding of  abdominal aorta, bilateral iliac aneurysm.  Resume antiplatelet medications when possible, continue beta-blocker, he is asymptomatic, post discharge will need to follow with vascular surgery within 2 to 3 weeks.  5.  Dyslipidemia.  On statin.  6.  Essential hypertension.  Continue combination of Norvasc, diuretics, beta-blocker, Imdur.  Monitor and adjust.  PRN hydralazine and beta-blocker also on board.  7.  GERD.  On PPI.  8.  CKD 3.  Baseline creatinine anywhere between 1.2-1.4.  At baseline continue to monitor.  9.  Hypokalemia.  Replaced.    Diet :  Diet Order            Diet clear liquid Room service appropriate? Yes; Fluid consistency: Thin  Diet effective now               Family Communication  :  None   Code Status :  Full  Disposition Plan  :  Home after surgery  Consults  :  CCS, Cards  Procedures  :    Echo 03/2018  - LVEF 50-55%, mild LVH, inferoseptal hypokinesis, moderate aortic stenosis with moderate AI and mean gradient of 20 mmHg, mild MR, moderate LAE, noraml IvC.  DVT Prophylaxis  :  Heparin   Lab Results  Component Value Date   PLT 210 06/06/2018    Inpatient Medications  Scheduled Meds: . allopurinol  100 mg Oral Daily  . amLODipine  5 mg Oral Daily  . amoxicillin-clavulanate  1 tablet Oral Q8H  . atorvastatin  80 mg Oral q1800  . bisoprolol  2.5 mg Oral BID  . Chlorhexidine Gluconate Cloth  6 each Topical Daily  . furosemide  40 mg Oral BID  . heparin  5,000 Units Subcutaneous Q8H  . isosorbide mononitrate  60 mg Oral BID  . loratadine  10 mg Oral Daily  . mometasone-formoterol  2 puff Inhalation BID  . mupirocin ointment  1 application Nasal BID  . nicotine  21 mg  Transdermal Daily  . pantoprazole  40 mg Oral Daily  . potassium chloride  40 mEq Oral Once  . spironolactone  12.5 mg Oral Daily  . tiotropium  18 mcg Inhalation Daily   Continuous Infusions: . dextrose 5 % and 0.9% NaCl 100 mL/hr at 06/06/18 0155   PRN  Meds:.ipratropium-albuterol, morphine injection, nitroGLYCERIN, [DISCONTINUED] ondansetron **OR** ondansetron (ZOFRAN) IV  Antibiotics  :    Anti-infectives (From admission, onward)   Start     Dose/Rate Route Frequency Ordered Stop   06/06/18 1400  amoxicillin-clavulanate (AUGMENTIN) 500-125 MG per tablet 500 mg     1 tablet Oral Every 8 hours 06/06/18 1011     06/06/18 0600  ciprofloxacin (CIPRO) IVPB 400 mg  Status:  Discontinued     400 mg 200 mL/hr over 60 Minutes Intravenous Every 12 hours 06/06/18 0130 06/06/18 1011   06/06/18 0200  metroNIDAZOLE (FLAGYL) IVPB 500 mg  Status:  Discontinued     500 mg 100 mL/hr over 60 Minutes Intravenous Every 8 hours 06/06/18 0130 06/06/18 1011   06/05/18 2300  Ampicillin-Sulbactam (UNASYN) 3 g in sodium chloride 0.9 % 100 mL IVPB     3 g 200 mL/hr over 30 Minutes Intravenous  Once 06/05/18 2259 06/06/18 0057         Objective:   Vitals:   06/06/18 0030 06/06/18 0100 06/06/18 0128 06/06/18 0533  BP: (!) 146/62 131/74 (!) 145/73 136/66  Pulse: 70 74 79 (!) 58  Resp: 17 15 16 16   Temp:   97.8 F (36.6 C) 98.5 F (36.9 C)  TempSrc:   Oral Oral  SpO2: 98% 96% 100% 98%    Wt Readings from Last 3 Encounters:  05/26/18 67.2 kg  04/26/18 66.2 kg  04/04/18 66.7 kg     Intake/Output Summary (Last 24 hours) at 06/06/2018 1011 Last data filed at 06/06/2018 0700 Gross per 24 hour  Intake 667.13 ml  Output -  Net 667.13 ml     Physical Exam  Awake Alert, Oriented X 3, No new F.N deficits, Normal affect Morton.AT,PERRAL Supple Neck,No JVD, No cervical lymphadenopathy appriciated.  Symmetrical Chest wall movement, Good air movement bilaterally, CTAB RRR,No Gallops,Rubs, +ve aortic systolic murmur, No Parasternal Heave +ve B.Sounds, Abd Soft, No tenderness, left inguinal hernia nonreducible, nontender No Cyanosis, Clubbing or edema, No new Rash or bruise       Data Review:    CBC Recent Labs  Lab 06/05/18 1720 06/06/18 0634  WBC  11.2* 8.9  HGB 13.9 13.0  HCT 43.3 41.1  PLT 251 210  MCV 101.6* 101.5*  MCH 32.6 32.1  MCHC 32.1 31.6  RDW 13.3 13.2  LYMPHSABS 1.7  --   MONOABS 1.2*  --   EOSABS 0.2  --   BASOSABS 0.1  --     Chemistries  Recent Labs  Lab 06/05/18 1720 06/06/18 0634  NA 138 139  K 4.3 3.5  CL 104 107  CO2 21* 25  GLUCOSE 87 113*  BUN 18 16  CREATININE 1.36* 1.38*  CALCIUM 9.2 8.6*  AST 29 21  ALT 21 19  ALKPHOS 68 55  BILITOT 1.0 1.1   ------------------------------------------------------------------------------------------------------------------ No results for input(s): CHOL, HDL, LDLCALC, TRIG, CHOLHDL, LDLDIRECT in the last 72 hours.  Lab Results  Component Value Date   HGBA1C 5.7 (H) 07/31/2013   ------------------------------------------------------------------------------------------------------------------ No results for input(s): TSH, T4TOTAL, T3FREE, THYROIDAB in the last 72 hours.  Invalid input(s): FREET3 ------------------------------------------------------------------------------------------------------------------ No results for input(s): VITAMINB12, FOLATE, FERRITIN,  TIBC, IRON, RETICCTPCT in the last 72 hours.  Coagulation profile No results for input(s): INR, PROTIME in the last 168 hours.  No results for input(s): DDIMER in the last 72 hours.  Cardiac Enzymes No results for input(s): CKMB, TROPONINI, MYOGLOBIN in the last 168 hours.  Invalid input(s): CK ------------------------------------------------------------------------------------------------------------------    Component Value Date/Time   BNP 407.2 (H) 05/01/2015 1251    Micro Results Recent Results (from the past 240 hour(s))  Surgical pcr screen     Status: Abnormal   Collection Time: 06/06/18  1:49 AM  Result Value Ref Range Status   MRSA, PCR NEGATIVE NEGATIVE Final   Staphylococcus aureus POSITIVE (A) NEGATIVE Final    Comment: (NOTE) The Xpert SA Assay (FDA approved for NASAL  specimens in patients 26 years of age and older), is one component of a comprehensive surveillance program. It is not intended to diagnose infection nor to guide or monitor treatment. Performed at [facility], [place] [address] [place], [place] [address]     Radiology Reports Ct Abdomen Pelvis W Contrast  Result Date: 06/05/2018 CLINICAL DATA:  Incarcerated inguinal hernia. Significant constant pain for the last 3 days. History of prior hernia repair approximately 40 years ago. EXAM: CT ABDOMEN AND PELVIS WITH CONTRAST TECHNIQUE: Multidetector CT imaging of the abdomen and pelvis was performed using the standard protocol following bolus administration of intravenous contrast. CONTRAST:  148mL ISOVUE-300 IOPAMIDOL (ISOVUE-300) INJECTION 61% COMPARISON:  None. FINDINGS: Lower chest: No acute abnormality. Hepatobiliary: Multiple gallstones. No evidence of acute cholecystitis. No focal liver abnormality. No bile duct dilatation. Pancreas: Unremarkable. No pancreatic ductal dilatation or surrounding inflammatory changes. Spleen: Normal in size without focal abnormality. Adrenals/Urinary Tract: Bilateral renal cysts. Kidneys otherwise unremarkable without suspicious mass, stone or hydronephrosis. No ureteral or bladder calculi identified. Bladder is decompressed. Stomach/Bowel: LEFT inguinal hernia which contains the lower descending colon and upper sigmoid colon. There is at least mild thickening of the walls of the colon within the hernia and there is fluid stranding/inflammation within the LEFT inguinal hernia sac. More proximal colon is not distended, therefore, no evidence of associated bowel obstruction. There is diverticulosis throughout the sigmoid and descending colon. Stomach appears normal, partially decompressed. No small bowel wall thickening or evidence of small bowel inflammation. Appendix is normal. Vascular/Lymphatic: Extensive atherosclerosis of the abdominal aorta. Mild aneurysmal  dilatation of the lower abdominal aorta, measuring 3.2 cm diameter, with associated mural thrombus. 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 There is milder aneurysmal dilatation of the proximal RIGHT common iliac artery, measuring 1.9 cm diameter. No acute appearing vascular abnormality. No enlarged lymph nodes seen in the abdomen or pelvis. Reproductive: Prostate is unremarkable. Other: No abscess collection identified. No free intraperitoneal air. Musculoskeletal: No acute or suspicious osseous finding. Degenerative changes are seen throughout the thoracolumbar spine, mild to moderate in degree. IMPRESSION: 1. LEFT inguinal hernia contains a portion of the lower descending colon and/or upper sigmoid colon. There is inflammation/fluid stranding within the LEFT inguinal hernia and at least mild thickening of the walls of the involved segment of colon. This inflammatory change may indicate some degree of intermittent incarceration, however, there is no current evidence of associated bowel obstruction (the more proximal portion of the colon is not distended). Alternatively, findings could be related to an acute diverticulitis of the involved segment of colon, as there is extensive diverticulosis throughout the descending and sigmoid colon. 2. Mild aneurysm  of the lower abdominal aortic aneurysm, measuring 3.2 cm diameter. Recommend followup by ultrasound in 3 years. This recommendation follows ACR consensus guidelines: White Paper of the ACR Incidental Findings Committee II on Vascular Findings. J Am Coll Radiol 2013; 68:372-902 3. **An incidental finding of potential clinical significance has been found. 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 Recommend nonemergent vascular surgery consultation  for further workup and/or follow-up considerations.** 4. Cholelithiasis without evidence of acute cholecystitis. 5. Aortic atherosclerosis. 6. Additional chronic/incidental findings detailed above. Electronically Signed   By: Franki Cabot M.D.   On: 06/05/2018 21:11    Time Spent in minutes  30   Lala Lund M.D on 06/06/2018 at 10:11 AM  To page go to [email or web address] - password [email or web address]

## 2018-06-07 ENCOUNTER — Inpatient Hospital Stay (HOSPITAL_COMMUNITY): Payer: Medicare Other | Admitting: Certified Registered Nurse Anesthetist

## 2018-06-07 ENCOUNTER — Encounter (HOSPITAL_COMMUNITY): Payer: Self-pay | Admitting: Certified Registered Nurse Anesthetist

## 2018-06-07 ENCOUNTER — Encounter (HOSPITAL_COMMUNITY)
Admission: EM | Disposition: A | Discharge status: Home / Self Care | Payer: Self-pay | Source: Home / Self Care | Attending: Internal Medicine

## 2018-06-07 DIAGNOSIS — F17210 Nicotine dependence, cigarettes, uncomplicated: Secondary | ICD-10-CM

## 2018-06-07 DIAGNOSIS — K4090 Unilateral inguinal hernia, without obstruction or gangrene, not specified as recurrent: Secondary | ICD-10-CM

## 2018-06-07 DIAGNOSIS — I10 Essential (primary) hypertension: Secondary | ICD-10-CM

## 2018-06-07 DIAGNOSIS — I255 Ischemic cardiomyopathy: Secondary | ICD-10-CM

## 2018-06-07 DIAGNOSIS — I2510 Atherosclerotic heart disease of native coronary artery without angina pectoris: Secondary | ICD-10-CM

## 2018-06-07 HISTORY — PX: INSERTION OF MESH: SHX5868

## 2018-06-07 HISTORY — PX: INGUINAL HERNIA REPAIR: SHX194

## 2018-06-07 LAB — BASIC METABOLIC PANEL
Anion gap: 7 (ref 5–15)
BUN: 10 mg/dL (ref 8–23)
CHLORIDE: 106 mmol/L (ref 98–111)
CO2: 26 mmol/L (ref 22–32)
Calcium: 8.7 mg/dL — ABNORMAL LOW (ref 8.9–10.3)
Creatinine, Ser: 1.3 mg/dL — ABNORMAL HIGH (ref 0.61–1.24)
GFR calc non Af Amer: 52 mL/min — ABNORMAL LOW (ref >60)
Glucose, Bld: 95 mg/dL (ref 70–99)
POTASSIUM: 4.1 mmol/L (ref 3.5–5.1)
SODIUM: 139 mmol/L (ref 135–145)

## 2018-06-07 LAB — CBC
HEMATOCRIT: 43.1 % (ref 39.0–52.0)
HEMOGLOBIN: 13.9 g/dL (ref 13.0–17.0)
MCH: 32.8 pg (ref 26.0–34.0)
MCHC: 32.3 g/dL (ref 30.0–36.0)
MCV: 101.7 fL — ABNORMAL HIGH (ref 78.0–100.0)
Platelets: 219 10*3/uL (ref 150–400)
RBC: 4.24 MIL/uL (ref 4.22–5.81)
RDW: 13.4 % (ref 11.5–15.5)
WBC: 9.7 10*3/uL (ref 4.0–10.5)

## 2018-06-07 LAB — PROTIME-INR
INR: 1.01
PROTHROMBIN TIME: 13.2 s (ref 11.4–15.2)

## 2018-06-07 LAB — MAGNESIUM: MAGNESIUM: 2 mg/dL (ref 1.7–2.4)

## 2018-06-07 SURGERY — REPAIR, HERNIA, INGUINAL, ADULT
Anesthesia: General | Site: Groin | Laterality: Left

## 2018-06-07 MED ORDER — DEXAMETHASONE SODIUM PHOSPHATE 10 MG/ML IJ SOLN
INTRAMUSCULAR | Status: DC | PRN
  Administered 2018-06-07: 10 mg via INTRAVENOUS

## 2018-06-07 MED ORDER — BUPIVACAINE-EPINEPHRINE (PF) 0.5% -1:200000 IJ SOLN
INTRAMUSCULAR | Status: AC
  Filled 2018-06-07: qty 30

## 2018-06-07 MED ORDER — 0.9 % SODIUM CHLORIDE (POUR BTL) OPTIME
TOPICAL | Status: DC | PRN
  Administered 2018-06-07: 1000 mL

## 2018-06-07 MED ORDER — LIDOCAINE 2% (20 MG/ML) 5 ML SYRINGE
INTRAMUSCULAR | Status: AC
  Filled 2018-06-07: qty 5

## 2018-06-07 MED ORDER — MIDAZOLAM HCL 5 MG/5ML IJ SOLN
INTRAMUSCULAR | Status: DC | PRN
  Administered 2018-06-07 (×2): 1 mg via INTRAVENOUS

## 2018-06-07 MED ORDER — SUGAMMADEX SODIUM 200 MG/2ML IV SOLN
INTRAVENOUS | Status: DC | PRN
  Administered 2018-06-07: 134.4 mg via INTRAVENOUS

## 2018-06-07 MED ORDER — MEPERIDINE HCL 50 MG/ML IJ SOLN
INTRAMUSCULAR | Status: AC
  Filled 2018-06-07: qty 1

## 2018-06-07 MED ORDER — FENTANYL CITRATE (PF) 100 MCG/2ML IJ SOLN
INTRAMUSCULAR | Status: DC | PRN
  Administered 2018-06-07 (×5): 50 ug via INTRAVENOUS

## 2018-06-07 MED ORDER — BUPIVACAINE-EPINEPHRINE 0.5% -1:200000 IJ SOLN
INTRAMUSCULAR | Status: DC | PRN
  Administered 2018-06-07: 20 mL

## 2018-06-07 MED ORDER — LACTATED RINGERS IV SOLN
INTRAVENOUS | Status: DC | PRN
  Administered 2018-06-07 (×2): via INTRAVENOUS

## 2018-06-07 MED ORDER — PROPOFOL 10 MG/ML IV BOLUS
INTRAVENOUS | Status: AC
  Filled 2018-06-07: qty 20

## 2018-06-07 MED ORDER — HEPARIN SODIUM (PORCINE) 5000 UNIT/ML IJ SOLN
5000.0000 [IU] | Freq: Three times a day (TID) | INTRAMUSCULAR | Status: DC
  Filled 2018-06-07: qty 1

## 2018-06-07 MED ORDER — FENTANYL CITRATE (PF) 100 MCG/2ML IJ SOLN
INTRAMUSCULAR | Status: AC
  Filled 2018-06-07: qty 2

## 2018-06-07 MED ORDER — FENTANYL CITRATE (PF) 100 MCG/2ML IJ SOLN
25.0000 ug | INTRAMUSCULAR | Status: DC | PRN
  Administered 2018-06-07: 25 ug via INTRAVENOUS
  Administered 2018-06-07: 50 ug via INTRAVENOUS

## 2018-06-07 MED ORDER — PROPOFOL 10 MG/ML IV BOLUS
INTRAVENOUS | Status: DC | PRN
  Administered 2018-06-07: 130 mg via INTRAVENOUS

## 2018-06-07 MED ORDER — ONDANSETRON HCL 4 MG/2ML IJ SOLN
INTRAMUSCULAR | Status: DC | PRN
  Administered 2018-06-07: 4 mg via INTRAVENOUS

## 2018-06-07 MED ORDER — ROPIVACAINE HCL 5 MG/ML IJ SOLN
INTRAMUSCULAR | Status: DC | PRN
  Administered 2018-06-07: 30 mL

## 2018-06-07 MED ORDER — FENTANYL CITRATE (PF) 250 MCG/5ML IJ SOLN
INTRAMUSCULAR | Status: AC
  Filled 2018-06-07: qty 5

## 2018-06-07 MED ORDER — MIDAZOLAM HCL 2 MG/2ML IJ SOLN
INTRAMUSCULAR | Status: AC
  Filled 2018-06-07: qty 2

## 2018-06-07 MED ORDER — SODIUM CHLORIDE 0.9 % IV SOLN
INTRAVENOUS | Status: DC | PRN
  Administered 2018-06-07: 500 mL

## 2018-06-07 MED ORDER — OXYCODONE HCL 5 MG PO TABS
5.0000 mg | ORAL_TABLET | ORAL | Status: DC | PRN
  Administered 2018-06-07 – 2018-06-08 (×3): 10 mg via ORAL
  Filled 2018-06-07 (×3): qty 2

## 2018-06-07 MED ORDER — ONDANSETRON HCL 4 MG/2ML IJ SOLN
INTRAMUSCULAR | Status: AC
  Filled 2018-06-07: qty 2

## 2018-06-07 MED ORDER — LIDOCAINE 2% (20 MG/ML) 5 ML SYRINGE
INTRAMUSCULAR | Status: DC | PRN
  Administered 2018-06-07: 100 mg via INTRAVENOUS

## 2018-06-07 MED ORDER — MEPERIDINE HCL 50 MG/ML IJ SOLN
6.2500 mg | INTRAMUSCULAR | Status: DC | PRN
  Administered 2018-06-07: 6.25 mg via INTRAVENOUS

## 2018-06-07 MED ORDER — SODIUM CHLORIDE 0.9 % IV SOLN
INTRAVENOUS | Status: AC
  Filled 2018-06-07: qty 500000

## 2018-06-07 MED ORDER — SODIUM CHLORIDE 0.9 % IV SOLN
INTRAVENOUS | Status: DC | PRN
  Administered 2018-06-07: 25 ug/min via INTRAVENOUS

## 2018-06-07 MED ORDER — DEXAMETHASONE SODIUM PHOSPHATE 10 MG/ML IJ SOLN
INTRAMUSCULAR | Status: AC
  Filled 2018-06-07: qty 1

## 2018-06-07 MED ORDER — METOCLOPRAMIDE HCL 5 MG/ML IJ SOLN: 10.0000 mg | Freq: Once | INTRAMUSCULAR | Status: DC | PRN

## 2018-06-07 MED ORDER — ROCURONIUM BROMIDE 10 MG/ML (PF) SYRINGE
PREFILLED_SYRINGE | INTRAVENOUS | Status: DC | PRN
  Administered 2018-06-07: 50 mg via INTRAVENOUS

## 2018-06-07 SURGICAL SUPPLY — 57 items
ADH SKN CLS APL DERMABOND .7 (GAUZE/BANDAGES/DRESSINGS) ×1
BAG DECANTER FOR FLEXI CONT (MISCELLANEOUS) ×3 IMPLANT
BLADE CLIPPER SURG (BLADE) ×2 IMPLANT
BLADE SURG 10 STRL SS (BLADE) ×3 IMPLANT
BLADE SURG 15 STRL LF DISP TIS (BLADE) ×1 IMPLANT
BLADE SURG 15 STRL SS (BLADE) ×3
CANISTER SUCT 3000ML PPV (MISCELLANEOUS) IMPLANT
CHLORAPREP W/TINT 26ML (MISCELLANEOUS) ×3 IMPLANT
CLEANER TIP ELECTROSURG 2X2 (MISCELLANEOUS) ×3 IMPLANT
CLOSURE WOUND 1/2 X4 (GAUZE/BANDAGES/DRESSINGS) ×1
COVER SURGICAL LIGHT HANDLE (MISCELLANEOUS) ×3 IMPLANT
DERMABOND ADVANCED (GAUZE/BANDAGES/DRESSINGS) ×2
DERMABOND ADVANCED .7 DNX12 (GAUZE/BANDAGES/DRESSINGS) ×1 IMPLANT
DRAIN PENROSE 1/2X12 LTX STRL (WOUND CARE) ×2 IMPLANT
DRAPE LAPAROTOMY TRNSV 102X78 (DRAPE) ×3 IMPLANT
DRAPE UTILITY XL STRL (DRAPES) ×3 IMPLANT
DRSG TEGADERM 4X4.75 (GAUZE/BANDAGES/DRESSINGS) ×3 IMPLANT
ELECT REM PT RETURN 9FT ADLT (ELECTROSURGICAL) ×3
ELECTRODE REM PT RTRN 9FT ADLT (ELECTROSURGICAL) ×1 IMPLANT
GAUZE 4X4 16PLY RFD (DISPOSABLE) ×1 IMPLANT
GLOVE BIO SURGEON STRL SZ8 (GLOVE) ×4 IMPLANT
GLOVE BIOGEL PI IND STRL 8 (GLOVE) ×1 IMPLANT
GLOVE BIOGEL PI INDICATOR 8 (GLOVE) ×6
GLOVE ECLIPSE 7.5 STRL STRAW (GLOVE) ×3 IMPLANT
GOWN STRL REUS W/ TWL LRG LVL3 (GOWN DISPOSABLE) ×2 IMPLANT
GOWN STRL REUS W/ TWL XL LVL3 (GOWN DISPOSABLE) IMPLANT
GOWN STRL REUS W/TWL LRG LVL3 (GOWN DISPOSABLE) ×3
GOWN STRL REUS W/TWL XL LVL3 (GOWN DISPOSABLE) ×6
KIT BASIN OR (CUSTOM PROCEDURE TRAY) ×3 IMPLANT
KIT TURNOVER KIT B (KITS) ×3 IMPLANT
MESH HERNIA 3X6 (Mesh General) ×2 IMPLANT
NDL HYPO 25GX1X1/2 BEV (NEEDLE) ×1 IMPLANT
NEEDLE HYPO 25GX1X1/2 BEV (NEEDLE) ×3 IMPLANT
NS IRRIG 1000ML POUR BTL (IV SOLUTION) ×3 IMPLANT
PACK SURGICAL SETUP 50X90 (CUSTOM PROCEDURE TRAY) ×3 IMPLANT
PAD ARMBOARD 7.5X6 YLW CONV (MISCELLANEOUS) ×3 IMPLANT
PENCIL BUTTON HOLSTER BLD 10FT (ELECTRODE) ×3 IMPLANT
PENCIL SMOKE EVACUATOR (MISCELLANEOUS) ×2 IMPLANT
SPECIMEN JAR SMALL (MISCELLANEOUS) IMPLANT
SPONGE INTESTINAL PEANUT (DISPOSABLE) ×3 IMPLANT
SPONGE LAP 18X18 X RAY DECT (DISPOSABLE) ×3 IMPLANT
STRIP CLOSURE SKIN 1/2X4 (GAUZE/BANDAGES/DRESSINGS) ×2 IMPLANT
SUT ETHIBOND 0 MO6 C/R (SUTURE) ×3 IMPLANT
SUT MON AB 4-0 PC3 18 (SUTURE) ×3 IMPLANT
SUT PROLENE 0 CT 2 (SUTURE) ×6 IMPLANT
SUT VIC AB 2-0 CT1 27 (SUTURE) ×3
SUT VIC AB 2-0 CT1 TAPERPNT 27 (SUTURE) IMPLANT
SUT VIC AB 3-0 SH 27 (SUTURE) ×6
SUT VIC AB 3-0 SH 27X BRD (SUTURE) ×2 IMPLANT
SUT VICRYL AB 3 0 TIES (SUTURE) ×3 IMPLANT
SYR BULB 3OZ (MISCELLANEOUS) ×3 IMPLANT
SYR CONTROL 10ML LL (SYRINGE) ×3 IMPLANT
TOWEL OR 17X24 6PK STRL BLUE (TOWEL DISPOSABLE) ×3 IMPLANT
TOWEL OR 17X26 10 PK STRL BLUE (TOWEL DISPOSABLE) ×3 IMPLANT
TUBE CONNECTING 12'X1/4 (SUCTIONS) ×1
TUBE CONNECTING 12X1/4 (SUCTIONS) ×1 IMPLANT
YANKAUER SUCT BULB TIP NO VENT (SUCTIONS) ×2 IMPLANT

## 2018-06-07 NOTE — Op Note (Signed)
OPERATIVE REPORT  DATE OF OPERATION:  06/07/2018  PATIENT:  Steve Durham  72 y.o. male  PRE-OPERATIVE DIAGNOSIS:  INCARCERATED LEFT INGUINAL HERNIA  POST-OPERATIVE DIAGNOSIS:  INCARCERATED LEFT INGUINAL HERNIA  INDICATION(S) FOR OPERATION: Incarcerated left inguinal hernia with an apparent previous repair.  FINDINGS: Incarcerated but easily reduced direct left inguinal hernia.  No evidence of previous repair.  PROCEDURE:  Procedure(s): LEFT INGUINAL HERNIA REPAIR INSERTION OF MESH  SURGEON:  Surgeon(s): [name], MD  ASSISTANT: Focht, PA-C  ANESTHESIA:   general  COMPLICATIONS: None  EBL: 20 ml  BLOOD ADMINISTERED: none  DRAINS: none   SPECIMEN:  No Specimen  COUNTS CORRECT:  YES  PROCEDURE DETAILS: The patient was taken to the operating room and placed on table in supine position.  After an adequate general endotracheal anesthetic was administered, he was prepped and draped in the usual sterile manner exposing his left inguinal area which had been marked preoperatively.  A proper timeout was performed identifying the patient and the procedure to be performed.  We marked the site for the incision at the pubic tubercle and the anterior superior iliac spine.  We then made a generous 8 cm transverse curvilinear incision at the level of the superficial ring and dissected down into the subcutaneous tissue using electrocautery.  We exposed the superficial ring which had a bulging tense hernia in place but promptly reduced after we opened up the external oblique fascia exposing the spermatic cord and the protruding hernia.  The spermatic cord was easily isolated away from the hernia defect which was medial to the cord.  Upon manipulating the hernia itself is reduced into the peritoneal cavity with minimal difficulty.  There was a large sac which we subsequently opened and dissected free.  Inside the sac that was supplemented but it also reduced down to the base where there was  a tethering of the left colon into the sac which we were able to take away by detaching units of adhesions to the sidewall.  We then resected the most of the sac and then closed the defect using running 2-0 Vicryl suture.  Care was taken to internally watch as we placed the stitches not to entrap any of the colon that had previously been part of the sac.  We reduce the hernia below the fascial defect then we subsequently implanted an oval piece of polypropylene mesh measuring approximately 5 x 3 cm in size attaching it to the pubic tubercle, the reflected portion of the inguinal ligament inferolaterally, and the conjoined tendon anterior medially using running 0 Prolene suture.  We closed off the internal ring though with the Prolene suture and the mesh.  We subsequently allowed the cord to fall back into the canal and then reapproximated the external oblique fascia on top of the cord.  The mesh been soaked in antibiotic solution and we irrigated the wound with antibiotic solution.  Once we closed the external oblique fascia we reapproximated Scarpa's fascia using interrupted 3-0 Vicryl suture.  We injected 0.25% Marcaine into the subcutaneous tissue and the skin was closed using a running subcuticular stitch of 3-0 Monocryl.  All needle counts, sponge counts, and instrument counts were correct.  PATIENT DISPOSITION:  PACU - hemodynamically stable.   [name] 8/21/201910:09 AM

## 2018-06-07 NOTE — Anesthesia Preprocedure Evaluation (Signed)
Anesthesia Evaluation  Patient identified by MRN, date of birth, ID band Patient awake    Reviewed: Allergy & Precautions, NPO status , Patient's Chart, lab work & pertinent test results  Airway Mallampati: II  TM Distance: >3 FB Neck ROM: Full    Dental no notable dental hx.    Pulmonary COPD, Current Smoker,    Pulmonary exam normal breath sounds clear to auscultation       Cardiovascular hypertension, + CAD, + Past MI, + Cardiac Stents and + CABG  Normal cardiovascular exam+ Valvular Problems/Murmurs (moderate AS) AS  Rhythm:Regular Rate:Normal     Neuro/Psych negative neurological ROS  negative psych ROS   GI/Hepatic negative GI ROS, Neg liver ROS,   Endo/Other  negative endocrine ROS  Renal/GU negative Renal ROS  negative genitourinary   Musculoskeletal negative musculoskeletal ROS (+)   Abdominal   Peds negative pediatric ROS (+)  Hematology negative hematology ROS (+)   Anesthesia Other Findings   Reproductive/Obstetrics negative OB ROS                             Anesthesia Physical Anesthesia Plan  ASA: III  Anesthesia Plan: General   Post-op Pain Management:  Regional for Post-op pain   Induction: Intravenous  PONV Risk Score and Plan:   Airway Management Planned: LMA and Oral ETT  Additional Equipment:   Intra-op Plan:   Post-operative Plan: Extubation in OR  Informed Consent: I have reviewed the patients History and Physical, chart, labs and discussed the procedure including the risks, benefits and alternatives for the proposed anesthesia with the patient or authorized representative who has indicated his/her understanding and acceptance.   Dental advisory given  Plan Discussed with: CRNA  Anesthesia Plan Comments:         Anesthesia Quick Evaluation

## 2018-06-07 NOTE — Progress Notes (Signed)
PROGRESS NOTE  Steve Durham OVF:[id number] DOB: Sep 30, 1946 DOA: 06/05/2018 PCP: Townsend Roger, MD  HPI/Brief Narrative  Steve Durham is a 72 y.o. year old male with medical history significant for CAD with multiple stents, COPD, ongoing tobacco abuse, HTN, ischemic cardia myopathy who presented on 06/05/2018 as directed by his primary care doctor for concerns of incarcerated left inguinal hernia.  Has a history of prior hernia repair many years ago noticed an recurrent bulge approximately 6 months ago.  Patient planing of abdominal pain with nausea and vomiting.  Patient was admitted to medicine service given previous cardiac history to obtain preoperative clearance prior to surgery.  Cardiology consulted for preoperative cardiac clearance.  Patient with recent right left heart cath and TTE in June.  Deemed stable from CV standpoint with no recent cardiac events.  Cardiology cleared and advised to hold Plavix   Subjective Doing fine. No abdominal pain. No cough  Assessment/Plan:  1. Incarcerated left inguinal hernia, status post open repair on 06/07/2018.  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  Plavix held prior to surgery.  Pain control prior surgery: IV morphine 2 mg every 3 hours as needed severe pain, oxycodone IR 5-10 mg every 4 hours as needed moderate to severe pain.  2. CAD status post CABG with multiple coronary stents, moderate aortic stenosis.  Cleared by cardiology, can resume Plavix after surgery, he has outpatient follow-up scheduled with Dr. Angelena Form  3. Moderate COPD.  Active tobacco use.  Not on any home oxygen.  Pulmonary toilet with incentive spirometry, flutter valve, Spiriva and Dulera.  SPO2 greater than 92% goal of incidental abdominal aorta bilateral iliac aneurysm.  Will need vascular follow-up within 2 to 3 weeks on discharge.  Resume Plavix when  possible, continue beta-blocker.  4. HLD, stable continue home statin  5. Hypertension, stable continue Norvasc, diuretics, beta-blocker, Imdur.  6. GERD, stable continue PPI  7. CKD, stage III.  Stable at baseline creatinine 1.2-1.4.  Monitor BMP.  8. Hypokalemia, resolved with supplementation  Code Status: Full code  Family Communication: none at bedside   Disposition Plan: anticipate d/c in 24 hours if site remains stable after surgery . Will need to resume home plavix   Consultants:  General surgery     Procedures:  Operative repair for incarcerated left inguinal hernia, 06/07/2018  Antimicrobials: Anti-infectives (From admission, onward)   Start     Dose/Rate Route Frequency Ordered Stop   06/07/18 0939  polymyxin B 500,000 Units, bacitracin 50,000 Units in sodium chloride 0.9 % 500 mL irrigation  Status:  Discontinued       As needed 06/07/18 0939 06/07/18 1018   06/07/18 0600  ceFAZolin (ANCEF) IVPB 2g/100 mL premix     2 g 200 mL/hr over 30 Minutes Intravenous On call to O.R. 06/06/18 1550 06/07/18 0852   06/06/18 1100  amoxicillin-clavulanate (AUGMENTIN) 500-125 MG per tablet 500 mg     1 tablet Oral Every 8 hours 06/06/18 1011     06/06/18 0600  ciprofloxacin (CIPRO) IVPB 400 mg  Status:  Discontinued     400 mg 200 mL/hr over 60 Minutes Intravenous Every 12 hours 06/06/18 0130 06/06/18 1011   06/06/18 0200  metroNIDAZOLE (FLAGYL) IVPB 500 mg  Status:  Discontinued     500 mg 100 mL/hr over 60 Minutes Intravenous Every 8 hours 06/06/18 0130 06/06/18 1011   06/05/18 2300  Ampicillin-Sulbactam (UNASYN) 3  g in sodium chloride 0.9 % 100 mL IVPB     3 g 200 mL/hr over 30 Minutes Intravenous  Once 06/05/18 2259 06/06/18 0057         Cultures:  None  Telemetry: No  DVT prophylaxis: Heparin   Objective: Vitals:   06/07/18 1152 06/07/18 1207 06/07/18 1253 06/07/18 1524  BP: (!) 159/81 (!) 165/81 138/72 (!) 152/70  Pulse: 80 78 80 70  Resp: (!) 22  20 18 18   Temp: (!) 97.2 F (36.2 C)  98.1 F (36.7 C) 98.4 F (36.9 C)  TempSrc:   Oral Oral  SpO2: 97% 96% 94% 94%    Intake/Output Summary (Last 24 hours) at 06/07/2018 1728 Last data filed at 06/07/2018 1500 Gross per 24 hour  Intake 1480 ml  Output 670 ml  Net 810 ml   There were no vitals filed for this visit.  Exam:  Constitutional:normal appearing male Eyes: EOMI, anicteric, normal conjunctivae ENMT: Oropharynx with moist mucous membranes, normal dentition Cardiovascular: RRR, SEM in RUSB, with no peripheral edema Respiratory: Normal respiratory effort on room air, clear breath sounds  Abdomen: Soft,non-tender, with no HSM Skin: incisional wound in left lower abdomen, c/d/i Neurologic: Grossly no focal neuro deficit. Psychiatric:Appropriate affect, and mood. Mental status AAOx3  Data Reviewed: CBC: Recent Labs  Lab 06/05/18 1720 06/06/18 0634 06/07/18 0440  WBC 11.2* 8.9 9.7  NEUTROABS 8.1*  --   --   HGB 13.9 13.0 13.9  HCT 43.3 41.1 43.1  MCV 101.6* 101.5* 101.7*  PLT 251 210 389   Basic Metabolic Panel: Recent Labs  Lab 06/05/18 1720 06/06/18 0634 06/07/18 0440  NA 138 139 139  K 4.3 3.5 4.1  CL 104 107 106  CO2 21* 25 26  GLUCOSE 87 113* 95  BUN 18 16 10   CREATININE 1.36* 1.38* 1.30*  CALCIUM 9.2 8.6* 8.7*  MG  --   --  2.0   GFR: Estimated Creatinine Clearance: 43.4 mL/min (A) (by C-G formula based on SCr of 1.3 mg/dL (H)). Liver Function Tests: Recent Labs  Lab 06/05/18 1720 06/06/18 0634  AST 29 21  ALT 21 19  ALKPHOS 68 55  BILITOT 1.0 1.1  PROT 7.1 6.3*  ALBUMIN 3.9 3.4*   Recent Labs  Lab 06/05/18 1720  LIPASE 44   No results for input(s): AMMONIA in the last 168 hours. Coagulation Profile: Recent Labs  Lab 06/07/18 0440  INR 1.01   Cardiac Enzymes: No results for input(s): CKTOTAL, CKMB, CKMBINDEX, TROPONINI in the last 168 hours. BNP (last 3 results) No results for input(s): PROBNP in the last 8760  hours. HbA1C: No results for input(s): HGBA1C in the last 72 hours. CBG: No results for input(s): GLUCAP in the last 168 hours. Lipid Profile: No results for input(s): CHOL, HDL, LDLCALC, TRIG, CHOLHDL, LDLDIRECT in the last 72 hours. Thyroid Function Tests: No results for input(s): TSH, T4TOTAL, FREET4, T3FREE, THYROIDAB in the last 72 hours. Anemia Panel: No results for input(s): VITAMINB12, FOLATE, FERRITIN, TIBC, IRON, RETICCTPCT in the last 72 hours. Urine analysis:    Component Value Date/Time   COLORURINE YELLOW 06/05/2018 1721   APPEARANCEUR CLEAR 06/05/2018 1721   LABSPEC 1.011 06/05/2018 1721   PHURINE 5.0 06/05/2018 1721   GLUCOSEU NEGATIVE 06/05/2018 1721   HGBUR SMALL (A) 06/05/2018 1721   BILIRUBINUR NEGATIVE 06/05/2018 1721   KETONESUR NEGATIVE 06/05/2018 1721   PROTEINUR NEGATIVE 06/05/2018 1721   NITRITE NEGATIVE 06/05/2018 1721   LEUKOCYTESUR NEGATIVE 06/05/2018 1721  Sepsis Labs: @LABRCNTIP (procalcitonin:4,lacticidven:4)  ) Recent Results (from the past 240 hour(s))  Surgical pcr screen     Status: Abnormal   Collection Time: 06/06/18  1:49 AM  Result Value Ref Range Status   MRSA, PCR NEGATIVE NEGATIVE Final   Staphylococcus aureus POSITIVE (A) NEGATIVE Final    Comment: (NOTE) The Xpert SA Assay (FDA approved for NASAL specimens in patients 79 years of age and older), is one component of a comprehensive surveillance program. It is not intended to diagnose infection nor to guide or monitor treatment. Performed at [facility], [place] [address] [place], [place] [address]       Studies: No results found.  Scheduled Meds: . allopurinol  100 mg Oral Daily  . amLODipine  5 mg Oral Daily  . amoxicillin-clavulanate  1 tablet Oral Q8H  . atorvastatin  80 mg Oral q1800  . bisoprolol  2.5 mg Oral BID  . Chlorhexidine Gluconate Cloth  6 each Topical Daily  . fentaNYL      . furosemide  40 mg Oral BID  . [START ON 06/08/2018] heparin  5,000  Units Subcutaneous Q8H  . isosorbide mononitrate  60 mg Oral BID  . loratadine  10 mg Oral Daily  . meperidine      . mupirocin ointment  1 application Nasal BID  . nicotine  21 mg Transdermal Daily  . pantoprazole  40 mg Oral Daily  . spironolactone  12.5 mg Oral Daily  . tiotropium  18 mcg Inhalation Daily    Continuous Infusions:   LOS: 2 days     Desiree Hane, MD [facility] Pager [phone]  If 7PM-7AM, please contact night-coverage [email or web address] Password [facility] 06/07/2018, 5:28 PM

## 2018-06-07 NOTE — Plan of Care (Signed)
  Problem: Education: Goal: Knowledge of General Education information will improve Description: Including pain rating scale, medication(s)/side effects and non-pharmacologic comfort measures Outcome: Progressing   Problem: Safety: Goal: Ability to remain free from injury will improve Outcome: Progressing   Problem: Skin Integrity: Goal: Risk for impaired skin integrity will decrease Outcome: Progressing   

## 2018-06-07 NOTE — Anesthesia Procedure Notes (Signed)
Procedure Name: Intubation Date/Time: 06/07/2018 8:46 AM Performed by: Lowella Dell, CRNA Pre-anesthesia Checklist: Patient identified, Emergency Drugs available, Suction available and Patient being monitored Patient Re-evaluated:Patient Re-evaluated prior to induction Oxygen Delivery Method: Circle System Utilized Preoxygenation: Pre-oxygenation with 100% oxygen Induction Type: IV induction Ventilation: Mask ventilation without difficulty and Oral airway inserted - appropriate to patient size Laryngoscope Size: Mac and 4 Grade View: Grade I Tube type: Oral Tube size: 7.5 mm Number of attempts: 1 Airway Equipment and Method: Stylet Placement Confirmation: ETT inserted through vocal cords under direct vision,  positive ETCO2 and breath sounds checked- equal and bilateral Secured at: 18.5 cm Tube secured with: Tape Dental Injury: Teeth and Oropharynx as per pre-operative assessment

## 2018-06-07 NOTE — Transfer of Care (Signed)
Immediate Anesthesia Transfer of Care Note  Patient: Steve Durham  Procedure(s) Performed: LEFT INGUINAL HERNIA REPAIR (Left Groin) INSERTION OF MESH (Left Groin)  Patient Location: PACU  Anesthesia Type:General  Level of Consciousness: awake and patient cooperative  Airway & Oxygen Therapy: Patient Spontanous Breathing and Patient connected to nasal cannula oxygen  Post-op Assessment: Report given to RN, Post -op Vital signs reviewed and stable and Patient moving all extremities  Post vital signs: Reviewed and stable  Last Vitals:  Vitals Value Taken Time  BP    Temp    Pulse 77 06/07/2018 10:22 AM  Resp 23 06/07/2018 10:22 AM  SpO2 94 % 06/07/2018 10:22 AM  Vitals shown include unvalidated device data.  Last Pain:  Vitals:   06/07/18 0427  TempSrc: Oral  PainSc:          Complications: No apparent anesthesia complications

## 2018-06-07 NOTE — Anesthesia Procedure Notes (Signed)
Anesthesia Regional Block: TAP block   Pre-Anesthetic Checklist: ,, timeout performed, Correct Patient, Correct Site, Correct Laterality, Correct Procedure, Correct Position, site marked, Risks and benefits discussed,  Surgical consent,  Pre-op evaluation,  At surgeon's request and post-op pain management  Laterality: Left  Prep: Maximum Sterile Barrier Precautions used, chloraprep       Needles:  Injection technique: Single-shot  Needle Type: Echogenic Stimulator Needle     Needle Length: 10cm      Additional Needles:   Procedures:,,,, ultrasound used (permanent image in chart),,,,  Narrative:  Start time: 06/07/2018 8:02 AM End time: 06/07/2018 8:12 AM Injection made incrementally with aspirations every 5 mL.  Performed by: Personally  Anesthesiologist: [name], MD  Additional Notes: Risks, benefits and alternative to block explained extensively.  Patient tolerated procedure well, without complications.

## 2018-06-07 NOTE — Interval H&P Note (Signed)
History and Physical Interval Note:  Because of the incarceration, will go ahead and perform open repair with or withur mesh.  [name]. [name], MD, [place] [phone] [facility] 06/07/2018 8:40 AM  Steve Durham  has presented today for surgery, with the diagnosis of INCARCERATED LEFT INGUINAL HERNIA  The various methods of treatment have been discussed with the patient and family. After consideration of risks, benefits and other options for treatment, the patient has consented to  Procedure(s): LEFT INGUINAL HERNIA REPAIR (Left) as a surgical intervention .  The patient's history has been reviewed, patient examined, no change in status, stable for surgery.  I have reviewed the patient's chart and labs.  Questions were answered to the patient's satisfaction.     [name]

## 2018-06-08 ENCOUNTER — Encounter (HOSPITAL_COMMUNITY): Payer: Self-pay | Admitting: General Surgery

## 2018-06-08 DIAGNOSIS — E7800 Pure hypercholesterolemia, unspecified: Secondary | ICD-10-CM

## 2018-06-08 MED ORDER — ACETAMINOPHEN 500 MG PO TABS: 1000.0000 mg | ORAL_TABLET | Freq: Four times a day (QID) | ORAL | 0 refills | Status: DC | PRN

## 2018-06-08 MED ORDER — OXYCODONE HCL 5 MG PO TABS: 5.0000 mg | ORAL_TABLET | ORAL | 0 refills | Status: DC | PRN

## 2018-06-08 MED ORDER — CLOPIDOGREL BISULFATE 75 MG PO TABS
75.0000 mg | ORAL_TABLET | Freq: Every day | ORAL | Status: DC
  Administered 2018-06-08: 75 mg via ORAL
  Filled 2018-06-08: qty 1

## 2018-06-08 NOTE — Care Management Important Message (Signed)
Important Message  Patient Details  Name: Steve Durham MRN: [id number] Date of Birth: May 27, 1946   Medicare Important Message Given:  Yes    Ella Bodo, RN 06/08/2018, 12:07 PM

## 2018-06-08 NOTE — Progress Notes (Signed)
Discharge home. Home discharge instruction given, no question verbalized. 

## 2018-06-08 NOTE — Consult Note (Signed)
[facility] Primary Care Navigator  06/08/2018  Steve Durham 1946-08-20 [id number]   Met withpatientat the bedsideto identify possible discharge needs. Patientreports thathe had "major abdominal cramping/ pain" and was directed by his primary care doctor to the hospital for concerns of incarcerated left inguinal hernia that resulted to this admission/ surgery. (left inguinal hernia repair with mesh insertion)   PatientendorsesDr.Jason Nona Durham with [facility] as hisprimary care provider.   [facility] [place] to obtainmedications withoutdifficulty.  Patient reportsmanaginghisownmedications at homestraight out of the containers.  Hestatesthathe hasbeendrivingprior to admission/ surgery but wife(Steve Durham) will providetransportationtohis doctors' appointments after discharge.   Patient reports that his wifewill be the primary caregiver at home.  Anticipated discharge plan ishome per patient.  Patient voiced understanding to call primary care provider'soffice whenhe returns home for a post discharge follow-up appointment within1- 2 weeksor sooner if needs arise.Patient letter (with PCP's contact number) was provided asareminder.  Explained topatientregardingTHN CM services available for health managementandresourcesat home,buthe denied any needs or concerns at this point.Patient states that he is managinghis health issues at home with wife'sassistance.Patientdeclined THN-CM services offeredwhich includes EMMIcalls to follow-up with hisrecovery at home.  Patient expressed awareness to seekreferral to [facility] care management from primary care providerifdeemed necessary and appropriate foranyservicesin thenear future.  He was in much hurry to be discharge home.  [facility] care management information provided for future needs that he may have.    For additional questions please  contact:  Steve Durham, BSN, RN-BC [facility] PRIMARY CARE Navigator Cell: [phone]

## 2018-06-08 NOTE — Plan of Care (Signed)
  Problem: Activity: Goal: Risk for activity intolerance will decrease Outcome: Progressing   Problem: Nutrition: Goal: Adequate nutrition will be maintained Outcome: Progressing   Problem: Pain Managment: Goal: General experience of comfort will improve Outcome: Progressing   Problem: Clinical Measurements: Goal: Postoperative complications will be avoided or minimized Outcome: Progressing   

## 2018-06-08 NOTE — Anesthesia Postprocedure Evaluation (Signed)
Anesthesia Post Note  Patient: Steve Durham  Procedure(s) Performed: LEFT INGUINAL HERNIA REPAIR (Left Groin) INSERTION OF MESH (Left Groin)     Patient location during evaluation: PACU Anesthesia Type: General Level of consciousness: awake and alert Pain management: pain level controlled Vital Signs Assessment: post-procedure vital signs reviewed and stable Respiratory status: spontaneous breathing, nonlabored ventilation, respiratory function stable and patient connected to nasal cannula oxygen Cardiovascular status: blood pressure returned to baseline and stable Postop Assessment: no apparent nausea or vomiting Anesthetic complications: no    Last Vitals:  Vitals:   06/07/18 2212 06/08/18 0440  BP: 116/62 (!) 141/68  Pulse: 70 64  Resp:  16  Temp:  36.8 C  SpO2:  98%    Last Pain:  Vitals:   06/08/18 0905  TempSrc:   PainSc: 2    Pain Goal:                 [name]

## 2018-06-08 NOTE — Progress Notes (Signed)
Pt refused spiriva this am. Stated he does not take it and when he did it does not work. He is requesting the order be D/C.

## 2018-06-08 NOTE — Progress Notes (Signed)
Patient ID: Steve Durham, male   DOB: August 29, 1946, 72 y.o.   MRN: [id number]    1 Day Post-Op  Subjective: Patient feels well today, but sore as expected.  Ate solid diet last night with no nausea.  Passing lots of flatus.    Objective: Vital signs in last 24 hours: Temp:  [97.2 F (36.2 C)-98.4 F (36.9 C)] 98.3 F (36.8 C) (08/22 0440) Pulse Rate:  [51-85] 64 (08/22 0440) Resp:  [9-32] 16 (08/22 0440) BP: (97-176)/(54-88) 141/68 (08/22 0440) SpO2:  [94 %-100 %] 98 % (08/22 0440) Last BM Date: 06/07/18  Intake/Output from previous day: 08/21 0701 - 08/22 0700 In: 1480 [P.O.:480; I.V.:1000] Out: 20 [Blood:20] Intake/Output this shift: No intake/output data recorded.  PE: Abd: soft, appropriately tender, +BS, ND, incision is c/d/i with some ecchymosis present.  Lab Results:  Recent Labs    06/06/18 0634 06/07/18 0440  WBC 8.9 9.7  HGB 13.0 13.9  HCT 41.1 43.1  PLT 210 219   BMET Recent Labs    06/06/18 0634 06/07/18 0440  NA 139 139  K 3.5 4.1  CL 107 106  CO2 25 26  GLUCOSE 113* 95  BUN 16 10  CREATININE 1.38* 1.30*  CALCIUM 8.6* 8.7*   PT/INR Recent Labs    06/07/18 0440  LABPROT 13.2  INR 1.01   CMP     Component Value Date/Time   NA 139 06/07/2018 0440   NA 141 03/27/2018 1301   K 4.1 06/07/2018 0440   CL 106 06/07/2018 0440   CO2 26 06/07/2018 0440   GLUCOSE 95 06/07/2018 0440   BUN 10 06/07/2018 0440   BUN 18 03/27/2018 1301   CREATININE 1.30 (H) 06/07/2018 0440   CREATININE 1.27 (H) 04/27/2016 1516   CALCIUM 8.7 (L) 06/07/2018 0440   PROT 6.3 (L) 06/06/2018 0634   PROT 6.2 03/31/2015 0905   ALBUMIN 3.4 (L) 06/06/2018 0634   ALBUMIN 3.8 03/31/2015 0905   AST 21 06/06/2018 0634   ALT 19 06/06/2018 0634   ALKPHOS 55 06/06/2018 0634   BILITOT 1.1 06/06/2018 0634   BILITOT 0.2 03/31/2015 0905   GFRNONAA 52 (L) 06/07/2018 0440   GFRAA >60 06/07/2018 0440   Lipase     Component Value Date/Time   LIPASE 44 06/05/2018 1720        Studies/Results: No results found.  Anti-infectives: Anti-infectives (From admission, onward)   Start     Dose/Rate Route Frequency Ordered Stop   06/07/18 0939  polymyxin B 500,000 Units, bacitracin 50,000 Units in sodium chloride 0.9 % 500 mL irrigation  Status:  Discontinued       As needed 06/07/18 0939 06/07/18 1018   06/07/18 0600  ceFAZolin (ANCEF) IVPB 2g/100 mL premix     2 g 200 mL/hr over 30 Minutes Intravenous On call to O.R. 06/06/18 1550 06/07/18 0852   06/06/18 1100  amoxicillin-clavulanate (AUGMENTIN) 500-125 MG per tablet 500 mg  Status:  Discontinued     1 tablet Oral Every 8 hours 06/06/18 1011 06/08/18 0800   06/06/18 0600  ciprofloxacin (CIPRO) IVPB 400 mg  Status:  Discontinued     400 mg 200 mL/hr over 60 Minutes Intravenous Every 12 hours 06/06/18 0130 06/06/18 1011   06/06/18 0200  metroNIDAZOLE (FLAGYL) IVPB 500 mg  Status:  Discontinued     500 mg 100 mL/hr over 60 Minutes Intravenous Every 8 hours 06/06/18 0130 06/06/18 1011   06/05/18 2300  Ampicillin-Sulbactam (UNASYN) 3 g in sodium chloride 0.9 %  100 mL IVPB     3 g 200 mL/hr over 30 Minutes Intravenous  Once 06/05/18 2259 06/06/18 0057       Assessment/Plan  Incarcerated left inguinal hernia, POD 1, s/p repair with mesh, Dr. Hulen Skains 8/21 -patient doing great -follow up arranged with our office -narcotic script printed for the patient and DC instructions discussed with him. -may resume his Plavix.  He states his cardiologist stopped his ASA a couple of weeks ago. -he is stable for DC home today from surgical standpoint. -no further abx therapy is warranted as he does not have diverticulitis.  augmentin stopped   FEN - regular VTE - SCDs/can resume plavix ID - augmentin stopped   LOS: 3 days    Henreitta Cea , [facility] 06/08/2018, 8:01 AM Pager: [phone]

## 2018-06-08 NOTE — Discharge Instructions (Signed)
You may ride your mower in 2 weeks.  You may drive in 1 week as long as you are not taking narcotic pain medication  [facility], [place] [phone]  OPEN ABDOMINAL SURGERY: POST OP INSTRUCTIONS  Always review your discharge instruction sheet given to you by the facility where your surgery was performed.  IF YOU HAVE DISABILITY OR FAMILY LEAVE FORMS, YOU MUST BRING THEM TO THE OFFICE FOR PROCESSING.  PLEASE DO NOT GIVE THEM TO YOUR DOCTOR.  1. A prescription for pain medication may be given to you upon discharge.  Take your pain medication as prescribed, if needed.  If narcotic pain medicine is not needed, then you may take acetaminophen (Tylenol) or ibuprofen (Advil) as needed. 2. Take your usually prescribed medications unless otherwise directed. 3. If you need a refill on your pain medication, please contact your pharmacy. They will contact our office to request authorization.  Prescriptions will not be filled after 5pm or on week-ends. 4. You should follow a light diet the first few days after arrival home, such as soup and crackers, pudding, etc.unless your doctor has advised otherwise. A high-fiber, low fat diet can be resumed as tolerated.   Be sure to include lots of fluids daily. Most patients will experience some swelling and bruising on the chest and neck area.  Ice packs will help.  Swelling and bruising can take several days to resolve 5. Most patients will experience some swelling and bruising in the area of the incision. Ice pack will help. Swelling and bruising can take several days to resolve..  6. It is common to experience some constipation if taking pain medication after surgery.  Increasing fluid intake and taking a stool softener will usually help or prevent this problem from occurring.  A mild laxative (Milk of Magnesia or Miralax) should be taken according to package directions if there are no bowel movements after 48 hours. 7.  You may have steri-strips  (small skin tapes) in place directly over the incision.  These strips should be left on the skin for 7-10 days.  If your surgeon used skin glue on the incision, you may shower in 24 hours.  The glue will flake off over the next 2-3 weeks.  Any sutures or staples will be removed at the office during your follow-up visit. You may find that a light gauze bandage over your incision may keep your staples from being rubbed or pulled. You may shower and replace the bandage daily. 8. ACTIVITIES:  You may resume regular (light) daily activities beginning the next day--such as daily self-care, walking, climbing stairs--gradually increasing activities as tolerated.  You may have sexual intercourse when it is comfortable.  Refrain from any heavy lifting or straining until approved by your doctor. a. You may drive when you no longer are taking prescription pain medication, you can comfortably wear a seatbelt, and you can safely maneuver your car and apply brakes b. Return to Work: ___________________________________ 45. You should see your doctor in the office for a follow-up appointment approximately two weeks after your surgery.  Make sure that you call for this appointment within a day or two after you arrive home to insure a convenient appointment time. OTHER INSTRUCTIONS:  _____________________________________________________________ _____________________________________________________________  WHEN TO CALL YOUR DOCTOR: 1. Fever over 101.0 2. Inability to urinate 3. Nausea and/or vomiting 4. Extreme swelling or bruising 5. Continued bleeding from incision. 6. Increased pain, redness, or drainage from the incision. 7. Difficulty  swallowing or breathing 8. Muscle cramping or spasms. 9. Numbness or tingling in hands or feet or around lips.  The clinic staff is available to answer your questions during regular business hours.  Please dont hesitate to call and ask to speak to one of the nurses if you have  concerns.  For further questions, please visit [email or web address]

## 2018-06-08 NOTE — Discharge Summary (Signed)
Discharge Summary  Steve Durham MWU:[id number] DOB: 01/30/1946  PCP: Townsend Roger, MD  Admit date: 06/05/2018 Discharge date: 06/08/2018   Time spent: < 25 minutes  Admitted From: Home Disposition: Home  Recommendations for Outpatient Follow-up:  1. Follow up with PCP in 1 to 2 weeks 2. F/u with surgery for wound check  3.     Discharge Diagnoses:  Active Hospital Problems   Diagnosis Date Noted  . Diverticulitis 06/05/2018  . Inguinal hernia with incarceration 06/05/2018  . Ischemic cardiomyopathy with EF 40-45% 03/02/2014  . Cigarette smoker 07/31/2013  . Hyperlipidemia 08/11/2012  . Coronary artery disease 07/04/2011  . Essential hypertension 02/06/2009    Resolved Hospital Problems  No resolved problems to display.    Discharge Condition: Stable   CODE STATUS:Full Code  Diet recommendation:  Heart healthy  History of present illness:  Steve Durham is a 72 y.o. year old male with medical history significant for CAD with multiple stents, COPD, ongoing tobacco abuse, HTN, ischemic cardiomyopathy who presented on 06/05/2018 as directed by his primary care doctor for concerns of incarcerated left inguinal hernia.  Has a history of prior hernia repair many years ago noticed an recurrent bulge approximately 6 months ago.  Patient complaining of abdominal pain with nausea and vomiting.  Patient was admitted to medicine service given previous cardiac history to obtain preoperative clearance prior to surgery.. 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 Hulen Skains). - Chronically incarcerated became symptomatic over the past few days prior to admission - Repair on 05/12/3663 with no complications -Briefly on Unasyn, followed by cefazolin, Augmentin, ciprofloxacin and Flagyl preoperatively, no signs of diverticulitis no need to  continue antibiotics on discharge - Resume Plavix on discharge.  Has follow-up arranged with surgical team as outpatient. -Pain control provided by surgery team on discharge.  CAD status post CABG multiple coronary stents and moderate aortic stenosis, stable -Cleared by cardiology prior to surgery -Plavix was briefly held before surgery and resumed on day of discharge without complications -has outpatient follow-up scheduled with Dr. Angelena Form   Consultations:  General surgery  Procedures/Studies: Operative repair for incarcerated left inguinal hernia, 06/07/2018 with mesh placement  Discharge Exam: BP (!) 141/68 (BP Location: Right Arm)   Pulse 64   Temp 98.3 F (36.8 C) (Oral)   Resp 16   SpO2 98%   General: Lying in bed, no apparent distress Eyes: EOMI, anicteric ENT: Oral Mucosa clear and moist Cardiovascular: regular rate and rhythm, SEM in RUSB, rubs or gallops, no edema, Respiratory: Normal respiratory effort, lungs clear to auscultation bilaterally Abdomen: soft, non-distended Skin: Horizontal incision in left lower abdomen, clean, dry, intact.  Minimal bruising surrounding Neurologic: Grossly no focal neuro deficit.Mental status AAOx3, speech normal, Psychiatric:Appropriate affect, and mood   Discharge Instructions You were cared for by a hospitalist during your hospital stay. If you have any questions about your discharge medications or the care you received while you were in the hospital after you are discharged, you can call the unit and asked to speak with the hospitalist on call if the hospitalist that took care of you is not available. Once you are discharged, your primary care physician will handle any further medical issues. Please note that NO REFILLS for any discharge medications will be authorized once you are discharged, as it is imperative that you return to  your primary care physician (or establish a relationship with a primary care physician if you do not  have one) for your aftercare needs so that they can reassess your need for medications and monitor your lab values.  Discharge Instructions    Diet - low sodium heart healthy   Complete by:  As directed    Increase activity slowly   Complete by:  As directed      Allergies as of 06/08/2018   No Known Allergies     Medication List    TAKE these medications   acetaminophen 500 MG tablet Commonly known as:  TYLENOL Take 2 tablets (1,000 mg total) by mouth every 6 (six) hours as needed.   allopurinol 100 MG tablet Commonly known as:  ZYLOPRIM Take 100 mg by mouth daily.   amLODipine 5 MG tablet Commonly known as:  NORVASC TAKE 1 TABLET BY MOUTH DAILY   atorvastatin 80 MG tablet Commonly known as:  LIPITOR TAKE ONE (1) TABLET EACH DAY What changed:  See the new instructions.   bisoprolol 5 MG tablet Commonly known as:  ZEBETA TAKE 1/2 TABLET BY MOUTH TWICE DAILY   clopidogrel 75 MG tablet Commonly known as:  PLAVIX TAKE 1 TABLET BY MOUTH DAILY   fexofenadine 180 MG tablet Commonly known as:  ALLEGRA Take 180 mg by mouth daily as needed for allergies.   furosemide 40 MG tablet Commonly known as:  LASIX TAKE ONE TABLET BY MOUTH TWICE DAILY What changed:  when to take this   ipratropium-albuterol 0.5-2.5 (3) MG/3ML Soln Commonly known as:  DUONEB Take 3 mLs by nebulization every 6 (six) hours as needed (for shortness of breath or wheezing).   isosorbide mononitrate 60 MG 24 hr tablet Commonly known as:  IMDUR Take 1 tablet (60 mg total) by mouth 2 (two) times daily.   nitroGLYCERIN 0.4 MG SL tablet Commonly known as:  NITROSTAT Place 0.4 mg under the tongue every 5 (five) minutes as needed for chest pain.   oxyCODONE 5 MG immediate release tablet Commonly known as:  Oxy IR/ROXICODONE Take 1 tablet (5 mg total) by mouth every 4 (four) hours as needed (5mg  for moderate pain, 10mg  for severe pain).   pantoprazole 40 MG tablet Commonly known as:  PROTONIX TAKE  ONE TABLET BY MOUTH ONCE DAILY   potassium chloride SA 20 MEQ tablet Commonly known as:  K-DUR,KLOR-CON TAKE ONE TABLET BY MOUTH ONCE DAILY   spironolactone 25 MG tablet Commonly known as:  ALDACTONE TAKE 1/2 TABLET BY MOUTH TWICE DAILY What changed:  when to take this      No Known Allergies Follow-up Information    [name], MD Follow up in 3 week(s).   Specialty:  General Surgery Why:  our office will call you with appointment date and time Contact information: [place] [address] [phone]            The results of significant diagnostics from this hospitalization (including imaging, microbiology, ancillary and laboratory) are listed below for reference.    Significant Diagnostic Studies: Ct Abdomen Pelvis W Contrast  Result Date: 06/05/2018 CLINICAL DATA:  Incarcerated inguinal hernia. Significant constant pain for the last 3 days. History of prior hernia repair approximately 40 years ago. EXAM: CT ABDOMEN AND PELVIS WITH CONTRAST TECHNIQUE: Multidetector CT imaging of the abdomen and pelvis was performed using the standard protocol following bolus administration of intravenous contrast. CONTRAST:  188mL ISOVUE-300 IOPAMIDOL (ISOVUE-300) INJECTION 61% COMPARISON:  None.  FINDINGS: Lower chest: No acute abnormality. Hepatobiliary: Multiple gallstones. No evidence of acute cholecystitis. No focal liver abnormality. No bile duct dilatation. Pancreas: Unremarkable. No pancreatic ductal dilatation or surrounding inflammatory changes. Spleen: Normal in size without focal abnormality. Adrenals/Urinary Tract: Bilateral renal cysts. Kidneys otherwise unremarkable without suspicious mass, stone or hydronephrosis. No ureteral or bladder calculi identified. Bladder is decompressed. Stomach/Bowel: LEFT inguinal hernia which contains the lower descending colon and upper sigmoid colon. There is at least mild thickening of the walls of the colon within the  hernia and there is fluid stranding/inflammation within the LEFT inguinal hernia sac. More proximal colon is not distended, therefore, no evidence of associated bowel obstruction. There is diverticulosis throughout the sigmoid and descending colon. Stomach appears normal, partially decompressed. No small bowel wall thickening or evidence of small bowel inflammation. Appendix is normal. Vascular/Lymphatic: Extensive atherosclerosis of the abdominal aorta. 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 There is milder aneurysmal dilatation of the proximal RIGHT common iliac artery, measuring 1.9 cm diameter. No acute appearing vascular abnormality. No enlarged lymph nodes seen in the abdomen or pelvis. Reproductive: Prostate is unremarkable. Other: No abscess collection identified. No free intraperitoneal air. Musculoskeletal: No acute or suspicious osseous finding. Degenerative changes are seen throughout the thoracolumbar spine, mild to moderate in degree. IMPRESSION: 1. LEFT inguinal hernia contains a portion of the lower descending colon and/or upper sigmoid colon. There is inflammation/fluid stranding within the LEFT inguinal hernia and at least mild thickening of the walls of the involved segment of colon. This inflammatory change may indicate some degree of intermittent incarceration, however, there is no current evidence of associated bowel obstruction (the more proximal portion of the colon is not distended). Alternatively, findings could be related to an acute diverticulitis of the involved segment of colon, as there is extensive diverticulosis throughout the descending and sigmoid colon. 2. 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 Recommend followup by ultrasound in 3 years. This  recommendation follows ACR consensus guidelines: White Paper of the ACR Incidental Findings Committee II on Vascular Findings. J Am Coll Radiol 2013; 75:643-329 3. **An incidental finding of potential clinical significance has been found. 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 Recommend nonemergent vascular surgery consultation for further workup and/or follow-up considerations.** 4. Cholelithiasis without evidence of acute cholecystitis. 5. Aortic atherosclerosis. 6. Additional chronic/incidental findings detailed above. Electronically Signed   By: Franki Cabot M.D.   On: 06/05/2018 21:11    Microbiology: Recent Results (from the past 240 hour(s))  Surgical pcr screen     Status: Abnormal   Collection Time: 06/06/18  1:49 AM  Result Value Ref Range Status   MRSA, PCR NEGATIVE NEGATIVE Final   Staphylococcus aureus POSITIVE (A) NEGATIVE Final    Comment: (NOTE) The Xpert SA Assay (FDA approved for NASAL specimens in patients 69 years of age and older), is one component of a comprehensive surveillance program. It is not intended to diagnose infection nor to guide or monitor treatment. Performed at [facility], [place] [address], [place], [place] [address]      Labs: Basic Metabolic Panel: Recent Labs  Lab 06/05/18 1720 06/06/18 0634 06/07/18 0440  NA 138 139 139  K 4.3 3.5 4.1  CL 104 107 106  CO2 21* 25 26  GLUCOSE 87 113* 95  BUN 18 16 10   CREATININE 1.36* 1.38* 1.30*  CALCIUM 9.2 8.6* 8.7*  MG  --   --  2.0   Liver Function Tests: Recent Labs  Lab 06/05/18 1720 06/06/18 0634  AST 29 21  ALT 21 19  ALKPHOS 68 55  BILITOT 1.0 1.1  PROT 7.1 6.3*  ALBUMIN 3.9 3.4*   Recent Labs  Lab 06/05/18 1720  LIPASE 44   No results for input(s): AMMONIA in the last 168 hours. CBC: Recent Labs  Lab 06/05/18 1720 06/06/18 0634  06/07/18 0440  WBC 11.2* 8.9 9.7  NEUTROABS 8.1*  --   --   HGB 13.9 13.0 13.9  HCT 43.3 41.1 43.1  MCV 101.6* 101.5* 101.7*  PLT 251 210 219   Cardiac Enzymes: No results for input(s): CKTOTAL, CKMB, CKMBINDEX, TROPONINI in the last 168 hours. BNP: BNP (last 3 results) No results for input(s): BNP in the last 8760 hours.  ProBNP (last 3 results) No results for input(s): PROBNP in the last 8760 hours.  CBG: No results for input(s): GLUCAP in the last 168 hours.     Signed:  Desiree Hane, MD [facility] 06/08/2018, 11:43 AM

## 2018-06-10 ENCOUNTER — Other Ambulatory Visit: Payer: Self-pay | Admitting: Cardiovascular Disease

## 2018-06-10 DIAGNOSIS — I25119 Atherosclerotic heart disease of native coronary artery with unspecified angina pectoris: Secondary | ICD-10-CM

## 2018-06-10 DIAGNOSIS — E785 Hyperlipidemia, unspecified: Secondary | ICD-10-CM

## 2018-06-21 ENCOUNTER — Other Ambulatory Visit: Payer: Self-pay | Admitting: Cardiovascular Disease

## 2018-06-23 DIAGNOSIS — K4090 Unilateral inguinal hernia, without obstruction or gangrene, not specified as recurrent: Secondary | ICD-10-CM | POA: Diagnosis not present

## 2018-07-26 ENCOUNTER — Encounter: Payer: Self-pay | Admitting: Pulmonary Disease

## 2018-07-26 ENCOUNTER — Ambulatory Visit (INDEPENDENT_AMBULATORY_CARE_PROVIDER_SITE_OTHER): Payer: Medicare Other | Admitting: Pulmonary Disease

## 2018-07-26 VITALS — BP 150/66 | HR 61 | Ht 65.0 in | Wt 145.2 lb

## 2018-07-26 DIAGNOSIS — R0600 Dyspnea, unspecified: Secondary | ICD-10-CM

## 2018-07-26 DIAGNOSIS — J449 Chronic obstructive pulmonary disease, unspecified: Secondary | ICD-10-CM | POA: Diagnosis not present

## 2018-07-26 DIAGNOSIS — I255 Ischemic cardiomyopathy: Secondary | ICD-10-CM

## 2018-07-26 DIAGNOSIS — Z23 Encounter for immunization: Secondary | ICD-10-CM

## 2018-07-26 MED ORDER — BUDESONIDE 180 MCG/ACT IN AEPB: 2.0000 | INHALATION_SPRAY | Freq: Two times a day (BID) | RESPIRATORY_TRACT | 0 refills | Status: DC

## 2018-07-26 MED ORDER — FORMOTEROL FUMARATE 20 MCG/2ML IN NEBU: 20.0000 ug | INHALATION_SOLUTION | Freq: Two times a day (BID) | RESPIRATORY_TRACT | 0 refills | Status: DC

## 2018-07-26 NOTE — Progress Notes (Signed)
Synopsis: Referred in 07/2018 for dyspnea; has smoked 1 ppd for > 55 years  Subjective:   PATIENT ID: Steve Durham GENDER: male DOB: October 29, 1945, MRN: [id number]   HPI  Chief Complaint  Patient presents with  . Consult    Has had difficulty breathing for 'years' and saw Dr. Melvyn Novas but felt he needs re-eval. Out of breath even at rest    This is a pleasant 72 y/o male who comes to my clinic today for evaluation of shortness of breath.  He says that he has been told in the past that he may have COPD and he is here to follow-up regarding the same.  He says that he has smoked cigarettes ever since he was a teenager at least 1 pack of cigarettes daily.  He continues to smoke now.  He had a heart attack at age 76 and has since developed congestive heart failure.  He has been followed by Dr. Darlina Guys in the cardiology clinic here in [place].  He says that he gets short of breath with minimal activity and he can no longer do activities around his house anymore.  Specifically he says that cutting the grass, basic maintenance has required him to call someone else to help him which is very unlike him.  He does not cough nor does he have mucus production.  However he has significant indigestion which she says may make his breathing worse.  He says that he has seen a gastroenterologist but often their recommendations are for medicines which interfere with his cardiology medicines.  He denies leg swelling.  He does not have problems with bronchitis or pneumonia ("I have never had either 1 of those").  He says that he has tried [employer] and Symbicort and they did not help.  Albuterol nebulized is not helped with his breathing either.  He is considering rehab.   Past Medical History:  Diagnosis Date  . Abnormal CT scan, kidney    07/2012 - multiple small cysts  . Aortic stenosis    a. Mod AS/AI by cath 07/2012.;  b.  Echo (07/31/13) is: EF 55-60%, mild aortic stenosis (mean gradient 13);  c. Echo  (5/15):  EF 40-45%, mild AS (mean 12 mmHg), mod AI  . Back pain   . Carotid artery occlusion    a. Dopplers 07/2012: no significant high grade obstruction.  . Cholelithiasis    Seen on CT 07/2012  . Coronary artery disease    a. CABG 10/1991. b. cath 07/2012 with prog dsz, turned down for re-do CABG - for med rx for now.; c. NSTEMI/PCI: DES to mLAD ext into IMA graft;  d. Inf STEMI (5/15):  LM 60-70% then 99% before LAD, pLAD occl, pCFX stent 80+% ISR, oRCA 99% then occl (L-R collats to dRCA), L-LAD ok with patent stent, S-CFX occl (old), S-RCA occl (old); PCI: LM ext into CFX with Xience Alpine DES  . Heart failure (HCC) systolic  . Hyperlipidemia   . Hyperlipoproteinemia   . Hypertension   . Ischemic cardiomyopathy    a. 2D ECHO: 08/02/2014; EF 45%; severe hypokinesis base/mid-inferolat segments and severe hypokinesis base inferior segment. Mild LVH, mild AS (may be underestimated due to LV dysfunction).  Mean gradient (S): 14 mm Hg. Peak gradient (S): 25 mm Hg. VTI ratio of LVOT to aortic valve: 0.37. Mod LA dilation. Mild RV systolic dysfxn      Family History  Problem Relation Age of Onset  . Heart attack Mother  died @ 39.  Marland Kitchen Hypertension Mother   . Hypertension Father   . Hypertension Sister   . Emphysema Brother   . Hypertension Brother   . Allergies Sister   . Stroke Neg Hx      Social History   Socioeconomic History  . Marital status: Married    Spouse name: Not on file  . Number of children: 1  . Years of education: Not on file  . Highest education level: Not on file  Occupational History  . Occupation: Plumbing    Comment: Retired in 2007  Social Needs  . Financial resource strain: Not on file  . Food insecurity:    Worry: Not on file    Inability: Not on file  . Transportation needs:    Medical: Not on file    Non-medical: Not on file  Tobacco Use  . Smoking status: Current Every Day Smoker    Packs/day: 1.00    Years: 57.00    Pack years: 57.00      Types: Cigarettes  . Smokeless tobacco: Never Used  Substance and Sexual Activity  . Alcohol use: Yes    Alcohol/week: 5.0 standard drinks    Types: 5 Cans of beer per week    Comment: daily-6 daily  . Drug use: No  . Sexual activity: Not on file  Lifestyle  . Physical activity:    Days per week: Not on file    Minutes per session: Not on file  . Stress: Not on file  Relationships  . Social connections:    Talks on phone: Not on file    Gets together: Not on file    Attends religious service: Not on file    Active member of club or organization: Not on file    Attends meetings of clubs or organizations: Not on file    Relationship status: Not on file  . Intimate partner violence:    Fear of current or ex partner: Not on file    Emotionally abused: Not on file    Physically abused: Not on file    Forced sexual activity: Not on file  Other Topics Concern  . Not on file  Social History Narrative   Lives in [place] with his family.  Does not routinely exercise.     No Known Allergies   Outpatient Medications Prior to Visit  Medication Sig Dispense Refill  . acetaminophen (TYLENOL) 500 MG tablet Take 2 tablets (1,000 mg total) by mouth every 6 (six) hours as needed. 100 tablet 0  . allopurinol (ZYLOPRIM) 100 MG tablet Take 100 mg by mouth daily.  4  . amLODipine (NORVASC) 5 MG tablet TAKE 1 TABLET BY MOUTH DAILY (Patient taking differently: Take 5 mg by mouth daily. ) 90 tablet 3  . atorvastatin (LIPITOR) 80 MG tablet TAKE 1 TABLET BY MOUTH ONCE DAILY. 90 tablet 3  . bisoprolol (ZEBETA) 5 MG tablet TAKE 1/2 TABLET BY MOUTH TWICE DAILY (Patient taking differently: Take 2.5 mg by mouth 2 (two) times daily. ) 90 tablet 3  . clopidogrel (PLAVIX) 75 MG tablet TAKE 1 TABLET BY MOUTH DAILY (Patient taking differently: Take 75 mg by mouth daily. ) 90 tablet 3  . fexofenadine (ALLEGRA) 180 MG tablet Take 180 mg by mouth daily as needed for allergies.     . furosemide (LASIX) 40 MG  tablet TAKE ONE TABLET BY MOUTH TWICE DAILY (Patient taking differently: Take 40 mg by mouth 2 (two) times daily. ) 180 tablet 3  .  ipratropium-albuterol (DUONEB) 0.5-2.5 (3) MG/3ML SOLN Take 3 mLs by nebulization every 6 (six) hours as needed (for shortness of breath or wheezing).     . isosorbide mononitrate (IMDUR) 60 MG 24 hr tablet TAKE ONE TABLET BY MOUTH TWICE DAILY 180 tablet 3  . nitroGLYCERIN (NITROSTAT) 0.4 MG SL tablet Place 0.4 mg under the tongue every 5 (five) minutes as needed for chest pain.    . pantoprazole (PROTONIX) 40 MG tablet TAKE ONE TABLET BY MOUTH ONCE DAILY (Patient taking differently: Take 40 mg by mouth daily. ) 30 tablet 11  . potassium chloride SA (K-DUR,KLOR-CON) 20 MEQ tablet TAKE ONE TABLET BY MOUTH ONCE DAILY (Patient taking differently: Take 20 mEq by mouth daily. ) 90 tablet 3  . spironolactone (ALDACTONE) 25 MG tablet TAKE 1/2 TABLET BY MOUTH TWICE DAILY (Patient taking differently: Take 12.5 mg by mouth 2 (two) times daily. ) 90 tablet 2  . oxyCODONE (OXY IR/ROXICODONE) 5 MG immediate release tablet Take 1 tablet (5 mg total) by mouth every 4 (four) hours as needed (5mg  for moderate pain, 10mg  for severe pain). (Patient not taking: Reported on 07/26/2018) 10 tablet 0   No facility-administered medications prior to visit.     Review of Systems  Constitutional: Negative for chills, fever, malaise/fatigue and weight loss.  HENT: Positive for congestion. Negative for nosebleeds, sinus pain and sore throat.   Eyes: Negative for photophobia, pain and discharge.  Respiratory: Positive for shortness of breath. Negative for cough, hemoptysis, sputum production and wheezing.   Cardiovascular: Negative for chest pain, palpitations, orthopnea and leg swelling.  Gastrointestinal: Positive for heartburn. Negative for abdominal pain, constipation, diarrhea, nausea and vomiting.  Genitourinary: Negative for dysuria, frequency, hematuria and urgency.  Musculoskeletal:  Negative for back pain, joint pain, myalgias and neck pain.  Skin: Negative for itching and rash.  Neurological: Negative for tingling, tremors, sensory change, speech change, focal weakness, seizures, weakness and headaches.  Psychiatric/Behavioral: Negative for memory loss, substance abuse and suicidal ideas. The patient is not nervous/anxious. Insomnia: RA.       Objective:  Physical Exam   Vitals:   07/26/18 1523  BP: (!) 150/66  Pulse: 61  SpO2: 99%  Weight: 145 lb 3.2 oz (65.9 kg)  Height: 5\' 5"  (1.651 m)    Gen: chronically ill appearing, no acute distress HENT: NCAT, OP clear, neck supple without masses Eyes: PERRL, EOMi Lymph: no cervical lymphadenopathy PULM: CTA B CV: RRR, systolic murmur, no JVD GI: BS+, soft, nontender, no hsm Derm: no rash or skin breakdown MSK: normal bulk and tone Neuro: A&Ox4, CN II-XII intact, strength 5/5 in all 4 extremities Psyche: normal mood and affect   CBC    Component Value Date/Time   WBC 9.7 06/07/2018 0440   RBC 4.24 06/07/2018 0440   HGB 13.9 06/07/2018 0440   HGB 14.6 03/27/2018 1301   HCT 43.1 06/07/2018 0440   HCT 43.9 03/27/2018 1301   PLT 219 06/07/2018 0440   PLT 354 03/27/2018 1301   MCV 101.7 (H) 06/07/2018 0440   MCV 98 (H) 03/27/2018 1301   MCH 32.8 06/07/2018 0440   MCHC 32.3 06/07/2018 0440   RDW 13.4 06/07/2018 0440   RDW 14.1 03/27/2018 1301   LYMPHSABS 1.7 06/05/2018 1720   MONOABS 1.2 (H) 06/05/2018 1720   EOSABS 0.2 06/05/2018 1720   BASOSABS 0.1 06/05/2018 1720     Chest imaging: 2018 CT chest images independently reviewed showing mild centrilobular emphysema bilaterally, aortic calcification, there is nonspecific nodular  changes in the periphery of the bases of his lungs with some nonspecific reticulation.  No honeycombing, no intralobular septal thickening, no pleural effusion.  Airways are patent  PFT: 2016 ratio 66%, FEV1 1.85 L 67% predicted, total lung capacity 5.42 L 87% predicted,  DLCO 16.97 mL 62% predicted July 26, 2018 spirometry ratio 69%, FEV1 1.5 L 60% predicted   Labs:  Path:  Echo:  Heart Catheterization:   Records from his hospitalization in August 2019 reviewed where he had an acute on chronic incarcerated left inguinal hernia and had open repair on August 21 by Dr. Hulen Skains.    Assessment & Plan:   Dyspnea, unspecified type - Plan: Spirometry with Graph, CANCELED: Pulmonary function test  Stage 3 severe COPD by GOLD classification ([employer]) - Plan: AMB referral to pulmonary rehabilitation  Encounter for immunization - Plan: Flu vaccine HIGH DOSE PF  COPD GOLD II/ still smoking   Discussion: Ryne comes to see me for multifactorial shortness of breath.  He has cardiac disease, heartburn, heart failure, and COPD which are all contributing.  I also believe that deconditioning is also contributed as well.  He desperately needs to quit smoking we discussed this at length today.  I think his heartburn is probably contributing to his symptoms to some degree.  The most important thing I think he can do is quit smoking and start exercising more.  Plan: COPD: Spirometry test today Trial of nebulized Perforomist twice a day Try taking Pulmicort twice a day Continue to use albuterol as needed for chest tightness wheezing or shortness of breath Flu shot today Pulmonary rehab referral Practice good hand hygiene Stay active  Cigarette smoking: Stop smoking Review the smoking cessation sheet we gave you  Gastroesophageal reflux disease: Continue taking Prilosec in the morning Take Pepcid in the evening before dinner Follow the gastroesophageal lifestyle modification sheet we gave you today  We will see you back in 3 to 4 weeks.  If you are not feeling better at that point then we will repeat a full pulmonary function test.  If worse we may consider repeating a CT scan.     Current Outpatient Medications:  .  acetaminophen (TYLENOL) 500 MG tablet,  Take 2 tablets (1,000 mg total) by mouth every 6 (six) hours as needed., Disp: 100 tablet, Rfl: 0 .  allopurinol (ZYLOPRIM) 100 MG tablet, Take 100 mg by mouth daily., Disp: , Rfl: 4 .  amLODipine (NORVASC) 5 MG tablet, TAKE 1 TABLET BY MOUTH DAILY (Patient taking differently: Take 5 mg by mouth daily. ), Disp: 90 tablet, Rfl: 3 .  atorvastatin (LIPITOR) 80 MG tablet, TAKE 1 TABLET BY MOUTH ONCE DAILY., Disp: 90 tablet, Rfl: 3 .  bisoprolol (ZEBETA) 5 MG tablet, TAKE 1/2 TABLET BY MOUTH TWICE DAILY (Patient taking differently: Take 2.5 mg by mouth 2 (two) times daily. ), Disp: 90 tablet, Rfl: 3 .  clopidogrel (PLAVIX) 75 MG tablet, TAKE 1 TABLET BY MOUTH DAILY (Patient taking differently: Take 75 mg by mouth daily. ), Disp: 90 tablet, Rfl: 3 .  fexofenadine (ALLEGRA) 180 MG tablet, Take 180 mg by mouth daily as needed for allergies. , Disp: , Rfl:  .  furosemide (LASIX) 40 MG tablet, TAKE ONE TABLET BY MOUTH TWICE DAILY (Patient taking differently: Take 40 mg by mouth 2 (two) times daily. ), Disp: 180 tablet, Rfl: 3 .  ipratropium-albuterol (DUONEB) 0.5-2.5 (3) MG/3ML SOLN, Take 3 mLs by nebulization every 6 (six) hours as needed (for shortness of breath or  wheezing). , Disp: , Rfl:  .  isosorbide mononitrate (IMDUR) 60 MG 24 hr tablet, TAKE ONE TABLET BY MOUTH TWICE DAILY, Disp: 180 tablet, Rfl: 3 .  nitroGLYCERIN (NITROSTAT) 0.4 MG SL tablet, Place 0.4 mg under the tongue every 5 (five) minutes as needed for chest pain., Disp: , Rfl:  .  pantoprazole (PROTONIX) 40 MG tablet, TAKE ONE TABLET BY MOUTH ONCE DAILY (Patient taking differently: Take 40 mg by mouth daily. ), Disp: 30 tablet, Rfl: 11 .  potassium chloride SA (K-DUR,KLOR-CON) 20 MEQ tablet, TAKE ONE TABLET BY MOUTH ONCE DAILY (Patient taking differently: Take 20 mEq by mouth daily. ), Disp: 90 tablet, Rfl: 3 .  spironolactone (ALDACTONE) 25 MG tablet, TAKE 1/2 TABLET BY MOUTH TWICE DAILY (Patient taking differently: Take 12.5 mg by mouth 2  (two) times daily. ), Disp: 90 tablet, Rfl: 2 .  budesonide (PULMICORT FLEXHALER) 180 MCG/ACT inhaler, Inhale 2 puffs into the lungs 2 (two) times daily., Disp: 1 Inhaler, Rfl: 0 .  formoterol (PERFOROMIST) 20 MCG/2ML nebulizer solution, Take 2 mLs (20 mcg total) by nebulization 2 (two) times daily., Disp: 10 mL, Rfl: 0 .  oxyCODONE (OXY IR/ROXICODONE) 5 MG immediate release tablet, Take 1 tablet (5 mg total) by mouth every 4 (four) hours as needed (5mg  for moderate pain, 10mg  for severe pain). (Patient not taking: Reported on 07/26/2018), Disp: 10 tablet, Rfl: 0

## 2018-07-26 NOTE — Patient Instructions (Signed)
COPD: Spirometry test today Trial of nebulized Perforomist twice a day Try taking Pulmicort twice a day Continue to use albuterol as needed for chest tightness wheezing or shortness of breath Flu shot today Pulmonary rehab referral Practice good hand hygiene Stay active  Cigarette smoking: Stop smoking Review the smoking cessation sheet we gave you  Gastroesophageal reflux disease: Continue taking Prilosec in the morning Take Pepcid in the evening before dinner Follow the gastroesophageal lifestyle modification sheet we gave you today  We will see you back in 3 to 4 weeks.  If you are not feeling better at that point then we will repeat a full pulmonary function test.  If worse we may consider repeating a CT scan.

## 2018-08-03 DIAGNOSIS — L821 Other seborrheic keratosis: Secondary | ICD-10-CM | POA: Diagnosis not present

## 2018-08-03 DIAGNOSIS — L570 Actinic keratosis: Secondary | ICD-10-CM | POA: Diagnosis not present

## 2018-08-03 DIAGNOSIS — L578 Other skin changes due to chronic exposure to nonionizing radiation: Secondary | ICD-10-CM | POA: Diagnosis not present

## 2018-08-14 ENCOUNTER — Other Ambulatory Visit: Payer: Self-pay | Admitting: Cardiovascular Disease

## 2018-08-21 ENCOUNTER — Encounter: Payer: Self-pay | Admitting: Pulmonary Disease

## 2018-08-21 ENCOUNTER — Ambulatory Visit (INDEPENDENT_AMBULATORY_CARE_PROVIDER_SITE_OTHER): Payer: Medicare Other | Admitting: Pulmonary Disease

## 2018-08-21 VITALS — BP 140/72 | HR 72 | Wt 144.6 lb

## 2018-08-21 DIAGNOSIS — J449 Chronic obstructive pulmonary disease, unspecified: Secondary | ICD-10-CM

## 2018-08-21 DIAGNOSIS — I255 Ischemic cardiomyopathy: Secondary | ICD-10-CM | POA: Diagnosis not present

## 2018-08-21 DIAGNOSIS — F17210 Nicotine dependence, cigarettes, uncomplicated: Secondary | ICD-10-CM | POA: Diagnosis not present

## 2018-08-21 DIAGNOSIS — K219 Gastro-esophageal reflux disease without esophagitis: Secondary | ICD-10-CM

## 2018-08-21 MED ORDER — FORMOTEROL FUMARATE 20 MCG/2ML IN NEBU: 20.0000 ug | INHALATION_SOLUTION | Freq: Two times a day (BID) | RESPIRATORY_TRACT | 5 refills | Status: DC

## 2018-08-21 MED ORDER — NICOTINE 10 MG IN INHA: 1.0000 | RESPIRATORY_TRACT | 5 refills | Status: DC | PRN

## 2018-08-21 NOTE — Patient Instructions (Signed)
COPD: Continue Perforomist twice a day, be sure that your pharmacist bills this through Medicare part B Continue taking Pulmicort twice a day Use albuterol as needed for chest tightness wheezing or shortness of breath Stay active Practice good hand hygiene Exercise regularly  Cigarette smoking: Do your best to quit smoking Use the Nicotrol inhaler we prescribed today to help replace nicotine  Gastroesophageal reflux disease: Continue Prilosec in the morning Continue Pepcid in the evenings  We will see you back in 6 months or sooner if needed

## 2018-08-21 NOTE — Progress Notes (Signed)
Synopsis: Referred in 07/2018 for dyspnea; has smoked 1 ppd for > 55 years  Subjective:   PATIENT ID: Steve Durham GENDER: male DOB: 29-Aug-1946, MRN: [id number]   HPI  Chief Complaint  Patient presents with  . Follow-up    reports improvement in breathing, smoking 1.5ppd   He is still smoking 1.5 ppd of cigarettes.    Germain says that his breathing improved while he was taking the Perforomist we prescribed him but he recently ran out and he said a bit more chest tightness and wheezing.  But in general he has been better.  He is still smoking 1/2 packs of cigarettes daily.  He desires to quit smoking but he feels that it is a behavior in a habit that it is difficult for him to shake.  He is not interested in counseling despite my prompting.  He is interested in different forms of nicotine replacement.  He says that the patch is never worked for him but he is interested in the inhaled form with the Nicotrol inhaler.  He has not had bronchitis or pneumonia since the last visit  His flu shot is up-to-date.   Past Medical History:  Diagnosis Date  . Abnormal CT scan, kidney    07/2012 - multiple small cysts  . Aortic stenosis    a. Mod AS/AI by cath 07/2012.;  b.  Echo (07/31/13) is: EF 55-60%, mild aortic stenosis (mean gradient 13);  c. Echo (5/15):  EF 40-45%, mild AS (mean 12 mmHg), mod AI  . Back pain   . Carotid artery occlusion    a. Dopplers 07/2012: no significant high grade obstruction.  . Cholelithiasis    Seen on CT 07/2012  . Coronary artery disease    a. CABG 10/1991. b. cath 07/2012 with prog dsz, turned down for re-do CABG - for med rx for now.; c. NSTEMI/PCI: DES to mLAD ext into IMA graft;  d. Inf STEMI (5/15):  LM 60-70% then 99% before LAD, pLAD occl, pCFX stent 80+% ISR, oRCA 99% then occl (L-R collats to dRCA), L-LAD ok with patent stent, S-CFX occl (old), S-RCA occl (old); PCI: LM ext into CFX with Xience Alpine DES  . Heart failure (HCC) systolic  .  Hyperlipidemia   . Hyperlipoproteinemia   . Hypertension   . Ischemic cardiomyopathy    a. 2D ECHO: 08/02/2014; EF 45%; severe hypokinesis base/mid-inferolat segments and severe hypokinesis base inferior segment. Mild LVH, mild AS (may be underestimated due to LV dysfunction).  Mean gradient (S): 14 mm Hg. Peak gradient (S): 25 mm Hg. VTI ratio of LVOT to aortic valve: 0.37. Mod LA dilation. Mild RV systolic dysfxn      Review of Systems  Constitutional: Negative for chills, fever, malaise/fatigue and weight loss.  HENT: Positive for congestion. Negative for nosebleeds, sinus pain and sore throat.   Eyes: Negative for photophobia, pain and discharge.  Respiratory: Positive for shortness of breath. Negative for cough, hemoptysis, sputum production and wheezing.   Cardiovascular: Negative for chest pain, palpitations, orthopnea and leg swelling.  Gastrointestinal: Positive for heartburn. Negative for abdominal pain, constipation, diarrhea, nausea and vomiting.  Genitourinary: Negative for dysuria, frequency, hematuria and urgency.  Musculoskeletal: Negative for back pain, joint pain, myalgias and neck pain.  Skin: Negative for itching and rash.  Neurological: Negative for tingling, tremors, sensory change, speech change, focal weakness, seizures, weakness and headaches.  Psychiatric/Behavioral: Negative for memory loss, substance abuse and suicidal ideas. The patient is not nervous/anxious. Insomnia: RA.  Objective:  Physical Exam   Vitals:   08/21/18 1347  BP: 140/72  Pulse: 72  SpO2: 99%  Weight: 144 lb 9.6 oz (65.6 kg)    Gen: chronically ill appearing HENT: OP clear, TM's clear, neck supple PULM: CTA B, normal percussion CV: RRR, systolic murmur noted, trace edema GI: BS+, soft, nontender Derm: no cyanosis or rash Psyche: normal mood and affect   CBC    Component Value Date/Time   WBC 9.7 06/07/2018 0440   RBC 4.24 06/07/2018 0440   HGB 13.9 06/07/2018 0440    HGB 14.6 03/27/2018 1301   HCT 43.1 06/07/2018 0440   HCT 43.9 03/27/2018 1301   PLT 219 06/07/2018 0440   PLT 354 03/27/2018 1301   MCV 101.7 (H) 06/07/2018 0440   MCV 98 (H) 03/27/2018 1301   MCH 32.8 06/07/2018 0440   MCHC 32.3 06/07/2018 0440   RDW 13.4 06/07/2018 0440   RDW 14.1 03/27/2018 1301   LYMPHSABS 1.7 06/05/2018 1720   MONOABS 1.2 (H) 06/05/2018 1720   EOSABS 0.2 06/05/2018 1720   BASOSABS 0.1 06/05/2018 1720     Chest imaging: 2018 CT chest images independently reviewed showing mild centrilobular emphysema bilaterally, aortic calcification, there is nonspecific nodular changes in the periphery of the bases of his lungs with some nonspecific reticulation.  No honeycombing, no intralobular septal thickening, no pleural effusion.  Airways are patent  PFT: 2016 ratio 66%, FEV1 1.85 L 67% predicted, total lung capacity 5.42 L 87% predicted, DLCO 16.97 mL 62% predicted July 26, 2018 spirometry ratio 69%, FEV1 1.5 L 60% predicted   Labs:  Path:  Echo:  Heart Catheterization:      Assessment & Plan:   Stage 3 severe COPD by GOLD classification ([place])  Cigarette smoker  Gastroesophageal reflux disease, esophagitis presence not specified  Discussion: Jourdyn had significant improvement in symptoms with the addition of Perforomist to his regimen.  He would like a prescription for this so we will make it today.  That being said, he is still smoking cigarettes and he understands per our conversation today that the best thing he could do for himself is to quit smoking.  He said that he is interested in using inhaled nicotine replacement in the form of a Nicotrol inhaler.  The nicotine patch was never effective for him when he tried it in the past.  Plan: COPD: Continue Perforomist twice a day, be sure that your pharmacist bills this through Medicare part B Continue taking Pulmicort twice a day Use albuterol as needed for chest tightness wheezing or shortness of  breath Stay active Practice good hand hygiene Exercise regularly  Cigarette smoking: Do your best to quit smoking Use the Nicotrol inhaler we prescribed today to help replace nicotine  Gastroesophageal reflux disease: Continue Prilosec in the morning Continue Pepcid in the evenings  We will see you back in 6 months or sooner if needed     Current Outpatient Medications:  .  allopurinol (ZYLOPRIM) 100 MG tablet, Take 100 mg by mouth daily., Disp: , Rfl: 4 .  amLODipine (NORVASC) 5 MG tablet, TAKE 1 TABLET BY MOUTH DAILY (Patient taking differently: Take 5 mg by mouth daily. ), Disp: 90 tablet, Rfl: 3 .  atorvastatin (LIPITOR) 80 MG tablet, TAKE 1 TABLET BY MOUTH ONCE DAILY., Disp: 90 tablet, Rfl: 3 .  bisoprolol (ZEBETA) 5 MG tablet, TAKE 1/2 TABLET BY MOUTH TWICE DAILY (Patient taking differently: Take 2.5 mg by mouth 2 (two) times daily. ), Disp: 90  tablet, Rfl: 3 .  budesonide (PULMICORT FLEXHALER) 180 MCG/ACT inhaler, Inhale 2 puffs into the lungs 2 (two) times daily., Disp: 1 Inhaler, Rfl: 0 .  clopidogrel (PLAVIX) 75 MG tablet, TAKE 1 TABLET BY MOUTH DAILY (Patient taking differently: Take 75 mg by mouth daily. ), Disp: 90 tablet, Rfl: 3 .  fexofenadine (ALLEGRA) 180 MG tablet, Take 180 mg by mouth daily as needed for allergies. , Disp: , Rfl:  .  furosemide (LASIX) 40 MG tablet, TAKE ONE TABLET BY MOUTH TWICE DAILY (Patient taking differently: Take 40 mg by mouth 2 (two) times daily. ), Disp: 180 tablet, Rfl: 3 .  ipratropium-albuterol (DUONEB) 0.5-2.5 (3) MG/3ML SOLN, Take 3 mLs by nebulization every 6 (six) hours as needed (for shortness of breath or wheezing). , Disp: , Rfl:  .  isosorbide mononitrate (IMDUR) 60 MG 24 hr tablet, TAKE ONE TABLET BY MOUTH TWICE DAILY, Disp: 180 tablet, Rfl: 3 .  nitroGLYCERIN (NITROSTAT) 0.4 MG SL tablet, Place 0.4 mg under the tongue every 5 (five) minutes as needed for chest pain., Disp: , Rfl:  .  pantoprazole (PROTONIX) 40 MG tablet, TAKE ONE  TABLET BY MOUTH ONCE DAILY, Disp: 30 tablet, Rfl: 9 .  potassium chloride SA (K-DUR,KLOR-CON) 20 MEQ tablet, TAKE ONE TABLET BY MOUTH ONCE DAILY (Patient taking differently: Take 20 mEq by mouth daily. ), Disp: 90 tablet, Rfl: 3 .  spironolactone (ALDACTONE) 25 MG tablet, TAKE 1/2 TABLET BY MOUTH TWICE DAILY (Patient taking differently: Take 12.5 mg by mouth 2 (two) times daily. ), Disp: 90 tablet, Rfl: 2 .  formoterol (PERFOROMIST) 20 MCG/2ML nebulizer solution, Take 2 mLs (20 mcg total) by nebulization 2 (two) times daily., Disp: 120 mL, Rfl: 5 .  nicotine (NICOTROL) 10 MG inhaler, Inhale 1 Cartridge (1 continuous puffing total) into the lungs as needed for smoking cessation., Disp: 42 each, Rfl: 5

## 2018-08-22 ENCOUNTER — Telehealth: Payer: Self-pay | Admitting: Pulmonary Disease

## 2018-08-22 NOTE — Telephone Encounter (Signed)
Called and spoke with Steve Durham, Steve Durham.  Patient dropped off new performomist prescription with [employer] coupon with code [phone] on top.  The code is for the medication, ICD-10 code is J44.9.  They are having problems processing the prescription through Medicare Part B.  Steve Durham stated that they are going to contact the help desk with there pharmacy and contact [place].  She will call back if there is anything further needed from [facility].

## 2018-09-05 ENCOUNTER — Other Ambulatory Visit: Payer: Self-pay | Admitting: *Deleted

## 2018-09-13 DIAGNOSIS — J329 Chronic sinusitis, unspecified: Secondary | ICD-10-CM | POA: Diagnosis not present

## 2018-09-13 DIAGNOSIS — R05 Cough: Secondary | ICD-10-CM | POA: Diagnosis not present

## 2018-09-18 ENCOUNTER — Other Ambulatory Visit: Payer: Self-pay

## 2018-09-18 MED ORDER — FORMOTEROL FUMARATE 20 MCG/2ML IN NEBU: 20.0000 ug | INHALATION_SOLUTION | Freq: Two times a day (BID) | RESPIRATORY_TRACT | 5 refills | Status: DC

## 2018-09-25 ENCOUNTER — Telehealth: Payer: Self-pay | Admitting: Pulmonary Disease

## 2018-09-25 NOTE — Telephone Encounter (Signed)
Spoke with Hoyle Sauer and advised her to let pt know that [facility] is unable dispense this medication because they do not have it in their warehouse The pharmacist advised me to have pt find another pharmacy that has it and then they can transfer the Rx wherever he would like. She stated she would relay the message to pt. Nothing further is needed.

## 2018-09-27 ENCOUNTER — Telehealth: Payer: Self-pay | Admitting: Pulmonary Disease

## 2018-09-27 NOTE — Telephone Encounter (Signed)
Rodena Piety please advise if you have received a CMN form on this patient, thank you

## 2018-09-27 NOTE — Telephone Encounter (Signed)
This form is in Dr. Anastasia Pall to do folder where Burman Nieves keeps her stuff in A pod

## 2018-09-28 NOTE — Telephone Encounter (Signed)
Per Steve Durham this has been signed and faxed

## 2018-09-29 ENCOUNTER — Telehealth: Payer: Self-pay | Admitting: Pulmonary Disease

## 2018-09-29 DIAGNOSIS — J449 Chronic obstructive pulmonary disease, unspecified: Secondary | ICD-10-CM | POA: Diagnosis not present

## 2018-09-29 DIAGNOSIS — R05 Cough: Secondary | ICD-10-CM | POA: Diagnosis not present

## 2018-09-29 NOTE — Telephone Encounter (Signed)
Called Steve Durham, unable to reach left message to give [place] a call back.

## 2018-10-02 ENCOUNTER — Other Ambulatory Visit: Payer: Self-pay | Admitting: Cardiovascular Disease

## 2018-10-03 NOTE — Telephone Encounter (Signed)
Attempted to contact [place]. I did not receive an answer. I have left a message for her to return our call.

## 2018-10-04 NOTE — Telephone Encounter (Signed)
From another encounter from 09/27/18, there is a message on there from Ronalee Belts stating that Burman Nieves had faxed the form. Nothing further needed.

## 2018-10-06 DIAGNOSIS — L570 Actinic keratosis: Secondary | ICD-10-CM | POA: Diagnosis not present

## 2018-11-20 ENCOUNTER — Other Ambulatory Visit: Payer: Self-pay | Admitting: Cardiovascular Disease

## 2018-11-24 DIAGNOSIS — J441 Chronic obstructive pulmonary disease with (acute) exacerbation: Secondary | ICD-10-CM | POA: Diagnosis not present

## 2018-11-24 DIAGNOSIS — J209 Acute bronchitis, unspecified: Secondary | ICD-10-CM | POA: Diagnosis not present

## 2018-11-24 DIAGNOSIS — R05 Cough: Secondary | ICD-10-CM | POA: Diagnosis not present

## 2018-11-29 DIAGNOSIS — J449 Chronic obstructive pulmonary disease, unspecified: Secondary | ICD-10-CM | POA: Diagnosis not present

## 2018-12-13 ENCOUNTER — Ambulatory Visit (INDEPENDENT_AMBULATORY_CARE_PROVIDER_SITE_OTHER): Payer: Medicare Other | Admitting: Cardiovascular Disease

## 2018-12-13 ENCOUNTER — Encounter (INDEPENDENT_AMBULATORY_CARE_PROVIDER_SITE_OTHER): Payer: Self-pay

## 2018-12-13 ENCOUNTER — Encounter: Payer: Self-pay | Admitting: Cardiovascular Disease

## 2018-12-13 VITALS — BP 134/60 | HR 60 | Ht 65.0 in | Wt 139.8 lb

## 2018-12-13 DIAGNOSIS — I10 Essential (primary) hypertension: Secondary | ICD-10-CM | POA: Diagnosis not present

## 2018-12-13 DIAGNOSIS — I255 Ischemic cardiomyopathy: Secondary | ICD-10-CM

## 2018-12-13 DIAGNOSIS — R0602 Shortness of breath: Secondary | ICD-10-CM

## 2018-12-13 DIAGNOSIS — I5032 Chronic diastolic (congestive) heart failure: Secondary | ICD-10-CM

## 2018-12-13 DIAGNOSIS — E7800 Pure hypercholesterolemia, unspecified: Secondary | ICD-10-CM

## 2018-12-13 DIAGNOSIS — Z720 Tobacco use: Secondary | ICD-10-CM | POA: Diagnosis not present

## 2018-12-13 DIAGNOSIS — I25118 Atherosclerotic heart disease of native coronary artery with other forms of angina pectoris: Secondary | ICD-10-CM | POA: Diagnosis not present

## 2018-12-13 NOTE — Progress Notes (Signed)
Chief Complaint  Patient presents with  . Follow-up    CAD   History of Present Illness: 73 yo male with history of CAD s/p CABG in 1993 with subsequent PCI/stenting, HTN, HLD, aortic stenosis, COPD and ongoing tobacco abuse here today for follow up. He has severe CAD. He was admitted October 2013 and cath performed with occlusion of LAD, patent IMA to LAD, occluded proximal RCA with occluded SVG to PDA, severe disease in left main into Circumflex with occluded vein graft to Circumflex. Re-do CABG suggested but his aorta was severely calcified. He was seen by Dr. Servando Snare with CT surgery and not felt to be a candidate for re-do bypass. Imdur was added. No intervention performed. He was admitted October 2014 with a NSTEMI. Cardiac cath with 70% left main stenosis, LAD occluded, mid LAD 99 after anastomosis of the graft, prox Circumflex 50% , OM stent with 70% ISR, pRCA occluded, SVG-PDA occluded, SVG-OM occluded, LIMA-LAD patent. A drug eluting stent was placed in the mid LAD extending back into the IMA graft. He was readmitted May 2015 with an inferolateral STEMI and found to have progression of disease in the left main/proximal Circumflex stent. A drug eluting stent was placed from the Circumflex back into the left main. He was readmitted October 2015 with unstable angina. Cardiac cath with stenosis left main stent and OM 1. A drug eluting stent was placed in the first OM branch. The left main stent restenosis was treated with balloon angioplasty. Repeat caths in April 2016 and June 2016 with stable disease. Despite all of his coronary interventions, he continues to smoke. He has been tried on Ranexa but stopped due to cost. Echo June 2019 with LVEF=50-55% with moderate aortic stenosis (mean gradient 20 mmHg) and moderate aortic insufficiency. Right and left heart cath at [facility] 04/04/18 showed severe 3V disease with patency of the stent in the IMA/LAD, stents left main, Circumflex and OM. The SVG to the  RCA and SVG to the OM known to be occluded. Right heart pressures were normal. There was a mild gradient acros the aortic valve. He was seen by Dr. Melvyn Novas in 2016 and not felt to have COPD. He was seen in October 2019 by Dr. Lake Bells in the pulmonary office and diagnosed with severe COPD.    He is here today for follow up. He reports progressively worsening dyspnea over the past 4 weeks. He has not called the pulmonary office. He has been using his COPD medications as prescribed. He has lost 5 lbs over the past 3 months.  No lower extremity edema or fluid retention. Chest pain occurs daily but has not worsened over the last year. The pain resolves with NTG. This is typical for him. He has no palpitations, orthopnea, PND, dizziness, near syncope or syncope. He continues to smoke.    Primary Care Physician: Townsend Roger, MD   Past Medical History:  Diagnosis Date  . Abnormal CT scan, kidney    07/2012 - multiple small cysts  . Aortic stenosis    a. Mod AS/AI by cath 07/2012.;  b.  Echo (07/31/13) is: EF 55-60%, mild aortic stenosis (mean gradient 13);  c. Echo (5/15):  EF 40-45%, mild AS (mean 12 mmHg), mod AI  . Back pain   . Carotid artery occlusion    a. Dopplers 07/2012: no significant high grade obstruction.  . Cholelithiasis    Seen on CT 07/2012  . Coronary artery disease    a. CABG 10/1991. b.  cath 07/2012 with prog dsz, turned down for re-do CABG - for med rx for now.; c. NSTEMI/PCI: DES to mLAD ext into IMA graft;  d. Inf STEMI (5/15):  LM 60-70% then 99% before LAD, pLAD occl, pCFX stent 80+% ISR, oRCA 99% then occl (L-R collats to dRCA), L-LAD ok with patent stent, S-CFX occl (old), S-RCA occl (old); PCI: LM ext into CFX with Xience Alpine DES  . Heart failure (HCC) systolic  . Hyperlipidemia   . Hyperlipoproteinemia   . Hypertension   . Ischemic cardiomyopathy    a. 2D ECHO: 08/02/2014; EF 45%; severe hypokinesis base/mid-inferolat segments and severe hypokinesis base inferior  segment. Mild LVH, mild AS (may be underestimated due to LV dysfunction).  Mean gradient (S): 14 mm Hg. Peak gradient (S): 25 mm Hg. VTI ratio of LVOT to aortic valve: 0.37. Mod LA dilation. Mild RV systolic dysfxn     Past Surgical History:  Procedure Laterality Date  . CARDIAC CATHETERIZATION  04/03/99  . CARDIAC CATHETERIZATION N/A 03/20/2015   Procedure: Left Heart Cath and Cors/Grafts Angiography;  Surgeon: Jettie Booze, MD;  Location: [place] [facility];  Service: Cardiovascular;  Laterality: N/A;  . CARDIAC CATHETERIZATION N/A 04/28/2016   Procedure: Right/Left Heart Cath and Coronary/Graft Angiography;  Surgeon: Burnell Blanks, MD;  Location: [place] [facility];  Service: Cardiovascular;  Laterality: N/A;  . CARPAL TUNNEL RELEASE  2007   Excision mass dorsal left wrist  . CORONARY ARTERY BYPASS GRAFT  1993  . FASCIOTOMY Right 07/30/2013   Procedure: OPEN FASCIOTOMY RIGHT RING FINGER & RIGHT SMALL FINGER MULTIPLE LEVELS;  Surgeon: Wynonia Sours, MD;  Location: [place];  Service: Orthopedics;  Laterality: Right;  . HERNIA REPAIR  1988  . INGUINAL HERNIA REPAIR Left 06/07/2018   Procedure: LEFT INGUINAL HERNIA REPAIR;  Surgeon: [name], MD;  Location: [place];  Service: General;  Laterality: Left;  . INSERTION OF MESH Left 06/07/2018   Procedure: INSERTION OF MESH;  Surgeon: [name], MD;  Location: [place];  Service: General;  Laterality: Left;  . LEFT HEART CATHETERIZATION WITH CORONARY ANGIOGRAM N/A 02/28/2014   Procedure: LEFT HEART CATHETERIZATION WITH CORONARY ANGIOGRAM;  Surgeon: Troy Sine, MD;  Location: [facility] CATH LAB;  Service: Cardiovascular;  Laterality: N/A;  . LEFT HEART CATHETERIZATION WITH CORONARY/GRAFT ANGIOGRAM N/A 08/10/2012   Procedure: LEFT HEART CATHETERIZATION WITH Beatrix Fetters;  Surgeon: Hillary Bow, MD;  Location: [facility] CATH LAB;  Service: Cardiovascular;  Laterality: N/A;  . LEFT HEART CATHETERIZATION WITH  CORONARY/GRAFT ANGIOGRAM N/A 08/01/2014   Procedure: LEFT HEART CATHETERIZATION WITH Beatrix Fetters;  Surgeon: Leonie Man, MD;  Location: [facility] CATH LAB;  Service: Cardiovascular;  Laterality: N/A;  . LEFT HEART CATHETERIZATION WITH CORONARY/GRAFT ANGIOGRAM N/A 02/12/2015   Procedure: LEFT HEART CATHETERIZATION WITH Beatrix Fetters;  Surgeon: Burnell Blanks, MD;  Location: [facility] CATH LAB;  Service: Cardiovascular;  Laterality: N/A;  . PERCUTANEOUS CORONARY STENT INTERVENTION (PCI-S)  07/31/2013   Procedure: PERCUTANEOUS CORONARY STENT INTERVENTION (PCI-S);  Surgeon: Burnell Blanks, MD;  Location: [facility] CATH LAB;  Service: Cardiovascular;;  LIMA to LAD at the anastomosis with aortic root shot   . RIGHT/LEFT HEART CATH AND CORONARY/GRAFT ANGIOGRAPHY N/A 04/04/2018   Procedure: RIGHT/LEFT HEART CATH AND CORONARY/GRAFT ANGIOGRAPHY;  Surgeon: Martinique, Peter M, MD;  Location: [place] [facility];  Service: Cardiovascular;  Laterality: N/A;    Current Outpatient Medications  Medication Sig Dispense Refill  . allopurinol (ZYLOPRIM) 100 MG tablet  Take 100 mg by mouth daily.  4  . amLODipine (NORVASC) 5 MG tablet TAKE 1 TABLET BY MOUTH DAILY (Patient taking differently: Take 5 mg by mouth daily. ) 90 tablet 3  . atorvastatin (LIPITOR) 80 MG tablet TAKE 1 TABLET BY MOUTH ONCE DAILY. 90 tablet 3  . bisoprolol (ZEBETA) 5 MG tablet TAKE 1/2 TABLET BY MOUTH TWICE DAILY (Patient taking differently: Take 2.5 mg by mouth 2 (two) times daily. ) 90 tablet 3  . clopidogrel (PLAVIX) 75 MG tablet TAKE 1 TABLET BY MOUTH DAILY (Patient taking differently: Take 75 mg by mouth daily. ) 90 tablet 3  . fexofenadine (ALLEGRA) 180 MG tablet Take 180 mg by mouth daily as needed for allergies.     . formoterol (PERFOROMIST) 20 MCG/2ML nebulizer solution Take 2 mLs (20 mcg total) by nebulization 2 (two) times daily. 120 mL 5  . furosemide (LASIX) 40 MG tablet TAKE ONE TABLET BY MOUTH TWICE DAILY (Patient  taking differently: Take 40 mg by mouth 2 (two) times daily. ) 180 tablet 3  . isosorbide mononitrate (IMDUR) 60 MG 24 hr tablet TAKE ONE TABLET BY MOUTH TWICE DAILY 180 tablet 3  . nitroGLYCERIN (NITROSTAT) 0.4 MG SL tablet TAKE 1 TABLET UNDER THE TONGUE AS NEEDEDFOR CHEST PAIN ( MAY REPEAT EVERY 5 MINUTES X 3) 25 tablet 3  . pantoprazole (PROTONIX) 40 MG tablet TAKE ONE TABLET BY MOUTH ONCE DAILY 30 tablet 9  . potassium chloride SA (K-DUR,KLOR-CON) 20 MEQ tablet TAKE ONE TABLET BY MOUTH ONCE DAILY (Patient taking differently: Take 20 mEq by mouth daily. ) 90 tablet 3  . spironolactone (ALDACTONE) 25 MG tablet TAKE 1/2 TABLET BY MOUTH TWICE DAILY. 90 tablet 1   No current facility-administered medications for this visit.     No Known Allergies  Social History   Socioeconomic History  . Marital status: Married    Spouse name: Not on file  . Number of children: 1  . Years of education: Not on file  . Highest education level: Not on file  Occupational History  . Occupation: Plumbing    Comment: Retired in 2007  Social Needs  . Financial resource strain: Not on file  . Food insecurity:    Worry: Not on file    Inability: Not on file  . Transportation needs:    Medical: Not on file    Non-medical: Not on file  Tobacco Use  . Smoking status: Current Every Day Smoker    Packs/day: 1.00    Years: 57.00    Pack years: 57.00    Types: Cigarettes  . Smokeless tobacco: Never Used  Substance and Sexual Activity  . Alcohol use: Yes    Alcohol/week: 5.0 standard drinks    Types: 5 Cans of beer per week    Comment: daily-6 daily  . Drug use: No  . Sexual activity: Not on file  Lifestyle  . Physical activity:    Days per week: Not on file    Minutes per session: Not on file  . Stress: Not on file  Relationships  . Social connections:    Talks on phone: Not on file    Gets together: Not on file    Attends religious service: Not on file    Active member of club or organization:  Not on file    Attends meetings of clubs or organizations: Not on file    Relationship status: Not on file  . Intimate partner violence:    Fear of  current or ex partner: Not on file    Emotionally abused: Not on file    Physically abused: Not on file    Forced sexual activity: Not on file  Other Topics Concern  . Not on file  Social History Narrative   Lives in [place] with his family.  Does not routinely exercise.    Family History  Problem Relation Age of Onset  . Heart attack Mother        died @ 58.  Marland Kitchen Hypertension Mother   . Hypertension Father   . Hypertension Sister   . Emphysema Brother   . Hypertension Brother   . Allergies Sister   . Stroke Neg Hx     Review of Systems:  As stated in the HPI and otherwise negative.   BP 134/60   Pulse 60   Ht 5\' 5"  (1.651 m)   Wt 139 lb 12.8 oz (63.4 kg)   SpO2 97%   BMI 23.26 kg/m   Physical Examination:  General: Well developed, well nourished, Dyspneic appearing. He has to catch his breath as talking.  HEENT: OP clear, mucus membranes moist  SKIN: warm, dry. No rashes. Neuro: No focal deficits  Musculoskeletal: Muscle strength 5/5 all ext  Psychiatric: Mood and affect normal  Neck: No JVD, no carotid bruits, no thyromegaly, no lymphadenopathy.  Lungs:Clear bilaterally with no wheezes or crackles. Decreased air movement both lungs.  Cardiovascular: Regular rate and rhythm. Harsh systolic murmur at LUSB.  Abdomen:Soft. Bowel sounds present. Non-tender.  Extremities: No lower extremity edema.   Cardiac Cath June 2019:    Non-stenotic Ost LM to LM lesion previously treated.  Ost LAD to Prox LAD lesion is 100% stenosed.  Ost Cx to Prox Cx lesion is 50% stenosed.  Ost RCA to Prox RCA lesion is 100% stenosed.  Previously placed Dist LAD stent (unknown type) is widely patent.  Previously placed Ost 1st Mrg stent (unknown type) is widely patent.  LV end diastolic pressure is normal.  Echo June 2019:  - Left  ventricle: The cavity size was normal. Wall thickness was   increased in a pattern of mild LVH. Systolic function was normal.   The estimated ejection fraction was in the range of 50% to 55%.   Inferoseptal hypokinesis. The study is not technically sufficient   to allow evaluation of LV diastolic function. - Aortic valve: Moderate stenosis and moderate regurgitation. Mean   gradient (S): 20 mm Hg. Peak gradient (S): 43 mm Hg. - Mitral valve: Calcified annulus. Mildly thickened leaflets .   There was mild regurgitation. - Left atrium: Moderately dilated. - Right ventricle: The cavity size was normal. Systolic function   was low normal. - Inferior vena cava: The vessel was normal in size. The   respirophasic diameter changes were in the normal range (>= 50%),   consistent with normal central venous pressure.  Impressions:  - LVEF 50-55%, mild LVH, inferoseptal hypokinesis, moderate aortic   stenosis with moderate AI and mean gradient of 20 mmHg, mild MR,   moderate LAE, noraml IvC.   EKG:  EKG is not ordered today.  This demonstrates   Recent Labs: 06/06/2018: ALT 19 06/07/2018: BUN 10; Creatinine, Ser 1.30; Hemoglobin 13.9; Magnesium 2.0; Platelets 219; Potassium 4.1; Sodium 139   Lipid Panel    Component Value Date/Time   CHOL 137 08/18/2015 1056   CHOL 146 03/31/2015 0905   TRIG 128 08/18/2015 1056   HDL 42 08/18/2015 1056   HDL 42 03/31/2015 0905  CHOLHDL 3.3 08/18/2015 1056   VLDL 26 08/18/2015 1056   LDLCALC 69 08/18/2015 1056   LDLCALC 77 03/31/2015 0905     Wt Readings from Last 3 Encounters:  12/13/18 139 lb 12.8 oz (63.4 kg)  08/21/18 144 lb 9.6 oz (65.6 kg)  07/26/18 145 lb 3.2 oz (65.9 kg)     Other studies Reviewed: Additional studies/ records that were reviewed today include:  Review of the above records demonstrates:    Assessment and Plan:   1. CAD s/p CABG with stable angina: He had CABG in 1993 and has had multiple stenting procedures since  then. Last cath in June 2019 with stable CAD. The LIMA to his LAD is patent, stent from left main to Circumflex is patent, RCA occluded with filling by collaterals. Right heart pressures were ok. He continues to have stable angina.He is still smoking. Dyspnea is worsening (see below). Will continue Plavix, statin, beta blocker, Imdur and Norvasc. He is off of ASA due to bruising and easy bleeding.   2. Hyperlipidemia: Lipids followed in primary care. Will continue statin  3. Aortic valve disease: Moderate AS and mild to moderate AI by echo June 2019. Repeat echo in 6 months.   4. HTN: BP is well controlled.   5. Tobacco abuse: He does not wish to stop smoking. Smoking cessation is recommended.   6. Ischemic cardiomyopathy/Chronic diastolic CHF: KCLE=75-17% by echo June 2019. No evidence of volume overload.   7. Dyspnea: I think his dyspnea is likely due to his COPD. His lungs are overall clear with no wheezes today. O2 sats 97% on room air. He has no evidence of volume overload. No change in baseline angina. He does have severe, multi-vessel CAD. I have asked him to follow up with Dr. Lake Bells in the pulmonary office.   Current medicines are reviewed at length with the patient today.  The patient does not have concerns regarding medicines.  The following changes have been made:    Labs/ tests ordered today include:   No orders of the defined types were placed in this encounter.   Disposition:   FU with me 6 months  Signed, Lauree Chandler, MD 12/13/2018 11:55 AM    [facility] [address], [place], [place]  [address] Phone: [phone]; Fax: [phone]

## 2018-12-13 NOTE — Patient Instructions (Signed)
Medication Instructions:  none If you need a refill on your cardiac medications before your next appointment, please call your pharmacy.   Lab work: none If you have labs (blood work) drawn today and your tests are completely normal, you will receive your results only by: . MyChart Message (if you have MyChart) OR . A paper copy in the mail If you have any lab test that is abnormal or we need to change your treatment, we will call you to review the results.  Testing/Procedures: none  Follow-Up: At [facility], you and your health needs are our priority.  As part of our continuing mission to provide you with exceptional heart care, we have created designated Provider Care Teams.  These Care Teams include your primary Cardiologist (physician) and Advanced Practice Providers (APPs -  Physician Assistants and Nurse Practitioners) who all work together to provide you with the care you need, when you need it. You will need a follow up appointment in 6 months.  Please call our office 2 months in advance to schedule this appointment.  You may see Christopher McAlhany, MD or one of the following Advanced Practice Providers on your designated Care Team:   Brittainy Simmons, PA-C Dayna Dunn, PA-C . Michele Lenze, PA-C  Any Other Special Instructions Will Be Listed Below (If Applicable).    

## 2018-12-15 ENCOUNTER — Telehealth: Payer: Self-pay

## 2018-12-15 NOTE — Telephone Encounter (Signed)
Pt is scheduled for TP on 12/19/2018 and BQ 02/17/2019 Nothing further needed

## 2018-12-15 NOTE — Telephone Encounter (Signed)
-----   Message from Juanito Doom, MD sent at 12/14/2018  1:59 PM EST ----- Sure happy to see him  Hi Guys, Can we get Mr. Steve Durham in to see me or an NP in the next few weeks? Ruby Cola ----- Message ----- From: Burnell Blanks, MD Sent: 12/13/2018  11:58 AM EST To: Juanito Doom, MD  Ruby Cola, Mr. Boteler came to see me today. He has bad CAD and COPD. He is much more dyspneic today but is not fluid overloaded. I don't think his anginal symptoms have changed. His LV function has been normal. He has aortic stenosis but it is only moderate. I didn't know if you could maybe help him from a pulmonary standpoint. I asked him to call your office. He is a really nice guy but continues to smoke. (1 billion pack years). Not sure there is anything I can change today to help with his dyspnea.   Take care,  Gerald Stabs

## 2018-12-18 ENCOUNTER — Ambulatory Visit (INDEPENDENT_AMBULATORY_CARE_PROVIDER_SITE_OTHER): Payer: Medicare Other | Admitting: Adult Health

## 2018-12-18 ENCOUNTER — Encounter: Payer: Self-pay | Admitting: Adult Health

## 2018-12-18 ENCOUNTER — Ambulatory Visit (INDEPENDENT_AMBULATORY_CARE_PROVIDER_SITE_OTHER)
Admission: RE | Admit: 2018-12-18 | Discharge: 2018-12-18 | Disposition: A | Discharge status: Home / Self Care | Payer: Medicare Other | Source: Ambulatory Visit | Attending: Adult Health | Admitting: Adult Health

## 2018-12-18 VITALS — BP 134/72 | HR 61 | Temp 97.4°F | Ht 65.0 in | Wt 140.2 lb

## 2018-12-18 DIAGNOSIS — I255 Ischemic cardiomyopathy: Secondary | ICD-10-CM | POA: Diagnosis not present

## 2018-12-18 DIAGNOSIS — J449 Chronic obstructive pulmonary disease, unspecified: Secondary | ICD-10-CM | POA: Diagnosis not present

## 2018-12-18 DIAGNOSIS — F17210 Nicotine dependence, cigarettes, uncomplicated: Secondary | ICD-10-CM | POA: Diagnosis not present

## 2018-12-18 MED ORDER — PREDNISONE 10 MG PO TABS: ORAL_TABLET | ORAL | 0 refills | Status: DC

## 2018-12-18 MED ORDER — LEVALBUTEROL HCL 0.63 MG/3ML IN NEBU
0.6300 mg | INHALATION_SOLUTION | Freq: Once | RESPIRATORY_TRACT | Status: AC
  Administered 2018-12-18: 0.63 mg via RESPIRATORY_TRACT

## 2018-12-18 MED ORDER — BUDESONIDE 0.25 MG/2ML IN SUSP: 0.2500 mg | Freq: Two times a day (BID) | RESPIRATORY_TRACT | 11 refills | Status: DC

## 2018-12-18 NOTE — Patient Instructions (Addendum)
Prednisone 20mg  daily for 5 days -take with food.  Mucinex Twice daily  As needed  Cough/congestion  Delsym 2 tsp Twice daily  As needed  Cough  Work on cutting back to quitting on smoking .  Begin Pulmicort Neb Twice daily   Continue on [employer] Twice daily.  Saline nasal rinses As needed   Gas x before meals.  Follow up with Dr. Lake Bells or Krisanne Lich NP in 6 weeks and As needed   Please contact office for sooner follow up if symptoms do not improve or worsen or seek emergency care

## 2018-12-18 NOTE — Progress Notes (Signed)
@Patient  ID: Steve Durham, male    DOB: Jan 28, 1946, 73 y.o.   MRN: [id number]  Chief Complaint  Patient presents with  . Acute Visit    cough     Referring provider: Townsend Roger, MD  HPI: 73 year old male active smoker with severe COPD     TEST/EVENTS :  2018 CT chest images independently reviewed showing mild centrilobular emphysema bilaterally, aortic calcification, there is nonspecific nodular changes in the periphery of the bases of his lungs with some nonspecific reticulation.  No honeycombing, no intralobular septal thickening, no pleural effusion.  Airways are patent  PFT: 2016 ratio 66%, FEV1 1.85 L 67% predicted, total lung capacity 5.42 L 87% predicted, DLCO 16.97 mL 62% predicted July 26, 2018 spirometry ratio 69%, FEV1 1.5 L 60% predicted  12/18/2018 Acute OV : Cough  Patient presents for an acute office visit.  Patient complains over the last 4 weeks he has had increased cough congestion wheezing.  Cough is minimally productive.  Says he was seen by his primary care provider 2 weeks ago and diagnosed with a COPD exacerbation.  He was treated with an antibiotic and a steroid taper.  Patient says symptoms did improve however he continues to have intermittent cough and wheezing.  He has been using some over-the-counter cold products without much relief.  Patient is still smoking.  Smoking cessation was discussed. Patient remains on Perforomist nebulizer twice daily.  Pulmicort was added last visit but patient says that his insurance would not cover the inhaler. Patient complains of nasal congestion postnasal drainage.  Also complains of increased gas. Denies any abdominal pain nausea vomiting or diarrhea.  Appetite is good. Chest x-ray today shows no acute process..   No Known Allergies  Immunization History  Administered Date(s) Administered  . Influenza, High Dose Seasonal PF 07/26/2018  . Influenza,inj,Quad PF,6+ Mos 06/18/2014    Past Medical History:    Diagnosis Date  . Abnormal CT scan, kidney    07/2012 - multiple small cysts  . Aortic stenosis    a. Mod AS/AI by cath 07/2012.;  b.  Echo (07/31/13) is: EF 55-60%, mild aortic stenosis (mean gradient 13);  c. Echo (5/15):  EF 40-45%, mild AS (mean 12 mmHg), mod AI  . Back pain   . Carotid artery occlusion    a. Dopplers 07/2012: no significant high grade obstruction.  . Cholelithiasis    Seen on CT 07/2012  . Coronary artery disease    a. CABG 10/1991. b. cath 07/2012 with prog dsz, turned down for re-do CABG - for med rx for now.; c. NSTEMI/PCI: DES to mLAD ext into IMA graft;  d. Inf STEMI (5/15):  LM 60-70% then 99% before LAD, pLAD occl, pCFX stent 80+% ISR, oRCA 99% then occl (L-R collats to dRCA), L-LAD ok with patent stent, S-CFX occl (old), S-RCA occl (old); PCI: LM ext into CFX with Xience Alpine DES  . Heart failure (HCC) systolic  . Hyperlipidemia   . Hyperlipoproteinemia   . Hypertension   . Ischemic cardiomyopathy    a. 2D ECHO: 08/02/2014; EF 45%; severe hypokinesis base/mid-inferolat segments and severe hypokinesis base inferior segment. Mild LVH, mild AS (may be underestimated due to LV dysfunction).  Mean gradient (S): 14 mm Hg. Peak gradient (S): 25 mm Hg. VTI ratio of LVOT to aortic valve: 0.37. Mod LA dilation. Mild RV systolic dysfxn     Tobacco History: Social History   Tobacco Use  Smoking Status Current Every Day Smoker  .  Packs/day: 1.50  . Years: 57.00  . Pack years: 85.50  . Types: Cigarettes  Smokeless Tobacco Never Used   Ready to quit: No Counseling given: Yes   Outpatient Medications Prior to Visit  Medication Sig Dispense Refill  . allopurinol (ZYLOPRIM) 100 MG tablet Take 100 mg by mouth daily.  4  . amLODipine (NORVASC) 5 MG tablet TAKE 1 TABLET BY MOUTH DAILY (Patient taking differently: Take 5 mg by mouth daily. ) 90 tablet 3  . atorvastatin (LIPITOR) 80 MG tablet TAKE 1 TABLET BY MOUTH ONCE DAILY. 90 tablet 3  . bisoprolol (ZEBETA) 5 MG  tablet TAKE 1/2 TABLET BY MOUTH TWICE DAILY (Patient taking differently: Take 2.5 mg by mouth 2 (two) times daily. ) 90 tablet 3  . clopidogrel (PLAVIX) 75 MG tablet TAKE 1 TABLET BY MOUTH DAILY (Patient taking differently: Take 75 mg by mouth daily. ) 90 tablet 3  . fexofenadine (ALLEGRA) 180 MG tablet Take 180 mg by mouth daily as needed for allergies.     . formoterol (PERFOROMIST) 20 MCG/2ML nebulizer solution Take 2 mLs (20 mcg total) by nebulization 2 (two) times daily. 120 mL 5  . furosemide (LASIX) 40 MG tablet TAKE ONE TABLET BY MOUTH TWICE DAILY (Patient taking differently: Take 40 mg by mouth 2 (two) times daily. ) 180 tablet 3  . isosorbide mononitrate (IMDUR) 60 MG 24 hr tablet TAKE ONE TABLET BY MOUTH TWICE DAILY 180 tablet 3  . nitroGLYCERIN (NITROSTAT) 0.4 MG SL tablet TAKE 1 TABLET UNDER THE TONGUE AS NEEDEDFOR CHEST PAIN ( MAY REPEAT EVERY 5 MINUTES X 3) 25 tablet 3  . pantoprazole (PROTONIX) 40 MG tablet TAKE ONE TABLET BY MOUTH ONCE DAILY 30 tablet 9  . potassium chloride SA (K-DUR,KLOR-CON) 20 MEQ tablet TAKE ONE TABLET BY MOUTH ONCE DAILY (Patient taking differently: Take 20 mEq by mouth daily. ) 90 tablet 3  . spironolactone (ALDACTONE) 25 MG tablet TAKE 1/2 TABLET BY MOUTH TWICE DAILY. 90 tablet 1   No facility-administered medications prior to visit.      Review of Systems:   Constitutional:   No  weight loss, night sweats,  Fevers, chills,  +fatigue, or  lassitude.  HEENT:   No headaches,  Difficulty swallowing,  Tooth/dental problems, or  Sore throat,                No sneezing, itching, ear ache,  +nasal congestion, post nasal drip,   CV:  No chest pain,  Orthopnea, PND, swelling in lower extremities, anasarca, dizziness, palpitations, syncope.   GI  No heartburn, indigestion, abdominal pain, nausea, vomiting, diarrhea, change in bowel habits, loss of appetite, bloody stools.   Resp:   No chest wall deformity  Skin: no rash or lesions.  GU: no dysuria,  change in color of urine, no urgency or frequency.  No flank pain, no hematuria   MS:  No joint pain or swelling.  No decreased range of motion.  No back pain.    Physical Exam  BP 134/72 (BP Location: Left Arm, Cuff Size: Normal)   Pulse 61   Temp (!) 97.4 F (36.3 C) (Oral)   Ht 5\' 5"  (1.651 m)   Wt 140 lb 3.2 oz (63.6 kg)   SpO2 98%   BMI 23.33 kg/m   GEN: A/Ox3; pleasant , NAD, elderly    HEENT:  Steve [place]/AT,  EACs-clear, TMs-wnl, NOSE-clear, THROAT-clear, no lesions, no postnasal drip or exudate noted.   NECK:  Supple w/ fair ROM; no  JVD; normal carotid impulses w/o bruits; no thyromegaly or nodules palpated; no lymphadenopathy.    RESP  Few trace rhonchi  no accessory muscle use, no dullness to percussion  CARD:  RRR, no m/r/g, no peripheral edema, pulses intact, no cyanosis or clubbing.  GI:   Soft & nt; nml bowel sounds; no organomegaly or masses detected.   Musco: Warm bil, no deformities or joint swelling noted.   Neuro: alert, no focal deficits noted.    Skin: Warm, no lesions or rashes    Lab Results:  CBC  BMET  Imaging: Dg Chest 2 View  Result Date: 12/18/2018 CLINICAL DATA:  COPD EXAM: CHEST - 2 VIEW COMPARISON:  03/17/2018 FINDINGS: Normal heart size and mediastinal contours. CABG and coronary stenting. Hyperinflation. No acute infiltrate or edema. No effusion or pneumothorax. No acute osseous findings. IMPRESSION: No acute finding. Electronically Signed   By: Monte Fantasia M.D.   On: 12/18/2018 11:30    levalbuterol (XOPENEX) nebulizer solution 0.63 mg    Date Action Dose Route User   12/18/2018 1155 Given 0.63 mg Nebulization Rinaldo Ratel, CMA      PFT Results Latest Ref Rng & Units 06/27/2015  FVC-Pre L 2.66  FVC-Predicted Pre % 71  FVC-Post L 2.75  FVC-Predicted Post % 73  Pre FEV1/FVC % % 66  Post FEV1/FCV % % 67  FEV1-Pre L 1.76  FEV1-Predicted Pre % 64  FEV1-Post L 1.85  DLCO UNC% % 62  DLCO COR %Predicted % 82  TLC L 5.42  TLC %  Predicted % 87  RV % Predicted % 117    No results found for: NITRICOXIDE      Assessment & Plan:   COPD GOLD II/ still smoking  Slow to resolve COPD exacerbation , active smoker  Xopenex nebulizer treatment given today in the office.  Add budesonide nebulizer to his current regimen Short prednisone course  Plan  Patient Instructions  Prednisone 20mg  daily for 5 days -take with food.  Mucinex Twice daily  As needed  Cough/congestion  Delsym 2 tsp Twice daily  As needed  Cough  Work on cutting back to quitting on smoking .  Begin Pulmicort Neb Twice daily   Continue on [employer] Twice daily.  Saline nasal rinses As needed   Gas x before meals.  Follow up with Dr. Lake Bells or Baraka Klatt NP in 6 weeks and As needed   Please contact office for sooner follow up if symptoms do not improve or worsen or seek emergency care         Cigarette smoker Smoking cessation      Rexene Edison, NP 12/18/2018

## 2018-12-18 NOTE — Assessment & Plan Note (Addendum)
Slow to resolve COPD exacerbation , active smoker  Xopenex nebulizer treatment given today in the office.  Add budesonide nebulizer to his current regimen Short prednisone course  Plan  Patient Instructions  Prednisone 20mg  daily for 5 days -take with food.  Mucinex Twice daily  As needed  Cough/congestion  Delsym 2 tsp Twice daily  As needed  Cough  Work on cutting back to quitting on smoking .  Begin Pulmicort Neb Twice daily   Continue on [employer] Twice daily.  Saline nasal rinses As needed   Gas x before meals.  Follow up with Dr. Lake Bells or Yedidya Duddy NP in 6 weeks and As needed   Please contact office for sooner follow up if symptoms do not improve or worsen or seek emergency care

## 2018-12-18 NOTE — Assessment & Plan Note (Signed)
Smoking cessation  

## 2018-12-18 NOTE — Progress Notes (Signed)
Reviewed, agree 

## 2018-12-26 DIAGNOSIS — L570 Actinic keratosis: Secondary | ICD-10-CM | POA: Diagnosis not present

## 2018-12-26 DIAGNOSIS — C44629 Squamous cell carcinoma of skin of left upper limb, including shoulder: Secondary | ICD-10-CM | POA: Diagnosis not present

## 2018-12-26 DIAGNOSIS — L821 Other seborrheic keratosis: Secondary | ICD-10-CM | POA: Diagnosis not present

## 2018-12-26 DIAGNOSIS — L578 Other skin changes due to chronic exposure to nonionizing radiation: Secondary | ICD-10-CM | POA: Diagnosis not present

## 2019-01-22 ENCOUNTER — Other Ambulatory Visit: Payer: Self-pay | Admitting: Cardiovascular Disease

## 2019-01-31 DIAGNOSIS — C4442 Squamous cell carcinoma of skin of scalp and neck: Secondary | ICD-10-CM | POA: Diagnosis not present

## 2019-02-21 ENCOUNTER — Ambulatory Visit: Payer: Medicare Other | Admitting: Pulmonary Disease

## 2019-03-05 ENCOUNTER — Encounter: Payer: Self-pay | Admitting: Adult Health

## 2019-03-05 ENCOUNTER — Ambulatory Visit (INDEPENDENT_AMBULATORY_CARE_PROVIDER_SITE_OTHER): Payer: Medicare Other | Admitting: Adult Health

## 2019-03-05 ENCOUNTER — Other Ambulatory Visit: Payer: Self-pay

## 2019-03-05 DIAGNOSIS — I255 Ischemic cardiomyopathy: Secondary | ICD-10-CM | POA: Diagnosis not present

## 2019-03-05 DIAGNOSIS — J449 Chronic obstructive pulmonary disease, unspecified: Secondary | ICD-10-CM | POA: Diagnosis not present

## 2019-03-05 DIAGNOSIS — F17210 Nicotine dependence, cigarettes, uncomplicated: Secondary | ICD-10-CM | POA: Diagnosis not present

## 2019-03-05 NOTE — Progress Notes (Signed)
@Patient  ID: Steve Durham, male    DOB: June 07, 1946, 73 y.o.   MRN: [id number]  Chief Complaint  Patient presents with  . Follow-up    COPD     Referring provider: Townsend Roger, MD  HPI: 73 year old male active smoker followed for severe COPD  TEST/EVENTS :  2018 CT chest images independently reviewed showing mild centrilobular emphysema bilaterally, aortic calcification, there is nonspecific nodular changes in the periphery of the bases of his lungs with some nonspecific reticulation. No honeycombing, no intralobular septal thickening, no pleural effusion. Airways are patent  PFT: 2016 ratio 66%, FEV1 1.85 L 67% predicted, total lung capacity 5.42 L 87% predicted, DLCO 16.97 mL 62% predicted July 26, 2018 spirometry ratio 69%, FEV1 1.5 L 60% predicted  03/05/2019 Follow up : COPD  Patient returns for a 37-month follow-up.  Patient has underlying severe COPD.  Says overall he is doing okay.  He had a COPD flare 2 months ago was treated with steroidsand says that he did improve with decreased cough and congestion.  Pulmicort was added to his nebulizers.  He remains on Perforomist twice daily.  Says he feels like this did help as well.  Patient also had been having some increased abdominal bloating.  Gas-X was added to his daily regimen.  Patient says this is helped quite a bit.  He feels that his breathing is better especially at nighttime.  Patient continues to smoke.  We discussed smoking cessation.  He says that he has not quit and is actually increased to smoking.  We discussed tips on trying to cut back. Patient says he is active but wears out easily.  He says he will rest and then get up and start doing more activities.  He denies any chest pain orthopnea PND or increased leg swelling. Chest xray 12/2018 showed COPD changes.   No Known Allergies    Immunization History  Administered Date(s) Administered  . Influenza, High Dose Seasonal PF 07/26/2018  . Influenza,inj,Quad  PF,6+ Mos 06/18/2014    Past Medical History:  Diagnosis Date  . Abnormal CT scan, kidney    07/2012 - multiple small cysts  . Aortic stenosis    a. Mod AS/AI by cath 07/2012.;  b.  Echo (07/31/13) is: EF 55-60%, mild aortic stenosis (mean gradient 13);  c. Echo (5/15):  EF 40-45%, mild AS (mean 12 mmHg), mod AI  . Back pain   . Carotid artery occlusion    a. Dopplers 07/2012: no significant high grade obstruction.  . Cholelithiasis    Seen on CT 07/2012  . Coronary artery disease    a. CABG 10/1991. b. cath 07/2012 with prog dsz, turned down for re-do CABG - for med rx for now.; c. NSTEMI/PCI: DES to mLAD ext into IMA graft;  d. Inf STEMI (5/15):  LM 60-70% then 99% before LAD, pLAD occl, pCFX stent 80+% ISR, oRCA 99% then occl (L-R collats to dRCA), L-LAD ok with patent stent, S-CFX occl (old), S-RCA occl (old); PCI: LM ext into CFX with Xience Alpine DES  . Heart failure (HCC) systolic  . Hyperlipidemia   . Hyperlipoproteinemia   . Hypertension   . Ischemic cardiomyopathy    a. 2D ECHO: 08/02/2014; EF 45%; severe hypokinesis base/mid-inferolat segments and severe hypokinesis base inferior segment. Mild LVH, mild AS (may be underestimated due to LV dysfunction).  Mean gradient (S): 14 mm Hg. Peak gradient (S): 25 mm Hg. VTI ratio of LVOT to aortic valve: 0.37. Mod LA  dilation. Mild RV systolic dysfxn     Tobacco History: Social History   Tobacco Use  Smoking Status Current Every Day Smoker  . Packs/day: 1.50  . Years: 57.00  . Pack years: 85.50  . Types: Cigarettes  Smokeless Tobacco Never Used   Ready to quit: No Counseling given: Yes   Outpatient Medications Prior to Visit  Medication Sig Dispense Refill  . allopurinol (ZYLOPRIM) 100 MG tablet Take 100 mg by mouth daily.  4  . amLODipine (NORVASC) 5 MG tablet TAKE 1 TABLET BY MOUTH DAILY (Patient taking differently: Take 5 mg by mouth daily. ) 90 tablet 3  . atorvastatin (LIPITOR) 80 MG tablet TAKE 1 TABLET BY MOUTH ONCE  DAILY. 90 tablet 3  . bisoprolol (ZEBETA) 5 MG tablet TAKE 1/2 TABLET BY MOUTH TWICE DAILY (Patient taking differently: Take 2.5 mg by mouth 2 (two) times daily. ) 90 tablet 3  . budesonide (PULMICORT) 0.25 MG/2ML nebulizer solution Take 2 mLs (0.25 mg total) by nebulization 2 (two) times daily. Dx: J44.9 120 mL 11  . clopidogrel (PLAVIX) 75 MG tablet TAKE 1 TABLET BY MOUTH DAILY (Patient taking differently: Take 75 mg by mouth daily. ) 90 tablet 3  . fexofenadine (ALLEGRA) 180 MG tablet Take 180 mg by mouth daily as needed for allergies.     . formoterol (PERFOROMIST) 20 MCG/2ML nebulizer solution Take 2 mLs (20 mcg total) by nebulization 2 (two) times daily. 120 mL 5  . furosemide (LASIX) 40 MG tablet TAKE ONE TABLET BY MOUTH TWICE DAILY (Patient taking differently: Take 40 mg by mouth 2 (two) times daily. ) 180 tablet 3  . isosorbide mononitrate (IMDUR) 60 MG 24 hr tablet TAKE ONE TABLET BY MOUTH TWICE DAILY 180 tablet 3  . nitroGLYCERIN (NITROSTAT) 0.4 MG SL tablet TAKE 1 TABLET UNDER THE TONGUE AS NEEDEDFOR CHEST PAIN ( MAY REPEAT EVERY 5 MINUTES X 3) 25 tablet 3  . pantoprazole (PROTONIX) 40 MG tablet TAKE ONE TABLET BY MOUTH ONCE DAILY 30 tablet 9  . potassium chloride SA (K-DUR,KLOR-CON) 20 MEQ tablet TAKE ONE TABLET BY MOUTH ONCE DAILY (Patient taking differently: Take 20 mEq by mouth daily. ) 90 tablet 3  . spironolactone (ALDACTONE) 25 MG tablet TAKE 1/2 TABLET BY MOUTH TWICE DAILY. 90 tablet 1  . predniSONE (DELTASONE) 10 MG tablet 2 tabs daily for 5 days and stop 10 tablet 0   No facility-administered medications prior to visit.      Review of Systems:   Constitutional:   No  weight loss, night sweats,  Fevers, chills, + fatigue, or  lassitude.  HEENT:   No headaches,  Difficulty swallowing,  Tooth/dental problems, or  Sore throat,                No sneezing, itching, ear ache, nasal congestion, post nasal drip,   CV:  No chest pain,  Orthopnea, PND, swelling in lower  extremities, anasarca, dizziness, palpitations, syncope.   GI  No heartburn, indigestion, abdominal pain, nausea, vomiting, diarrhea, change in bowel habits, loss of appetite, bloody stools.   Resp:  No excess mucus, no productive cough,  No non-productive cough,  No coughing up of blood.  No change in color of mucus.  No wheezing.  No chest wall deformity  Skin: no rash or lesions.  GU: no dysuria, change in color of urine, no urgency or frequency.  No flank pain, no hematuria   MS:  No joint pain or swelling.  No decreased range of  motion.  No back pain.    Physical Exam  BP 118/64 (BP Location: Left Arm, Patient Position: Sitting, Cuff Size: Normal)   Pulse 70   Temp 98.4 F (36.9 C) (Oral)   Ht 5\' 5"  (1.651 m)   Wt 144 lb 12.8 oz (65.7 kg)   SpO2 99%   BMI 24.10 kg/m   GEN: A/Ox3; pleasant , NAD, thin elderly male   HEENT:  [place]/AT,  EACs-clear, TMs-wnl, NOSE-clear, THROAT-clear, no lesions, no postnasal drip or exudate noted.   NECK:  Supple w/ fair ROM; no JVD; normal carotid impulses w/o bruits; no thyromegaly or nodules palpated; no lymphadenopathy.    RESP diminished breath sounds in the bases otherwise clear  no accessory muscle use, no dullness to percussion  CARD:  RRR, no m/r/g, no peripheral edema, pulses intact, no cyanosis or clubbing.  GI:   Soft & nt; nml bowel sounds; no organomegaly or masses detected.   Musco: Warm bil, no deformities or joint swelling noted.   Neuro: alert, no focal deficits noted.    Skin: Warm, no lesions or rashes    Lab Results:  CBC  BMET  BNP  Imaging: No results found.    PFT Results Latest Ref Rng & Units 06/27/2015  FVC-Pre L 2.66  FVC-Predicted Pre % 71  FVC-Post L 2.75  FVC-Predicted Post % 73  Pre FEV1/FVC % % 66  Post FEV1/FCV % % 67  FEV1-Pre L 1.76  FEV1-Predicted Pre % 64  FEV1-Post L 1.85  DLCO UNC% % 62  DLCO COR %Predicted % 82  TLC L 5.42  TLC % Predicted % 87  RV % Predicted % 117    No  results found for: NITRICOXIDE      Assessment & Plan:   COPD GOLD II/ still smoking  Currently stable on present regimen  Plan  Patient Instructions  Work on cutting back to quitting on smoking .  Continue on Pulmicort and Perforomist Neb Twice daily.  Ventolin Inhaler 2 puffs every 4hrs as needed for wheezing/shortness of breath .  Mucinex DM Twice daily  As needed  Cough/congestion .  Activity as tolerated.  Follow up with Dr. Lake Bells or Jenae Tomasello NP in 3 months and As needed   Please contact office for sooner follow up if symptoms do not improve or worsen or seek emergency care         Cigarette smoker Smoking cessation      Rexene Edison, NP 03/05/2019

## 2019-03-05 NOTE — Assessment & Plan Note (Signed)
Smoking cessation  

## 2019-03-05 NOTE — Assessment & Plan Note (Signed)
Currently stable on present regimen  Plan  Patient Instructions  Work on cutting back to quitting on smoking .  Continue on Pulmicort and Perforomist Neb Twice daily.  Ventolin Inhaler 2 puffs every 4hrs as needed for wheezing/shortness of breath .  Mucinex DM Twice daily  As needed  Cough/congestion .  Activity as tolerated.  Follow up with Dr. Lake Bells or [place] NP in 3 months and As needed   Please contact office for sooner follow up if symptoms do not improve or worsen or seek emergency care

## 2019-03-05 NOTE — Patient Instructions (Addendum)
Work on cutting back to quitting on smoking .  Continue on Pulmicort and Perforomist Neb Twice daily.  Ventolin Inhaler 2 puffs every 4hrs as needed for wheezing/shortness of breath .  Mucinex DM Twice daily  As needed  Cough/congestion .  Activity as tolerated.  Follow up with Dr. Lake Bells or Berea Majkowski NP in 3 months and As needed   Please contact office for sooner follow up if symptoms do not improve or worsen or seek emergency care

## 2019-04-18 ENCOUNTER — Telehealth: Payer: Self-pay | Admitting: *Deleted

## 2019-04-18 NOTE — Telephone Encounter (Signed)
Pt is due for follow up in September. I called to offer him appointment with Dr. Angelena Form on September 3,2020 at 9:20 but call was disconnected while I was speaking with pt.  I tried again to reach him but there was no answer.  Will try again tomorrow.

## 2019-04-19 NOTE — Telephone Encounter (Signed)
I spoke with pt and scheduled 6 month follow up for Sept 3,2020 at 9:20.  Pt will be fasting

## 2019-04-27 ENCOUNTER — Other Ambulatory Visit: Payer: Self-pay | Admitting: Cardiovascular Disease

## 2019-04-30 ENCOUNTER — Other Ambulatory Visit: Payer: Self-pay | Admitting: Cardiovascular Disease

## 2019-04-30 DIAGNOSIS — L570 Actinic keratosis: Secondary | ICD-10-CM | POA: Diagnosis not present

## 2019-04-30 DIAGNOSIS — C4442 Squamous cell carcinoma of skin of scalp and neck: Secondary | ICD-10-CM | POA: Diagnosis not present

## 2019-05-07 ENCOUNTER — Other Ambulatory Visit: Payer: Self-pay | Admitting: Cardiovascular Disease

## 2019-05-18 ENCOUNTER — Other Ambulatory Visit: Payer: Self-pay | Admitting: Cardiovascular Disease

## 2019-06-06 ENCOUNTER — Ambulatory Visit: Payer: Medicare Other | Admitting: Adult Health

## 2019-06-18 ENCOUNTER — Other Ambulatory Visit: Payer: Self-pay | Admitting: Cardiovascular Disease

## 2019-06-20 NOTE — Progress Notes (Signed)
Chief Complaint  Patient presents with   Follow-up    CAD   History of Present Illness: 73 yo male with history of CAD s/p CABG in 1993 with subsequent PCI/stenting, HTN, HLD, aortic stenosis, COPD and ongoing tobacco abuse here today for follow up. He has severe CAD. He was admitted October 2013 and cath performed with occlusion of LAD, patent IMA to LAD, occluded proximal RCA with occluded SVG to PDA, severe disease in left main into Circumflex with occluded vein graft to Circumflex. Re-do CABG suggested but his aorta was severely calcified. He was seen by Dr. Servando Snare with CT surgery and not felt to be a candidate for re-do bypass. Imdur was added. No intervention performed. He was admitted October 2014 with a NSTEMI. Cardiac cath with 70% left main stenosis, LAD occluded, mid LAD 99 after anastomosis of the graft, prox Circumflex 50% , OM stent with 70% ISR, pRCA occluded, SVG-PDA occluded, SVG-OM occluded, LIMA-LAD patent. A drug eluting stent was placed in the mid LAD extending back into the IMA graft. He was readmitted May 2015 with an inferolateral STEMI and found to have progression of disease in the left main/proximal Circumflex stent. A drug eluting stent was placed from the Circumflex back into the left main. He was readmitted October 2015 with unstable angina. Cardiac cath with stenosis left main stent and OM 1. A drug eluting stent was placed in the first OM branch. The left main stent restenosis was treated with balloon angioplasty. Repeat caths in April 2016 and June 2016 with stable disease. Despite all of his coronary interventions, he continues to smoke. He has been tried on Ranexa but stopped due to cost. Echo June 2019 with LVEF=50-55% with moderate aortic stenosis (mean gradient 20 mmHg) and moderate aortic insufficiency. Right and left heart cath at [facility] 04/04/18 showed severe 3V disease with patency of the stent in the IMA/LAD, stents left main, Circumflex and OM. The SVG to the  RCA and SVG to the OM known to be occluded. Right heart pressures were normal. There was a mild gradient acros the aortic valve. He was seen by Dr. Melvyn Novas in 2016 and not felt to have COPD. He was seen in October 2019 by Dr. Lake Bells in the pulmonary office and diagnosed with severe COPD.    He is here today for follow up. The patient denies any palpitations, lower extremity edema, orthopnea, PND, dizziness, near syncope or syncope. He has baseline dyspnea. He has rare chest pains that are responsive to NTG.    Primary Care Physician: Townsend Roger, MD   Past Medical History:  Diagnosis Date   Abnormal CT scan, kidney    07/2012 - multiple small cysts   Aortic stenosis    a. Mod AS/AI by cath 07/2012.;  b.  Echo (07/31/13) is: EF 55-60%, mild aortic stenosis (mean gradient 13);  c. Echo (5/15):  EF 40-45%, mild AS (mean 12 mmHg), mod AI   Back pain    Carotid artery occlusion    a. Dopplers 07/2012: no significant high grade obstruction.   Cholelithiasis    Seen on CT 07/2012   Coronary artery disease    a. CABG 10/1991. b. cath 07/2012 with prog dsz, turned down for re-do CABG - for med rx for now.; c. NSTEMI/PCI: DES to mLAD ext into IMA graft;  d. Inf STEMI (5/15):  LM 60-70% then 99% before LAD, pLAD occl, pCFX stent 80+% ISR, oRCA 99% then occl (L-R collats to dRCA), L-LAD  ok with patent stent, S-CFX occl (old), S-RCA occl (old); PCI: LM ext into CFX with Xience Alpine DES   Heart failure ([place]) systolic   Hyperlipidemia    Hyperlipoproteinemia    Hypertension    Ischemic cardiomyopathy    a. 2D ECHO: 08/02/2014; EF 45%; severe hypokinesis base/mid-inferolat segments and severe hypokinesis base inferior segment. Mild LVH, mild AS (may be underestimated due to LV dysfunction).  Mean gradient (S): 14 mm Hg. Peak gradient (S): 25 mm Hg. VTI ratio of LVOT to aortic valve: 0.37. Mod LA dilation. Mild RV systolic dysfxn     Past Surgical History:  Procedure Laterality Date    CARDIAC CATHETERIZATION  04/03/99   CARDIAC CATHETERIZATION N/A 03/20/2015   Procedure: Left Heart Cath and Cors/Grafts Angiography;  Surgeon: Jettie Booze, MD;  Location: [place] [facility];  Service: Cardiovascular;  Laterality: N/A;   CARDIAC CATHETERIZATION N/A 04/28/2016   Procedure: Right/Left Heart Cath and Coronary/Graft Angiography;  Surgeon: Burnell Blanks, MD;  Location: [place] [facility];  Service: Cardiovascular;  Laterality: N/A;   CARPAL TUNNEL RELEASE  2007   Excision mass dorsal left wrist   CORONARY ARTERY BYPASS GRAFT  1993   FASCIOTOMY Right 07/30/2013   Procedure: OPEN FASCIOTOMY RIGHT RING FINGER & RIGHT SMALL FINGER MULTIPLE LEVELS;  Surgeon: Wynonia Sours, MD;  Location: [place];  Service: Orthopedics;  Laterality: Right;   HERNIA REPAIR  1988   INGUINAL HERNIA REPAIR Left 06/07/2018   Procedure: LEFT INGUINAL HERNIA REPAIR;  Surgeon: [name], MD;  Location: [place];  Service: General;  Laterality: Left;   INSERTION OF MESH Left 06/07/2018   Procedure: INSERTION OF MESH;  Surgeon: [name], MD;  Location: [place];  Service: General;  Laterality: Left;   LEFT HEART CATHETERIZATION WITH CORONARY ANGIOGRAM N/A 02/28/2014   Procedure: LEFT HEART CATHETERIZATION WITH CORONARY ANGIOGRAM;  Surgeon: Troy Sine, MD;  Location: [facility] CATH LAB;  Service: Cardiovascular;  Laterality: N/A;   LEFT HEART CATHETERIZATION WITH CORONARY/GRAFT ANGIOGRAM N/A 08/10/2012   Procedure: LEFT HEART CATHETERIZATION WITH Beatrix Fetters;  Surgeon: Hillary Bow, MD;  Location: [facility] CATH LAB;  Service: Cardiovascular;  Laterality: N/A;   LEFT HEART CATHETERIZATION WITH CORONARY/GRAFT ANGIOGRAM N/A 08/01/2014   Procedure: LEFT HEART CATHETERIZATION WITH Beatrix Fetters;  Surgeon: Leonie Man, MD;  Location: [facility] CATH LAB;  Service: Cardiovascular;  Laterality: N/A;   LEFT HEART CATHETERIZATION WITH CORONARY/GRAFT ANGIOGRAM N/A 02/12/2015     Procedure: LEFT HEART CATHETERIZATION WITH Beatrix Fetters;  Surgeon: Burnell Blanks, MD;  Location: [facility] CATH LAB;  Service: Cardiovascular;  Laterality: N/A;   PERCUTANEOUS CORONARY STENT INTERVENTION (PCI-S)  07/31/2013   Procedure: PERCUTANEOUS CORONARY STENT INTERVENTION (PCI-S);  Surgeon: Burnell Blanks, MD;  Location: [facility] CATH LAB;  Service: Cardiovascular;;  LIMA to LAD at the anastomosis with aortic root shot    RIGHT/LEFT HEART CATH AND CORONARY/GRAFT ANGIOGRAPHY N/A 04/04/2018   Procedure: RIGHT/LEFT HEART CATH AND CORONARY/GRAFT ANGIOGRAPHY;  Surgeon: Martinique, Peter M, MD;  Location: [place] [facility];  Service: Cardiovascular;  Laterality: N/A;    Current Outpatient Medications  Medication Sig Dispense Refill   allopurinol (ZYLOPRIM) 100 MG tablet Take 100 mg by mouth daily.  4   amLODipine (NORVASC) 5 MG tablet TAKE ONE (1) TABLET BY MOUTH EVERY DAY 90 tablet 1   atorvastatin (LIPITOR) 80 MG tablet TAKE 1 TABLET BY MOUTH ONCE DAILY. 90 tablet 3   bisoprolol (ZEBETA) 5 MG tablet TAKE  1/2 TABLET BY MOUTH TWICE DAILY. 90 tablet 1   budesonide (PULMICORT) 0.25 MG/2ML nebulizer solution Take 2 mLs (0.25 mg total) by nebulization 2 (two) times daily. Dx: J44.9 120 mL 11   clopidogrel (PLAVIX) 75 MG tablet TAKE 1 TABLET BY MOUTH DAILY 90 tablet 1   fexofenadine (ALLEGRA) 180 MG tablet Take 180 mg by mouth daily as needed for allergies.      formoterol (PERFOROMIST) 20 MCG/2ML nebulizer solution Take 2 mLs (20 mcg total) by nebulization 2 (two) times daily. 120 mL 5   furosemide (LASIX) 40 MG tablet TAKE ONE TABLET BY MOUTH TWICE DAILY 180 tablet 1   isosorbide mononitrate (IMDUR) 60 MG 24 hr tablet TAKE ONE TABLET BY MOUTH TWICE DAILY 180 tablet 3   nitroGLYCERIN (NITROSTAT) 0.4 MG SL tablet TAKE 1 TABLET UNDER THE TONGUE AS NEEDEDFOR CHEST PAIN ( MAY REPEAT EVERY 5 MINUTES X 3) 25 tablet 3   pantoprazole (PROTONIX) 40 MG tablet TAKE ONE TABLET BY  MOUTH ONCE DAILY 30 tablet 9   potassium chloride SA (K-DUR) 20 MEQ tablet TAKE 1 TABLET BY MOUTH DAILY 90 tablet 1   spironolactone (ALDACTONE) 25 MG tablet TAKE 1/2 TABLET BY MOUTH TWICE DAILY 90 tablet 1   No current facility-administered medications for this visit.     No Known Allergies  Social History   Socioeconomic History   Marital status: Married    Spouse name: Not on file   Number of children: 1   Years of education: Not on file   Highest education level: Not on file  Occupational History   Occupation: Plumbing    Comment: Retired in 2007  [place] resource strain: Not on file   Food insecurity    Worry: Not on file    Inability: Not on Lexicographer needs    Medical: Not on file    Non-medical: Not on file  Tobacco Use   Smoking status: Current Every Day Smoker    Packs/day: 1.50    Years: 57.00    Pack years: 85.50    Types: Cigarettes   Smokeless tobacco: Never Used  Substance and Sexual Activity   Alcohol use: Yes    Alcohol/week: 5.0 standard drinks    Types: 5 Cans of beer per week    Comment: daily-6 daily   Drug use: No   Sexual activity: Not on file  Lifestyle   Physical activity    Days per week: Not on file    Minutes per session: Not on file   Stress: Not on file  Relationships   Social connections    Talks on phone: Not on file    Gets together: Not on file    Attends religious service: Not on file    Active member of club or organization: Not on file    Attends meetings of clubs or organizations: Not on file    Relationship status: Not on file   Intimate partner violence    Fear of current or ex partner: Not on file    Emotionally abused: Not on file    Physically abused: Not on file    Forced sexual activity: Not on file  Other Topics Concern   Not on file  Social History Narrative   Lives in [place] with his family.  Does not routinely exercise.    Family History  Problem  Relation Age of Onset   Heart attack Mother  died @ 69.   Hypertension Mother    Hypertension Father    Hypertension Sister    Emphysema Brother    Hypertension Brother    Allergies Sister    Stroke Neg Hx     Review of Systems:  As stated in the HPI and otherwise negative.   BP 130/68    Pulse (!) 56    Ht 5\' 5"  (1.651 m)    Wt 143 lb (64.9 kg)    SpO2 98%    BMI 23.80 kg/m   Physical Examination:  General: Well developed, well nourished, He appears dyspneic.  HEENT: OP clear, mucus membranes moist  SKIN: warm, dry. No rashes. Neuro: No focal deficits  Musculoskeletal: Muscle strength 5/5 all ext  Psychiatric: Mood and affect normal  Neck: No JVD, no carotid bruits, no thyromegaly, no lymphadenopathy.  Lungs:Clear bilaterally, no wheezes, rhonci, crackles Cardiovascular: Regular rate and rhythm. Loud systolic murmur.  Abdomen:Soft. Bowel sounds present. Non-tender.  Extremities: No lower extremity edema. Pulses are 2 + in the bilateral DP/PT.   Cardiac Cath June 2019:    Non-stenotic Ost LM to LM lesion previously treated.  Ost LAD to Prox LAD lesion is 100% stenosed.  Ost Cx to Prox Cx lesion is 50% stenosed.  Ost RCA to Prox RCA lesion is 100% stenosed.  Previously placed Dist LAD stent (unknown type) is widely patent.  Previously placed Ost 1st Mrg stent (unknown type) is widely patent.  LV end diastolic pressure is normal.  Echo June 2019:  - Left ventricle: The cavity size was normal. Wall thickness was   increased in a pattern of mild LVH. Systolic function was normal.   The estimated ejection fraction was in the range of 50% to 55%.   Inferoseptal hypokinesis. The study is not technically sufficient   to allow evaluation of LV diastolic function. - Aortic valve: Moderate stenosis and moderate regurgitation. Mean   gradient (S): 20 mm Hg. Peak gradient (S): 43 mm Hg. - Mitral valve: Calcified annulus. Mildly thickened leaflets .   There  was mild regurgitation. - Left atrium: Moderately dilated. - Right ventricle: The cavity size was normal. Systolic function   was low normal. - Inferior vena cava: The vessel was normal in size. The   respirophasic diameter changes were in the normal range (>= 50%),   consistent with normal central venous pressure.  Impressions:  - LVEF 50-55%, mild LVH, inferoseptal hypokinesis, moderate aortic   stenosis with moderate AI and mean gradient of 20 mmHg, mild MR,   moderate LAE, noraml IvC.   EKG:  EKG is ordered today.  This demonstrates Sinus bradycardia, IVCD. T wave abn  Recent Labs: No results found for requested labs within last 8760 hours.   Lipid Panel    Component Value Date/Time   CHOL 137 08/18/2015 1056   CHOL 146 03/31/2015 0905   TRIG 128 08/18/2015 1056   HDL 42 08/18/2015 1056   HDL 42 03/31/2015 0905   CHOLHDL 3.3 08/18/2015 1056   VLDL 26 08/18/2015 1056   LDLCALC 69 08/18/2015 1056   LDLCALC 77 03/31/2015 0905     Wt Readings from Last 3 Encounters:  06/21/19 143 lb (64.9 kg)  03/05/19 144 lb 12.8 oz (65.7 kg)  12/18/18 140 lb 3.2 oz (63.6 kg)     Other studies Reviewed: Additional studies/ records that were reviewed today include:  Review of the above records demonstrates:    Assessment and Plan:   1. CAD s/p CABG  with stable angina: He had CABG in 1993 and has had multiple stenting procedures since then. Last cath in June 2019 with stable CAD. The LIMA to his LAD is patent, stent from left main to Circumflex is patent, RCA occluded with filling by collaterals. No change in his chronic chest pain. Continue Plavix, statin, Imdur, Norvasc and beta blocker. He is off of ASA due to bruising and easy bleeding.   2. Hyperlipidemia: will check lipids and LFTS today. Continue statin.   3. Aortic stenosis: Moderate AS and mild to moderate AI by echo June 2019. Will repeat echo now.  4. HTN: BP is controlled. Check BMET today  5. Tobacco abuse: He  does not wish to stop smoking. Smoking cessation is recommended.   6. Ischemic cardiomyopathy/Chronic diastolic CHF: [id number] by echo June 2019. He is not volume overloaded.   7. Dyspnea: I think his dyspnea is likely due to a combination of his COPD, AS and CAD. No changes today  Current medicines are reviewed at length with the patient today.  The patient does not have concerns regarding medicines.  The following changes have been made:    Labs/ tests ordered today include:   Orders Placed This Encounter  Procedures   Basic metabolic panel   Hepatic function panel   Lipid panel   EKG 12-Lead   ECHOCARDIOGRAM COMPLETE    Disposition:   FU with me 6 months  Signed, Lauree Chandler, MD 06/21/2019 9:43 AM    [facility] [place], [place], [place]  [address] Phone: [phone]; Fax: [phone]

## 2019-06-21 ENCOUNTER — Other Ambulatory Visit: Payer: Self-pay

## 2019-06-21 ENCOUNTER — Ambulatory Visit (INDEPENDENT_AMBULATORY_CARE_PROVIDER_SITE_OTHER): Payer: Medicare Other | Admitting: Cardiovascular Disease

## 2019-06-21 ENCOUNTER — Encounter: Payer: Self-pay | Admitting: Cardiovascular Disease

## 2019-06-21 VITALS — BP 130/68 | HR 56 | Ht 65.0 in | Wt 143.0 lb

## 2019-06-21 DIAGNOSIS — I10 Essential (primary) hypertension: Secondary | ICD-10-CM | POA: Diagnosis not present

## 2019-06-21 DIAGNOSIS — I5032 Chronic diastolic (congestive) heart failure: Secondary | ICD-10-CM | POA: Diagnosis not present

## 2019-06-21 DIAGNOSIS — Z720 Tobacco use: Secondary | ICD-10-CM | POA: Diagnosis not present

## 2019-06-21 DIAGNOSIS — I255 Ischemic cardiomyopathy: Secondary | ICD-10-CM

## 2019-06-21 DIAGNOSIS — I350 Nonrheumatic aortic (valve) stenosis: Secondary | ICD-10-CM

## 2019-06-21 DIAGNOSIS — I2510 Atherosclerotic heart disease of native coronary artery without angina pectoris: Secondary | ICD-10-CM | POA: Diagnosis not present

## 2019-06-21 DIAGNOSIS — E7800 Pure hypercholesterolemia, unspecified: Secondary | ICD-10-CM

## 2019-06-21 LAB — LIPID PANEL
Chol/HDL Ratio: 2.8 ratio (ref 0.0–5.0)
Cholesterol, Total: 149 mg/dL (ref 100–199)
HDL: 54 mg/dL (ref >39)
LDL Chol Calc (NIH): 78 mg/dL (ref 0–99)
Triglycerides: 88 mg/dL (ref 0–149)
VLDL Cholesterol Cal: 17 mg/dL (ref 5–40)

## 2019-06-21 LAB — BASIC METABOLIC PANEL
BUN/Creatinine Ratio: 17 (ref 10–24)
BUN: 24 mg/dL (ref 8–27)
CO2: 22 mmol/L (ref 20–29)
Calcium: 9.4 mg/dL (ref 8.6–10.2)
Chloride: 102 mmol/L (ref 96–106)
Creatinine, Ser: 1.41 mg/dL — ABNORMAL HIGH (ref 0.76–1.27)
GFR calc Af Amer: 56 mL/min/{1.73_m2} — ABNORMAL LOW (ref >59)
GFR calc non Af Amer: 48 mL/min/{1.73_m2} — ABNORMAL LOW (ref >59)
Glucose: 93 mg/dL (ref 65–99)
Potassium: 4.6 mmol/L (ref 3.5–5.2)
Sodium: 139 mmol/L (ref 134–144)

## 2019-06-21 LAB — HEPATIC FUNCTION PANEL
ALT: 15 IU/L (ref 0–44)
AST: 21 IU/L (ref 0–40)
Albumin: 4.4 g/dL (ref 3.7–4.7)
Alkaline Phosphatase: 73 IU/L (ref 39–117)
Bilirubin Total: 0.8 mg/dL (ref 0.0–1.2)
Bilirubin, Direct: 0.21 mg/dL (ref 0.00–0.40)
Total Protein: 6.5 g/dL (ref 6.0–8.5)

## 2019-06-21 NOTE — Patient Instructions (Signed)
Medication Instructions:  Your physician recommends that you continue on your current medications as directed. Please refer to the Current Medication list given to you today.  If you need a refill on your cardiac medications before your next appointment, please call your pharmacy.   Lab work: BMET, Liver and Lipid today  If you have labs (blood work) drawn today and your tests are completely normal, you will receive your results only by: Marland Kitchen MyChart Message (if you have MyChart) OR . A paper copy in the mail If you have any lab test that is abnormal or we need to change your treatment, we will call you to review the results.  Testing/Procedures: Your physician has requested that you have an echocardiogram. Echocardiography is a painless test that uses sound waves to create images of your heart. It provides your doctor with information about the size and shape of your heart and how well your heart's chambers and valves are working. This procedure takes approximately one hour. There are no restrictions for this procedure.   Follow-Up: At [facility], you and your health needs are our priority.  As part of our continuing mission to provide you with exceptional heart care, we have created designated Provider Care Teams.  These Care Teams include your primary Cardiologist (physician) and Advanced Practice Providers (APPs -  Physician Assistants and Nurse Practitioners) who all work together to provide you with the care you need, when you need it. You will need a follow up appointment in 6 months.  Please call our office 2 months in advance to schedule this appointment.  You may see Lauree Chandler, MD or one of the following Advanced Practice Providers on your designated Care Team:   [place], PA-C Melina Copa, PA-C . Ermalinda Barrios, PA-C  Any Other Special Instructions Will Be Listed Below (If Applicable).

## 2019-06-26 DIAGNOSIS — M109 Gout, unspecified: Secondary | ICD-10-CM | POA: Diagnosis not present

## 2019-07-02 ENCOUNTER — Other Ambulatory Visit: Payer: Self-pay | Admitting: Cardiovascular Disease

## 2019-07-05 ENCOUNTER — Other Ambulatory Visit: Payer: Self-pay

## 2019-07-05 ENCOUNTER — Ambulatory Visit (HOSPITAL_COMMUNITY): Payer: Medicare Other | Attending: Cardiovascular Disease

## 2019-07-05 DIAGNOSIS — I350 Nonrheumatic aortic (valve) stenosis: Secondary | ICD-10-CM

## 2019-07-09 DIAGNOSIS — Z23 Encounter for immunization: Secondary | ICD-10-CM | POA: Diagnosis not present

## 2019-07-12 ENCOUNTER — Other Ambulatory Visit: Payer: Self-pay | Admitting: Cardiovascular Disease

## 2019-07-12 DIAGNOSIS — I25119 Atherosclerotic heart disease of native coronary artery with unspecified angina pectoris: Secondary | ICD-10-CM

## 2019-07-12 DIAGNOSIS — E785 Hyperlipidemia, unspecified: Secondary | ICD-10-CM

## 2019-07-26 NOTE — H&P (View-Only) (Signed)
Chief Complaint  Patient presents with   Follow-up    severe aortic stenosis   History of Present Illness: 73 yo male with history of CAD s/p CABG in 1993 with subsequent PCI/stenting, HTN, HLD, aortic stenosis, COPD, cardiomyopathy and ongoing tobacco abuse here today for follow up. He has severe CAD. He was admitted October 2013 and cath performed with occlusion of LAD, patent IMA to LAD, occluded proximal RCA with occluded SVG to PDA, severe disease in left main into Circumflex with occluded vein graft to Circumflex. Re-do CABG suggested but his aorta was severely calcified. He was seen by Dr. Servando Snare with CT surgery and not felt to be a candidate for re-do bypass. Imdur was added. No intervention performed. He was admitted October 2014 with a NSTEMI. Cardiac cath with 70% left main stenosis, LAD occluded, mid LAD 99 after anastomosis of the graft, prox Circumflex 50% , OM stent with 70% ISR, pRCA occluded, SVG-PDA occluded, SVG-OM occluded, LIMA-LAD patent. A drug eluting stent was placed in the mid LAD extending back into the IMA graft. He was readmitted May 2015 with an inferolateral STEMI and found to have progression of disease in the left main/proximal Circumflex stent. A drug eluting stent was placed from the Circumflex back into the left main. He was readmitted October 2015 with unstable angina. Cardiac cath with stenosis left main stent and OM 1. A drug eluting stent was placed in the first OM branch. The left main stent restenosis was treated with balloon angioplasty. Repeat caths in April 2016 and June 2016 with stable disease. Despite all of his coronary interventions, he continued to smoke. He has been tried on Ranexa but stopped due to cost. Echo June 2019 with LVEF=50-55% with moderate aortic stenosis (mean gradient 20 mmHg) and moderate aortic insufficiency. Right and left heart cath at [facility] 04/04/18 showed severe 3V disease with patency of the stent in the IMA/LAD, stents left main,  Circumflex and OM. The SVG to the RCA and SVG to the OM known to be occluded. Right heart pressures were normal. There was a mild gradient acros the aortic valve. He was seen by Dr. Melvyn Novas in 2016 and not felt to have COPD at that time. He was seen in October 2019 by Dr. Lake Bells in the pulmonary office and diagnosed with severe COPD.  I saw him September 2020 and he reported continued dyspnea at rest and with exertion. Echo 07/05/19 with LVEF=30-35%. Mild mitral regurgitation. The aortic valve leaflets are thickened and calcified. Mean gradient 24 mmHg, peak gradient 44.6 mmHg, AVA 1.08 cm2, dimensionless index 0.36. There is moderate to severe aortic insufficiency.   He is here today for follow up. The patient denies any palpitations, lower extremity edema, orthopnea, PND, dizziness, near syncope or syncope. He continues to have profound dyspnea. He has rare chest pains. He has progressive fatigue. He has no active dental issues. He lives with his wife in [place].    Primary Care Physician: Townsend Roger, MD  Past Medical History:  Diagnosis Date   Abnormal CT scan, kidney    07/2012 - multiple small cysts   Aortic stenosis    a. Mod AS/AI by cath 07/2012.;  b.  Echo (07/31/13) is: EF 55-60%, mild aortic stenosis (mean gradient 13);  c. Echo (5/15):  EF 40-45%, mild AS (mean 12 mmHg), mod AI   Back pain    Carotid artery occlusion    a. Dopplers 07/2012: no significant high grade obstruction.   Cholelithiasis  Seen on CT 07/2012   Coronary artery disease    a. CABG 10/1991. b. cath 07/2012 with prog dsz, turned down for re-do CABG - for med rx for now.; c. NSTEMI/PCI: DES to mLAD ext into IMA graft;  d. Inf STEMI (5/15):  LM 60-70% then 99% before LAD, pLAD occl, pCFX stent 80+% ISR, oRCA 99% then occl (L-R collats to dRCA), L-LAD ok with patent stent, S-CFX occl (old), S-RCA occl (old); PCI: LM ext into CFX with Xience Alpine DES   Heart failure ([place]) systolic   Hyperlipidemia     Hyperlipoproteinemia    Hypertension    Ischemic cardiomyopathy    a. 2D ECHO: 08/02/2014; EF 45%; severe hypokinesis base/mid-inferolat segments and severe hypokinesis base inferior segment. Mild LVH, mild AS (may be underestimated due to LV dysfunction).  Mean gradient (S): 14 mm Hg. Peak gradient (S): 25 mm Hg. VTI ratio of LVOT to aortic valve: 0.37. Mod LA dilation. Mild RV systolic dysfxn     Past Surgical History:  Procedure Laterality Date   CARDIAC CATHETERIZATION  04/03/99   CARDIAC CATHETERIZATION N/A 03/20/2015   Procedure: Left Heart Cath and Cors/Grafts Angiography;  Surgeon: Jettie Booze, MD;  Location: [facility];  Service: Cardiovascular;  Laterality: N/A;   CARDIAC CATHETERIZATION N/A 04/28/2016   Procedure: Right/Left Heart Cath and Coronary/Graft Angiography;  Surgeon: Burnell Blanks, MD;  Location: [place] [facility];  Service: Cardiovascular;  Laterality: N/A;   CARPAL TUNNEL RELEASE  2007   Excision mass dorsal left wrist   CORONARY ARTERY BYPASS GRAFT  1993   FASCIOTOMY Right 07/30/2013   Procedure: OPEN FASCIOTOMY RIGHT RING FINGER & RIGHT SMALL FINGER MULTIPLE LEVELS;  Surgeon: Wynonia Sours, MD;  Location: [facility];  Service: Orthopedics;  Laterality: Right;   HERNIA REPAIR  1988   INGUINAL HERNIA REPAIR Left 06/07/2018   Procedure: LEFT INGUINAL HERNIA REPAIR;  Surgeon: [name], MD;  Location: [facility] [place];  Service: General;  Laterality: Left;   INSERTION OF MESH Left 06/07/2018   Procedure: INSERTION OF MESH;  Surgeon: [name], MD;  Location: [place];  Service: General;  Laterality: Left;   LEFT HEART CATHETERIZATION WITH CORONARY ANGIOGRAM N/A 02/28/2014   Procedure: LEFT HEART CATHETERIZATION WITH CORONARY ANGIOGRAM;  Surgeon: Troy Sine, MD;  Location: [facility] CATH LAB;  Service: Cardiovascular;  Laterality: N/A;   LEFT HEART CATHETERIZATION WITH CORONARY/GRAFT ANGIOGRAM N/A 08/10/2012   Procedure: LEFT HEART  CATHETERIZATION WITH Beatrix Fetters;  Surgeon: Hillary Bow, MD;  Location: [facility] CATH LAB;  Service: Cardiovascular;  Laterality: N/A;   LEFT HEART CATHETERIZATION WITH CORONARY/GRAFT ANGIOGRAM N/A 08/01/2014   Procedure: LEFT HEART CATHETERIZATION WITH Beatrix Fetters;  Surgeon: Leonie Man, MD;  Location: [facility] CATH LAB;  Service: Cardiovascular;  Laterality: N/A;   LEFT HEART CATHETERIZATION WITH CORONARY/GRAFT ANGIOGRAM N/A 02/12/2015   Procedure: LEFT HEART CATHETERIZATION WITH Beatrix Fetters;  Surgeon: Burnell Blanks, MD;  Location: [facility] CATH LAB;  Service: Cardiovascular;  Laterality: N/A;   PERCUTANEOUS CORONARY STENT INTERVENTION (PCI-S)  07/31/2013   Procedure: PERCUTANEOUS CORONARY STENT INTERVENTION (PCI-S);  Surgeon: Burnell Blanks, MD;  Location: [facility] CATH LAB;  Service: Cardiovascular;;  LIMA to LAD at the anastomosis with aortic root shot    RIGHT/LEFT HEART CATH AND CORONARY/GRAFT ANGIOGRAPHY N/A 04/04/2018   Procedure: RIGHT/LEFT HEART CATH AND CORONARY/GRAFT ANGIOGRAPHY;  Surgeon: Martinique, Peter M, MD;  Location: [facility];  Service: Cardiovascular;  Laterality: N/A;  Current Outpatient Medications  Medication Sig Dispense Refill   allopurinol (ZYLOPRIM) 100 MG tablet Take 100 mg by mouth daily.  4   amLODipine (NORVASC) 5 MG tablet TAKE ONE (1) TABLET BY MOUTH EVERY DAY 90 tablet 1   atorvastatin (LIPITOR) 80 MG tablet TAKE 1 TABLET BY MOUTH ONCE DAILY. 90 tablet 3   bisoprolol (ZEBETA) 5 MG tablet TAKE 1/2 TABLET BY MOUTH TWICE DAILY. 90 tablet 1   budesonide (PULMICORT) 0.25 MG/2ML nebulizer solution Take 2 mLs (0.25 mg total) by nebulization 2 (two) times daily. Dx: J44.9 120 mL 11   clopidogrel (PLAVIX) 75 MG tablet TAKE 1 TABLET BY MOUTH DAILY 90 tablet 1   fexofenadine (ALLEGRA) 180 MG tablet Take 180 mg by mouth daily as needed for allergies.      formoterol (PERFOROMIST) 20 MCG/2ML nebulizer solution Take  2 mLs (20 mcg total) by nebulization 2 (two) times daily. 120 mL 5   furosemide (LASIX) 40 MG tablet TAKE ONE TABLET BY MOUTH TWICE DAILY 180 tablet 1   isosorbide mononitrate (IMDUR) 60 MG 24 hr tablet TAKE ONE TABLET BY MOUTH TWICE DAILY 180 tablet 3   nitroGLYCERIN (NITROSTAT) 0.4 MG SL tablet TAKE 1 TABLET UNDER THE TONGUE AS NEEDEDFOR CHEST PAIN ( MAY REPEAT EVERY 5 MINUTES X 3) 25 tablet 3   pantoprazole (PROTONIX) 40 MG tablet TAKE ONE TABLET BY MOUTH ONCE DAILY 30 tablet 9   potassium chloride SA (K-DUR) 20 MEQ tablet TAKE 1 TABLET BY MOUTH DAILY 90 tablet 1   spironolactone (ALDACTONE) 25 MG tablet TAKE 1/2 TABLET BY MOUTH TWICE DAILY 90 tablet 1   No current facility-administered medications for this visit.     No Known Allergies  Social History   Socioeconomic History   Marital status: Married    Spouse name: Not on file   Number of children: 1   Years of education: Not on file   Highest education level: Not on file  Occupational History   Occupation: Plumbing    Comment: Retired in 2007  [place] resource strain: Not on file   Food insecurity    Worry: Not on file    Inability: Not on Lexicographer needs    Medical: Not on file    Non-medical: Not on file  Tobacco Use   Smoking status: Current Every Day Smoker    Packs/day: 1.50    Years: 57.00    Pack years: 85.50    Types: Cigarettes   Smokeless tobacco: Never Used  Substance and Sexual Activity   Alcohol use: Yes    Alcohol/week: 5.0 standard drinks    Types: 5 Cans of beer per week    Comment: daily-6 daily   Drug use: No   Sexual activity: Not on file  Lifestyle   Physical activity    Days per week: Not on file    Minutes per session: Not on file   Stress: Not on file  Relationships   Social connections    Talks on phone: Not on file    Gets together: Not on file    Attends religious service: Not on file    Active member of club or organization: Not  on file    Attends meetings of clubs or organizations: Not on file    Relationship status: Not on file   Intimate partner violence    Fear of current or ex partner: Not on file    Emotionally abused: Not on file  Physically abused: Not on file    Forced sexual activity: Not on file  Other Topics Concern   Not on file  Social History Narrative   Lives in [place] with his family.  Does not routinely exercise.    Family History  Problem Relation Age of Onset   Heart attack Mother        died @ 27.   Hypertension Mother    Hypertension Father    Hypertension Sister    Emphysema Brother    Hypertension Brother    Allergies Sister    Stroke Neg Hx     Review of Systems:  As stated in the HPI and otherwise negative.   BP (!) 144/96    Pulse 64    Ht 5\' 5"  (1.651 m)    Wt 144 lb 6.4 oz (65.5 kg)    SpO2 99%    BMI 24.03 kg/m   Physical Examination:  General: Well developed, well nourished, NAD  HEENT: OP clear, mucus membranes moist  SKIN: warm, dry. No rashes. Neuro: No focal deficits  Musculoskeletal: Muscle strength 5/5 all ext  Psychiatric: Mood and affect normal  Neck: No JVD, no carotid bruits, no thyromegaly, no lymphadenopathy.  Lungs: Clear bilaterally, no wheezes, rhonci, crackles Cardiovascular: Regular rate and rhythm. Harsh systolic murmur noted.  Abdomen:Soft. Bowel sounds present. Non-tender.  Extremities: No lower extremity edema.   Cardiac Cath June 2019:    Non-stenotic Ost LM to LM lesion previously treated.  Ost LAD to Prox LAD lesion is 100% stenosed.  Ost Cx to Prox Cx lesion is 50% stenosed.  Ost RCA to Prox RCA lesion is 100% stenosed.  Previously placed Dist LAD stent (unknown type) is widely patent.  Previously placed Ost 1st Mrg stent (unknown type) is widely patent.  LV end diastolic pressure is normal.  Patent LIMA to LAD  Occluded SVG to OM  Occluded SVG to RCA  Diagnostic Dominance:  Co-dominant  Intervention    Echo 07/05/19:  1. Left ventricular ejection fraction, by visual estimation, is 30 to 35%. The left ventricle has moderate to severely decreased function. Left ventricular septal wall thickness was normal. Normal left ventricular posterior wall thickness. There is no  left ventricular hypertrophy.  2. Left ventricular diastolic Doppler parameters are consistent with pseudonormalization pattern of LV diastolic filling.  3. GLS = -13.2%.       4. Global right ventricle has normal systolic function.The right ventricular size is normal. No increase in right ventricular wall thickness.  5. Left atrial size was moderately dilated.  6. Right atrial size was normal.  7. The mitral valve is normal in structure. Mild mitral valve regurgitation.  8. The tricuspid valve is normal in structure. Tricuspid valve regurgitation was not visualized by color flow Doppler.  9. The aortic valve The aortic valve has an indeterminant number of cusps Aortic valve regurgitation is moderate to severe by color flow Doppler. Moderate aortic valve stenosis. 10. The pulmonic valve was normal in structure. Pulmonic valve regurgitation is trivial by color flow Doppler. 11. The atrial septum is grossly normal.  FINDINGS  Left Ventricle: Left ventricular ejection fraction, by visual estimation, is 30 to 35%. The left ventricle has moderate to severely decreased function. Left ventricular septal wall thickness was normal. Normal left ventricular posterior wall thickness.  There is no left ventricular hypertrophy. Spectral Doppler shows Left ventricular diastolic Doppler parameters are consistent with pseudonormalization pattern of LV diastolic filling. GLS = -13.2%.  Right Ventricle: The right  ventricular size is normal. No increase in right ventricular wall thickness. Global RV systolic function is has normal systolic function.  Left Atrium: Left atrial size was moderately dilated.  Right  Atrium: Right atrial size was normal in size  Pericardium: There is no evidence of pericardial effusion.  Mitral Valve: The mitral valve is normal in structure. Mild mitral valve regurgitation.  Tricuspid Valve: The tricuspid valve is normal in structure. Tricuspid valve regurgitation was not visualized by color flow Doppler.  Aortic Valve: The aortic valve The aortic valve has an indeterminant number of cusps. Aortic valve regurgitation is moderate to severe by color flow Doppler. Aortic regurgitation PHT measures 318 msec. Moderate aortic stenosis is present. Aortic valve  mean gradient measures 24.0 mmHg. Aortic valve peak gradient measures 44.6 mmHg. Aortic valve area, by VTI measures 1.12 cm.  Pulmonic Valve: The pulmonic valve was normal in structure. Pulmonic valve regurgitation is trivial by color flow Doppler.  Aorta: The aortic root is normal in size and structure.  Shunts: The atrial septum is grossly normal.    LEFT VENTRICLE          Normals PLAX 2D LVIDd:         5.60 cm  3.6 cm   Diastology                 Normals LVIDs:         4.80 cm  1.7 cm   LV e' lateral:   6.53 cm/s 6.42 cm/s LV PW:         0.90 cm  1.4 cm   LV E/e' lateral: 14.7      15.4 LV IVS:        1.10 cm  1.3 cm   LV e' medial:    4.79 cm/s 6.96 cm/s LVOT diam:     2.00 cm  2.0 cm   LV E/e' medial:  20.0      6.96 LV SV:         46 ml    79 ml LV SV Index:   26.64    45 ml/m2 LVOT Area:     3.14 cm 3.14 cm2    RIGHT VENTRICLE RV Basal diam:  2.49 cm RV S prime:     8.81 cm/s TAPSE (M-mode): 1.3 cm  LEFT ATRIUM             Index       RIGHT ATRIUM           Index LA diam:        5.00 cm 2.91 cm/m  RA Area:     13.30 cm LA Vol (A2C):   85.7 ml 49.96 ml/m RA Volume:   30.30 ml  17.66 ml/m LA Vol (A4C):   64.0 ml 37.31 ml/m LA Biplane Vol: 74.1 ml 43.20 ml/m  AORTIC VALVE                    Normals AV Area (Vmax):    1.24 cm AV Area (Vmean):   1.08 cm     3.06 cm2 AV Area (VTI):      1.12 cm AV Vmax:           334.00 cm/s AV Vmean:          228.667 cm/s 77 cm/s AV VTI:            0.800 m      3.15 cm2 AV Peak Grad:  44.6 mmHg AV Mean Grad:      24.0 mmHg    3 mmHg LVOT Vmax:         132.00 cm/s LVOT Vmean:        78.700 cm/s  75 cm/s LVOT VTI:          0.284 m      25.3 cm LVOT/AV VTI ratio: 0.36         1 AI PHT:            318 msec   AORTA                 Normals Ao Root diam: 2.40 cm 31 mm  MITRAL VALVE              Normals MV Area (PHT): 4.06 cm             SHUNTS MV PHT:        54.23 msec 55 ms     Systemic VTI:  0.28 m MV Decel Time: 187 msec   187 ms    Systemic Diam: 2.00 cm MV E velocity: 96.00 cm/s 103 cm/s MV A velocity: 60.00 cm/s 70.3 cm/s MV E/A ratio:  1.60       1.5  EKG:  EKG is ordered today.  This demonstrates NSR, rate 64 bpm. IVCD  Recent Labs: 06/21/2019: ALT 15; BUN 24; Creatinine, Ser 1.41; Potassium 4.6; Sodium 139   Lipid Panel    Component Value Date/Time   CHOL 149 06/21/2019 0953   TRIG 88 06/21/2019 0953   HDL 54 06/21/2019 0953   CHOLHDL 2.8 06/21/2019 0953   CHOLHDL 3.3 08/18/2015 1056   VLDL 26 08/18/2015 1056   LDLCALC 78 06/21/2019 0953     Wt Readings from Last 3 Encounters:  07/27/19 144 lb 6.4 oz (65.5 kg)  06/21/19 143 lb (64.9 kg)  03/05/19 144 lb 12.8 oz (65.7 kg)     Other studies Reviewed: Additional studies/ records that were reviewed today include:  Review of the above records demonstrates:    Assessment and Plan:   1. CAD s/p CABG with stable angina: He had CABG in 1993 and has had multiple stenting procedures since then. Last cath in June 2019 with stable CAD. The LIMA to his LAD is patent, stent from left main to Circumflex is patent, RCA occluded with filling by collaterals. He has no change in his chronic chest pain. Will continue Plavix, statin, Imdur, Norvasc and beta blocker. He is off of ASA due to bruising and easy bleeding.   2. Hyperlipidemia: LDL near goal. Continue statin.    3. Aortic valve disease with low flow/low gradient severe stenosis and moderately severe aortic valve insufficiency: He has new reduction in LV systolic function with at least moderate aortic valve stenosis and now severe aortic insufficiency. Will begin workup for AVR. I have personally reviewed the echo images. I think he would benefit from AVR. Given advanced age and multiple comorbid medical conditions, he is not a good candidate for conventional AVR by surgical approach. I think he may be a candidate for TAVR.   I have reviewed the natural history of aortic stenosis with the patient and their family members  who are present today. We have discussed the limitations of medical therapy and the poor prognosis associated with symptomatic aortic stenosis. We have reviewed potential treatment options, including palliative medical therapy, conventional surgical aortic valve replacement, and transcatheter aortic valve replacement. We discussed treatment options in  the context of the patient's specific comorbid medical conditions.   STS Risk score: Risk of Mortality: 5.858% Renal Failure: 2.334% Permanent Stroke: 1.137% Prolonged Ventilation: 19.999% DSW Infection: 0.178% Reoperation: 5.310% Morbidity or Mortality: 23.237% Short Length of Stay: 24.687% Long Length of Stay: 10.460%  He would like to proceed with planning for TAVR. I will arrange a right and left heart catheterization at [facility] 08/01/19. Risks and benefits of the cath procedure and the valve procedure reviewed with the patient. After the cath, he will have a cardiac CT, CTA of the chest/abdomen and pelvis, carotid dopplers and PT assessment and will then be referred to see one of the CT surgeons on our TAVR team.   4. HTN: BP is controlled.   5. Tobacco abuse: He does not wish to stop smoking. Smoking cessation is recommended.   6. Chronic diastolic CHF: Weight is stable. Volume status is ok.    Current medicines are  reviewed at length with the patient today.  The patient does not have concerns regarding medicines.  The following changes have been made:    Labs/ tests ordered today include:   Orders Placed This Encounter  Procedures   Basic metabolic panel   Protime-INR   CBC   EKG 12-Lead    Disposition:   FU with the valve team   Signed, Lauree Chandler, MD 07/27/2019 9:38 AM    [address], [place], [place]  [id number] Phone: [phone]; Fax: [phone]

## 2019-07-26 NOTE — Progress Notes (Signed)
Chief Complaint  Patient presents with   Follow-up    severe aortic stenosis   History of Present Illness: 73 yo male with history of CAD s/p CABG in 1993 with subsequent PCI/stenting, HTN, HLD, aortic stenosis, COPD, cardiomyopathy and ongoing tobacco abuse here today for follow up. He has severe CAD. He was admitted October 2013 and cath performed with occlusion of LAD, patent IMA to LAD, occluded proximal RCA with occluded SVG to PDA, severe disease in left main into Circumflex with occluded vein graft to Circumflex. Re-do CABG suggested but his aorta was severely calcified. He was seen by Dr. Servando Snare with CT surgery and not felt to be a candidate for re-do bypass. Imdur was added. No intervention performed. He was admitted October 2014 with a NSTEMI. Cardiac cath with 70% left main stenosis, LAD occluded, mid LAD 99 after anastomosis of the graft, prox Circumflex 50% , OM stent with 70% ISR, pRCA occluded, SVG-PDA occluded, SVG-OM occluded, LIMA-LAD patent. A drug eluting stent was placed in the mid LAD extending back into the IMA graft. He was readmitted May 2015 with an inferolateral STEMI and found to have progression of disease in the left main/proximal Circumflex stent. A drug eluting stent was placed from the Circumflex back into the left main. He was readmitted October 2015 with unstable angina. Cardiac cath with stenosis left main stent and OM 1. A drug eluting stent was placed in the first OM branch. The left main stent restenosis was treated with balloon angioplasty. Repeat caths in April 2016 and June 2016 with stable disease. Despite all of his coronary interventions, he continued to smoke. He has been tried on Ranexa but stopped due to cost. Echo June 2019 with LVEF=50-55% with moderate aortic stenosis (mean gradient 20 mmHg) and moderate aortic insufficiency. Right and left heart cath at [facility] 04/04/18 showed severe 3V disease with patency of the stent in the IMA/LAD, stents left main,  Circumflex and OM. The SVG to the RCA and SVG to the OM known to be occluded. Right heart pressures were normal. There was a mild gradient acros the aortic valve. He was seen by Dr. Melvyn Novas in 2016 and not felt to have COPD at that time. He was seen in October 2019 by Dr. Lake Bells in the pulmonary office and diagnosed with severe COPD.  I saw him September 2020 and he reported continued dyspnea at rest and with exertion. Echo 07/05/19 with LVEF=30-35%. Mild mitral regurgitation. The aortic valve leaflets are thickened and calcified. Mean gradient 24 mmHg, peak gradient 44.6 mmHg, AVA 1.08 cm2, dimensionless index 0.36. There is moderate to severe aortic insufficiency.   He is here today for follow up. The patient denies any palpitations, lower extremity edema, orthopnea, PND, dizziness, near syncope or syncope. He continues to have profound dyspnea. He has rare chest pains. He has progressive fatigue. He has no active dental issues. He lives with his wife in [place].    Primary Care Physician: Townsend Roger, MD  Past Medical History:  Diagnosis Date   Abnormal CT scan, kidney    07/2012 - multiple small cysts   Aortic stenosis    a. Mod AS/AI by cath 07/2012.;  b.  Echo (07/31/13) is: EF 55-60%, mild aortic stenosis (mean gradient 13);  c. Echo (5/15):  EF 40-45%, mild AS (mean 12 mmHg), mod AI   Back pain    Carotid artery occlusion    a. Dopplers 07/2012: no significant high grade obstruction.   Cholelithiasis  Seen on CT 07/2012   Coronary artery disease    a. CABG 10/1991. b. cath 07/2012 with prog dsz, turned down for re-do CABG - for med rx for now.; c. NSTEMI/PCI: DES to mLAD ext into IMA graft;  d. Inf STEMI (5/15):  LM 60-70% then 99% before LAD, pLAD occl, pCFX stent 80+% ISR, oRCA 99% then occl (L-R collats to dRCA), L-LAD ok with patent stent, S-CFX occl (old), S-RCA occl (old); PCI: LM ext into CFX with Xience Alpine DES   Heart failure ([facility]) systolic   Hyperlipidemia     Hyperlipoproteinemia    Hypertension    Ischemic cardiomyopathy    a. 2D ECHO: 08/02/2014; EF 45%; severe hypokinesis base/mid-inferolat segments and severe hypokinesis base inferior segment. Mild LVH, mild AS (may be underestimated due to LV dysfunction).  Mean gradient (S): 14 mm Hg. Peak gradient (S): 25 mm Hg. VTI ratio of LVOT to aortic valve: 0.37. Mod LA dilation. Mild RV systolic dysfxn     Past Surgical History:  Procedure Laterality Date   CARDIAC CATHETERIZATION  04/03/99   CARDIAC CATHETERIZATION N/A 03/20/2015   Procedure: Left Heart Cath and Cors/Grafts Angiography;  Surgeon: Jettie Booze, MD;  Location: [facility];  Service: Cardiovascular;  Laterality: N/A;   CARDIAC CATHETERIZATION N/A 04/28/2016   Procedure: Right/Left Heart Cath and Coronary/Graft Angiography;  Surgeon: Burnell Blanks, MD;  Location: [place];  Service: Cardiovascular;  Laterality: N/A;   CARPAL TUNNEL RELEASE  2007   Excision mass dorsal left wrist   CORONARY ARTERY BYPASS GRAFT  1993   FASCIOTOMY Right 07/30/2013   Procedure: OPEN FASCIOTOMY RIGHT RING FINGER & RIGHT SMALL FINGER MULTIPLE LEVELS;  Surgeon: Wynonia Sours, MD;  Location: [facility];  Service: Orthopedics;  Laterality: Right;   HERNIA REPAIR  1988   INGUINAL HERNIA REPAIR Left 06/07/2018   Procedure: LEFT INGUINAL HERNIA REPAIR;  Surgeon: [name], MD;  Location: [facility];  Service: General;  Laterality: Left;   INSERTION OF MESH Left 06/07/2018   Procedure: INSERTION OF MESH;  Surgeon: [name], MD;  Location: [facility];  Service: General;  Laterality: Left;   LEFT HEART CATHETERIZATION WITH CORONARY ANGIOGRAM N/A 02/28/2014   Procedure: LEFT HEART CATHETERIZATION WITH CORONARY ANGIOGRAM;  Surgeon: Troy Sine, MD;  Location: [facility] CATH LAB;  Service: Cardiovascular;  Laterality: N/A;   LEFT HEART CATHETERIZATION WITH CORONARY/GRAFT ANGIOGRAM N/A 08/10/2012   Procedure: LEFT HEART  CATHETERIZATION WITH Beatrix Fetters;  Surgeon: Hillary Bow, MD;  Location: [facility] CATH LAB;  Service: Cardiovascular;  Laterality: N/A;   LEFT HEART CATHETERIZATION WITH CORONARY/GRAFT ANGIOGRAM N/A 08/01/2014   Procedure: LEFT HEART CATHETERIZATION WITH Beatrix Fetters;  Surgeon: Leonie Man, MD;  Location: [facility] CATH LAB;  Service: Cardiovascular;  Laterality: N/A;   LEFT HEART CATHETERIZATION WITH CORONARY/GRAFT ANGIOGRAM N/A 02/12/2015   Procedure: LEFT HEART CATHETERIZATION WITH Beatrix Fetters;  Surgeon: Burnell Blanks, MD;  Location: [facility] CATH LAB;  Service: Cardiovascular;  Laterality: N/A;   PERCUTANEOUS CORONARY STENT INTERVENTION (PCI-S)  07/31/2013   Procedure: PERCUTANEOUS CORONARY STENT INTERVENTION (PCI-S);  Surgeon: Burnell Blanks, MD;  Location: [facility] CATH LAB;  Service: Cardiovascular;;  LIMA to LAD at the anastomosis with aortic root shot    RIGHT/LEFT HEART CATH AND CORONARY/GRAFT ANGIOGRAPHY N/A 04/04/2018   Procedure: RIGHT/LEFT HEART CATH AND CORONARY/GRAFT ANGIOGRAPHY;  Surgeon: Martinique, Peter M, MD;  Location: [facility];  Service: Cardiovascular;  Laterality: N/A;  Current Outpatient Medications  Medication Sig Dispense Refill   allopurinol (ZYLOPRIM) 100 MG tablet Take 100 mg by mouth daily.  4   amLODipine (NORVASC) 5 MG tablet TAKE ONE (1) TABLET BY MOUTH EVERY DAY 90 tablet 1   atorvastatin (LIPITOR) 80 MG tablet TAKE 1 TABLET BY MOUTH ONCE DAILY. 90 tablet 3   bisoprolol (ZEBETA) 5 MG tablet TAKE 1/2 TABLET BY MOUTH TWICE DAILY. 90 tablet 1   budesonide (PULMICORT) 0.25 MG/2ML nebulizer solution Take 2 mLs (0.25 mg total) by nebulization 2 (two) times daily. Dx: J44.9 120 mL 11   clopidogrel (PLAVIX) 75 MG tablet TAKE 1 TABLET BY MOUTH DAILY 90 tablet 1   fexofenadine (ALLEGRA) 180 MG tablet Take 180 mg by mouth daily as needed for allergies.      formoterol (PERFOROMIST) 20 MCG/2ML nebulizer solution Take  2 mLs (20 mcg total) by nebulization 2 (two) times daily. 120 mL 5   furosemide (LASIX) 40 MG tablet TAKE ONE TABLET BY MOUTH TWICE DAILY 180 tablet 1   isosorbide mononitrate (IMDUR) 60 MG 24 hr tablet TAKE ONE TABLET BY MOUTH TWICE DAILY 180 tablet 3   nitroGLYCERIN (NITROSTAT) 0.4 MG SL tablet TAKE 1 TABLET UNDER THE TONGUE AS NEEDEDFOR CHEST PAIN ( MAY REPEAT EVERY 5 MINUTES X 3) 25 tablet 3   pantoprazole (PROTONIX) 40 MG tablet TAKE ONE TABLET BY MOUTH ONCE DAILY 30 tablet 9   potassium chloride SA (K-DUR) 20 MEQ tablet TAKE 1 TABLET BY MOUTH DAILY 90 tablet 1   spironolactone (ALDACTONE) 25 MG tablet TAKE 1/2 TABLET BY MOUTH TWICE DAILY 90 tablet 1   No current facility-administered medications for this visit.     No Known Allergies  Social History   Socioeconomic History   Marital status: Married    Spouse name: Not on file   Number of children: 1   Years of education: Not on file   Highest education level: Not on file  Occupational History   Occupation: Plumbing    Comment: Retired in 2007  [place] resource strain: Not on file   Food insecurity    Worry: Not on file    Inability: Not on Lexicographer needs    Medical: Not on file    Non-medical: Not on file  Tobacco Use   Smoking status: Current Every Day Smoker    Packs/day: 1.50    Years: 57.00    Pack years: 85.50    Types: Cigarettes   Smokeless tobacco: Never Used  Substance and Sexual Activity   Alcohol use: Yes    Alcohol/week: 5.0 standard drinks    Types: 5 Cans of beer per week    Comment: daily-6 daily   Drug use: No   Sexual activity: Not on file  Lifestyle   Physical activity    Days per week: Not on file    Minutes per session: Not on file   Stress: Not on file  Relationships   Social connections    Talks on phone: Not on file    Gets together: Not on file    Attends religious service: Not on file    Active member of club or organization: Not  on file    Attends meetings of clubs or organizations: Not on file    Relationship status: Not on file   Intimate partner violence    Fear of current or ex partner: Not on file    Emotionally abused: Not on file  Physically abused: Not on file    Forced sexual activity: Not on file  Other Topics Concern   Not on file  Social History Narrative   Lives in [place] with his family.  Does not routinely exercise.    Family History  Problem Relation Age of Onset   Heart attack Mother        died @ 43.   Hypertension Mother    Hypertension Father    Hypertension Sister    Emphysema Brother    Hypertension Brother    Allergies Sister    Stroke Neg Hx     Review of Systems:  As stated in the HPI and otherwise negative.   BP (!) 144/96    Pulse 64    Ht 5\' 5"  (1.651 m)    Wt 144 lb 6.4 oz (65.5 kg)    SpO2 99%    BMI 24.03 kg/m   Physical Examination:  General: Well developed, well nourished, NAD  HEENT: OP clear, mucus membranes moist  SKIN: warm, dry. No rashes. Neuro: No focal deficits  Musculoskeletal: Muscle strength 5/5 all ext  Psychiatric: Mood and affect normal  Neck: No JVD, no carotid bruits, no thyromegaly, no lymphadenopathy.  Lungs: Clear bilaterally, no wheezes, rhonci, crackles Cardiovascular: Regular rate and rhythm. Harsh systolic murmur noted.  Abdomen:Soft. Bowel sounds present. Non-tender.  Extremities: No lower extremity edema.   Cardiac Cath June 2019:    Non-stenotic Ost LM to LM lesion previously treated.  Ost LAD to Prox LAD lesion is 100% stenosed.  Ost Cx to Prox Cx lesion is 50% stenosed.  Ost RCA to Prox RCA lesion is 100% stenosed.  Previously placed Dist LAD stent (unknown type) is widely patent.  Previously placed Ost 1st Mrg stent (unknown type) is widely patent.  LV end diastolic pressure is normal.  Patent LIMA to LAD  Occluded SVG to OM  Occluded SVG to RCA  Diagnostic Dominance:  Co-dominant  Intervention    Echo 07/05/19:  1. Left ventricular ejection fraction, by visual estimation, is 30 to 35%. The left ventricle has moderate to severely decreased function. Left ventricular septal wall thickness was normal. Normal left ventricular posterior wall thickness. There is no  left ventricular hypertrophy.  2. Left ventricular diastolic Doppler parameters are consistent with pseudonormalization pattern of LV diastolic filling.  3. GLS = -13.2%.       4. Global right ventricle has normal systolic function.The right ventricular size is normal. No increase in right ventricular wall thickness.  5. Left atrial size was moderately dilated.  6. Right atrial size was normal.  7. The mitral valve is normal in structure. Mild mitral valve regurgitation.  8. The tricuspid valve is normal in structure. Tricuspid valve regurgitation was not visualized by color flow Doppler.  9. The aortic valve The aortic valve has an indeterminant number of cusps Aortic valve regurgitation is moderate to severe by color flow Doppler. Moderate aortic valve stenosis. 10. The pulmonic valve was normal in structure. Pulmonic valve regurgitation is trivial by color flow Doppler. 11. The atrial septum is grossly normal.  FINDINGS  Left Ventricle: Left ventricular ejection fraction, by visual estimation, is 30 to 35%. The left ventricle has moderate to severely decreased function. Left ventricular septal wall thickness was normal. Normal left ventricular posterior wall thickness.  There is no left ventricular hypertrophy. Spectral Doppler shows Left ventricular diastolic Doppler parameters are consistent with pseudonormalization pattern of LV diastolic filling. GLS = -13.2%.  Right Ventricle: The right  ventricular size is normal. No increase in right ventricular wall thickness. Global RV systolic function is has normal systolic function.  Left Atrium: Left atrial size was moderately dilated.  Right  Atrium: Right atrial size was normal in size  Pericardium: There is no evidence of pericardial effusion.  Mitral Valve: The mitral valve is normal in structure. Mild mitral valve regurgitation.  Tricuspid Valve: The tricuspid valve is normal in structure. Tricuspid valve regurgitation was not visualized by color flow Doppler.  Aortic Valve: The aortic valve The aortic valve has an indeterminant number of cusps. Aortic valve regurgitation is moderate to severe by color flow Doppler. Aortic regurgitation PHT measures 318 msec. Moderate aortic stenosis is present. Aortic valve  mean gradient measures 24.0 mmHg. Aortic valve peak gradient measures 44.6 mmHg. Aortic valve area, by VTI measures 1.12 cm.  Pulmonic Valve: The pulmonic valve was normal in structure. Pulmonic valve regurgitation is trivial by color flow Doppler.  Aorta: The aortic root is normal in size and structure.  Shunts: The atrial septum is grossly normal.    LEFT VENTRICLE          Normals PLAX 2D LVIDd:         5.60 cm  3.6 cm   Diastology                 Normals LVIDs:         4.80 cm  1.7 cm   LV e' lateral:   6.53 cm/s 6.42 cm/s LV PW:         0.90 cm  1.4 cm   LV E/e' lateral: 14.7      15.4 LV IVS:        1.10 cm  1.3 cm   LV e' medial:    4.79 cm/s 6.96 cm/s LVOT diam:     2.00 cm  2.0 cm   LV E/e' medial:  20.0      6.96 LV SV:         46 ml    79 ml LV SV Index:   26.64    45 ml/m2 LVOT Area:     3.14 cm 3.14 cm2    RIGHT VENTRICLE RV Basal diam:  2.49 cm RV S prime:     8.81 cm/s TAPSE (M-mode): 1.3 cm  LEFT ATRIUM             Index       RIGHT ATRIUM           Index LA diam:        5.00 cm 2.91 cm/m  RA Area:     13.30 cm LA Vol (A2C):   85.7 ml 49.96 ml/m RA Volume:   30.30 ml  17.66 ml/m LA Vol (A4C):   64.0 ml 37.31 ml/m LA Biplane Vol: 74.1 ml 43.20 ml/m  AORTIC VALVE                    Normals AV Area (Vmax):    1.24 cm AV Area (Vmean):   1.08 cm     3.06 cm2 AV Area (VTI):      1.12 cm AV Vmax:           334.00 cm/s AV Vmean:          228.667 cm/s 77 cm/s AV VTI:            0.800 m      3.15 cm2 AV Peak Grad:  44.6 mmHg AV Mean Grad:      24.0 mmHg    3 mmHg LVOT Vmax:         132.00 cm/s LVOT Vmean:        78.700 cm/s  75 cm/s LVOT VTI:          0.284 m      25.3 cm LVOT/AV VTI ratio: 0.36         1 AI PHT:            318 msec   AORTA                 Normals Ao Root diam: 2.40 cm 31 mm  MITRAL VALVE              Normals MV Area (PHT): 4.06 cm             SHUNTS MV PHT:        54.23 msec 55 ms     Systemic VTI:  0.28 m MV Decel Time: 187 msec   187 ms    Systemic Diam: 2.00 cm MV E velocity: 96.00 cm/s 103 cm/s MV A velocity: 60.00 cm/s 70.3 cm/s MV E/A ratio:  1.60       1.5  EKG:  EKG is ordered today.  This demonstrates NSR, rate 64 bpm. IVCD  Recent Labs: 06/21/2019: ALT 15; BUN 24; Creatinine, Ser 1.41; Potassium 4.6; Sodium 139   Lipid Panel    Component Value Date/Time   CHOL 149 06/21/2019 0953   TRIG 88 06/21/2019 0953   HDL 54 06/21/2019 0953   CHOLHDL 2.8 06/21/2019 0953   CHOLHDL 3.3 08/18/2015 1056   VLDL 26 08/18/2015 1056   LDLCALC 78 06/21/2019 0953     Wt Readings from Last 3 Encounters:  07/27/19 144 lb 6.4 oz (65.5 kg)  06/21/19 143 lb (64.9 kg)  03/05/19 144 lb 12.8 oz (65.7 kg)     Other studies Reviewed: Additional studies/ records that were reviewed today include:  Review of the above records demonstrates:    Assessment and Plan:   1. CAD s/p CABG with stable angina: He had CABG in 1993 and has had multiple stenting procedures since then. Last cath in June 2019 with stable CAD. The LIMA to his LAD is patent, stent from left main to Circumflex is patent, RCA occluded with filling by collaterals. He has no change in his chronic chest pain. Will continue Plavix, statin, Imdur, Norvasc and beta blocker. He is off of ASA due to bruising and easy bleeding.   2. Hyperlipidemia: LDL near goal. Continue statin.    3. Aortic valve disease with low flow/low gradient severe stenosis and moderately severe aortic valve insufficiency: He has new reduction in LV systolic function with at least moderate aortic valve stenosis and now severe aortic insufficiency. Will begin workup for AVR. I have personally reviewed the echo images. I think he would benefit from AVR. Given advanced age and multiple comorbid medical conditions, he is not a good candidate for conventional AVR by surgical approach. I think he may be a candidate for TAVR.   I have reviewed the natural history of aortic stenosis with the patient and their family members  who are present today. We have discussed the limitations of medical therapy and the poor prognosis associated with symptomatic aortic stenosis. We have reviewed potential treatment options, including palliative medical therapy, conventional surgical aortic valve replacement, and transcatheter aortic valve replacement. We discussed treatment options in  the context of the patient's specific comorbid medical conditions.   STS Risk score: Risk of Mortality: 5.858% Renal Failure: 2.334% Permanent Stroke: 1.137% Prolonged Ventilation: 19.999% DSW Infection: 0.178% Reoperation: 5.310% Morbidity or Mortality: 23.237% Short Length of Stay: 24.687% Long Length of Stay: 10.460%  He would like to proceed with planning for TAVR. I will arrange a right and left heart catheterization at [facility] 08/01/19. Risks and benefits of the cath procedure and the valve procedure reviewed with the patient. After the cath, he will have a cardiac CT, CTA of the chest/abdomen and pelvis, carotid dopplers and PT assessment and will then be referred to see one of the CT surgeons on our TAVR team.   4. HTN: BP is controlled.   5. Tobacco abuse: He does not wish to stop smoking. Smoking cessation is recommended.   6. Chronic diastolic CHF: Weight is stable. Volume status is ok.    Current medicines are  reviewed at length with the patient today.  The patient does not have concerns regarding medicines.  The following changes have been made:    Labs/ tests ordered today include:   Orders Placed This Encounter  Procedures   Basic metabolic panel   Protime-INR   CBC   EKG 12-Lead    Disposition:   FU with the valve team   Signed, Lauree Chandler, MD 07/27/2019 9:38 AM    [address], [place], [place]  [address] Phone: [phone]; Fax: [phone]

## 2019-07-27 ENCOUNTER — Encounter: Payer: Self-pay | Admitting: Cardiothoracic Surgery

## 2019-07-27 ENCOUNTER — Other Ambulatory Visit: Payer: Self-pay

## 2019-07-27 ENCOUNTER — Encounter: Payer: Self-pay | Admitting: Cardiovascular Disease

## 2019-07-27 ENCOUNTER — Ambulatory Visit (INDEPENDENT_AMBULATORY_CARE_PROVIDER_SITE_OTHER): Payer: Medicare Other | Admitting: Cardiovascular Disease

## 2019-07-27 ENCOUNTER — Encounter: Payer: Self-pay | Admitting: *Deleted

## 2019-07-27 VITALS — BP 144/96 | HR 64 | Ht 65.0 in | Wt 144.4 lb

## 2019-07-27 DIAGNOSIS — Z01812 Encounter for preprocedural laboratory examination: Secondary | ICD-10-CM

## 2019-07-27 DIAGNOSIS — I350 Nonrheumatic aortic (valve) stenosis: Secondary | ICD-10-CM

## 2019-07-27 DIAGNOSIS — I255 Ischemic cardiomyopathy: Secondary | ICD-10-CM | POA: Diagnosis not present

## 2019-07-27 DIAGNOSIS — I351 Nonrheumatic aortic (valve) insufficiency: Secondary | ICD-10-CM

## 2019-07-27 LAB — CBC
Hematocrit: 43.7 % (ref 37.5–51.0)
Hemoglobin: 14.7 g/dL (ref 13.0–17.7)
MCH: 32.3 pg (ref 26.6–33.0)
MCHC: 33.6 g/dL (ref 31.5–35.7)
MCV: 96 fL (ref 79–97)
Platelets: 215 10*3/uL (ref 150–450)
RBC: 4.55 x10E6/uL (ref 4.14–5.80)
RDW: 11.7 % (ref 11.6–15.4)
WBC: 11.2 10*3/uL — ABNORMAL HIGH (ref 3.4–10.8)

## 2019-07-27 LAB — BASIC METABOLIC PANEL
BUN/Creatinine Ratio: 16 (ref 10–24)
BUN: 19 mg/dL (ref 8–27)
CO2: 23 mmol/L (ref 20–29)
Calcium: 9.6 mg/dL (ref 8.6–10.2)
Chloride: 100 mmol/L (ref 96–106)
Creatinine, Ser: 1.19 mg/dL (ref 0.76–1.27)
GFR calc Af Amer: 69 mL/min/{1.73_m2} (ref >59)
GFR calc non Af Amer: 59 mL/min/{1.73_m2} — ABNORMAL LOW (ref >59)
Glucose: 102 mg/dL — ABNORMAL HIGH (ref 65–99)
Potassium: 4.6 mmol/L (ref 3.5–5.2)
Sodium: 138 mmol/L (ref 134–144)

## 2019-07-27 LAB — PROTIME-INR
INR: 1 (ref 0.9–1.2)
Prothrombin Time: 10.4 s (ref 9.1–12.0)

## 2019-07-27 NOTE — Patient Instructions (Signed)
Your physician recommends that you continue on your current medications as directed. Please refer to the Current Medication list given to you today.  Your physician recommends that you return for lab work in:  [place] AND INR  Your physician has requested that you have a cardiac catheterization. Cardiac catheterization is used to diagnose and/or treat various heart conditions. Doctors may recommend this procedure for a number of different reasons. The most common reason is to evaluate chest pain. Chest pain can be a symptom of coronary artery disease (CAD), and cardiac catheterization can show whether plaque is narrowing or blocking your heart's arteries. This procedure is also used to evaluate the valves, as well as measure the blood flow and oxygen levels in different parts of your heart. For further information please visit [email or web address]. Please follow instruction sheet, as given. Your physician recommends that you schedule a follow-up appointment in:  [facility] CATH FINDINGS

## 2019-07-30 ENCOUNTER — Other Ambulatory Visit (HOSPITAL_COMMUNITY)
Admission: RE | Admit: 2019-07-30 | Discharge: 2019-07-30 | Disposition: A | Discharge status: Home / Self Care | Payer: Medicare Other | Source: Ambulatory Visit | Attending: Cardiovascular Disease | Admitting: Cardiovascular Disease

## 2019-07-30 DIAGNOSIS — Z20828 Contact with and (suspected) exposure to other viral communicable diseases: Secondary | ICD-10-CM | POA: Diagnosis not present

## 2019-07-30 DIAGNOSIS — Z01812 Encounter for preprocedural laboratory examination: Secondary | ICD-10-CM | POA: Diagnosis not present

## 2019-07-30 LAB — SARS CORONAVIRUS 2 (TAT 6-24 HRS): SARS Coronavirus 2: NEGATIVE

## 2019-07-31 ENCOUNTER — Telehealth: Payer: Self-pay | Admitting: *Deleted

## 2019-07-31 ENCOUNTER — Other Ambulatory Visit: Payer: Self-pay

## 2019-07-31 DIAGNOSIS — I350 Nonrheumatic aortic (valve) stenosis: Secondary | ICD-10-CM

## 2019-07-31 NOTE — Telephone Encounter (Addendum)
Pt contacted pre-catheterization scheduled at [facility] [place] for: Wednesday August 01, 2019 11:30 AM Verified arrival time and place: [facility]) at: 9:30 AM   No solid food after midnight prior to cath, clear liquids until 5 AM day of procedure. Contrast allergy:  no  Hold: Lasix/KCl -AM of procedure-GFR 59 Spironolactone -PM prior and AM  of procedure-GFR 59   Except hold medications AM meds can be  taken pre-cath with sip of water including: Plavix 75 mg ASA 81 mg-pt is comfortable with taking AM of procedure.   Confirmed patient has responsible adult to drive home post procedure and observe 24 hours after arriving home-yes  Currently, due to Covid-19 pandemic, only one support person will be allowed with patient. Must be the same support person for that patient's entire stay, will be screened and required to wear a mask. They will be asked to wait in the waiting room for the duration of the patient's stay.  Patients are required to wear a mask when they enter the hospital.     COVID-19 Pre-Screening Questions:  . In the past 7 to 10 days have you had a cough,  shortness of breath, headache, congestion, fever (100 or greater) body aches, chills, sore throat, or sudden loss of taste or sense of smell? no . Have you been around anyone with known Covid 19? no . Have you been around anyone who is awaiting Covid 19 test results in the past 7 to 10 days? no . Have you been around anyone who has been exposed to Covid 19, or has mentioned symptoms of Covid 19 within the past 7 to 10 days? no  I reviewed procedure/mask/visitor instructions, Covid-19 screening questions with patient, he verbalized understanding, thanked me for call.

## 2019-07-31 NOTE — Telephone Encounter (Signed)
Follow up   Patient is returning your call for pre-cath instructions. Please call.

## 2019-08-01 ENCOUNTER — Ambulatory Visit (HOSPITAL_COMMUNITY)
Admission: RE | Admit: 2019-08-01 | Discharge: 2019-08-01 | Disposition: A | Discharge status: Home / Self Care | Payer: Medicare Other | Attending: Cardiovascular Disease | Admitting: Cardiovascular Disease

## 2019-08-01 ENCOUNTER — Encounter (HOSPITAL_COMMUNITY)
Admission: RE | Disposition: A | Discharge status: Home / Self Care | Payer: Self-pay | Source: Home / Self Care | Attending: Cardiovascular Disease

## 2019-08-01 ENCOUNTER — Other Ambulatory Visit: Payer: Self-pay

## 2019-08-01 DIAGNOSIS — Z79899 Other long term (current) drug therapy: Secondary | ICD-10-CM | POA: Diagnosis not present

## 2019-08-01 DIAGNOSIS — Z951 Presence of aortocoronary bypass graft: Secondary | ICD-10-CM | POA: Diagnosis not present

## 2019-08-01 DIAGNOSIS — I5032 Chronic diastolic (congestive) heart failure: Secondary | ICD-10-CM | POA: Insufficient documentation

## 2019-08-01 DIAGNOSIS — Z955 Presence of coronary angioplasty implant and graft: Secondary | ICD-10-CM | POA: Diagnosis not present

## 2019-08-01 DIAGNOSIS — F17210 Nicotine dependence, cigarettes, uncomplicated: Secondary | ICD-10-CM | POA: Diagnosis not present

## 2019-08-01 DIAGNOSIS — I255 Ischemic cardiomyopathy: Secondary | ICD-10-CM | POA: Diagnosis not present

## 2019-08-01 DIAGNOSIS — I25810 Atherosclerosis of coronary artery bypass graft(s) without angina pectoris: Secondary | ICD-10-CM | POA: Diagnosis not present

## 2019-08-01 DIAGNOSIS — I252 Old myocardial infarction: Secondary | ICD-10-CM | POA: Diagnosis not present

## 2019-08-01 DIAGNOSIS — Z8249 Family history of ischemic heart disease and other diseases of the circulatory system: Secondary | ICD-10-CM | POA: Insufficient documentation

## 2019-08-01 DIAGNOSIS — I350 Nonrheumatic aortic (valve) stenosis: Secondary | ICD-10-CM | POA: Diagnosis not present

## 2019-08-01 DIAGNOSIS — Z7951 Long term (current) use of inhaled steroids: Secondary | ICD-10-CM | POA: Insufficient documentation

## 2019-08-01 DIAGNOSIS — J449 Chronic obstructive pulmonary disease, unspecified: Secondary | ICD-10-CM | POA: Insufficient documentation

## 2019-08-01 DIAGNOSIS — Z7902 Long term (current) use of antithrombotics/antiplatelets: Secondary | ICD-10-CM | POA: Diagnosis not present

## 2019-08-01 DIAGNOSIS — I2510 Atherosclerotic heart disease of native coronary artery without angina pectoris: Secondary | ICD-10-CM | POA: Diagnosis not present

## 2019-08-01 DIAGNOSIS — I352 Nonrheumatic aortic (valve) stenosis with insufficiency: Secondary | ICD-10-CM | POA: Insufficient documentation

## 2019-08-01 DIAGNOSIS — E785 Hyperlipidemia, unspecified: Secondary | ICD-10-CM | POA: Insufficient documentation

## 2019-08-01 DIAGNOSIS — I110 Hypertensive heart disease with heart failure: Secondary | ICD-10-CM | POA: Insufficient documentation

## 2019-08-01 HISTORY — PX: RIGHT/LEFT HEART CATH AND CORONARY/GRAFT ANGIOGRAPHY: CATH118267

## 2019-08-01 LAB — POCT I-STAT EG7
Acid-base deficit: 3 mmol/L — ABNORMAL HIGH (ref 0.0–2.0)
Bicarbonate: 23.1 mmol/L (ref 20.0–28.0)
Calcium, Ion: 1.16 mmol/L (ref 1.15–1.40)
HCT: 39 % (ref 39.0–52.0)
Hemoglobin: 13.3 g/dL (ref 13.0–17.0)
O2 Saturation: 67 %
Potassium: 3.7 mmol/L (ref 3.5–5.1)
Sodium: 142 mmol/L (ref 135–145)
TCO2: 24 mmol/L (ref 22–32)
pCO2, Ven: 44.1 mmHg (ref 44.0–60.0)
pH, Ven: 7.328 (ref 7.250–7.430)
pO2, Ven: 37 mmHg (ref 32.0–45.0)

## 2019-08-01 LAB — POCT I-STAT 7, (LYTES, BLD GAS, ICA,H+H)
Acid-base deficit: 3 mmol/L — ABNORMAL HIGH (ref 0.0–2.0)
Bicarbonate: 22.3 mmol/L (ref 20.0–28.0)
Calcium, Ion: 1.16 mmol/L (ref 1.15–1.40)
HCT: 39 % (ref 39.0–52.0)
Hemoglobin: 13.3 g/dL (ref 13.0–17.0)
O2 Saturation: 99 %
Potassium: 3.7 mmol/L (ref 3.5–5.1)
Sodium: 141 mmol/L (ref 135–145)
TCO2: 23 mmol/L (ref 22–32)
pCO2 arterial: 39.1 mmHg (ref 32.0–48.0)
pH, Arterial: 7.364 (ref 7.350–7.450)
pO2, Arterial: 136 mmHg — ABNORMAL HIGH (ref 83.0–108.0)

## 2019-08-01 SURGERY — RIGHT/LEFT HEART CATH AND CORONARY/GRAFT ANGIOGRAPHY
Anesthesia: LOCAL

## 2019-08-01 MED ORDER — FENTANYL CITRATE (PF) 100 MCG/2ML IJ SOLN
INTRAMUSCULAR | Status: DC | PRN
  Administered 2019-08-01 (×2): 25 ug via INTRAVENOUS

## 2019-08-01 MED ORDER — SODIUM CHLORIDE 0.9% FLUSH: 3.0000 mL | Freq: Two times a day (BID) | INTRAVENOUS | Status: DC

## 2019-08-01 MED ORDER — LABETALOL HCL 5 MG/ML IV SOLN: 10.0000 mg | INTRAVENOUS | Status: DC | PRN

## 2019-08-01 MED ORDER — IOHEXOL 350 MG/ML SOLN
INTRAVENOUS | Status: DC | PRN
  Administered 2019-08-01: 12:00:00 45 mL via INTRA_ARTERIAL

## 2019-08-01 MED ORDER — SODIUM CHLORIDE 0.9% FLUSH: 3.0000 mL | INTRAVENOUS | Status: DC | PRN

## 2019-08-01 MED ORDER — FENTANYL CITRATE (PF) 100 MCG/2ML IJ SOLN
INTRAMUSCULAR | Status: AC
  Filled 2019-08-01: qty 2

## 2019-08-01 MED ORDER — HEPARIN (PORCINE) IN NACL 1000-0.9 UT/500ML-% IV SOLN
INTRAVENOUS | Status: AC
  Filled 2019-08-01: qty 1000

## 2019-08-01 MED ORDER — HYDRALAZINE HCL 20 MG/ML IJ SOLN: 10.0000 mg | INTRAMUSCULAR | Status: DC | PRN

## 2019-08-01 MED ORDER — MIDAZOLAM HCL 2 MG/2ML IJ SOLN
INTRAMUSCULAR | Status: DC | PRN
  Administered 2019-08-01 (×2): 1 mg via INTRAVENOUS

## 2019-08-01 MED ORDER — LIDOCAINE HCL (PF) 1 % IJ SOLN
INTRAMUSCULAR | Status: AC
  Filled 2019-08-01: qty 30

## 2019-08-01 MED ORDER — ASPIRIN 81 MG PO CHEW: 81.0000 mg | CHEWABLE_TABLET | ORAL | Status: DC

## 2019-08-01 MED ORDER — SODIUM CHLORIDE 0.9 % IV SOLN: 250.0000 mL | INTRAVENOUS | Status: DC | PRN

## 2019-08-01 MED ORDER — SODIUM CHLORIDE 0.9 % WEIGHT BASED INFUSION
3.0000 mL/kg/h | INTRAVENOUS | Status: AC
  Administered 2019-08-01: 3 mL/kg/h via INTRAVENOUS

## 2019-08-01 MED ORDER — LIDOCAINE HCL (PF) 1 % IJ SOLN
INTRAMUSCULAR | Status: DC | PRN
  Administered 2019-08-01: 15 mL via SUBCUTANEOUS

## 2019-08-01 MED ORDER — MIDAZOLAM HCL 2 MG/2ML IJ SOLN
INTRAMUSCULAR | Status: AC
  Filled 2019-08-01: qty 2

## 2019-08-01 MED ORDER — SODIUM CHLORIDE 0.9 % IV SOLN: INTRAVENOUS | Status: AC

## 2019-08-01 MED ORDER — ONDANSETRON HCL 4 MG/2ML IJ SOLN: 4.0000 mg | Freq: Four times a day (QID) | INTRAMUSCULAR | Status: DC | PRN

## 2019-08-01 MED ORDER — HEPARIN (PORCINE) IN NACL 1000-0.9 UT/500ML-% IV SOLN
INTRAVENOUS | Status: DC | PRN
  Administered 2019-08-01 (×2): 500 mL

## 2019-08-01 MED ORDER — SODIUM CHLORIDE 0.9 % WEIGHT BASED INFUSION: 1.0000 mL/kg/h | INTRAVENOUS | Status: DC

## 2019-08-01 MED ORDER — ACETAMINOPHEN 325 MG PO TABS: 650.0000 mg | ORAL_TABLET | ORAL | Status: DC | PRN

## 2019-08-01 SURGICAL SUPPLY — 11 items
CATH INFINITI 5 FR AL2 (CATHETERS) ×1 IMPLANT
CATH INFINITI 5FR MULTPACK ANG (CATHETERS) ×1 IMPLANT
CATH SWAN GANZ 7F STRAIGHT (CATHETERS) ×1 IMPLANT
KIT HEART LEFT (KITS) ×2 IMPLANT
PACK CARDIAC CATHETERIZATION (CUSTOM PROCEDURE TRAY) ×2 IMPLANT
SHEATH PINNACLE 5F 10CM (SHEATH) ×1 IMPLANT
SHEATH PINNACLE 7F 10CM (SHEATH) ×2 IMPLANT
TRANSDUCER W/STOPCOCK (MISCELLANEOUS) ×2 IMPLANT
TUBING CIL FLEX 10 FLL-RA (TUBING) ×2 IMPLANT
WIRE EMERALD 3MM-J .035X150CM (WIRE) ×1 IMPLANT
WIRE EMERALD ST .035X150CM (WIRE) ×1 IMPLANT

## 2019-08-01 NOTE — Progress Notes (Signed)
No bleeding or swelling noted after ambulation 

## 2019-08-01 NOTE — Progress Notes (Signed)
Site area: Right groin a 5 arterial and 7 venous sheath was removed by Caron Presume RN  Site Prior to Removal:  Level 0  Pressure Applied For 20 MINUTES    Bedrest Beginning at 1245pm  Manual:   Yes.    Patient Status During Pull:  stable  Post Pull Groin Site:  Level 0  Post Pull Instructions Given:  Yes.    Post Pull Pulses Present:  Yes.    Dressing Applied:  Yes.    Comments:

## 2019-08-01 NOTE — Interval H&P Note (Signed)
History and Physical Interval Note:  08/01/2019 10:06 AM  Steve Durham  has presented today for surgery, with the diagnosis of AI.  The various methods of treatment have been discussed with the patient and family. After consideration of risks, benefits and other options for treatment, the patient has consented to  Procedure(s): RIGHT/LEFT HEART CATH AND CORONARY/GRAFT ANGIOGRAPHY (N/A) as a surgical intervention.  The patient's history has been reviewed, patient examined, no change in status, stable for surgery.  I have reviewed the patient's chart and labs.  Questions were answered to the patient's satisfaction.    Cath Lab Visit (complete for each Cath Lab visit)  Clinical Evaluation Leading to the Procedure:   ACS: No.  Non-ACS:    Anginal Classification: CCS III  Anti-ischemic medical therapy: Maximal Therapy (2 or more classes of medications)  Non-Invasive Test Results: No non-invasive testing performed  Prior CABG: Previous CABG        Lauree Chandler

## 2019-08-01 NOTE — Discharge Instructions (Signed)
Femoral Site Care °This sheet gives you information about how to care for yourself after your procedure. Your health care provider may also give you more specific instructions. If you have problems or questions, contact your health care provider. °What can I expect after the procedure? °After the procedure, it is common to have: °· Bruising that usually fades within 1-2 weeks. °· Tenderness at the site. °Follow these instructions at home: °Wound care °· Follow instructions from your health care provider about how to take care of your insertion site. Make sure you: °? Wash your hands with soap and water before you change your bandage (dressing). If soap and water are not available, use hand sanitizer. °? Change your dressing as told by your health care provider. °? Leave stitches (sutures), skin glue, or adhesive strips in place. These skin closures may need to stay in place for 2 weeks or longer. If adhesive strip edges start to loosen and curl up, you may trim the loose edges. Do not remove adhesive strips completely unless your health care provider tells you to do that. °· Do not take baths, swim, or use a hot tub until your health care provider approves. °· You may shower 24-48 hours after the procedure or as told by your health care provider. °? Gently wash the site with plain soap and water. °? Pat the area dry with a clean towel. °? Do not rub the site. This may cause bleeding. °· Do not apply powder or lotion to the site. Keep the site clean and dry. °· Check your femoral site every day for signs of infection. Check for: °? Redness, swelling, or pain. °? Fluid or blood. °? Warmth. °? Pus or a bad smell. °Activity °· For the first 2-3 days after your procedure, or as long as directed: °? Avoid climbing stairs as much as possible. °? Do not squat. °· Do not lift anything that is heavier than 10 lb (4.5 kg), or the limit that you are told, until your health care provider says that it is safe. °· Rest as  directed. °? Avoid sitting for a long time without moving. Get up to take short walks every 1-2 hours. °· Do not drive for 24 hours if you were given a medicine to help you relax (sedative). °General instructions °· Take over-the-counter and prescription medicines only as told by your health care provider. °· Keep all follow-up visits as told by your health care provider. This is important. °Contact a health care provider if you have: °· A fever or chills. °· You have redness, swelling, or pain around your insertion site. °Get help right away if: °· The catheter insertion area swells very fast. °· You pass out. °· You suddenly start to sweat or your skin gets clammy. °· The catheter insertion area is bleeding, and the bleeding does not stop when you hold steady pressure on the area. °· The area near or just beyond the catheter insertion site becomes pale, cool, tingly, or numb. °These symptoms may represent a serious problem that is an emergency. Do not wait to see if the symptoms will go away. Get medical help right away. Call your local emergency services (911 in the [place] Do not drive yourself to the hospital. °Summary °· After the procedure, it is common to have bruising that usually fades within 1-2 weeks. °· Check your femoral site every day for signs of infection. °· Do not lift anything that is heavier than 10 lb (4.5 kg), or the   limit that you are told, until your health care provider says that it is safe. °This information is not intended to replace advice given to you by your health care provider. Make sure you discuss any questions you have with your health care provider. °Document Released: 06/07/2014 Document Revised: 10/17/2017 Document Reviewed: 10/17/2017 °Elsevier Patient Education © 2020 [employer] ° °

## 2019-08-01 NOTE — Research (Signed)
PHDE Informed Consent   Subject Name: Steve Durham  Subject met inclusion and exclusion criteria.  The informed consent form, study requirements and expectations were reviewed with the subject and questions and concerns were addressed prior to the signing of the consent form.  The subject verbalized understanding of the trail requirements.  The subject agreed to participate in the PHDE trial and signed the informed consent.  The informed consent was obtained prior to performance of any protocol-specific procedures for the subject.  A copy of the signed informed consent was given to the subject and a copy was placed in the subject's medical record.  Neva Seat 08/01/2019, 10:28 AM

## 2019-08-02 ENCOUNTER — Other Ambulatory Visit: Payer: Self-pay | Admitting: Cardiovascular Disease

## 2019-08-02 ENCOUNTER — Encounter (HOSPITAL_COMMUNITY): Payer: Self-pay | Admitting: Cardiovascular Disease

## 2019-08-03 ENCOUNTER — Ambulatory Visit (HOSPITAL_COMMUNITY): Payer: Medicare Other

## 2019-08-03 ENCOUNTER — Other Ambulatory Visit (HOSPITAL_COMMUNITY): Payer: Medicare Other

## 2019-08-06 ENCOUNTER — Encounter: Payer: Medicare Other | Admitting: Thoracic Surgery (Cardiothoracic Vascular Surgery)

## 2019-08-10 ENCOUNTER — Other Ambulatory Visit (HOSPITAL_COMMUNITY): Payer: Medicare Other

## 2019-08-10 ENCOUNTER — Ambulatory Visit: Payer: Medicare Other | Admitting: Physical Therapy

## 2019-08-21 ENCOUNTER — Ambulatory Visit (HOSPITAL_COMMUNITY)
Admission: RE | Admit: 2019-08-21 | Discharge: 2019-08-21 | Disposition: A | Discharge status: Home / Self Care | Payer: Medicare Other | Source: Ambulatory Visit | Attending: Cardiovascular Disease | Admitting: Cardiovascular Disease

## 2019-08-21 ENCOUNTER — Encounter: Payer: Self-pay | Admitting: Physical Therapy

## 2019-08-21 ENCOUNTER — Other Ambulatory Visit: Payer: Self-pay

## 2019-08-21 ENCOUNTER — Ambulatory Visit: Payer: Medicare Other | Attending: Cardiovascular Disease | Admitting: Physical Therapy

## 2019-08-21 ENCOUNTER — Ambulatory Visit (HOSPITAL_BASED_OUTPATIENT_CLINIC_OR_DEPARTMENT_OTHER)
Admission: RE | Admit: 2019-08-21 | Discharge: 2019-08-21 | Disposition: A | Discharge status: Home / Self Care | Payer: Medicare Other | Source: Ambulatory Visit | Attending: Cardiovascular Disease | Admitting: Cardiovascular Disease

## 2019-08-21 DIAGNOSIS — I714 Abdominal aortic aneurysm, without rupture: Secondary | ICD-10-CM | POA: Diagnosis not present

## 2019-08-21 DIAGNOSIS — R2689 Other abnormalities of gait and mobility: Secondary | ICD-10-CM | POA: Insufficient documentation

## 2019-08-21 DIAGNOSIS — I350 Nonrheumatic aortic (valve) stenosis: Secondary | ICD-10-CM

## 2019-08-21 MED ORDER — IOHEXOL 350 MG/ML SOLN
100.0000 mL | Freq: Once | INTRAVENOUS | Status: AC | PRN
  Administered 2019-08-21: 100 mL via INTRAVENOUS

## 2019-08-21 NOTE — Therapy (Signed)
[place], [place], [address] Phone: [phone]   Fax:  [phone]  Physical Therapy Evaluation  Patient Details  Name: Steve Durham MRN: [id number] Date of Birth: [birth year] Referring Provider (PT): Lauree Chandler MD   Encounter Date: 08/21/2019  PT End of Session - 08/21/19 1330    Visit Number  1    Number of Visits  1    Date for PT Re-Evaluation  08/21/19    PT Start Time  1233    PT Stop Time  1300    PT Time Calculation (min)  27 min    Activity Tolerance  Patient tolerated treatment well    Behavior During Therapy  [facility] for tasks assessed/performed       Past Medical History:  Diagnosis Date  . Abnormal CT scan, kidney    07/2012 - multiple small cysts  . Aortic stenosis    a. Mod AS/AI by cath 07/2012.;  b.  Echo (07/31/13) is: EF 55-60%, mild aortic stenosis (mean gradient 13);  c. Echo (5/15):  EF 40-45%, mild AS (mean 12 mmHg), mod AI  . Back pain   . Carotid artery occlusion    a. Dopplers 07/2012: no significant high grade obstruction.  . Cholelithiasis    Seen on CT 07/2012  . Coronary artery disease    a. CABG 10/1991. b. cath 07/2012 with prog dsz, turned down for re-do CABG - for med rx for now.; c. NSTEMI/PCI: DES to mLAD ext into IMA graft;  d. Inf STEMI (5/15):  LM 60-70% then 99% before LAD, pLAD occl, pCFX stent 80+% ISR, oRCA 99% then occl (L-R collats to dRCA), L-LAD ok with patent stent, S-CFX occl (old), S-RCA occl (old); PCI: LM ext into CFX with Xience Alpine DES  . Heart failure (HCC) systolic  . Hyperlipidemia   . Hyperlipoproteinemia   . Hypertension   . Ischemic cardiomyopathy    a. 2D ECHO: 08/02/2014; EF 45%; severe hypokinesis base/mid-inferolat segments and severe hypokinesis base inferior segment. Mild LVH, mild AS (may be underestimated due to LV dysfunction).  Mean gradient (S): 14 mm Hg. Peak gradient (S): 25 mm Hg. VTI ratio of LVOT to aortic valve:  0.37. Mod LA dilation. Mild RV systolic dysfxn     Past Surgical History:  Procedure Laterality Date  . CARDIAC CATHETERIZATION  04/03/99  . CARDIAC CATHETERIZATION N/A 03/20/2015   Procedure: Left Heart Cath and Cors/Grafts Angiography;  Surgeon: Jettie Booze, MD;  Location: [facility];  Service: Cardiovascular;  Laterality: N/A;  . CARDIAC CATHETERIZATION N/A 04/28/2016   Procedure: Right/Left Heart Cath and Coronary/Graft Angiography;  Surgeon: Burnell Blanks, MD;  Location: [facility];  Service: Cardiovascular;  Laterality: N/A;  . CARPAL TUNNEL RELEASE  2007   Excision mass dorsal left wrist  . CORONARY ARTERY BYPASS GRAFT  1993  . FASCIOTOMY Right 07/30/2013   Procedure: OPEN FASCIOTOMY RIGHT RING FINGER & RIGHT SMALL FINGER MULTIPLE LEVELS;  Surgeon: Wynonia Sours, MD;  Location: [facility];  Service: Orthopedics;  Laterality: Right;  . HERNIA REPAIR  1988  . INGUINAL HERNIA REPAIR Left 06/07/2018   Procedure: LEFT INGUINAL HERNIA REPAIR;  Surgeon: [name], MD;  Location: [facility];  Service: General;  Laterality: Left;  . INSERTION OF MESH Left 06/07/2018   Procedure: INSERTION OF MESH;  Surgeon: [name], MD;  Location: [facility];  Service: General;  Laterality: Left;  . LEFT HEART CATHETERIZATION  WITH CORONARY ANGIOGRAM N/A 02/28/2014   Procedure: LEFT HEART CATHETERIZATION WITH CORONARY ANGIOGRAM;  Surgeon: Troy Sine, MD;  Location: [facility] CATH LAB;  Service: Cardiovascular;  Laterality: N/A;  . LEFT HEART CATHETERIZATION WITH CORONARY/GRAFT ANGIOGRAM N/A 08/10/2012   Procedure: LEFT HEART CATHETERIZATION WITH Beatrix Fetters;  Surgeon: Hillary Bow, MD;  Location: [facility] CATH LAB;  Service: Cardiovascular;  Laterality: N/A;  . LEFT HEART CATHETERIZATION WITH CORONARY/GRAFT ANGIOGRAM N/A 08/01/2014   Procedure: LEFT HEART CATHETERIZATION WITH Beatrix Fetters;  Surgeon: Leonie Man, MD;  Location: [facility] CATH LAB;  Service:  Cardiovascular;  Laterality: N/A;  . LEFT HEART CATHETERIZATION WITH CORONARY/GRAFT ANGIOGRAM N/A 02/12/2015   Procedure: LEFT HEART CATHETERIZATION WITH Beatrix Fetters;  Surgeon: Burnell Blanks, MD;  Location: [facility] C[facility] LAB;  Service: Cardiovascular;  Laterality: N/A;  . PERCUTANEOUS CORONARY STENT INTERVENTION (PCI-S)  07/31/2013   Procedure: PERCUTANEOUS CORONARY STENT INTERVENTION (PCI-S);  Surgeon: Burnell Blanks, MD;  Location: [facility] CATH LAB;  Service: Cardiovascular;;  LIMA to LAD at the anastomosis with aortic root shot   . RIGHT/LEFT HEART CATH AND CORONARY/GRAFT ANGIOGRAPHY N/A 04/04/2018   Procedure: RIGHT/LEFT HEART CATH AND CORONARY/GRAFT ANGIOGRAPHY;  Surgeon: Martinique, Peter M, MD;  Location: [facility];  Service: Cardiovascular;  Laterality: N/A;  . RIGHT/LEFT HEART CATH AND CORONARY/GRAFT ANGIOGRAPHY N/A 08/01/2019   Procedure: RIGHT/LEFT HEART CATH AND CORONARY/GRAFT ANGIOGRAPHY;  Surgeon: Burnell Blanks, MD;  Location: [facility];  Service: Cardiovascular;  Laterality: N/A;    There were no vitals filed for this visit.   Subjective Assessment - 08/21/19 1235    Subjective  The patient has had difficulty breathing over the past few year but it has become worse over the past year. He has shortness of breath walking short distances.    Pertinent History  significant history of cardiac disease; 5 bypasses    Limitations  Standing;Walking    Currently in Pain?  No/denies   just regular aches and pains for age        [facility] PT Assessment - 08/21/19 0001      Assessment   Medical Diagnosis  Severe Aortic Stenosis     Referring Provider (PT)  Lauree Chandler MD    Onset Date/Surgical Date  --   increased symptoms 1 year prior    Hand Dominance  Right    Next MD Visit  Next week     Prior Therapy  Nothing       Precautions   Precautions  None      Restrictions   Weight Bearing Restrictions  No      Balance Screen   Has  the patient fallen in the past 6 months  No    Has the patient had a decrease in activity level because of a fear of falling?   No    Is the patient reluctant to leave their home because of a fear of falling?   No      Home Environment   Additional Comments  nothing significant       Prior Function   Level of Independence  Independent    Vocation  Retired      Associate Professor   Overall Cognitive Status  Within Functional Limits for tasks assessed    Attention  Focused    Focused Attention  Appears intact    Memory  Appears intact    Awareness  Appears intact    Problem Solving  Appears intact  Sensation   Light Touch  Appears Intact    Additional Comments  denies parathesias       Coordination   Gross Motor Movements are Fluid and Coordinated  Yes    Fine Motor Movements are Fluid and Coordinated  Yes      Posture/Postural Control   Posture/Postural Control  No significant limitations      ROM / Strength   AROM / PROM / Strength  AROM;Strength;PROM      AROM   Overall AROM Comments  full UE/LE motion       PROM   Overall PROM Comments  Normal PROM       Strength   Overall Strength Comments  5/5 gross UE and LE     Strength Assessment Site  Hand    Right/Left hand  Right;Left    Right Hand Grip (lbs)  35   2 trigger fingers on right    Left Hand Grip (lbs)  40       [facility] Pre-Surgical Assessment - 08/21/19 0001    5 Meter Walk Test- trial 1  4 sec    5 Meter Walk Test- trial 2  4 sec.     5 Meter Walk Test- trial 3  5 sec.    5 meter walk test average  4.33 sec    4 Stage Balance Test tolerated for:   10 sec.    4 Stage Balance Test Position  4    comment  No lob     Sit To Stand Test- trial 1  5 sec.    Comment  12    ADL/IADL Independent with:  Bathing;Dressing;Meal prep    ADL/IADL Needs Assistance with:  Yard work    6 Minute Walk- Baseline  yes    BP (mmHg)  129/63    HR (bpm)  60    02 Sat (%RA)  97 %    Modified Borg Scale for Dyspnea  2- Mild  shortness of breath    Perceived Rate of Exertion (Borg)  6-    6 Minute Walk Post Test  yes    BP (mmHg)  141/71    HR (bpm)  84    02 Sat (%RA)  97 %    Modified Borg Scale for Dyspnea  4- somewhat severe    Perceived Rate of Exertion (Borg)  13- Somewhat hard    Aerobic Endurance Distance Walked  810    Endurance additional comments  53% distability;               Objective measurements completed on examination: See above findings.              PT Education - 08/21/19 1329    Education Details  purpose of testing    Person(s) Educated  Patient    Methods  Explanation;Demonstration;Tactile cues;Verbal cues    Comprehension  Verbalized understanding;Returned demonstration;Verbal cues required;Tactile cues required                  Plan - 08/21/19 1336    Clinical Impression Statement  See below    Stability/Clinical Decision Making  Stable/Uncomplicated    Clinical Decision Making  Low    Rehab Potential  Poor    PT Frequency  One time visit    Consulted and Agree with Plan of Care  Patient       Patient will benefit from skilled therapeutic intervention in order to improve the following deficits and impairments:  Visit Diagnosis: Other abnormalities of gait and mobility   Clinical Impression Statement: Pt is a 73 yo male presenting to OP PT for evaluation prior to possible TAVR surgery due to severe aortic stenosis. Pt reports onset of dyspnea with functional mobility approximately 12 months ago. Symptoms are limiting ability to walk short distances. Pt presents with normal ROM and strength, normal balance and is not at high fall risk 4 stage balance test, decreased walking speed and decrease aerobic endurance per 6 minute walk test. Pt ambulated 330 feet in 2 before requesting a seated rest beak lasting 1:40. At time of rest, patient's HR was 84 bpm and O2 was 97 on room air. Pt reported 4/10 shortness of breath on modified scale for dyspnea.  Pt able to resume after rest and ambulate an additional 480 feet. Pt ambulated a total of 810 feet in 6 minute walk. B/P increased significantly with 6 minute walk test. Based on the Short Physical Performance Battery, patient has a frailty rating of 11/12 with </= 5/12 considered frail.    Problem List Patient Active Problem List   Diagnosis Date Noted  . Severe aortic valve stenosis   . Diverticulitis 06/05/2018  . Inguinal hernia with incarceration 06/05/2018  . Dyspnea 04/04/2018  . Hypertension   . Hyperlipoproteinemia   . Heart failure ([place])   . Cholelithiasis   . Carotid artery occlusion   . Back pain   . Abnormal CT scan, kidney   . Other chest pain 05/15/2015  . Upper airway cough syndrome 03/22/2015  . Acute on chronic systolic heart failure, NYHA class 4 ([place])   . Acute renal insufficiency 03/20/2015  . COPD GOLD II/ still smoking  03/19/2015  . Chronic systolic CHF (congestive heart failure) ([place]) 08/04/2014  . Acute coronary syndrome ([place]) 08/01/2014  . Ischemic cardiomyopathy with EF 40-45% 03/02/2014  . Elevated INR 03/01/2014  . Aspiration pneumonia ([place]) 08/02/2013  . NSTEMI (non-ST elevated myocardial infarction) ([place]) 08/01/2013  . Community acquired pneumonia 08/01/2013  . Post/Lat STEMI - s/p DES to LM and PTCA of CFX in-stent restenosis this admission 07/31/2013  . Aortic stenosis, mild Oct 2015 07/31/2013  . Cigarette smoker 07/31/2013  . Habitual alcohol use 07/31/2013  . Aortic stenosis  08/11/2012  . Unstable angina ([place]) 08/11/2012  . Hyperlipidemia 08/11/2012  . Coronary artery disease 07/04/2011  . CAROTID ARTERY DISEASE 07/14/2009  . Essential hypertension 02/06/2009  . CABG '98, cath Oct 2014- LAD DES placed. 02/06/2009  . BACK PAIN 02/06/2009    Carney Living PT DPT  08/21/2019, 1:42 PM  [facility] [address] [place], [place], [address] Phone: [phone]   Fax:   [phone]  Name: Steve Durham MRN: [id number] Date of Birth: 28-Nov-1945

## 2019-08-21 NOTE — Progress Notes (Signed)
Carotid duplex       has been completed. Preliminary results can be found under CV proc through chart review. Tobiah Celestine, BS, RDMS, RVT   

## 2019-08-28 ENCOUNTER — Other Ambulatory Visit: Payer: Self-pay

## 2019-08-28 DIAGNOSIS — I350 Nonrheumatic aortic (valve) stenosis: Secondary | ICD-10-CM

## 2019-08-29 ENCOUNTER — Institutional Professional Consult (permissible substitution) (INDEPENDENT_AMBULATORY_CARE_PROVIDER_SITE_OTHER): Payer: Medicare Other | Admitting: Surgery

## 2019-08-29 ENCOUNTER — Encounter: Payer: Medicare Other | Admitting: Surgery

## 2019-08-29 ENCOUNTER — Other Ambulatory Visit: Payer: Self-pay

## 2019-08-29 ENCOUNTER — Encounter: Payer: Self-pay | Admitting: Surgery

## 2019-08-29 VITALS — BP 163/72 | HR 61 | Temp 96.4°F | Resp 16 | Ht 65.0 in | Wt 144.0 lb

## 2019-08-29 DIAGNOSIS — I350 Nonrheumatic aortic (valve) stenosis: Secondary | ICD-10-CM

## 2019-08-29 NOTE — Progress Notes (Signed)
Patient ID: Steve Durham, male   DOB: 1945-11-15, 73 y.o.   MRN: [id number]  [facility] CONSULTATION REPORT  Referring Provider is Burnell Blanks* Primary Cardiologist is Lauree Chandler, MD PCP is Nona Dell, Corene Cornea, MD  Chief Complaint  Patient presents with   Aortic Stenosis    TAVR eval    HPI:  The patient is a 73 year old gentleman with history of hypertension, hyperlipidemia, coronary artery disease status post CABG x5 by Dr. Servando Snare in 1993 with subsequent PCI/stenting of the mid LAD extending back into the IMA graft in 2014 and stenting of the proximal left circumflex back into the left main in 2015.  He was readmitted in October 2015 with unstable angina and catheterization showed stenosis within the left main stent and OM 1.  A drug-eluting stent was placed in the first OM and the left main stent restenosis was treated with balloon angioplasty.  He had repeat catheterizations in April 2016 and June 2016 with stable disease.  He has continued smoking with severe COPD.  He has aortic stenosis that has been followed by Dr. Angelena Form.  An echo in June 2019 showed a left ventricular ejection fraction of 50 to 55% with moderate aortic stenosis with a mean gradient of 20 mmHg.  There was also moderate aortic insufficiency.  His most recent echocardiogram on 07/05/2019 showed a drop in his left ventricular ejection fraction to 30 to 35%.  The mean gradient across aortic valve was 24 mmHg with a peak gradient of 44.6 mmHg.  There was moderate to severe aortic insufficiency.  He reports a several month history of progressive exertional fatigue and shortness of breath as well as some chest tightness.  He said that this is different than his previous anginal symptoms.  He denies any dizziness or syncope.  He has had no orthopnea or PND.  He denies peripheral edema.  He was seen by Dr. Lake Bells of pulmonary medicine  in October 2019 and felt to have severe COPD.  He says that he has had shortness of breath for several years but it is definitely progressed over the past few months.  The patient is here today by himself.  He lives with his wife in [place].  He is retired but spends a lot of his time outside working in the yard and mowing his grass.  He continues to smoke about 1/2 pack of cigarettes per day.  Past Medical History:  Diagnosis Date   Abnormal CT scan, kidney    07/2012 - multiple small cysts   Aortic stenosis    a. Mod AS/AI by cath 07/2012.;  b.  Echo (07/31/13) is: EF 55-60%, mild aortic stenosis (mean gradient 13);  c. Echo (5/15):  EF 40-45%, mild AS (mean 12 mmHg), mod AI   Back pain    Carotid artery occlusion    a. Dopplers 07/2012: no significant high grade obstruction.   Cholelithiasis    Seen on CT 07/2012   Coronary artery disease    a. CABG 10/1991. b. cath 07/2012 with prog dsz, turned down for re-do CABG - for med rx for now.; c. NSTEMI/PCI: DES to mLAD ext into IMA graft;  d. Inf STEMI (5/15):  LM 60-70% then 99% before LAD, pLAD occl, pCFX stent 80+% ISR, oRCA 99% then occl (L-R collats to dRCA), L-LAD ok with patent stent, S-CFX occl (old), S-RCA occl (old); PCI: LM ext into CFX with Xience Alpine DES  Heart failure ([place]) systolic   Hyperlipidemia    Hyperlipoproteinemia    Hypertension    Ischemic cardiomyopathy    a. 2D ECHO: 08/02/2014; EF 45%; severe hypokinesis base/mid-inferolat segments and severe hypokinesis base inferior segment. Mild LVH, mild AS (may be underestimated due to LV dysfunction).  Mean gradient (S): 14 mm Hg. Peak gradient (S): 25 mm Hg. VTI ratio of LVOT to aortic valve: 0.37. Mod LA dilation. Mild RV systolic dysfxn     Past Surgical History:  Procedure Laterality Date   CARDIAC CATHETERIZATION  04/03/99   CARDIAC CATHETERIZATION N/A 03/20/2015   Procedure: Left Heart Cath and Cors/Grafts Angiography;  Surgeon: Jettie Booze, MD;   Location: [facility];  Service: Cardiovascular;  Laterality: N/A;   CARDIAC CATHETERIZATION N/A 04/28/2016   Procedure: Right/Left Heart Cath and Coronary/Graft Angiography;  Surgeon: Burnell Blanks, MD;  Location: [facility];  Service: Cardiovascular;  Laterality: N/A;   CARPAL TUNNEL RELEASE  2007   Excision mass dorsal left wrist   CORONARY ARTERY BYPASS GRAFT  1993   FASCIOTOMY Right 07/30/2013   Procedure: OPEN FASCIOTOMY RIGHT RING FINGER & RIGHT SMALL FINGER MULTIPLE LEVELS;  Surgeon: Wynonia Sours, MD;  Location: [facility];  Service: Orthopedics;  Laterality: Right;   HERNIA REPAIR  1988   INGUINAL HERNIA REPAIR Left 06/07/2018   Procedure: LEFT INGUINAL HERNIA REPAIR;  Surgeon: [name], MD;  Location: [facility];  Service: General;  Laterality: Left;   INSERTION OF MESH Left 06/07/2018   Procedure: INSERTION OF MESH;  Surgeon: [name], MD;  Location: [place] [facility];  Service: General;  Laterality: Left;   LEFT HEART CATHETERIZATION WITH CORONARY ANGIOGRAM N/A 02/28/2014   Procedure: LEFT HEART CATHETERIZATION WITH CORONARY ANGIOGRAM;  Surgeon: Troy Sine, MD;  Location: [facility] CATH LAB;  Service: Cardiovascular;  Laterality: N/A;   LEFT HEART CATHETERIZATION WITH CORONARY/GRAFT ANGIOGRAM N/A 08/10/2012   Procedure: LEFT HEART CATHETERIZATION WITH Beatrix Fetters;  Surgeon: Hillary Bow, MD;  Location: [facility] CATH LAB;  Service: Cardiovascular;  Laterality: N/A;   LEFT HEART CATHETERIZATION WITH CORONARY/GRAFT ANGIOGRAM N/A 08/01/2014   Procedure: LEFT HEART CATHETERIZATION WITH Beatrix Fetters;  Surgeon: Leonie Man, MD;  Location: [facility] LAB;  Service: Cardiovascular;  Laterality: N/A;   LEFT HEART CATHETERIZATION WITH CORONARY/GRAFT ANGIOGRAM N/A 02/12/2015   Procedure: LEFT HEART CATHETERIZATION WITH Beatrix Fetters;  Surgeon: Burnell Blanks, MD;  Location: [facility] CATH LAB;  Service: Cardiovascular;   Laterality: N/A;   PERCUTANEOUS CORONARY STENT INTERVENTION (PCI-S)  07/31/2013   Procedure: PERCUTANEOUS CORONARY STENT INTERVENTION (PCI-S);  Surgeon: Burnell Blanks, MD;  Location: [facility] CATH LAB;  Service: Cardiovascular;;  LIMA to LAD at the anastomosis with aortic root shot    RIGHT/LEFT HEART CATH AND CORONARY/GRAFT ANGIOGRAPHY N/A 04/04/2018   Procedure: RIGHT/LEFT HEART CATH AND CORONARY/GRAFT ANGIOGRAPHY;  Surgeon: Martinique, Peter M, MD;  Location: [place] [facility];  Service: Cardiovascular;  Laterality: N/A;   RIGHT/LEFT HEART CATH AND CORONARY/GRAFT ANGIOGRAPHY N/A 08/01/2019   Procedure: RIGHT/LEFT HEART CATH AND CORONARY/GRAFT ANGIOGRAPHY;  Surgeon: Burnell Blanks, MD;  Location: [place] [facility];  Service: Cardiovascular;  Laterality: N/A;    Family History  Problem Relation Age of Onset   Heart attack Mother        died @ 52.   Hypertension Mother    Hypertension Father    Hypertension Sister    Emphysema Brother    Hypertension Brother    Allergies  Sister    Stroke Neg Hx     Social History   Socioeconomic History   Marital status: Married    Spouse name: Not on file   Number of children: 1   Years of education: Not on file   Highest education level: Not on file  Occupational History   Occupation: Plumbing    Comment: Retired in 2007  [place] resource strain: Not on file   Food insecurity    Worry: Not on file    Inability: Not on Lexicographer needs    Medical: Not on file    Non-medical: Not on file  Tobacco Use   Smoking status: Current Every Day Smoker    Packs/day: 1.50    Years: 57.00    Pack years: 85.50    Types: Cigarettes   Smokeless tobacco: Never Used  Substance and Sexual Activity   Alcohol use: Yes    Alcohol/week: 5.0 standard drinks    Types: 5 Cans of beer per week    Comment: daily-6 daily   Drug use: No   Sexual activity: Not on file  Lifestyle   Physical  activity    Days per week: Not on file    Minutes per session: Not on file   Stress: Not on file  Relationships   Social connections    Talks on phone: Not on file    Gets together: Not on file    Attends religious service: Not on file    Active member of club or organization: Not on file    Attends meetings of clubs or organizations: Not on file    Relationship status: Not on file   Intimate partner violence    Fear of current or ex partner: Not on file    Emotionally abused: Not on file    Physically abused: Not on file    Forced sexual activity: Not on file  Other Topics Concern   Not on file  Social History Narrative   Lives in [place] with his family.  Does not routinely exercise.    Current Outpatient Medications  Medication Sig Dispense Refill   amLODipine (NORVASC) 5 MG tablet TAKE ONE (1) TABLET BY MOUTH EVERY DAY (Patient taking differently: Take 5 mg by mouth daily. ) 90 tablet 1   atorvastatin (LIPITOR) 80 MG tablet TAKE 1 TABLET BY MOUTH ONCE DAILY. (Patient taking differently: Take 80 mg by mouth at bedtime. ) 90 tablet 3   bisoprolol (ZEBETA) 5 MG tablet TAKE 1/2 TABLET BY MOUTH TWICE DAILY. (Patient taking differently: Take 2.5 mg by mouth 2 (two) times daily. ) 90 tablet 1   budesonide (PULMICORT) 0.25 MG/2ML nebulizer solution Take 2 mLs (0.25 mg total) by nebulization 2 (two) times daily. Dx: J44.9 120 mL 11   clopidogrel (PLAVIX) 75 MG tablet TAKE 1 TABLET BY MOUTH DAILY (Patient taking differently: Take 75 mg by mouth daily. ) 90 tablet 1   fexofenadine (ALLEGRA) 180 MG tablet Take 180 mg by mouth daily.      formoterol (PERFOROMIST) 20 MCG/2ML nebulizer solution Take 2 mLs (20 mcg total) by nebulization 2 (two) times daily. 120 mL 5   furosemide (LASIX) 40 MG tablet TAKE ONE TABLET BY MOUTH TWICE DAILY (Patient taking differently: Take 40 mg by mouth 2 (two) times daily. ) 180 tablet 1   isosorbide mononitrate (IMDUR) 60 MG 24 hr tablet TAKE ONE  TABLET BY MOUTH TWICE DAILY (Patient taking differently: Take 60 mg  by mouth 2 (two) times daily. ) 180 tablet 3   nitroGLYCERIN (NITROSTAT) 0.4 MG SL tablet TAKE 1 TABLET UNDER THE TONGUE AS NEEDEDFOR CHEST PAIN ( MAY REPEAT EVERY 5 MINUTES X 3) (Patient taking differently: Place 0.4 mg under the tongue every 5 (five) minutes as needed for chest pain. ) 25 tablet 3   pantoprazole (PROTONIX) 40 MG tablet TAKE 1 TABLET BY MOUTH ONCE DAILY 90 tablet 3   potassium chloride SA (K-DUR) 20 MEQ tablet TAKE 1 TABLET BY MOUTH DAILY (Patient taking differently: Take 20 mEq by mouth daily. ) 90 tablet 1   spironolactone (ALDACTONE) 25 MG tablet TAKE 1/2 TABLET BY MOUTH TWICE DAILY (Patient taking differently: Take 12.5 mg by mouth 2 (two) times daily. ) 90 tablet 1   No current facility-administered medications for this visit.     No Known Allergies    Review of Systems:   General:  normal appetite, + decreased energy, no weight gain, no weight loss, no fever  Cardiac:  no chest pain with exertion, no chest pain at rest, +SOB with mild exertion, no resting SOB, no PND, no orthopnea, no palpitations, no arrhythmia, no atrial fibrillation, no LE edema, no dizzy spells, no syncope  Respiratory:  + shortness of breath, no home oxygen, no productive cough, no dry cough, no bronchitis, no wheezing, no hemoptysis, no asthma, no pain with inspiration or cough, no sleep apnea, no CPAP at night  GI:   no difficulty swallowing, no reflux, no frequent heartburn, no hiatal hernia, no abdominal pain, no constipation, no diarrhea, no hematochezia, no hematemesis, no melena  GU:   no dysuria,  no frequency, no urinary tract infection, no hematuria, no enlarged prostate, no kidney stones, no kidney disease  Vascular:  no pain suggestive of claudication, no pain in feet, no leg cramps, no varicose veins, no DVT, no non-healing foot ulcer  Neuro:   no stroke, no TIA's, no seizures, no headaches, no temporary blindness  one eye,  no slurred speech, no peripheral neuropathy, no chronic pain, no instability of gait, no memory/cognitive dysfunction  Musculoskeletal: no arthritis, no joint swelling, no myalgias, no difficulty walking, normal mobility   Skin:   no rash, no itching, no skin infections, no pressure sores or ulcerations  Psych:   no anxiety, no depression, no nervousness, no unusual recent stress  Eyes:   no blurry vision, no floaters, no recent vision changes, + wears glasses or contacts  ENT:   + hearing loss, no loose or painful teeth, no dentures, last saw dentist several years ago  Hematologic:  no easy bruising, no abnormal bleeding, no clotting disorder, no frequent epistaxis  Endocrine:  no diabetes, does not check CBG's at home    Physical Exam:   BP (!) 163/72 (BP Location: Right Arm, Patient Position: Sitting, Cuff Size: Normal)    Pulse 61    Temp (!) 96.4 F (35.8 C)    Resp 16    Ht 5\' 5"  (1.651 m)    Wt 144 lb (65.3 kg)    SpO2 97% Comment: RA   BMI 23.96 kg/m   General:  Elderly but well-appearing  HEENT:  Unremarkable, [place]/AT, PERLA, EOMI, oropharynx clear  Neck:   no JVD, no bruits, no adenopathy   Chest:   clear to auscultation, symmetrical breath sounds, no wheezes, no rhonchi   CV:   RRR, grade III/VI crescendo/decrescendo murmur heard best at RSB,  no diastolic murmur  Abdomen:  soft, non-tender, no  masses   Extremities:  warm, well-perfused, pulses not palpable in feet, no LE edema  Rectal/GU  Deferred  Neuro:   Grossly non-focal and symmetrical throughout  Skin:   Clean and dry, no rashes, no breakdown   Diagnostic Tests:   Patient Name:   Steve Durham Date of Exam: 07/05/2019 Medical Rec #:  [id number]      Height:       65.0 in Accession #:    [id number]     Weight:       143.0 lb Date of Birth:  07-16-1946       BSA:          1.72 m Patient Age:    24 years       BP:           146/70 mmHg Patient Gender: M              HR:           57 bpm. Exam Location:   [place]  Procedure: 2D Echo, Cardiac Doppler and Color Doppler  Indications:    I35.0 Nonrheumatic aortic (valve) stenosis   History:        Patient has prior history of Echocardiogram examinations, most                 recent 03/24/2018. CHF, CAD, Prior CABG; COPD Risk                 Factors:Hypertension and Dyslipidemia. Back pain.   Sonographer:    Diamond Nickel RCS Referring Phys: [place]    1. Left ventricular ejection fraction, by visual estimation, is 30 to 35%. The left ventricle has moderate to severely decreased function. Left ventricular septal wall thickness was normal. Normal left ventricular posterior wall thickness. There is no  left ventricular hypertrophy.  2. Left ventricular diastolic Doppler parameters are consistent with pseudonormalization pattern of LV diastolic filling.  3. GLS = -13.2%.       4. Global right ventricle has normal systolic function.The right ventricular size is normal. No increase in right ventricular wall thickness.  5. Left atrial size was moderately dilated.  6. Right atrial size was normal.  7. The mitral valve is normal in structure. Mild mitral valve regurgitation.  8. The tricuspid valve is normal in structure. Tricuspid valve regurgitation was not visualized by color flow Doppler.  9. The aortic valve The aortic valve has an indeterminant number of cusps Aortic valve regurgitation is moderate to severe by color flow Doppler. Moderate aortic valve stenosis. 10. The pulmonic valve was normal in structure. Pulmonic valve regurgitation is trivial by color flow Doppler. 11. The atrial septum is grossly normal.  FINDINGS  Left Ventricle: Left ventricular ejection fraction, by visual estimation, is 30 to 35%. The left ventricle has moderate to severely decreased function. Left ventricular septal wall thickness was normal. Normal left ventricular posterior wall thickness.  There is no left ventricular  hypertrophy. Spectral Doppler shows Left ventricular diastolic Doppler parameters are consistent with pseudonormalization pattern of LV diastolic filling. GLS = -13.2%.     Right Ventricle: The right ventricular size is normal. No increase in right ventricular wall thickness. Global RV systolic function is has normal systolic function.  Left Atrium: Left atrial size was moderately dilated.  Right Atrium: Right atrial size was normal in size  Pericardium: There is no evidence of pericardial effusion.  Mitral Valve: The mitral valve is normal in structure.  Mild mitral valve regurgitation.  Tricuspid Valve: The tricuspid valve is normal in structure. Tricuspid valve regurgitation was not visualized by color flow Doppler.  Aortic Valve: The aortic valve The aortic valve has an indeterminant number of cusps. Aortic valve regurgitation is moderate to severe by color flow Doppler. Aortic regurgitation PHT measures 318 msec. Moderate aortic stenosis is present. Aortic valve  mean gradient measures 24.0 mmHg. Aortic valve peak gradient measures 44.6 mmHg. Aortic valve area, by VTI measures 1.12 cm.  Pulmonic Valve: The pulmonic valve was normal in structure. Pulmonic valve regurgitation is trivial by color flow Doppler.  Aorta: The aortic root is normal in size and structure.  Shunts: The atrial septum is grossly normal.    LEFT VENTRICLE          Normals PLAX 2D LVIDd:         5.60 cm  3.6 cm   Diastology                 Normals LVIDs:         4.80 cm  1.7 cm   LV e' lateral:   6.53 cm/s 6.42 cm/s LV PW:         0.90 cm  1.4 cm   LV E/e' lateral: 14.7      15.4 LV IVS:        1.10 cm  1.3 cm   LV e' medial:    4.79 cm/s 6.96 cm/s LVOT diam:     2.00 cm  2.0 cm   LV E/e' medial:  20.0      6.96 LV SV:         46 ml    79 ml LV SV Index:   26.64    45 ml/m2 LVOT Area:     3.14 cm 3.14 cm2    RIGHT VENTRICLE RV Basal diam:  2.49 cm RV S prime:     8.81 cm/s TAPSE  (M-mode): 1.3 cm  LEFT ATRIUM             Index       RIGHT ATRIUM           Index LA diam:        5.00 cm 2.91 cm/m  RA Area:     13.30 cm LA Vol (A2C):   85.7 ml 49.96 ml/m RA Volume:   30.30 ml  17.66 ml/m LA Vol (A4C):   64.0 ml 37.31 ml/m LA Biplane Vol: 74.1 ml 43.20 ml/m  AORTIC VALVE                    Normals AV Area (Vmax):    1.24 cm AV Area (Vmean):   1.08 cm     3.06 cm2 AV Area (VTI):     1.12 cm AV Vmax:           334.00 cm/s AV Vmean:          228.667 cm/s 77 cm/s AV VTI:            0.800 m      3.15 cm2 AV Peak Grad:      44.6 mmHg AV Mean Grad:      24.0 mmHg    3 mmHg LVOT Vmax:         132.00 cm/s LVOT Vmean:        78.700 cm/s  75 cm/s LVOT VTI:          0.284 m  25.3 cm LVOT/AV VTI ratio: 0.36         1 AI PHT:            318 msec   AORTA                 Normals Ao Root diam: 2.40 cm 31 mm  MITRAL VALVE              Normals MV Area (PHT): 4.06 cm             SHUNTS MV PHT:        54.23 msec 55 ms     Systemic VTI:  0.28 m MV Decel Time: 187 msec   187 ms    Systemic Diam: 2.00 cm MV E velocity: 96.00 cm/s 103 cm/s MV A velocity: 60.00 cm/s 70.3 cm/s MV E/A ratio:  1.60       1.5    Mertie Moores MD Electronically signed by Mertie Moores MD Signature Date/Time: 07/05/2019/3:35:27 PM    Physicians  Panel Physicians Referring Physician Case Authorizing Physician  Burnell Blanks, MD (Primary)    Procedures  RIGHT/LEFT HEART CATH AND CORONARY/GRAFT ANGIOGRAPHY  Conclusion    Non-stenotic Ost LM to LM lesion was previously treated.  Ost LAD to Prox LAD lesion is 100% stenosed.  Non-stenotic Dist LAD lesion was previously treated.  Non-stenotic Ost 1st Mrg lesion was previously treated.  Ost Cx to Prox Cx lesion is 50% stenosed.  Ost RCA to Prox RCA lesion is 100% stenosed.  Ost LM to Dist LM lesion is 40% stenosed.  SVG graft was not visualized.  Origin to Prox Graft lesion is 100% stenosed.  SVG graft was  not visualized.  Origin to Prox Graft lesion is 100% stenosed.   1. Severe triple vessel CAD s/p 3V CABG. The only patent graft is the LIMA to the LAD.  2. Chronic occlusion ostial RCA (not injected). The distal RCA/PDA fills from left to right collaterals from the LAD and from the Circumflex.  3. Patent left main stent into the proximal Circumflex and first obtuse marginal branch. There is moderate restenosis in this stented segment but it does not appear to be flow limiting and is unchanged from last cath in 2019.  4. Chronic occlusion proximal LAD. The mid and distal LAD fills from the patent LIMA graft. The stent from the LAD into the LIMA graft is patent without restenosis.  5. Moderate to severe aortic stenosis by echo. By cath the gradients across the valve are low (mean gradient 15 mmHg). He is felt to have lower gradients due to his LV systolic dysfunction. Also with moderately severe AI by echo and fall in LV systolic function.   Recommendation: Will continue workup for TAVR    Recommendations  Antiplatelet/Anticoag Continue workup for TAVR  Indications  Severe aortic stenosis [I35.0 (ICD-10-CM)]  Procedural Details  Technical Details Indication: 73 yo male with history of CAD s/p CABG with 1 known patent bypass graft (LIMA to LAD) now with severe AS. Workup for TAVR  Procedure: The risks, benefits, complications, treatment options, and expected outcomes were discussed with the patient. The patient and/or family concurred with the proposed plan, giving informed consent. The patient was brought to the cath lab after IV hydration was given. The patient was sedated with Versed and Fentanyl. The right groin was prepped and draped in the usual manner. Using the modified Seldinger access technique, a 5 French sheath was placed in the right femoral artery  and a 7 French sheath was placed in the right femoral vein using u/s guidance. Right heart catheterization performed with a balloon  tipped catheter. Standard diagnostic catheters were used to perform selective coronary angiography. I engaged the left main with a JL4 and the LIMA graft with a JR4. I did not engage the native RCA or either vein graft as they are known to be occluded. The aortic valve was crossed with an AL-2 and a straight wire. There were no immediate complications. The patient was taken to the recovery area in stable condition.   Estimated blood loss <50 mL.   During this procedure medications were administered to achieve and maintain moderate conscious sedation while the patient's heart rate, blood pressure, and oxygen saturation were continuously monitored and I was present face-to-face 100% of this time.  Medications (Filter: Administrations occurring from 08/01/19 1105 to 08/01/19 1207) (important)  Continuous medications are totaled by the amount administered until 08/01/19 1207.  Medication Rate/Dose/Volume Action  Date Time   fentaNYL (SUBLIMAZE) injection (mcg) 25 mcg Given 08/01/19 1117   Total dose as of 08/01/19 1207 25 mcg Given 1137   50 mcg        midazolam (VERSED) injection (mg) 1 mg Given 08/01/19 1117   Total dose as of 08/01/19 1207 1 mg Given 1138   2 mg        lidocaine (PF) (XYLOCAINE) 1 % injection (mL) 15 mL Given 08/01/19 1129   Total dose as of 08/01/19 1207        15 mL        iohexol (OMNIPAQUE) 350 MG/ML injection (mL) 45 mL Given 08/01/19 1202   Total dose as of 08/01/19 1207        45 mL        Sedation Time  Sedation Time Physician-1: 44 minutes 37 seconds  Contrast  Medication Name Total Dose  iohexol (OMNIPAQUE) 350 MG/ML injection 45 mL    Radiation/Fluoro  Fluoro time: 8.1 (min) DAP: 13759 (mGycm2) Cumulative Air Kerma: 123456 (mGy)  Complications  Complications documented before study signed (08/01/2019 12:47 PM)   RIGHT/LEFT HEART CATH AND CORONARY/GRAFT ANGIOGRAPHY  None Documented by Burnell Blanks, MD 08/01/2019 12:05 PM  Date Found:  08/01/2019  Time Range: Intraprocedure      Coronary Findings  Diagnostic Dominance: Co-dominant Left Main  Ost LM to LM lesion 0% stenosed  Non-stenotic Ost LM to LM lesion was previously treated.  Ost LM to Dist LM lesion 40% stenosed  Ost LM to Dist LM lesion is 40% stenosed. The lesion was previously treated using a drug eluting stent over 2 years ago.  Left Anterior Descending  Ost LAD to Prox LAD lesion 100% stenosed  Ost LAD to Prox LAD lesion is 100% stenosed.  Dist LAD lesion 0% stenosed  Non-stenotic Dist LAD lesion was previously treated.  Left Circumflex  Ost Cx to Prox Cx lesion 50% stenosed  Ost Cx to Prox Cx lesion is 50% stenosed. The lesion was previously treated using a drug eluting stent between 1-2 years ago.  First Obtuse Marginal Branch  Ost 1st Mrg lesion 0% stenosed  Non-stenotic Ost 1st Mrg lesion was previously treated.  Right Coronary Artery  Ost RCA to Prox RCA lesion 100% stenosed  Ost RCA to Prox RCA lesion is 100% stenosed.  Right Posterior Descending Artery  Collaterals  RPDA filled by collaterals from Dist LAD.    LIMA Graft to Dist LAD  saphenous Graft to Dist RCA  SVG graft was not visualized.  Origin to Prox Graft lesion 100% stenosed  Origin to Prox Graft lesion is 100% stenosed. The lesion is chronically occluded.  saphenous Graft to 3rd Mrg  SVG graft was not visualized.  Origin to Prox Graft lesion 100% stenosed  Origin to Prox Graft lesion is 100% stenosed. The lesion is chronically occluded.  Intervention  No interventions have been documented. Coronary Diagrams  Diagnostic Dominance: Co-dominant  Intervention  Implants   No implant documentation for this case.  Syngo Images  Show images for CARDIAC CATHETERIZATION  Images on Long Term Storage  Show images for Segar, JONCARLO TUCCIARONE to Procedure Log  Procedure Log    Hemo Data   Most Recent Value  Fick Cardiac Output 3.57 L/min  Fick Cardiac Output Index  2.08 (L/min)/BSA  Aortic Mean Gradient 15 mmHg  Aortic Peak Gradient 9 mmHg  Aortic Valve Area 0.97  Aortic Value Area Index 0.57 cm2/BSA  RA A Wave 7 mmHg  RA V Wave 7 mmHg  RA Mean 4 mmHg  RV Systolic Pressure 41 mmHg  RV Diastolic Pressure 0 mmHg  RV EDP 9 mmHg  PA Systolic Pressure 41 mmHg  PA Diastolic Pressure 16 mmHg  PA Mean 26 mmHg  PW A Wave 18 mmHg  PW V Wave 19 mmHg  PW Mean 13 mmHg  AO Systolic Pressure 0000000 mmHg  AO Diastolic Pressure 52 mmHg  AO Mean 80 mmHg  LV Systolic Pressure [id number] mmHg  LV Diastolic Pressure 10 mmHg  LV EDP 22 mmHg  AOp Systolic Pressure A999333 mmHg  AOp Diastolic Pressure 55 mmHg  AOp Mean Pressure 85 mmHg  LVp Systolic Pressure [id number] mmHg  LVp Diastolic Pressure 12 mmHg  LVp EDP Pressure 26 mmHg  QP/QS 1  TPVR Index 12.5 HRUI  TSVR Index 38.47 HRUI  PVR SVR Ratio 0.17  TPVR/TSVR Ratio 0.32    ADDENDUM REPORT: 08/21/2019 22:54  CLINICAL DATA:  73 year old male with severe aortic stenosis being evaluated for a TAVR procedure.  EXAM: Cardiac TAVR CT  TECHNIQUE: The patient was scanned on a [employer]. A 120 kV retrospective scan was triggered in the descending thoracic aorta at 111 HU's. Gantry rotation speed was 250 msecs and collimation was .6 mm. No beta blockade or nitro were given. The 3D data set was reconstructed in 5% intervals of the R-R cycle. Systolic and diastolic phases were analyzed on a dedicated work station using MPR, MIP and VRT modes. The patient received 80 cc of contrast.  FINDINGS: Aortic Valve: Trileaflet aortic valve with severely thickened and calcified leaflets, severely restricted leaflets opening and no calcifications extending into LVOT.  Aorta: Normal size with moderate to severe atherosclerotic plaque and calcifications and no dissection.  Sinotubular Junction: 31 x 29 mm  Ascending Thoracic Aorta: 36 x 33 mm  Aortic Arch: 28 x 28 mm  Descending Thoracic Aorta: 24 x 24  mm  Sinus of Valsalva Measurements:  Non-coronary: 32 mm  Right -coronary: 31 mm  Left -coronary: 31 mm  Coronary Artery Height above Annulus:  Left Main: 16 mm  Right Coronary: 19 mm  Virtual Basal Annulus Measurements:  Maximum/Minimum Diameter: 28.1 x 23.4 mm  Mean Diameter: 24.3 mm  Perimeter: 78.1 mm  Area: 463 mm2  Optimum Fluoroscopic Angle for Delivery: LAO 0 CAU 0  IMPRESSION: 1. Trileaflet aortic valve with severely thickened and calcified leaflets, severely restricted leaflets opening and no calcifications extending into LVOT. Aortic valve calcium score  1936 (> 2065 severe for men). Annular measurements suitable for delivery of a 26 mm Edwards-SAPIEN 3 Ultra THV.  2. Sufficient coronary to annulus distance.  3. Optimum Fluoroscopic Angle for Delivery: LAO 0 CAU 0.  4. No thrombus in the left atrial appendage.   Electronically Signed   By: Ena Dawley   On: 08/21/2019 22:54   CLINICAL DATA:  Severe symptomatic aortic stenosis. Pre-TAVR evaluation.  EXAM: CT ANGIOGRAPHY CHEST, ABDOMEN AND PELVIS  TECHNIQUE: Multidetector CT imaging through the chest, abdomen and pelvis was performed using the standard protocol during bolus administration of intravenous contrast. Multiplanar reconstructed images and MIPs were obtained and reviewed to evaluate the vascular anatomy.  CONTRAST:  119mL OMNIPAQUE IOHEXOL 350 MG/ML SOLN  COMPARISON:  02/02/2017 chest CT angiogram. 06/06/2018 CT abdomen/pelvis.  FINDINGS: CTA CHEST FINDINGS  Cardiovascular: Mild cardiomegaly. Diffuse thickening and coarse calcification of the aortic valve. No significant pericardial effusion/thickening. Left main and 3 vessel coronary atherosclerosis status post CABG. Atherosclerotic nonaneurysmal thoracic aorta, including severe atherosclerotic calcification of the ascending thoracic aorta. Normal caliber pulmonary arteries. No central pulmonary  emboli.  Mediastinum/Nodes: No discrete thyroid nodules. Unremarkable esophagus. No pathologically enlarged axillary, mediastinal or hilar lymph nodes.  Lungs/Pleura: No pneumothorax. No pleural effusion. Mild centrilobular and paraseptal emphysema with diffuse bronchial wall thickening. No acute consolidative airspace disease or lung masses. No significant pulmonary nodules.  Musculoskeletal: No aggressive appearing focal osseous lesions. Intact sternotomy wires. Mild thoracic spondylosis.  CTA ABDOMEN AND PELVIS FINDINGS  Hepatobiliary: Normal liver size. No liver masses. Stable granulomatous inferior right liver calcification. Cholelithiasis. No biliary ductal dilatation.  Pancreas: Normal, with no mass or duct dilation.  Spleen: Normal size. No mass.  Adrenals/Urinary Tract: Stable diffuse bilateral adrenal thickening without discrete adrenal nodules, suggesting adrenal hyperplasia. No hydronephrosis. Several indeterminate renal cortical lesions in both kidneys, largest in the right kidney measuring 2.8 cm in the anterior upper right kidney with density 49 HU (series 14/image 371), increased from 2.4 cm on 06/05/2018 CT, and largest in the left kidney measuring 1.7 cm in the interpolar left kidney with density 83 HU (series 14/image 381), increased from 1.4 cm. Several subcentimeter hypodense renal cortical lesions in both kidneys are too small to characterize. Stable septated 5.7 cm Bosniak category 2 renal cyst in the lateral upper right kidney. Stable chronic mild diffuse bladder wall thickening without significant bladder distention.  Stomach/Bowel: Small hiatal hernia. Otherwise normal nondistended stomach normal caliber small bowel with no small bowel wall thickening. Normal appendix. Marked sigmoid diverticulosis, with no large bowel wall thickening or significant pericolonic fat stranding.  Vascular/Lymphatic: Atherosclerotic abdominal aorta with  stable 3.2 cm infrarenal abdominal aortic aneurysm. Stable aneurysmal bilateral common iliac arteries measuring 2.8 cm diameter on the left and 1.9 cm diameter on the right. No pathologically enlarged lymph nodes in the abdomen or pelvis.  Reproductive: Mild prostatomegaly.  Other: No pneumoperitoneum, ascites or focal fluid collection.  Musculoskeletal: No aggressive appearing focal osseous lesions. Moderate lumbar spondylosis.  VASCULAR MEASUREMENTS PERTINENT TO TAVR:  AORTA:  Minimal Aortic Diameter-12.4 x 11.2 mm  Severity of Aortic Calcification-severe  RIGHT PELVIS:  Right Common Iliac Artery -  Minimal Diameter-5.6 x 5.5 mm  Tortuosity-mild  Calcification-severe  Right External Iliac Artery -  Minimal Diameter-4.3 x 4.3 mm  Tortuosity-mild-to-moderate  Calcification-moderate to severe  Right Common Femoral Artery -  Minimal Diameter-4.4 x 4.2 mm  Tortuosity-mild  Calcification-severe  LEFT PELVIS:  Left Common Iliac Artery -  Minimal Diameter-9.7 x 9.1 mm  Tortuosity-mild  Calcification-severe  Left External Iliac Artery -  Minimal Diameter-3.8 x 2.8 mm  Tortuosity-mild-to-moderate  Calcification-severe  Left Common Femoral Artery -  Minimal Diameter-6.0 x 4.1 mm  Tortuosity-mild  Calcification-severe  Review of the MIP images confirms the above findings.  IMPRESSION: 1. Vascular findings and measurements pertinent to potential TAVR procedure, as detailed. Note is made of diminutive external iliac arteries bilaterally and diffuse severe atherosclerotic calcification including along the ascending thoracic aorta. 2. Marked thickening and calcification of the aortic valve, compatible with the reported history of symptomatic severe aortic stenosis. 3. Mild cardiomegaly. 4. Several indeterminate renal cortical lesions in both kidneys, largest 2.8 cm in the anterior upper right kidney, mildly  increased in size. MRI (preferred) or CT abdomen without and with IV contrast recommended for further characterization, to exclude renal cell carcinoma. 5. Infrarenal 3.2 cm Abdominal Aortic Aneurysm (ICD10-I71.9). Recommend follow-up aortic ultrasound in 3 years. This recommendation follows ACR consensus guidelines: White Paper of the ACR Incidental Findings Committee II on Vascular Findings. J Am Coll Radiol 2013; 10:789-794. 6. Bilateral common iliac artery aneurysms, stable, 2.8 cm on the left and 1.9 cm on the right. 7. Chronic findings include: Aortic Atherosclerosis (ICD10-I70.0) and Emphysema (ICD10-J43.9). Cholelithiasis. Small hiatal hernia. Marked sigmoid diverticulosis. Mild prostatomegaly.   Electronically Signed   By: Ilona Sorrel M.D.   On: 08/21/2019 11:53  STS Risk score: AVR Risk of Mortality: 5.858% Renal Failure: 2.334% Permanent Stroke: 1.137% Prolonged Ventilation: 19.999% DSW Infection: 0.178% Reoperation: 5.310% Morbidity or Mortality: 23.237% Short Length of Stay: 24.687% Long Length of Stay: 10.460%  Impression:  This 73 year old gentleman has stage D, moderate to severe severe, low gradient, low ejection fraction symptomatic aortic stenosis as well as moderate to severe aortic insufficiency with New York Heart Association class III symptoms of exertional fatigue and shortness of breath.  He has had a reduction in his left ventricular ejection fraction to 30 to 35% and this is all consistent with chronic combined systolic and diastolic congestive heart failure.  I have personally reviewed his 2D echocardiogram, cardiac catheterization, and CTA studies.  His echocardiogram shows a a moderately calcified aortic valve with an indeterminate number of leaflets with restricted mobility.  The mean gradient across the aortic valve was measured at 24 mmHg which is increased from his prior echocardiogram.  He has moderate to severe aortic insufficiency  which is progressed from mild aortic insufficiency on his prior echocardiogram.  He has had progressive deterioration in his left ventricular systolic function with his ejection fraction decreasing from 50 to 55% in June 2019 to 30 to 35% now.  I agree that aortic valve replacement is indicated in this patient to improve his symptoms and prevent further left ventricular deterioration.  His cardiac catheterization shows severe three-vessel coronary disease with a patent left internal mammary graft to the LAD.  The right coronary artery is chronically occluded.  There is a patent stent in the left main into the proximal left circumflex and first obtuse marginal branch.  There is moderate restenosis in the stented segment but it does not appear to be flow-limiting and unchanged from his prior cath in 2019.  The mean gradient measured across the aortic valve was 15 mmHg with an elevated LVEDP of 22 mmHg.  I think TAVR would be the best option for this patient given his age, comorbid risk factors including prior coronary bypass surgery and extensive calcification of the ascending aorta.  His gated cardiac CTA shows anatomy suitable for transcatheter  aortic valve replacement using a Sapien 3 valve.  His abdominal and pelvic CTA shows diffuse aortoiliac and femoral vascular disease with small external iliac artery lumens bilaterally.  I do not think his pelvic vasculature is suitable for transfemoral insertion.  He does have adequate sized subclavian arteries bilaterally to allow insertion.  I think the left subclavian artery would give a more direct route into the ascending aorta since the innominate artery does take a fairly sharp turn after its origin.  He has adequate sized common carotid arteries bilaterally but does have some evidence of internal carotid artery stenosis on the CT scan as well as on his carotid Doppler examinations.  The patient was counseled at length regarding treatment alternatives for management  of severe symptomatic aortic stenosis. The risks and benefits of surgical intervention has been discussed in detail. Long-term prognosis with medical therapy was discussed. Alternative approaches such as conventional surgical aortic valve replacement, transcatheter aortic valve replacement, and palliative medical therapy were compared and contrasted at length. This discussion was placed in the context of the patient's own specific clinical presentation and past medical history. All of his questions have been addressed.   Following the decision to proceed with transcatheter aortic valve replacement, a discussion was held regarding what types of management strategies would be attempted intraoperatively in the event of life-threatening complications, including whether or not the patient would be considered a candidate for the use of cardiopulmonary bypass and/or conversion to open sternotomy for attempted surgical intervention.  Since he has had previous coronary artery bypass graft surgery and has a diffusely calcified ascending aorta I do not think he is a candidate for emergent sternotomy to manage any intraoperative complications.  The patient has been advised of a variety of complications that might develop including but not limited to risks of death, stroke, paravalvular leak, aortic dissection or other major vascular complications, aortic annulus rupture, device embolization, cardiac rupture or perforation, mitral regurgitation, acute myocardial infarction, arrhythmia, heart block or bradycardia requiring permanent pacemaker placement, congestive heart failure, respiratory failure, renal failure, pneumonia, infection, other late complications related to structural valve deterioration or migration, or other complications that might ultimately cause a temporary or permanent loss of functional independence or other long term morbidity. The patient provides full informed consent for the procedure as described and  all questions were answered.     Plan:  He will be scheduled for transcatheter aortic valve replacement using a Sapien 3 valve via the left subclavian artery approach on 09/04/2019.  I spent 60 minutes performing this consultation and > 50% of this time was spent face to face counseling and coordinating the care of this patient's severe symptomatic aortic stenosis and moderate to severe aortic insufficiency.    Gaye Pollack, MD 08/29/2019

## 2019-08-30 ENCOUNTER — Other Ambulatory Visit: Payer: Self-pay | Admitting: Physician Assistant

## 2019-08-30 DIAGNOSIS — Z952 Presence of prosthetic heart valve: Secondary | ICD-10-CM

## 2019-08-30 NOTE — Progress Notes (Signed)
[place], [place] [address] Phone: [phone] Fax: [phone]      Your procedure is scheduled on November 17  Report to [facility] Main Entrance "A" at 0830 A.M., and check in at the Admitting office.  Call this number if you have problems the morning of surgery:  [phone]  Call [phone] if you have any questions prior to your surgery date Monday-Friday 8am-4pm    Remember:  Do not eat or drink after midnight the night before your surgery    There are NO medications that you need to take the morning of surgery  Stop Plavix on 08/29/19  7 days prior to surgery STOP taking any Aspirin (unless otherwise instructed by your surgeon), Aleve, Naproxen, Ibuprofen, Motrin, Advil, Goody's, BC's, all herbal medications, fish oil, and all vitamins.    The Morning of Surgery  Do not wear jewelry  Do not wear lotions, powders, or colognes, or deodorant  Men may shave face and neck.  Do not bring valuables to the hospital.  [facility] is not responsible for any belongings or valuables.  If you are a smoker, DO NOT Smoke 24 hours prior to surgery IF you wear a CPAP at night please bring your mask, tubing, and machine the morning of surgery   Remember that you must have someone to transport you home after your surgery, and remain with you for 24 hours if you are discharged the same day.   Contacts, glasses, hearing aids, dentures or bridgework may not be worn into surgery.    Leave your suitcase in the car.  After surgery it may be brought to your room.  For patients admitted to the hospital, discharge time will be determined by your treatment team.  Patients discharged the day of surgery will not be allowed to drive home.    Special instructions:   [facility]- Preparing For Surgery  Before surgery, you can play an important role. Because skin is not sterile, your skin needs to be as free of germs as  possible. You can reduce the number of germs on your skin by washing with CHG (chlorahexidine gluconate) Soap before surgery.  CHG is an antiseptic cleaner which kills germs and bonds with the skin to continue killing germs even after washing.    Oral Hygiene is also important to reduce your risk of infection.  Remember - BRUSH YOUR TEETH THE MORNING OF SURGERY WITH YOUR REGULAR TOOTHPASTE  Please do not use if you have an allergy to CHG or antibacterial soaps. If your skin becomes reddened/irritated stop using the CHG.  Do not shave (including legs and underarms) for at least 48 hours prior to first CHG shower. It is OK to shave your face.  Please follow these instructions carefully.   1. Shower the NIGHT BEFORE SURGERY and the MORNING OF SURGERY with CHG Soap.   2. If you chose to wash your hair, wash your hair first as usual with your normal shampoo.  3. After you shampoo, rinse your hair and body thoroughly to remove the shampoo.  4. Use CHG as you would any other liquid soap. You can apply CHG directly to the skin and wash gently with a scrungie or a clean washcloth.   5. Apply the CHG Soap to your body ONLY FROM THE NECK DOWN.  Do not use on open wounds or open sores. Avoid contact with your eyes, ears, mouth and genitals (private parts). Wash  Face and genitals (private parts)  with your normal soap.   6. Wash thoroughly, paying special attention to the area where your surgery will be performed.  7. Thoroughly rinse your body with warm water from the neck down.  8. DO NOT shower/wash with your normal soap after using and rinsing off the CHG Soap.  9. Pat yourself dry with a CLEAN TOWEL.  10. Wear CLEAN PAJAMAS to bed the night before surgery, wear comfortable clothes the morning of surgery  11. Place CLEAN SHEETS on your bed the night of your first shower and DO NOT SLEEP WITH PETS.    Day of Surgery:  Do not apply any deodorants/lotions. Please shower the morning of surgery  with the CHG soap  Please wear clean clothes to the hospital/surgery center.   Remember to brush your teeth WITH YOUR REGULAR TOOTHPASTE.   Please read over the following fact sheets that you were given.

## 2019-08-31 ENCOUNTER — Ambulatory Visit (HOSPITAL_COMMUNITY)
Admission: RE | Admit: 2019-08-31 | Discharge: 2019-08-31 | Disposition: A | Discharge status: Home / Self Care | Payer: Medicare Other | Source: Ambulatory Visit | Attending: Cardiovascular Disease | Admitting: Cardiovascular Disease

## 2019-08-31 ENCOUNTER — Encounter (HOSPITAL_COMMUNITY): Payer: Self-pay

## 2019-08-31 ENCOUNTER — Encounter (HOSPITAL_COMMUNITY)
Admission: RE | Admit: 2019-08-31 | Discharge: 2019-08-31 | Disposition: A | Discharge status: Home / Self Care | Payer: Medicare Other | Source: Ambulatory Visit | Attending: Cardiovascular Disease | Admitting: Cardiovascular Disease

## 2019-08-31 ENCOUNTER — Other Ambulatory Visit (HOSPITAL_COMMUNITY)
Admission: RE | Admit: 2019-08-31 | Discharge: 2019-08-31 | Disposition: A | Discharge status: Home / Self Care | Payer: Medicare Other | Source: Ambulatory Visit | Attending: Cardiovascular Disease | Admitting: Cardiovascular Disease

## 2019-08-31 ENCOUNTER — Other Ambulatory Visit: Payer: Self-pay

## 2019-08-31 DIAGNOSIS — I2510 Atherosclerotic heart disease of native coronary artery without angina pectoris: Secondary | ICD-10-CM | POA: Diagnosis not present

## 2019-08-31 DIAGNOSIS — Z20828 Contact with and (suspected) exposure to other viral communicable diseases: Secondary | ICD-10-CM | POA: Insufficient documentation

## 2019-08-31 DIAGNOSIS — E785 Hyperlipidemia, unspecified: Secondary | ICD-10-CM | POA: Insufficient documentation

## 2019-08-31 DIAGNOSIS — F17210 Nicotine dependence, cigarettes, uncomplicated: Secondary | ICD-10-CM | POA: Diagnosis not present

## 2019-08-31 DIAGNOSIS — Z951 Presence of aortocoronary bypass graft: Secondary | ICD-10-CM | POA: Insufficient documentation

## 2019-08-31 DIAGNOSIS — I110 Hypertensive heart disease with heart failure: Secondary | ICD-10-CM | POA: Diagnosis not present

## 2019-08-31 DIAGNOSIS — I350 Nonrheumatic aortic (valve) stenosis: Secondary | ICD-10-CM | POA: Insufficient documentation

## 2019-08-31 DIAGNOSIS — R0602 Shortness of breath: Secondary | ICD-10-CM | POA: Diagnosis not present

## 2019-08-31 DIAGNOSIS — I5022 Chronic systolic (congestive) heart failure: Secondary | ICD-10-CM | POA: Insufficient documentation

## 2019-08-31 DIAGNOSIS — Z01818 Encounter for other preprocedural examination: Secondary | ICD-10-CM | POA: Insufficient documentation

## 2019-08-31 DIAGNOSIS — R05 Cough: Secondary | ICD-10-CM | POA: Diagnosis not present

## 2019-08-31 DIAGNOSIS — Z79899 Other long term (current) drug therapy: Secondary | ICD-10-CM | POA: Insufficient documentation

## 2019-08-31 HISTORY — DX: Dyspnea, unspecified: R06.00

## 2019-08-31 HISTORY — DX: Gastro-esophageal reflux disease without esophagitis: K21.9

## 2019-08-31 HISTORY — DX: Chronic obstructive pulmonary disease, unspecified: J44.9

## 2019-08-31 LAB — COMPREHENSIVE METABOLIC PANEL
ALT: 25 U/L (ref 0–44)
AST: 27 U/L (ref 15–41)
Albumin: 3.8 g/dL (ref 3.5–5.0)
Alkaline Phosphatase: 64 U/L (ref 38–126)
Anion gap: 16 — ABNORMAL HIGH (ref 5–15)
BUN: 17 mg/dL (ref 8–23)
CO2: 20 mmol/L — ABNORMAL LOW (ref 22–32)
Calcium: 9.2 mg/dL (ref 8.9–10.3)
Chloride: 104 mmol/L (ref 98–111)
Creatinine, Ser: 1.22 mg/dL (ref 0.61–1.24)
GFR calc Af Amer: >60 mL/min (ref >60)
GFR calc non Af Amer: 58 mL/min — ABNORMAL LOW (ref >60)
Glucose, Bld: 102 mg/dL — ABNORMAL HIGH (ref 70–99)
Potassium: 4.3 mmol/L (ref 3.5–5.1)
Sodium: 140 mmol/L (ref 135–145)
Total Bilirubin: 1.5 mg/dL — ABNORMAL HIGH (ref 0.3–1.2)
Total Protein: 7 g/dL (ref 6.5–8.1)

## 2019-08-31 LAB — URINALYSIS, ROUTINE W REFLEX MICROSCOPIC
Bilirubin Urine: NEGATIVE
Glucose, UA: NEGATIVE mg/dL
Hgb urine dipstick: NEGATIVE
Ketones, ur: NEGATIVE mg/dL
Leukocytes,Ua: NEGATIVE
Nitrite: NEGATIVE
Protein, ur: NEGATIVE mg/dL
Specific Gravity, Urine: 1.006 (ref 1.005–1.030)
pH: 5 (ref 5.0–8.0)

## 2019-08-31 LAB — BLOOD GAS, ARTERIAL
Acid-Base Excess: 0.6 mmol/L (ref 0.0–2.0)
Bicarbonate: 24.4 mmol/L (ref 20.0–28.0)
Drawn by: 421801
FIO2: 21
O2 Saturation: 97.6 %
Patient temperature: 37
pCO2 arterial: 37.7 mmHg (ref 32.0–48.0)
pH, Arterial: 7.427 (ref 7.350–7.450)
pO2, Arterial: 98.5 mmHg (ref 83.0–108.0)

## 2019-08-31 LAB — SURGICAL PCR SCREEN
MRSA, PCR: NEGATIVE
Staphylococcus aureus: NEGATIVE

## 2019-08-31 LAB — CBC
HCT: 46.5 % (ref 39.0–52.0)
Hemoglobin: 15.2 g/dL (ref 13.0–17.0)
MCH: 32.5 pg (ref 26.0–34.0)
MCHC: 32.7 g/dL (ref 30.0–36.0)
MCV: 99.6 fL (ref 80.0–100.0)
Platelets: 211 10*3/uL (ref 150–400)
RBC: 4.67 MIL/uL (ref 4.22–5.81)
RDW: 12.9 % (ref 11.5–15.5)
WBC: 10.7 10*3/uL — ABNORMAL HIGH (ref 4.0–10.5)
nRBC: 0 % (ref 0.0–0.2)

## 2019-08-31 LAB — APTT: aPTT: 28 seconds (ref 24–36)

## 2019-08-31 LAB — TYPE AND SCREEN
ABO/RH(D): O POS
Antibody Screen: NEGATIVE

## 2019-08-31 LAB — PROTIME-INR
INR: 1 (ref 0.8–1.2)
Prothrombin Time: 13.2 seconds (ref 11.4–15.2)

## 2019-08-31 LAB — ABO/RH: ABO/RH(D): O POS

## 2019-08-31 LAB — BRAIN NATRIURETIC PEPTIDE: B Natriuretic Peptide: 839.8 pg/mL — ABNORMAL HIGH (ref 0.0–100.0)

## 2019-08-31 LAB — HEMOGLOBIN A1C
Hgb A1c MFr Bld: 5.8 % — ABNORMAL HIGH (ref 4.8–5.6)
Mean Plasma Glucose: 119.76 mg/dL

## 2019-08-31 NOTE — Progress Notes (Signed)
Patient denies shortness of breath, fever, cough and chest pain.  PCP - Dr Barbie Haggis Cardiologist - Dr Julianne Handler Pulmonology - Rexene Edison, NP   Chest x-ray - 08/31/19 EKG - 08/31/19 Stress Test - denies ECHO - 07/05/19 Cardiac Cath - 08/01/19  Blood Thinner Instructions:  Follow your surgeon's instructions on when to stop prior to surgery.  Last dose Plavix was on 08/29/19.  Anesthesia review: Yes  Coronavirus Screening Have you experienced the following symptoms:  Cough yes/no: No Fever (>100.80F)  yes/no: No Runny nose yes/no: No Sore throat yes/no: No Difficulty breathing/shortness of breath  yes/no: No  Have you traveled in the last 14 days and where? yes/no: No  Patient verbalized understanding of instructions that were given to them at the PAT appointment.

## 2019-09-02 LAB — NOVEL CORONAVIRUS, NAA (HOSP ORDER, SEND-OUT TO REF LAB; TAT 18-24 HRS): SARS-CoV-2, NAA: NOT DETECTED

## 2019-09-03 MED ORDER — DEXMEDETOMIDINE HCL IN NACL 400 MCG/100ML IV SOLN
0.1000 ug/kg/h | INTRAVENOUS | Status: AC
  Administered 2019-09-04: 1 ug/kg/h via INTRAVENOUS
  Filled 2019-09-03: qty 100

## 2019-09-03 MED ORDER — SODIUM CHLORIDE 0.9 % IV SOLN
INTRAVENOUS | Status: DC
  Filled 2019-09-03: qty 30

## 2019-09-03 MED ORDER — SODIUM CHLORIDE 0.9 % IV SOLN
1.5000 g | INTRAVENOUS | Status: AC
  Administered 2019-09-04: 1.5 g via INTRAVENOUS
  Filled 2019-09-03: qty 1.5

## 2019-09-03 MED ORDER — NOREPINEPHRINE 4 MG/250ML-% IV SOLN
0.0000 ug/min | INTRAVENOUS | Status: AC
  Administered 2019-09-04: 2 ug/min via INTRAVENOUS
  Filled 2019-09-03: qty 250

## 2019-09-03 MED ORDER — VANCOMYCIN HCL 10 G IV SOLR
1250.0000 mg | INTRAVENOUS | Status: AC
  Administered 2019-09-04: 1250 mg via INTRAVENOUS
  Filled 2019-09-03: qty 1250

## 2019-09-03 MED ORDER — MAGNESIUM SULFATE 50 % IJ SOLN
40.0000 meq | INTRAMUSCULAR | Status: DC
  Filled 2019-09-03: qty 9.85

## 2019-09-03 MED ORDER — POTASSIUM CHLORIDE 2 MEQ/ML IV SOLN
80.0000 meq | INTRAVENOUS | Status: DC
  Filled 2019-09-03: qty 40

## 2019-09-03 NOTE — Progress Notes (Signed)
Anesthesia Chart Review:  Case: [id number] Date/Time: 09/04/19 1030   Procedures:      TRANSCATHETER AORTIC VALVE REPLACEMENT, LEFT SUBCLAVIAN (Left Chest)     TRANSESOPHAGEAL ECHOCARDIOGRAM (TEE) (N/A )   Anesthesia type: General   Pre-op diagnosis: Severe Aortic Stenosis   Location: [facility] OR ROOM 16 / [place] [facility]   Surgeon: Burnell Blanks, MD    CT surgeon: Gilford Raid, MD   DISCUSSION: Patient is a 73 year old male scheduled for the above procedure.   History includes smoking, CAD (s/p CABG 1993; occluded SVG-OM and SVG-RCA, medical therapy 08/10/12, DES-mLAD extending into LIMA graft 07/31/13; inferior STEMI 02/28/14, DES LM extending into CFX; 08/01/19; patent LIMA-LAD, known occluded SVG with dRCA/PDA filling from left-to-right collaterals from LAD/CX, patent LM stent with stable moderate restenosis, mid and distal LAD fill from patent LIMA, moderate-severe AS, referred for TAVR), severe AS, ischemic cardiomyopathy, CHF, COPD (GOLD II), exertional dyspnea, HTN, hyperlipoproteinemia, carotid artery stenosis, HLD, GERD.  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    08/31/19 COVID-19 test negative. Anesthesia team to evaluate on the day of surgery. Reported last Plavix 08/29/19.   VS: BP (!) 155/57   Pulse 63   Temp 36.8 C (Oral)   Resp 18   Ht 5\' 5"  (1.651 m)   Wt 64.2 kg   SpO2 95%   BMI 23.55 kg/m    PROVIDERS: Townsend Roger, MD is PCP Lauree Chandler, MD is cardiologist Simonne Maffucci, MD is pulmonologist   LABS: Labs reviewed: Acceptable for surgery. (all labs ordered are listed, but only abnormal results are displayed)  Labs Reviewed  BRAIN NATRIURETIC PEPTIDE - Abnormal; Notable for the following components:      Result Value   B Natriuretic Peptide 839.8 (*)    All other components within normal limits  CBC - Abnormal; Notable for the following  components:   WBC 10.7 (*)    All other components within normal limits  COMPREHENSIVE METABOLIC PANEL - Abnormal; Notable for the following components:   CO2 20 (*)    Glucose, Bld 102 (*)    Total Bilirubin 1.5 (*)    GFR calc non Af Amer 58 (*)    Anion gap 16 (*)    All other components within normal limits  HEMOGLOBIN A1C - Abnormal; Notable for the following components:   Hgb A1c MFr Bld 5.8 (*)    All other components within normal limits  URINALYSIS, ROUTINE W REFLEX MICROSCOPIC - Abnormal; Notable for the following components:   Color, Urine STRAW (*)    All other components within normal limits  SURGICAL PCR SCREEN  APTT  BLOOD GAS, ARTERIAL  PROTIME-INR  TYPE AND SCREEN  ABO/RH    Spirometry 07/27/19: FVC 2.2 (63%), FEV1 1.5 (60%), FEV1/FVC 69% (94%). Mild airway obstruction. Low vital capacity, perhaps due to restriction of lung volumes.   IMAGES: CXR 08/31/19: IMPRESSION: Hyperinflation without acute finding.  CTA chest/abd/pelvis 08/21/19: IMPRESSION: 1. Vascular findings and measurements pertinent to potential TAVR procedure, as detailed. Note is made of diminutive external iliac arteries bilaterally and diffuse severe atherosclerotic calcification including along the ascending thoracic aorta. 2. Marked thickening and calcification of the aortic valve, compatible with the reported history of symptomatic severe aortic stenosis. 3. Mild cardiomegaly. 4. Several indeterminate renal cortical lesions in both kidneys, largest 2.8 cm in the anterior upper right kidney,  mildly increased in size. MRI (preferred) or CT abdomen without and with IV contrast recommended for further characterization, to exclude renal cell carcinoma. 5. Infrarenal 3.2 cm Abdominal Aortic Aneurysm (ICD10-I71.9). Recommend follow-up aortic ultrasound in 3 years. This recommendation follows ACR consensus guidelines: White Paper of the ACR Incidental Findings Committee II on Vascular  Findings. J Am Coll Radiol 2013; 10:789-794. 6. Bilateral common iliac artery aneurysms, stable, 2.8 cm on the left and 1.9 cm on the right. 7. Chronic findings include: Aortic Atherosclerosis (ICD10-I70.0) and Emphysema (ICD10-J43.9). Cholelithiasis. Small hiatal hernia. Marked sigmoid diverticulosis. Mild prostatomegaly.    EKG: 08/31/19:  Normal sinus rhythm Non-specific intra-ventricular conduction delay No significant change since last tracing 06/06/18 Confirmed by Levin Erp [phone]) on 08/31/2019 2:48:24 PM   CV: CT coronary 08/21/19: IMPRESSION: 1. Trileaflet aortic valve with severely thickened and calcified leaflets, severely restricted leaflets opening and no calcifications extending into LVOT. Aortic valve calcium score 1936 (> 2065 severe for men). Annular measurements suitable for delivery of a 26 mm Edwards-SAPIEN 3 Ultra THV. 2. Sufficient coronary to annulus distance. 3. Optimum Fluoroscopic Angle for Delivery: LAO 0 CAU 0. 4. No thrombus in the left atrial appendage.   Carotid US 08/21/19: Summary: Right Carotid: Velocities in the right ICA are consistent with a 1-39% stenosis.                Abnormal waveforms due to severe aortic stenosis may obscure                velocities being in higher range. Based on plaque formation,                stenosis appears to be greater. Left Carotid: Velocities in the left ICA are consistent with a 40-59% stenosis.               Abnormal waveforms due to severe aortic stenosis may obscure               velocities being in higher range. Based on plaque formation,               stenosis appears to be greater.   Cardiac cath 08/01/19:  Non-stenotic Ost LM to LM lesion was previously treated.  Ost LAD to Prox LAD lesion is 100% stenosed.  Non-stenotic Dist LAD lesion was previously treated.  Non-stenotic Ost 1st Mrg lesion was previously treated.  Ost Cx to Prox Cx lesion is 50% stenosed.  Ost RCA to Prox RCA  lesion is 100% stenosed.  Ost LM to Dist LM lesion is 40% stenosed.  SVG graft was not visualized.  Origin to Prox Graft lesion is 100% stenosed.  SVG graft was not visualized.  Origin to Prox Graft lesion is 100% stenosed. 1. Severe triple vessel CAD s/p 3V CABG. The only patent graft is the LIMA to the LAD.  2. Chronic occlusion ostial RCA (not injected). The distal RCA/PDA fills from left to right collaterals from the LAD and from the Circumflex.  3. Patent left main stent into the proximal Circumflex and first obtuse marginal branch. There is moderate restenosis in this stented segment but it does not appear to be flow limiting and is unchanged from last cath in 2019.  4. Chronic occlusion proximal LAD. The mid and distal LAD fills from the patent LIMA graft. The stent from the LAD into the LIMA graft is patent without restenosis.  5. Moderate to severe aortic stenosis by echo. By cath the gradients across the valve are  low (mean gradient 15 mmHg). He is felt to have lower gradients due to his LV systolic dysfunction. Also with moderately severe AI by echo and fall in LV systolic function.  Recommendation: Will continue workup for TAVR    Echo 07/05/19: IMPRESSIONS  1. Left ventricular ejection fraction, by visual estimation, is 30 to 35%. The left ventricle has moderate to severely decreased function. Left ventricular septal wall thickness was normal. Normal left ventricular posterior wall thickness. There is no  left ventricular hypertrophy.  2. Left ventricular diastolic Doppler parameters are consistent with pseudonormalization pattern of LV diastolic filling.  3. GLS = -13.2%.  4. Global right ventricle has normal systolic function.The right ventricular size is normal. No increase in right ventricular wall thickness.  5. Left atrial size was moderately dilated.  6. Right atrial size was normal.  7. The mitral valve is normal in structure. Mild mitral valve regurgitation.  8. The  tricuspid valve is normal in structure. Tricuspid valve regurgitation was not visualized by color flow Doppler.  9. The aortic valve The aortic valve has an indeterminant number of cusps Aortic valve regurgitation is moderate to severe by color flow Doppler. Moderate aortic valve stenosis. 10. The pulmonic valve was normal in structure. Pulmonic valve regurgitation is trivial by color flow Doppler. 11. The atrial septum is grossly normal. AORTIC VALVE                    Normals AV Area (Vmax):    1.24 cm AV Area (Vmean):   1.08 cm     3.06 cm2 AV Area (VTI):     1.12 cm AV Vmax:           334.00 cm/s AV Vmean:          228.667 cm/s 77 cm/s AV VTI:            0.800 m      3.15 cm2 AV Peak Grad:      44.6 mmHg AV Mean Grad:      24.0 mmHg    3 mmHg LVOT Vmax:         132.00 cm/s LVOT Vmean:        78.700 cm/s  75 cm/s LVOT VTI:          0.284 m      25.3 cm LVOT/AV VTI ratio: 0.36         1   Past Medical History:  Diagnosis Date  . Abnormal CT scan, kidney    07/2012 - multiple small cysts  . Aortic stenosis    a. Mod AS/AI by cath 07/2012.;  b.  Echo (07/31/13) is: EF 55-60%, mild aortic stenosis (mean gradient 13);  c. Echo (5/15):  EF 40-45%, mild AS (mean 12 mmHg), mod AI  . Back pain    resolved per patient 08/31/19  . Carotid artery occlusion    a. Dopplers 07/2012: no significant high grade obstruction.  . Cholelithiasis    Seen on CT 07/2012  . COPD (chronic obstructive pulmonary disease) ([place])   . Coronary artery disease    a. CABG 10/1991. b. cath 07/2012 with prog dsz, turned down for re-do CABG - for med rx for now.; c. NSTEMI/PCI: DES to mLAD ext into IMA graft;  d. Inf STEMI (5/15):  LM 60-70% then 99% before LAD, pLAD occl, pCFX stent 80+% ISR, oRCA 99% then occl (L-R collats to dRCA), L-LAD ok with patent stent, S-CFX occl (old), S-RCA occl (old); PCI:  LM ext into CFX with Xience Alpine DES  . Dyspnea    with exertion  . GERD (gastroesophageal reflux disease)   .  Heart failure (HCC) systolic  . Hyperlipidemia   . Hyperlipoproteinemia   . Hypertension   . Ischemic cardiomyopathy    a. 2D ECHO: 08/02/2014; EF 45%; severe hypokinesis base/mid-inferolat segments and severe hypokinesis base inferior segment. Mild LVH, mild AS (may be underestimated due to LV dysfunction).  Mean gradient (S): 14 mm Hg. Peak gradient (S): 25 mm Hg. VTI ratio of LVOT to aortic valve: 0.37. Mod LA dilation. Mild RV systolic dysfxn     Past Surgical History:  Procedure Laterality Date  . CARDIAC CATHETERIZATION  04/03/99  . CARDIAC CATHETERIZATION N/A 03/20/2015   Procedure: Left Heart Cath and Cors/Grafts Angiography;  Surgeon: Jettie Booze, MD;  Location: [place] [facility];  Service: Cardiovascular;  Laterality: N/A;  . CARDIAC CATHETERIZATION N/A 04/28/2016   Procedure: Right/Left Heart Cath and Coronary/Graft Angiography;  Surgeon: Burnell Blanks, MD;  Location: [place] [facility];  Service: Cardiovascular;  Laterality: N/A;  . CARPAL TUNNEL RELEASE  2007   Excision mass dorsal left wrist  . CORONARY ARTERY BYPASS GRAFT  1993  . FASCIOTOMY Right 07/30/2013   Procedure: OPEN FASCIOTOMY RIGHT RING FINGER & RIGHT SMALL FINGER MULTIPLE LEVELS;  Surgeon: Wynonia Sours, MD;  Location: [place];  Service: Orthopedics;  Laterality: Right;  . HERNIA REPAIR  1988  . INGUINAL HERNIA REPAIR Left 06/07/2018   Procedure: LEFT INGUINAL HERNIA REPAIR;  Surgeon: [name], MD;  Location: [place];  Service: General;  Laterality: Left;  . INSERTION OF MESH Left 06/07/2018   Procedure: INSERTION OF MESH;  Surgeon: [name], MD;  Location: [place];  Service: General;  Laterality: Left;  . LEFT HEART CATHETERIZATION WITH CORONARY ANGIOGRAM N/A 02/28/2014   Procedure: LEFT HEART CATHETERIZATION WITH CORONARY ANGIOGRAM;  Surgeon: Troy Sine, MD;  Location: [facility] CATH LAB;  Service: Cardiovascular;  Laterality: N/A;  . LEFT HEART CATHETERIZATION WITH CORONARY/GRAFT  ANGIOGRAM N/A 08/10/2012   Procedure: LEFT HEART CATHETERIZATION WITH Beatrix Fetters;  Surgeon: Hillary Bow, MD;  Location: [facility] CATH LAB;  Service: Cardiovascular;  Laterality: N/A;  . LEFT HEART CATHETERIZATION WITH CORONARY/GRAFT ANGIOGRAM N/A 08/01/2014   Procedure: LEFT HEART CATHETERIZATION WITH Beatrix Fetters;  Surgeon: Leonie Man, MD;  Location: [facility] CATH LAB;  Service: Cardiovascular;  Laterality: N/A;  . LEFT HEART CATHETERIZATION WITH CORONARY/GRAFT ANGIOGRAM N/A 02/12/2015   Procedure: LEFT HEART CATHETERIZATION WITH Beatrix Fetters;  Surgeon: Burnell Blanks, MD;  Location: [facility] CATH LAB;  Service: Cardiovascular;  Laterality: N/A;  . PERCUTANEOUS CORONARY STENT INTERVENTION (PCI-S)  07/31/2013   Procedure: PERCUTANEOUS CORONARY STENT INTERVENTION (PCI-S);  Surgeon: Burnell Blanks, MD;  Location: [facility] CATH LAB;  Service: Cardiovascular;;  LIMA to LAD at the anastomosis with aortic root shot   . RIGHT/LEFT HEART CATH AND CORONARY/GRAFT ANGIOGRAPHY N/A 04/04/2018   Procedure: RIGHT/LEFT HEART CATH AND CORONARY/GRAFT ANGIOGRAPHY;  Surgeon: Martinique, Peter M, MD;  Location: [place] [facility] LAB;  Service: Cardiovascular;  Laterality: N/A;  . RIGHT/LEFT HEART CATH AND CORONARY/GRAFT ANGIOGRAPHY N/A 08/01/2019   Procedure: RIGHT/LEFT HEART CATH AND CORONARY/GRAFT ANGIOGRAPHY;  Surgeon: Burnell Blanks, MD;  Location: [place] [facility] LAB;  Service: Cardiovascular;  Laterality: N/A;    MEDICATIONS: . amLODipine (NORVASC) 5 MG tablet  . atorvastatin (LIPITOR) 80 MG tablet  . bisoprolol (ZEBETA) 5 MG tablet  . budesonide (PULMICORT) 0.25  MG/2ML nebulizer solution  . clopidogrel (PLAVIX) 75 MG tablet  . fexofenadine (ALLEGRA) 180 MG tablet  . formoterol (PERFOROMIST) 20 MCG/2ML nebulizer solution  . furosemide (LASIX) 40 MG tablet  . isosorbide mononitrate (IMDUR) 60 MG 24 hr tablet  . nitroGLYCERIN (NITROSTAT) 0.4 MG SL tablet  . pantoprazole  (PROTONIX) 40 MG tablet  . potassium chloride SA (K-DUR) 20 MEQ tablet  . spironolactone (ALDACTONE) 25 MG tablet   No current facility-administered medications for this encounter.    Derrill Memo ON 09/04/2019] cefUROXime (ZINACEF) 1.5 g in sodium chloride 0.9 % 100 mL IVPB  . [START ON 09/04/2019] dexmedetomidine (PRECEDEX) 400 MCG/100ML (4 mcg/mL) infusion  . [START ON 09/04/2019] heparin 30,000 units/NS 1000 mL solution for CELLSAVER  . [START ON 09/04/2019] magnesium sulfate (IV Push/IM) injection 40 mEq  . [START ON 09/04/2019] norepinephrine (LEVOPHED) 4mg  in 281mL premix infusion  . [START ON 09/04/2019] potassium chloride injection 80 mEq  . [START ON 09/04/2019] vancomycin (VANCOCIN) 1,250 mg in sodium chloride 0.9 % 250 mL IVPB     Myra Gianotti, PA-C Surgical Short Stay/Anesthesiology [facility] Phone [phone] [facility] Phone [phone] 09/03/2019 11:18 AM

## 2019-09-03 NOTE — H&P (Signed)
[address]       [place] [address]             [phone]      Cardiothoracic Surgery Admission History and Physical    Referring Provider is Burnell Blanks*  Primary Cardiologist is Lauree Chandler, MD  PCP is Nona Dell, Corene Cornea, MD      Chief Complaint  Patient presents with   Aortic Stenosis       HPI:  The patient is a 73 year old gentleman with history of hypertension, hyperlipidemia, coronary artery disease status post CABG x5 by Dr. Servando Snare in 1993 with subsequent PCI/stenting of the mid LAD extending back into the IMA graft in 2014 and stenting of the proximal left circumflex back into the left main in 2015. He was readmitted in October 2015 with unstable angina and catheterization showed stenosis within the left main stent and OM 1. A drug-eluting stent was placed in the first OM and the left main stent restenosis was treated with balloon angioplasty. He had repeat catheterizations in April 2016 and June 2016 with stable disease. He has continued smoking with severe COPD. He has aortic stenosis that has been followed by Dr. Angelena Form. An echo in June 2019 showed a left ventricular ejection fraction of 50 to 55% with moderate aortic stenosis with a mean gradient of 20 mmHg. There was also moderate aortic insufficiency. His most recent echocardiogram on 07/05/2019 showed a drop in his left ventricular ejection fraction to 30 to 35%. The mean gradient across aortic valve was 24 mmHg with a peak gradient of 44.6 mmHg. There was moderate to severe aortic insufficiency. He reports a several month history of progressive exertional fatigue and shortness of breath as well as some chest tightness. He said that this is different than his previous anginal symptoms. He denies any dizziness or syncope. He has had no orthopnea or PND. He denies peripheral edema. He was seen by Dr. Lake Bells of pulmonary medicine in October 2019 and felt to have severe COPD. He says  that he has had shortness of breath for several years but it is definitely progressed over the past few months.    He lives with his wife in [place]. He is retired but spends a lot of his time outside working in the yard and mowing his grass. He continues to smoke about 1/2 pack of cigarettes per day.      Past Medical History:  Diagnosis Date   Abnormal CT scan, kidney    07/2012 - multiple small cysts   Aortic stenosis    a. Mod AS/AI by cath 07/2012.; b. Echo (07/31/13) is: EF 55-60%, mild aortic stenosis (mean gradient 13); c. Echo (5/15): EF 40-45%, mild AS (mean 12 mmHg), mod AI   Back pain    Carotid artery occlusion    a. Dopplers 07/2012: no significant high grade obstruction.   Cholelithiasis    Seen on CT 07/2012   Coronary artery disease    a. CABG 10/1991. b. cath 07/2012 with prog dsz, turned down for re-do CABG - for med rx for now.; c. NSTEMI/PCI: DES to mLAD ext into IMA graft; d. Inf STEMI (5/15): LM 60-70% then 99% before LAD, pLAD occl, pCFX stent 80+% ISR, oRCA 99% then occl (L-R collats to dRCA), L-LAD ok with patent stent, S-CFX occl (old), S-RCA occl (old); PCI: LM ext into CFX with Xience Alpine DES   Heart failure ([place]) systolic   Hyperlipidemia  Hyperlipoproteinemia    Hypertension    Ischemic cardiomyopathy    a. 2D ECHO: 08/02/2014; EF 45%; severe hypokinesis base/mid-inferolat segments and severe hypokinesis base inferior segment. Mild LVH, mild AS (may be underestimated due to LV dysfunction). Mean gradient (S): 14 mm Hg. Peak gradient (S): 25 mm Hg. VTI ratio of LVOT to aortic valve: 0.37. Mod LA dilation. Mild RV systolic dysfxn         Past Surgical History:  Procedure Laterality Date   CARDIAC CATHETERIZATION  04/03/99   CARDIAC CATHETERIZATION N/A 03/20/2015   Procedure: Left Heart Cath and Cors/Grafts Angiography; Surgeon: Jettie Booze, MD; Location: [facility]; Service: Cardiovascular; Laterality: N/A;   CARDIAC  CATHETERIZATION N/A 04/28/2016   Procedure: Right/Left Heart Cath and Coronary/Graft Angiography; Surgeon: Burnell Blanks, MD; Location: [facility]; Service: Cardiovascular; Laterality: N/A;   CARPAL TUNNEL RELEASE  2007   Excision mass dorsal left wrist   CORONARY ARTERY BYPASS GRAFT  1993   FASCIOTOMY Right 07/30/2013   Procedure: OPEN FASCIOTOMY RIGHT RING FINGER & RIGHT SMALL FINGER MULTIPLE LEVELS; Surgeon: Wynonia Sours, MD; Location: [facility]; Service: Orthopedics; Laterality: Right;   HERNIA REPAIR  1988   INGUINAL HERNIA REPAIR Left 06/07/2018   Procedure: LEFT INGUINAL HERNIA REPAIR; Surgeon: [name], MD; Location: [facility]; Service: General; Laterality: Left;   INSERTION OF MESH Left 06/07/2018   Procedure: INSERTION OF MESH; Surgeon: [name], MD; Location: [facility]; Service: General; Laterality: Left;   LEFT HEART CATHETERIZATION WITH CORONARY ANGIOGRAM N/A 02/28/2014   Procedure: LEFT HEART CATHETERIZATION WITH CORONARY ANGIOGRAM; Surgeon: Troy Sine, MD; Location: [facility] C[facility] LAB; Service: Cardiovascular; Laterality: N/A;   LEFT HEART CATHETERIZATION WITH CORONARY/GRAFT ANGIOGRAM N/A 08/10/2012   Procedure: LEFT HEART CATHETERIZATION WITH Beatrix Fetters; Surgeon: Hillary Bow, MD; Location: [facility]; Service: Cardiovascular; Laterality: N/A;   LEFT HEART CATHETERIZATION WITH CORONARY/GRAFT ANGIOGRAM N/A 08/01/2014   Procedure: LEFT HEART CATHETERIZATION WITH Beatrix Fetters; Surgeon: Leonie Man, MD; Location: [facility] CATH LAB; Service: Cardiovascular; Laterality: N/A;   LEFT HEART CATHETERIZATION WITH CORONARY/GRAFT ANGIOGRAM N/A 02/12/2015   Procedure: LEFT HEART CATHETERIZATION WITH Beatrix Fetters; Surgeon: Burnell Blanks, MD; Location: [facility] CATH LAB; Service: Cardiovascular; Laterality: N/A;   PERCUTANEOUS CORONARY STENT INTERVENTION (PCI-S)  07/31/2013   Procedure: PERCUTANEOUS CORONARY STENT  INTERVENTION (PCI-S); Surgeon: Burnell Blanks, MD; Location: [facility] CATH LAB; Service: Cardiovascular;; LIMA to LAD at the anastomosis with aortic root shot    RIGHT/LEFT HEART CATH AND CORONARY/GRAFT ANGIOGRAPHY N/A 04/04/2018   Procedure: RIGHT/LEFT HEART CATH AND CORONARY/GRAFT ANGIOGRAPHY; Surgeon: Martinique, Peter M, MD; Location: [facility]; Service: Cardiovascular; Laterality: N/A;   RIGHT/LEFT HEART CATH AND CORONARY/GRAFT ANGIOGRAPHY N/A 08/01/2019   Procedure: RIGHT/LEFT HEART CATH AND CORONARY/GRAFT ANGIOGRAPHY; Surgeon: Burnell Blanks, MD; Location: [facility]; Service: Cardiovascular; Laterality: N/A;        Family History  Problem Relation Age of Onset   Heart attack Mother    died @ 20.   Hypertension Mother    Hypertension Father    Hypertension Sister    Emphysema Brother    Hypertension Brother    Allergies Sister    Stroke Neg Hx    Social History        Socioeconomic History   Marital status: Married    Spouse name: Not on file   Number of children: 1   Years of education: Not on file   Highest education level: Not on  file  Occupational History   Occupation: Plumbing    Comment: Retired in 2007  Scientist, product/process development strain: Not on file   Food insecurity    Worry: Not on file    Inability: Not on Lexicographer needs    Medical: Not on file    Non-medical: Not on file  Tobacco Use   Smoking status: Current Every Day Smoker    Packs/day: 1.50    Years: 57.00    Pack years: 85.50    Types: Cigarettes   Smokeless tobacco: Never Used  Substance and Sexual Activity   Alcohol use: Yes    Alcohol/week: 5.0 standard drinks    Types: 5 Cans of beer per week    Comment: daily-6 daily   Drug use: No   Sexual activity: Not on file  Lifestyle   Physical activity    Days per week: Not on file    Minutes per session: Not on file   Stress: Not on file  Relationships   Social  connections    Talks on phone: Not on file    Gets together: Not on file    Attends religious service: Not on file    Active member of club or organization: Not on file    Attends meetings of clubs or organizations: Not on file    Relationship status: Not on file   Intimate partner violence    Fear of current or ex partner: Not on file    Emotionally abused: Not on file    Physically abused: Not on file    Forced sexual activity: Not on file  Other Topics Concern   Not on file  Social History Narrative   Lives in [place] with his family. Does not routinely exercise.         Current Outpatient Medications  Medication Sig Dispense Refill   amLODipine (NORVASC) 5 MG tablet TAKE ONE (1) TABLET BY MOUTH EVERY DAY (Patient taking differently: Take 5 mg by mouth daily. ) 90 tablet 1   atorvastatin (LIPITOR) 80 MG tablet TAKE 1 TABLET BY MOUTH ONCE DAILY. (Patient taking differently: Take 80 mg by mouth at bedtime. ) 90 tablet 3   bisoprolol (ZEBETA) 5 MG tablet TAKE 1/2 TABLET BY MOUTH TWICE DAILY. (Patient taking differently: Take 2.5 mg by mouth 2 (two) times daily. ) 90 tablet 1   budesonide (PULMICORT) 0.25 MG/2ML nebulizer solution Take 2 mLs (0.25 mg total) by nebulization 2 (two) times daily. Dx: J44.9 120 mL 11   clopidogrel (PLAVIX) 75 MG tablet TAKE 1 TABLET BY MOUTH DAILY (Patient taking differently: Take 75 mg by mouth daily. ) 90 tablet 1   fexofenadine (ALLEGRA) 180 MG tablet Take 180 mg by mouth daily.      formoterol (PERFOROMIST) 20 MCG/2ML nebulizer solution Take 2 mLs (20 mcg total) by nebulization 2 (two) times daily. 120 mL 5   furosemide (LASIX) 40 MG tablet TAKE ONE TABLET BY MOUTH TWICE DAILY (Patient taking differently: Take 40 mg by mouth 2 (two) times daily. ) 180 tablet 1   isosorbide mononitrate (IMDUR) 60 MG 24 hr tablet TAKE ONE TABLET BY MOUTH TWICE DAILY (Patient taking differently: Take 60 mg by mouth 2 (two) times daily. ) 180 tablet 3    nitroGLYCERIN (NITROSTAT) 0.4 MG SL tablet TAKE 1 TABLET UNDER THE TONGUE AS NEEDEDFOR CHEST PAIN ( MAY REPEAT EVERY 5 MINUTES X 3) (Patient taking differently: Place 0.4 mg under the tongue every 5 (  five) minutes as needed for chest pain. ) 25 tablet 3   pantoprazole (PROTONIX) 40 MG tablet TAKE 1 TABLET BY MOUTH ONCE DAILY 90 tablet 3   potassium chloride SA (K-DUR) 20 MEQ tablet TAKE 1 TABLET BY MOUTH DAILY (Patient taking differently: Take 20 mEq by mouth daily. ) 90 tablet 1   spironolactone (ALDACTONE) 25 MG tablet TAKE 1/2 TABLET BY MOUTH TWICE DAILY (Patient taking differently: Take 12.5 mg by mouth 2 (two) times daily. ) 90 tablet 1   No current facility-administered medications for this visit.    No Known Allergies  Review of Systems:   General: normal appetite, + decreased energy, no weight gain, no weight loss, no fever  Cardiac: no chest pain with exertion, no chest pain at rest, +SOB with mild exertion, no resting SOB, no PND, no orthopnea, no palpitations, no arrhythmia, no atrial fibrillation, no LE edema, no dizzy spells, no syncope  Respiratory: + shortness of breath, no home oxygen, no productive cough, no dry cough, no bronchitis, no wheezing, no hemoptysis, no asthma, no pain with inspiration or cough, no sleep apnea, no CPAP at night  GI: no difficulty swallowing, no reflux, no frequent heartburn, no hiatal hernia, no abdominal pain, no constipation, no diarrhea, no hematochezia, no hematemesis, no melena  GU: no dysuria, no frequency, no urinary tract infection, no hematuria, no enlarged prostate, no kidney stones, no kidney disease  Vascular: no pain suggestive of claudication, no pain in feet, no leg cramps, no varicose veins, no DVT, no non-healing foot ulcer  Neuro: no stroke, no TIA's, no seizures, no headaches, no temporary blindness one eye, no slurred speech, no peripheral neuropathy, no chronic pain, no instability of gait, no memory/cognitive dysfunction    Musculoskeletal: no arthritis, no joint swelling, no myalgias, no difficulty walking, normal mobility  Skin: no rash, no itching, no skin infections, no pressure sores or ulcerations  Psych: no anxiety, no depression, no nervousness, no unusual recent stress  Eyes: no blurry vision, no floaters, no recent vision changes, + wears glasses or contacts  ENT: + hearing loss, no loose or painful teeth, no dentures, last saw dentist several years ago  Hematologic: no easy bruising, no abnormal bleeding, no clotting disorder, no frequent epistaxis  Endocrine: no diabetes, does not check CBG's at home  Physical Exam:  BP (!) 163/72 (BP Location: Right Arm, Patient Position: Sitting, Cuff Size: Normal)   Pulse 61   Temp (!) 96.4 F (35.8 C)   Resp 16   Ht 5\' 5"  (1.651 m)   Wt 144 lb (65.3 kg)   SpO2 97% Comment: RA   BMI 23.96 kg/m  General: Elderly but well-appearing  HEENT: Unremarkable, [place]/AT, PERLA, EOMI, oropharynx clear  Neck: no JVD, no bruits, no adenopathy  Chest: clear to auscultation, symmetrical breath sounds, no wheezes, no rhonchi  CV: RRR, grade III/VI crescendo/decrescendo murmur heard best at RSB, no diastolic murmur  Abdomen: soft, non-tender, no masses  Extremities: warm, well-perfused, pulses not palpable in feet, no LE edema  Rectal/GU Deferred  Neuro: Grossly non-focal and symmetrical throughout  Skin: Clean and dry, no rashes, no breakdown    Diagnostic Tests:   Patient Name: Steve Durham Date of Exam: 07/05/2019  Medical Rec #: [id number] Height: 65.0 in  Accession #: [id number] Weight: 143.0 lb  Date of Birth: [birth year] BSA: 1.72 m  Patient Age: 50 years BP: 146/70 mmHg  Patient Gender: M HR: 57 bpm.  Exam Location: [address]  Procedure: 2D  Echo, Cardiac Doppler and Color Doppler  Indications: I35.0 Nonrheumatic aortic (valve) stenosis  History: Patient has prior history of Echocardiogram examinations, most  recent 03/24/2018. CHF, CAD, Prior CABG; COPD Risk   Factors:Hypertension and Dyslipidemia. Back pain.  Sonographer: Diamond Nickel RCS  Referring Phys: [place]  1. Left ventricular ejection fraction, by visual estimation, is 30 to 35%. The left ventricle has moderate to severely decreased function. Left ventricular septal wall thickness was normal. Normal left ventricular posterior wall thickness. There is no  left ventricular hypertrophy.  2. Left ventricular diastolic Doppler parameters are consistent with pseudonormalization pattern of LV diastolic filling.  3. GLS = -13.2%.  4. Global right ventricle has normal systolic function.The right ventricular size is normal. No increase in right ventricular wall thickness.  5. Left atrial size was moderately dilated.  6. Right atrial size was normal.  7. The mitral valve is normal in structure. Mild mitral valve regurgitation.  8. The tricuspid valve is normal in structure. Tricuspid valve regurgitation was not visualized by color flow Doppler.  9. The aortic valve The aortic valve has an indeterminant number of cusps Aortic valve regurgitation is moderate to severe by color flow Doppler. Moderate aortic valve stenosis.  10. The pulmonic valve was normal in structure. Pulmonic valve regurgitation is trivial by color flow Doppler.  11. The atrial septum is grossly normal.  FINDINGS  Left Ventricle: Left ventricular ejection fraction, by visual estimation, is 30 to 35%. The left ventricle has moderate to severely decreased function. Left ventricular septal wall thickness was normal. Normal left ventricular posterior wall thickness.  There is no left ventricular hypertrophy. Spectral Doppler shows Left ventricular diastolic Doppler parameters are consistent with pseudonormalization pattern of LV diastolic filling. GLS = -13.2%.  Right Ventricle: The right ventricular size is normal. No increase in right ventricular wall thickness. Global RV systolic function is has normal  systolic function.  Left Atrium: Left atrial size was moderately dilated.  Right Atrium: Right atrial size was normal in size  Pericardium: There is no evidence of pericardial effusion.  Mitral Valve: The mitral valve is normal in structure. Mild mitral valve regurgitation.  Tricuspid Valve: The tricuspid valve is normal in structure. Tricuspid valve regurgitation was not visualized by color flow Doppler.  Aortic Valve: The aortic valve The aortic valve has an indeterminant number of cusps. Aortic valve regurgitation is moderate to severe by color flow Doppler. Aortic regurgitation PHT measures 318 msec. Moderate aortic stenosis is present. Aortic valve  mean gradient measures 24.0 mmHg. Aortic valve peak gradient measures 44.6 mmHg. Aortic valve area, by VTI measures 1.12 cm.  Pulmonic Valve: The pulmonic valve was normal in structure. Pulmonic valve regurgitation is trivial by color flow Doppler.  Aorta: The aortic root is normal in size and structure.  Shunts: The atrial septum is grossly normal.  LEFT VENTRICLE Normals  PLAX 2D  LVIDd: 5.60 cm 3.6 cm Diastology Normals  LVIDs: 4.80 cm 1.7 cm LV e' lateral: 6.53 cm/s 6.42 cm/s  LV PW: 0.90 cm 1.4 cm LV E/e' lateral: 14.7 15.4  LV IVS: 1.10 cm 1.3 cm LV e' medial: 4.79 cm/s 6.96 cm/s  LVOT diam: 2.00 cm 2.0 cm LV E/e' medial: 20.0 6.96  LV SV: 46 ml 79 ml  LV SV Index: 26.64 45 ml/m2  LVOT Area: 3.14 cm 3.14 cm2  RIGHT VENTRICLE  RV Basal diam: 2.49 cm  RV S prime: 8.81 cm/s  TAPSE (M-mode): 1.3 cm  LEFT ATRIUM  Index RIGHT ATRIUM Index  LA diam: 5.00 cm 2.91 cm/m RA Area: 13.30 cm  LA Vol (A2C): 85.7 ml 49.96 ml/m RA Volume: 30.30 ml 17.66 ml/m  LA Vol (A4C): 64.0 ml 37.31 ml/m  LA Biplane Vol: 74.1 ml 43.20 ml/m  AORTIC VALVE Normals  AV Area (Vmax): 1.24 cm  AV Area (Vmean): 1.08 cm 3.06 cm2  AV Area (VTI): 1.12 cm  AV Vmax: 334.00 cm/s  AV Vmean: 228.667 cm/s 77 cm/s  AV VTI: 0.800 m 3.15 cm2  AV Peak Grad: 44.6  mmHg  AV Mean Grad: 24.0 mmHg 3 mmHg  LVOT Vmax: 132.00 cm/s  LVOT Vmean: 78.700 cm/s 75 cm/s  LVOT VTI: 0.284 m 25.3 cm  LVOT/AV VTI ratio: 0.36 1  AI PHT: 318 msec  AORTA Normals  Ao Root diam: 2.40 cm 31 mm  MITRAL VALVE Normals  MV Area (PHT): 4.06 cm SHUNTS  MV PHT: 54.23 msec 55 ms Systemic VTI: 0.28 m  MV Decel Time: 187 msec 187 ms Systemic Diam: 2.00 cm  MV E velocity: 96.00 cm/s 103 cm/s  MV A velocity: 60.00 cm/s 70.3 cm/s  MV E/A ratio: 1.60 1.5  Mertie Moores MD  Electronically signed by Mertie Moores MD  Signature Date/Time: 07/05/2019/3:35:27 PM      Panel Physicians Referring Physician Case Authorizing Physician  Burnell Blanks, MD (Primary)    Procedures  RIGHT/LEFT HEART CATH AND CORONARY/GRAFT ANGIOGRAPHY  Conclusion  Non-stenotic Ost LM to LM lesion was previously treated.  Ost LAD to Prox LAD lesion is 100% stenosed.  Non-stenotic Dist LAD lesion was previously treated.  Non-stenotic Ost 1st Mrg lesion was previously treated.  Ost Cx to Prox Cx lesion is 50% stenosed.  Ost RCA to Prox RCA lesion is 100% stenosed.  Ost LM to Dist LM lesion is 40% stenosed.  SVG graft was not visualized.  Origin to Prox Graft lesion is 100% stenosed.  SVG graft was not visualized.  Origin to Prox Graft lesion is 100% stenosed. 1. Severe triple vessel CAD s/p 3V CABG. The only patent graft is the LIMA to the LAD.  2. Chronic occlusion ostial RCA (not injected). The distal RCA/PDA fills from left to right collaterals from the LAD and from the Circumflex.  3. Patent left main stent into the proximal Circumflex and first obtuse marginal branch. There is moderate restenosis in this stented segment but it does not appear to be flow limiting and is unchanged from last cath in 2019.  4. Chronic occlusion proximal LAD. The mid and distal LAD fills from the patent LIMA graft. The stent from the LAD into the LIMA graft is patent without restenosis.  5. Moderate to severe  aortic stenosis by echo. By cath the gradients across the valve are low (mean gradient 15 mmHg). He is felt to have lower gradients due to his LV systolic dysfunction. Also with moderately severe AI by echo and fall in LV systolic function.  Recommendation: Will continue workup for TAVR   Recommendations  Antiplatelet/Anticoag Continue workup for TAVR  Indications  Severe aortic stenosis [I35.0 (ICD-10-CM)]  Procedural Details  Technical Details Indication: 72 yo male with history of CAD s/p CABG with 1 known patent bypass graft (LIMA to LAD) now with severe AS. Workup for TAVR  Procedure: The risks, benefits, complications, treatment options, and expected outcomes were discussed with the patient. The patient and/or family concurred with the proposed plan, giving informed consent. The patient was brought to the cath lab after IV hydration  was given. The patient was sedated with Versed and Fentanyl. The right groin was prepped and draped in the usual manner. Using the modified Seldinger access technique, a 5 French sheath was placed in the right femoral artery and a 7 French sheath was placed in the right femoral vein using u/s guidance. Right heart catheterization performed with a balloon tipped catheter. Standard diagnostic catheters were used to perform selective coronary angiography. I engaged the left main with a JL4 and the LIMA graft with a JR4. I did not engage the native RCA or either vein graft as they are known to be occluded. The aortic valve was crossed with an AL-2 and a straight wire. There were no immediate complications. The patient was taken to the recovery area in stable condition.   Estimated blood loss <50 mL.   During this procedure medications were administered to achieve and maintain moderate conscious sedation while the patient's heart rate, blood pressure, and oxygen saturation were continuously monitored and I was present face-to-face 100% of this time.  Medications  (Filter:  Administrations occurring from 08/01/19 1105 to 08/01/19 1207)          (important) Continuous medications are totaled by the amount administered until 08/01/19 1207.  Medication Rate/Dose/Volume Action  Date Time   fentaNYL (SUBLIMAZE) injection (mcg) 25 mcg Given 08/01/19 1117   Total dose as of 08/01/19 1207 25 mcg Given 1137   50 mcg        midazolam (VERSED) injection (mg) 1 mg Given 08/01/19 1117   Total dose as of 08/01/19 1207 1 mg Given 1138   2 mg        lidocaine (PF) (XYLOCAINE) 1 % injection (mL) 15 mL Given 08/01/19 1129   Total dose as of 08/01/19 1207        15 mL        iohexol (OMNIPAQUE) 350 MG/ML injection (mL) 45 mL Given 08/01/19 1202   Total dose as of 08/01/19 1207        45 mL        Sedation Time  Sedation Time Physician-1: 44 minutes 37 seconds  Contrast  Medication Name Total Dose  iohexol (OMNIPAQUE) 350 MG/ML injection 45 mL  Radiation/Fluoro  Fluoro time: 8.1 (min)  DAP: 13759 (mGycm2)  Cumulative Air Kerma: 123456 (mGy)  Complications  Complications documented before study signed (08/01/2019 12:47 PM)   RIGHT/LEFT HEART CATH AND CORONARY/GRAFT ANGIOGRAPHY   None Documented by Burnell Blanks, MD 08/01/2019 12:05 PM  Date Found: 08/01/2019  Time Range: Intraprocedure    Coronary Findings  Diagnostic  Dominance: Co-dominant  Left Main  Ost LM to LM lesion 0% stenosed  Non-stenotic Ost LM to LM lesion was previously treated.  Ost LM to Dist LM lesion 40% stenosed  Ost LM to Dist LM lesion is 40% stenosed. The lesion was previously treated using a drug eluting stent over 2 years ago.  Left Anterior Descending  Ost LAD to Prox LAD lesion 100% stenosed  Ost LAD to Prox LAD lesion is 100% stenosed.  Dist LAD lesion 0% stenosed  Non-stenotic Dist LAD lesion was previously treated.  Left Circumflex  Ost Cx to Prox Cx lesion 50% stenosed  Ost Cx to Prox Cx lesion is 50% stenosed. The lesion was previously treated using a drug eluting stent  between 1-2 years ago.  First Obtuse Marginal Branch  Ost 1st Mrg lesion 0% stenosed  Non-stenotic Ost 1st Mrg lesion was previously treated.  Right Coronary Artery  Ost RCA to Prox RCA lesion 100% stenosed  Ost RCA to Prox RCA lesion is 100% stenosed.  Right Posterior Descending Artery  Collaterals  RPDA filled by collaterals from Dist LAD.    LIMA Graft to Dist LAD  saphenous Graft to Dist RCA  SVG graft was not visualized.  Origin to Prox Graft lesion 100% stenosed  Origin to Prox Graft lesion is 100% stenosed. The lesion is chronically occluded.  saphenous Graft to 3rd Mrg  SVG graft was not visualized.  Origin to Prox Graft lesion 100% stenosed  Origin to Prox Graft lesion is 100% stenosed. The lesion is chronically occluded.  Intervention  No interventions have been documented.  Coronary Diagrams  Diagnostic  Dominance: Co-dominant   Intervention  Implants     No implant documentation for this case.  Syngo Images  Link to Procedure Log   Show images for CARDIAC CATHETERIZATION Procedure Log  Images on Long [employer]    Show images for Lynk, Devam Ceravolo   Hemo Data   Most Recent Value  Fick Cardiac Output 3.57 L/min  Fick Cardiac Output Index 2.08 (L/min)/BSA  Aortic Mean Gradient 15 mmHg  Aortic Peak Gradient 9 mmHg  Aortic Valve Area 0.97  Aortic Value Area Index 0.57 cm2/BSA  RA A Wave 7 mmHg  RA V Wave 7 mmHg  RA Mean 4 mmHg  RV Systolic Pressure 41 mmHg  RV Diastolic Pressure 0 mmHg  RV EDP 9 mmHg  PA Systolic Pressure 41 mmHg  PA Diastolic Pressure 16 mmHg  PA Mean 26 mmHg  PW A Wave 18 mmHg  PW V Wave 19 mmHg  PW Mean 13 mmHg  AO Systolic Pressure 0000000 mmHg  AO Diastolic Pressure 52 mmHg  AO Mean 80 mmHg  LV Systolic Pressure [id number] mmHg  LV Diastolic Pressure 10 mmHg  LV EDP 22 mmHg  AOp Systolic Pressure [id number] mmHg  AOp Diastolic Pressure 55 mmHg  AOp Mean Pressure 85 mmHg  LVp Systolic Pressure [id number] mmHg  LVp Diastolic Pressure 12 mmHg  LVp  EDP Pressure 26 mmHg  QP/QS 1  TPVR Index 12.5 HRUI  TSVR Index 38.47 HRUI  PVR SVR Ratio 0.17  TPVR/TSVR Ratio 0.32    ADDENDUM REPORT: 08/21/2019 22:54  CLINICAL DATA: 73 year old male with severe aortic stenosis being  evaluated for a TAVR procedure.  EXAM:  Cardiac TAVR CT  TECHNIQUE:  The patient was scanned on a [employer]. A 120 kV  retrospective scan was triggered in the descending thoracic aorta at  111 HU's. Gantry rotation speed was 250 msecs and collimation was .6  mm. No beta blockade or nitro were given. The 3D data set was  reconstructed in 5% intervals of the R-R cycle. Systolic and  diastolic phases were analyzed on a dedicated work station using  MPR, MIP and VRT modes. The patient received 80 cc of contrast.  FINDINGS:  Aortic Valve: Trileaflet aortic valve with severely thickened and  calcified leaflets, severely restricted leaflets opening and no  calcifications extending into LVOT.  Aorta: Normal size with moderate to severe atherosclerotic plaque  and calcifications and no dissection.  Sinotubular Junction: 31 x 29 mm  Ascending Thoracic Aorta: 36 x 33 mm  Aortic Arch: 28 x 28 mm  Descending Thoracic Aorta: 24 x 24 mm  Sinus of Valsalva Measurements:  Non-coronary: 32 mm  Right -coronary: 31 mm  Left -coronary: 31 mm  Coronary Artery Height above Annulus:  Left Main: 16 mm  Right  Coronary: 19 mm  Virtual Basal Annulus Measurements:  Maximum/Minimum Diameter: 28.1 x 23.4 mm  Mean Diameter: 24.3 mm  Perimeter: 78.1 mm  Area: 463 mm2  Optimum Fluoroscopic Angle for Delivery: LAO 0 CAU 0  IMPRESSION:  1. Trileaflet aortic valve with severely thickened and calcified  leaflets, severely restricted leaflets opening and no calcifications  extending into LVOT. Aortic valve calcium score 1936 (> 2065 severe  for men). Annular measurements suitable for delivery of a 26 mm  Edwards-SAPIEN 3 Ultra THV.  2. Sufficient coronary to annulus  distance.  3. Optimum Fluoroscopic Angle for Delivery: LAO 0 CAU 0.  4. No thrombus in the left atrial appendage.  Electronically Signed  By: Ena Dawley  On: 08/21/2019 22:54  CLINICAL DATA: Severe symptomatic aortic stenosis. Pre-TAVR  evaluation.  EXAM:  CT ANGIOGRAPHY CHEST, ABDOMEN AND PELVIS  TECHNIQUE:  Multidetector CT imaging through the chest, abdomen and pelvis was  performed using the standard protocol during bolus administration of  intravenous contrast. Multiplanar reconstructed images and MIPs were  obtained and reviewed to evaluate the vascular anatomy.  CONTRAST: 181mL OMNIPAQUE IOHEXOL 350 MG/ML SOLN  COMPARISON: 02/02/2017 chest CT angiogram. 06/06/2018 CT  abdomen/pelvis.  FINDINGS:  CTA CHEST FINDINGS  Cardiovascular: Mild cardiomegaly. Diffuse thickening and coarse  calcification of the aortic valve. No significant pericardial  effusion/thickening. Left main and 3 vessel coronary atherosclerosis  status post CABG. Atherosclerotic nonaneurysmal thoracic aorta,  including severe atherosclerotic calcification of the ascending  thoracic aorta. Normal caliber pulmonary arteries. No central  pulmonary emboli.  Mediastinum/Nodes: No discrete thyroid nodules. Unremarkable  esophagus. No pathologically enlarged axillary, mediastinal or hilar  lymph nodes.  Lungs/Pleura: No pneumothorax. No pleural effusion. Mild  centrilobular and paraseptal emphysema with diffuse bronchial wall  thickening. No acute consolidative airspace disease or lung masses.  No significant pulmonary nodules.  Musculoskeletal: No aggressive appearing focal osseous lesions.  Intact sternotomy wires. Mild thoracic spondylosis.  CTA ABDOMEN AND PELVIS FINDINGS  Hepatobiliary: Normal liver size. No liver masses. Stable  granulomatous inferior right liver calcification. Cholelithiasis. No  biliary ductal dilatation.  Pancreas: Normal, with no mass or duct dilation.  Spleen: Normal size. No  mass.  Adrenals/Urinary Tract: Stable diffuse bilateral adrenal thickening  without discrete adrenal nodules, suggesting adrenal hyperplasia. No  hydronephrosis. Several indeterminate renal cortical lesions in both  kidneys, largest in the right kidney measuring 2.8 cm in the  anterior upper right kidney with density 49 HU (series 14/image  371), increased from 2.4 cm on 06/05/2018 CT, and largest in the  left kidney measuring 1.7 cm in the interpolar left kidney with  density 83 HU (series 14/image 381), increased from 1.4 cm. Several  subcentimeter hypodense renal cortical lesions in both kidneys are  too small to characterize. Stable septated 5.7 cm Bosniak category 2  renal cyst in the lateral upper right kidney. Stable chronic mild  diffuse bladder wall thickening without significant bladder  distention.  Stomach/Bowel: Small hiatal hernia. Otherwise normal nondistended  stomach normal caliber small bowel with no small bowel wall  thickening. Normal appendix. Marked sigmoid diverticulosis, with no  large bowel wall thickening or significant pericolonic fat  stranding.  Vascular/Lymphatic: Atherosclerotic abdominal aorta with stable 3.2  cm infrarenal abdominal aortic aneurysm. Stable aneurysmal bilateral  common iliac arteries measuring 2.8 cm diameter on the left and 1.9  cm diameter on the right. No pathologically enlarged lymph nodes in  the abdomen or pelvis.  Reproductive: Mild prostatomegaly.  Other: No pneumoperitoneum, ascites or  focal fluid collection.  Musculoskeletal: No aggressive appearing focal osseous lesions.  Moderate lumbar spondylosis.  VASCULAR MEASUREMENTS PERTINENT TO TAVR:  AORTA:  Minimal Aortic Diameter-12.4 x 11.2 mm  Severity of Aortic Calcification-severe  RIGHT PELVIS:  Right Common Iliac Artery -  Minimal Diameter-5.6 x 5.5 mm  Tortuosity-mild  Calcification-severe  Right External Iliac Artery -  Minimal Diameter-4.3 x 4.3 mm    Tortuosity-mild-to-moderate  Calcification-moderate to severe  Right Common Femoral Artery -  Minimal Diameter-4.4 x 4.2 mm  Tortuosity-mild  Calcification-severe  LEFT PELVIS:  Left Common Iliac Artery -  Minimal Diameter-9.7 x 9.1 mm  Tortuosity-mild  Calcification-severe  Left External Iliac Artery -  Minimal Diameter-3.8 x 2.8 mm  Tortuosity-mild-to-moderate  Calcification-severe  Left Common Femoral Artery -  Minimal Diameter-6.0 x 4.1 mm  Tortuosity-mild  Calcification-severe  Review of the MIP images confirms the above findings.  IMPRESSION:  1. Vascular findings and measurements pertinent to potential TAVR  procedure, as detailed. Note is made of diminutive external iliac  arteries bilaterally and diffuse severe atherosclerotic  calcification including along the ascending thoracic aorta.  2. Marked thickening and calcification of the aortic valve,  compatible with the reported history of symptomatic severe aortic  stenosis.  3. Mild cardiomegaly.  4. Several indeterminate renal cortical lesions in both kidneys,  largest 2.8 cm in the anterior upper right kidney, mildly increased  in size. MRI (preferred) or CT abdomen without and with IV contrast  recommended for further characterization, to exclude renal cell  carcinoma.  5. Infrarenal 3.2 cm Abdominal Aortic Aneurysm (ICD10-I71.9).  Recommend follow-up aortic ultrasound in 3 years. This  recommendation follows ACR consensus guidelines: White Paper of the  ACR Incidental Findings Committee II on Vascular Findings. J Am Coll  Radiol 2013; 10:789-794.  6. Bilateral common iliac artery aneurysms, stable, 2.8 cm on the  left and 1.9 cm on the right.  7. Chronic findings include: Aortic Atherosclerosis (ICD10-I70.0)  and Emphysema (ICD10-J43.9). Cholelithiasis. Small hiatal hernia.  Marked sigmoid diverticulosis. Mild prostatomegaly.  Electronically Signed  By: Ilona Sorrel M.D.  On: 08/21/2019 11:53    STS Risk  score: AVR   Risk of Mortality:  5.858%  Renal Failure:  2.334%  Permanent Stroke:  1.137%  Prolonged Ventilation:  19.999%  DSW Infection:  0.178%  Reoperation:  5.310%  Morbidity or Mortality:  23.237%  Short Length of Stay:  24.687%  Long Length of Stay:  10.460%    Impression:   This 74 year old gentleman has stage D, moderate to severe severe, low gradient, low ejection fraction symptomatic aortic stenosis as well as moderate to severe aortic insufficiency with New York Heart Association class III symptoms of exertional fatigue and shortness of breath. He has had a reduction in his left ventricular ejection fraction to 30 to 35% and this is all consistent with chronic combined systolic and diastolic congestive heart failure. I have personally reviewed his 2D echocardiogram, cardiac catheterization, and CTA studies. His echocardiogram shows a a moderately calcified aortic valve with an indeterminate number of leaflets with restricted mobility. The mean gradient across the aortic valve was measured at 24 mmHg which is increased from his prior echocardiogram. He has moderate to severe aortic insufficiency which is progressed from mild aortic insufficiency on his prior echocardiogram. He has had progressive deterioration in his left ventricular systolic function with his ejection fraction decreasing from 50 to 55% in June 2019 to 30 to 35% now. I agree that aortic valve replacement is indicated in this  patient to improve his symptoms and prevent further left ventricular deterioration. His cardiac catheterization shows severe three-vessel coronary disease with a patent left internal mammary graft to the LAD. The right coronary artery is chronically occluded. There is a patent stent in the left main into the proximal left circumflex and first obtuse marginal branch. There is moderate restenosis in the stented segment but it does not appear to be flow-limiting and unchanged from his prior cath  in 2019. The mean gradient measured across the aortic valve was 15 mmHg with an elevated LVEDP of 22 mmHg. I think TAVR would be the best option for this patient given his age, comorbid risk factors including prior coronary bypass surgery and extensive calcification of the ascending aorta. His gated cardiac CTA shows anatomy suitable for transcatheter aortic valve replacement using a Sapien 3 valve. His abdominal and pelvic CTA shows diffuse aortoiliac and femoral vascular disease with small external iliac artery lumens bilaterally. I do not think his pelvic vasculature is suitable for transfemoral insertion. He does have adequate sized subclavian arteries bilaterally to allow insertion. I think the left subclavian artery would give a more direct route into the ascending aorta since the innominate artery does take a fairly sharp turn after its origin. He has adequate sized common carotid arteries bilaterally but does have some evidence of internal carotid artery stenosis on the CT scan as well as on his carotid Doppler examinations.  The patient was counseled at length regarding treatment alternatives for management of severe symptomatic aortic stenosis. The risks and benefits of surgical intervention has been discussed in detail. Long-term prognosis with medical therapy was discussed. Alternative approaches such as conventional surgical aortic valve replacement, transcatheter aortic valve replacement, and palliative medical therapy were compared and contrasted at length. This discussion was placed in the context of the patient's own specific clinical presentation and past medical history. All of his questions have been addressed.  Following the decision to proceed with transcatheter aortic valve replacement, a discussion was held regarding what types of management strategies would be attempted intraoperatively in the event of life-threatening complications, including whether or not the patient would be considered  a candidate for the use of cardiopulmonary bypass and/or conversion to open sternotomy for attempted surgical intervention. Since he has had previous coronary artery bypass graft surgery and has a diffusely calcified ascending aorta I do not think he is a candidate for emergent sternotomy to manage any intraoperative complications. The patient has been advised of a variety of complications that might develop including but not limited to risks of death, stroke, paravalvular leak, aortic dissection or other major vascular complications, aortic annulus rupture, device embolization, cardiac rupture or perforation, mitral regurgitation, acute myocardial infarction, arrhythmia, heart block or bradycardia requiring permanent pacemaker placement, congestive heart failure, respiratory failure, renal failure, pneumonia, infection, other late complications related to structural valve deterioration or migration, or other complications that might ultimately cause a temporary or permanent loss of functional independence or other long term morbidity. The patient provides full informed consent for the procedure as described and all questions were answered.    Plan:   Ttranscatheter aortic valve replacement using a Sapien 3 valve via the left subclavian artery approach.   Gaye Pollack, MD

## 2019-09-03 NOTE — Anesthesia Preprocedure Evaluation (Addendum)
Anesthesia Evaluation  Patient identified by MRN, date of birth, ID band Patient awake    Reviewed: Allergy & Precautions, NPO status , Patient's Chart, lab work & pertinent test results  Airway Mallampati: II  TM Distance: >3 FB Neck ROM: Full    Dental no notable dental hx.    Pulmonary shortness of breath and with exertion, COPD, Current SmokerPatient did not abstain from smoking.,    Pulmonary exam normal breath sounds clear to auscultation       Cardiovascular hypertension, Pt. on medications + angina + CAD, + Past MI, + Cardiac Stents, + CABG and +CHF  + Valvular Problems/Murmurs AS and AI  Rhythm:Regular Rate:Normal + Systolic murmurs ECG: NSR, rate 60  ECHO: 1. Left ventricular ejection fraction, by visual estimation, is 30 to 35%. The left ventricle has moderate to severely decreased function. Left ventricular septal wall thickness was normal. Normal left ventricular posterior wall thickness. There is no  left ventricular hypertrophy.  2. Left ventricular diastolic Doppler parameters are consistent with pseudonormalization pattern of LV diastolic filling.  3. GLS = -13.2%. 4. Global right ventricle has normal systolic function.The right ventricular size is normal. No increase in right ventricular wall thickness.  5. Left atrial size was moderately dilated.  6. Right atrial size was normal.  7. The mitral valve is normal in structure. Mild mitral valve regurgitation.  8. The tricuspid valve is normal in structure. Tricuspid valve regurgitation was not visualized by color flow Doppler.  9. The aortic valve The aortic valve has an indeterminant number of cusps Aortic valve regurgitation is moderate to severe by color flow Doppler. Moderate aortic valve stenosis. 10. The pulmonic valve was normal in structure. Pulmonic valve regurgitation is trivial by color flow Doppler. 11. The atrial septum is grossly  normal.  CATH: Non-stenotic Ost LM to LM lesion was previously treated. Ost LAD to Prox LAD lesion is 100% stenosed. Non-stenotic Dist LAD lesion was previously treated. Non-stenotic Ost 1st Mrg lesion was previously treated. Ost Cx to Prox Cx lesion is 50% stenosed. Ost RCA to Prox RCA lesion is 100% stenosed. Ost LM to Dist LM lesion is 40% stenosed. SVG graft was not visualized. Origin to Prox Graft lesion is 100% stenosed. SVG graft was not visualized. Origin to Prox Graft lesion is 100% stenosed. 1. Severe triple vessel CAD s/p 3V CABG. The only patent graft is the LIMA to the LAD.  2. Chronic occlusion ostial RCA (not injected). The distal RCA/PDA fills from left to right collaterals from the LAD and from the Circumflex.  3. Patent left main stent into the proximal Circumflex and first obtuse marginal branch. There is moderate restenosis in this stented segment but it does not appear to be flow limiting and is unchanged from last cath in 2019.  4. Chronic occlusion proximal LAD. The mid and distal LAD fills from the patent LIMA graft. The stent from the LAD into the LIMA graft is patent without restenosis.  5. Moderate to severe aortic stenosis by echo. By cath the gradients across the valve are low (mean gradient 15 mmHg). He is felt to have lower gradients due to his LV systolic dysfunction. Also with moderately severe AI by echo and fall in LV systolic function.    Neuro/Psych negative neurological ROS  negative psych ROS   GI/Hepatic Neg liver ROS, GERD  Medicated and Controlled,  Endo/Other  negative endocrine ROS  Renal/GU negative Renal ROS     Musculoskeletal negative musculoskeletal ROS (+)  Abdominal   Peds  Hematology negative hematology ROS (+)   Anesthesia Other Findings Severe Aortic Stenosis  Reproductive/Obstetrics                          Anesthesia Physical Anesthesia Plan  ASA: IV  Anesthesia Plan: General   Post-op  Pain Management:    Induction: Intravenous  PONV Risk Score and Plan: 1 and Ondansetron, Dexamethasone, Midazolam and Treatment may vary due to age or medical condition  Airway Management Planned: Oral ETT  Additional Equipment: Arterial line and TEE  Intra-op Plan:   Post-operative Plan: Extubation in OR  Informed Consent: I have reviewed the patients History and Physical, chart, labs and discussed the procedure including the risks, benefits and alternatives for the proposed anesthesia with the patient or authorized representative who has indicated his/her understanding and acceptance.     Dental advisory given  Plan Discussed with: CRNA  Anesthesia Plan Comments: (Reviewed PAT note written 09/03/2019 by Myra Gianotti, PA-C. )      Anesthesia Quick Evaluation

## 2019-09-04 ENCOUNTER — Inpatient Hospital Stay (HOSPITAL_COMMUNITY): Payer: Medicare Other

## 2019-09-04 ENCOUNTER — Inpatient Hospital Stay (HOSPITAL_COMMUNITY): Payer: Medicare Other | Admitting: Vascular Surgery

## 2019-09-04 ENCOUNTER — Other Ambulatory Visit: Payer: Self-pay

## 2019-09-04 ENCOUNTER — Inpatient Hospital Stay (HOSPITAL_COMMUNITY): Payer: Medicare Other | Admitting: Certified Registered"

## 2019-09-04 ENCOUNTER — Inpatient Hospital Stay (HOSPITAL_COMMUNITY)
Admission: RE | Admit: 2019-09-04 | Discharge: 2019-09-05 | DRG: 267 | Disposition: A | Discharge status: Home / Self Care | Payer: Medicare Other | Attending: Cardiovascular Disease | Admitting: Cardiovascular Disease

## 2019-09-04 ENCOUNTER — Encounter (HOSPITAL_COMMUNITY): Payer: Self-pay | Admitting: *Deleted

## 2019-09-04 ENCOUNTER — Encounter (HOSPITAL_COMMUNITY)
Admission: RE | Disposition: A | Discharge status: Home / Self Care | Payer: Self-pay | Source: Home / Self Care | Attending: Cardiovascular Disease

## 2019-09-04 DIAGNOSIS — Z79899 Other long term (current) drug therapy: Secondary | ICD-10-CM | POA: Diagnosis not present

## 2019-09-04 DIAGNOSIS — Z953 Presence of xenogenic heart valve: Secondary | ICD-10-CM

## 2019-09-04 DIAGNOSIS — I5022 Chronic systolic (congestive) heart failure: Secondary | ICD-10-CM | POA: Diagnosis present

## 2019-09-04 DIAGNOSIS — J449 Chronic obstructive pulmonary disease, unspecified: Secondary | ICD-10-CM | POA: Diagnosis present

## 2019-09-04 DIAGNOSIS — F17210 Nicotine dependence, cigarettes, uncomplicated: Secondary | ICD-10-CM | POA: Diagnosis present

## 2019-09-04 DIAGNOSIS — I252 Old myocardial infarction: Secondary | ICD-10-CM

## 2019-09-04 DIAGNOSIS — Z006 Encounter for examination for normal comparison and control in clinical research program: Secondary | ICD-10-CM

## 2019-09-04 DIAGNOSIS — Z7982 Long term (current) use of aspirin: Secondary | ICD-10-CM | POA: Diagnosis not present

## 2019-09-04 DIAGNOSIS — I110 Hypertensive heart disease with heart failure: Secondary | ICD-10-CM | POA: Diagnosis present

## 2019-09-04 DIAGNOSIS — I25810 Atherosclerosis of coronary artery bypass graft(s) without angina pectoris: Secondary | ICD-10-CM | POA: Diagnosis present

## 2019-09-04 DIAGNOSIS — Z7902 Long term (current) use of antithrombotics/antiplatelets: Secondary | ICD-10-CM | POA: Diagnosis not present

## 2019-09-04 DIAGNOSIS — E785 Hyperlipidemia, unspecified: Secondary | ICD-10-CM | POA: Diagnosis present

## 2019-09-04 DIAGNOSIS — Z955 Presence of coronary angioplasty implant and graft: Secondary | ICD-10-CM | POA: Diagnosis not present

## 2019-09-04 DIAGNOSIS — I5042 Chronic combined systolic (congestive) and diastolic (congestive) heart failure: Secondary | ICD-10-CM | POA: Diagnosis present

## 2019-09-04 DIAGNOSIS — Z825 Family history of asthma and other chronic lower respiratory diseases: Secondary | ICD-10-CM

## 2019-09-04 DIAGNOSIS — I5023 Acute on chronic systolic (congestive) heart failure: Secondary | ICD-10-CM | POA: Diagnosis not present

## 2019-09-04 DIAGNOSIS — I714 Abdominal aortic aneurysm, without rupture: Secondary | ICD-10-CM | POA: Diagnosis present

## 2019-09-04 DIAGNOSIS — I10 Essential (primary) hypertension: Secondary | ICD-10-CM | POA: Diagnosis present

## 2019-09-04 DIAGNOSIS — I350 Nonrheumatic aortic (valve) stenosis: Principal | ICD-10-CM | POA: Diagnosis present

## 2019-09-04 DIAGNOSIS — I2510 Atherosclerotic heart disease of native coronary artery without angina pectoris: Secondary | ICD-10-CM | POA: Diagnosis present

## 2019-09-04 DIAGNOSIS — I255 Ischemic cardiomyopathy: Secondary | ICD-10-CM | POA: Diagnosis present

## 2019-09-04 DIAGNOSIS — Z952 Presence of prosthetic heart valve: Secondary | ICD-10-CM | POA: Diagnosis not present

## 2019-09-04 DIAGNOSIS — Z8249 Family history of ischemic heart disease and other diseases of the circulatory system: Secondary | ICD-10-CM

## 2019-09-04 DIAGNOSIS — J439 Emphysema, unspecified: Secondary | ICD-10-CM | POA: Diagnosis not present

## 2019-09-04 HISTORY — PX: ULTRASOUND GUIDANCE FOR VASCULAR ACCESS: SHX6516

## 2019-09-04 HISTORY — PX: TEE WITHOUT CARDIOVERSION: SHX5443

## 2019-09-04 LAB — POCT I-STAT, CHEM 8
BUN: 24 mg/dL — ABNORMAL HIGH (ref 8–23)
BUN: 22 mg/dL (ref 8–23)
BUN: 23 mg/dL (ref 8–23)
BUN: 23 mg/dL (ref 8–23)
Calcium, Ion: 1.23 mmol/L (ref 1.15–1.40)
Calcium, Ion: 1.17 mmol/L (ref 1.15–1.40)
Calcium, Ion: 1.2 mmol/L (ref 1.15–1.40)
Calcium, Ion: 1.19 mmol/L (ref 1.15–1.40)
Chloride: 104 mmol/L (ref 98–111)
Chloride: 104 mmol/L (ref 98–111)
Chloride: 105 mmol/L (ref 98–111)
Chloride: 105 mmol/L (ref 98–111)
Creatinine, Ser: 0.9 mg/dL (ref 0.61–1.24)
Creatinine, Ser: 0.9 mg/dL (ref 0.61–1.24)
Creatinine, Ser: 1.2 mg/dL (ref 0.61–1.24)
Creatinine, Ser: 1.2 mg/dL (ref 0.61–1.24)
Glucose, Bld: 90 mg/dL (ref 70–99)
Glucose, Bld: 102 mg/dL — ABNORMAL HIGH (ref 70–99)
Glucose, Bld: 147 mg/dL — ABNORMAL HIGH (ref 70–99)
Glucose, Bld: 112 mg/dL — ABNORMAL HIGH (ref 70–99)
HCT: 41 % (ref 39.0–52.0)
HCT: 38 % — ABNORMAL LOW (ref 39.0–52.0)
HCT: 40 % (ref 39.0–52.0)
HCT: 38 % — ABNORMAL LOW (ref 39.0–52.0)
Hemoglobin: 13.9 g/dL (ref 13.0–17.0)
Hemoglobin: 12.9 g/dL — ABNORMAL LOW (ref 13.0–17.0)
Hemoglobin: 13.6 g/dL (ref 13.0–17.0)
Hemoglobin: 12.9 g/dL — ABNORMAL LOW (ref 13.0–17.0)
Potassium: 4.3 mmol/L (ref 3.5–5.1)
Potassium: 4.4 mmol/L (ref 3.5–5.1)
Potassium: 4.5 mmol/L (ref 3.5–5.1)
Potassium: 4.6 mmol/L (ref 3.5–5.1)
Sodium: 138 mmol/L (ref 135–145)
Sodium: 137 mmol/L (ref 135–145)
Sodium: 137 mmol/L (ref 135–145)
Sodium: 139 mmol/L (ref 135–145)
TCO2: 25 mmol/L (ref 22–32)
TCO2: 23 mmol/L (ref 22–32)
TCO2: 24 mmol/L (ref 22–32)
TCO2: 22 mmol/L (ref 22–32)

## 2019-09-04 SURGERY — IMPLANTATION, AORTIC VALVE, TRANSCATHETER, SUBCLAVIAN ARTERY APPROACH
Anesthesia: General | Site: Groin | Laterality: Right

## 2019-09-04 MED ORDER — ONDANSETRON HCL 4 MG/2ML IJ SOLN
INTRAMUSCULAR | Status: AC
  Filled 2019-09-04: qty 2

## 2019-09-04 MED ORDER — SODIUM CHLORIDE 0.9 % IV SOLN
INTRAVENOUS | Status: DC | PRN
  Administered 2019-09-04 (×3): 500 mL

## 2019-09-04 MED ORDER — CHLORHEXIDINE GLUCONATE 4 % EX LIQD: 60.0000 mL | Freq: Once | CUTANEOUS | Status: DC

## 2019-09-04 MED ORDER — PROPOFOL 10 MG/ML IV BOLUS
INTRAVENOUS | Status: AC
  Filled 2019-09-04: qty 20

## 2019-09-04 MED ORDER — ISOSORBIDE MONONITRATE ER 60 MG PO TB24
60.0000 mg | ORAL_TABLET | Freq: Two times a day (BID) | ORAL | Status: DC
  Administered 2019-09-04 – 2019-09-05 (×2): 60 mg via ORAL
  Filled 2019-09-04 (×2): qty 1

## 2019-09-04 MED ORDER — MIDAZOLAM HCL 2 MG/2ML IJ SOLN
INTRAMUSCULAR | Status: DC | PRN
  Administered 2019-09-04: 1 mg via INTRAVENOUS

## 2019-09-04 MED ORDER — LIDOCAINE 2% (20 MG/ML) 5 ML SYRINGE
INTRAMUSCULAR | Status: AC
  Filled 2019-09-04: qty 5

## 2019-09-04 MED ORDER — MIDAZOLAM HCL 2 MG/2ML IJ SOLN
INTRAMUSCULAR | Status: AC
  Filled 2019-09-04: qty 2

## 2019-09-04 MED ORDER — SODIUM CHLORIDE 0.9% FLUSH
3.0000 mL | Freq: Two times a day (BID) | INTRAVENOUS | Status: DC
  Administered 2019-09-04 – 2019-09-05 (×2): 3 mL via INTRAVENOUS

## 2019-09-04 MED ORDER — ACETAMINOPHEN 160 MG/5ML PO SOLN: 1000.0000 mg | Freq: Once | ORAL | Status: DC

## 2019-09-04 MED ORDER — PROPOFOL 10 MG/ML IV BOLUS
INTRAVENOUS | Status: DC | PRN
  Administered 2019-09-04: 120 mg via INTRAVENOUS
  Administered 2019-09-04: 10 mg via INTRAVENOUS

## 2019-09-04 MED ORDER — BUDESONIDE 0.25 MG/2ML IN SUSP
0.2500 mg | Freq: Two times a day (BID) | RESPIRATORY_TRACT | Status: DC
  Administered 2019-09-04 – 2019-09-05 (×2): 0.25 mg via RESPIRATORY_TRACT
  Filled 2019-09-04 (×2): qty 2

## 2019-09-04 MED ORDER — ROCURONIUM BROMIDE 10 MG/ML (PF) SYRINGE
PREFILLED_SYRINGE | INTRAVENOUS | Status: DC | PRN
  Administered 2019-09-04: 60 mg via INTRAVENOUS

## 2019-09-04 MED ORDER — CHLORHEXIDINE GLUCONATE 0.12 % MT SOLN
OROMUCOSAL | Status: AC
  Administered 2019-09-04: 15 mL via OROMUCOSAL
  Filled 2019-09-04: qty 15

## 2019-09-04 MED ORDER — 0.9 % SODIUM CHLORIDE (POUR BTL) OPTIME
TOPICAL | Status: DC | PRN
  Administered 2019-09-04: 2000 mL

## 2019-09-04 MED ORDER — ACETAMINOPHEN 500 MG PO TABS
ORAL_TABLET | ORAL | Status: AC
  Administered 2019-09-04: 1000 mg
  Filled 2019-09-04: qty 2

## 2019-09-04 MED ORDER — ATORVASTATIN CALCIUM 80 MG PO TABS
80.0000 mg | ORAL_TABLET | Freq: Every day | ORAL | Status: DC
  Administered 2019-09-04: 80 mg via ORAL
  Filled 2019-09-04: qty 1

## 2019-09-04 MED ORDER — SODIUM CHLORIDE 0.9 % IV SOLN: 250.0000 mL | INTRAVENOUS | Status: DC | PRN

## 2019-09-04 MED ORDER — PHENYLEPHRINE HCL-NACL 20-0.9 MG/250ML-% IV SOLN
0.0000 ug/min | INTRAVENOUS | Status: DC
  Filled 2019-09-04: qty 250

## 2019-09-04 MED ORDER — LIDOCAINE 2% (20 MG/ML) 5 ML SYRINGE
INTRAMUSCULAR | Status: DC | PRN
  Administered 2019-09-04: 40 mg via INTRAVENOUS

## 2019-09-04 MED ORDER — HEPARIN SODIUM (PORCINE) 1000 UNIT/ML IJ SOLN
INTRAMUSCULAR | Status: AC
  Filled 2019-09-04: qty 1

## 2019-09-04 MED ORDER — LACTATED RINGERS IV SOLN
INTRAVENOUS | Status: DC
  Administered 2019-09-04: 1000 mL via INTRAVENOUS

## 2019-09-04 MED ORDER — FENTANYL CITRATE (PF) 250 MCG/5ML IJ SOLN
INTRAMUSCULAR | Status: AC
  Filled 2019-09-04: qty 5

## 2019-09-04 MED ORDER — BISOPROLOL FUMARATE 5 MG PO TABS
2.5000 mg | ORAL_TABLET | Freq: Two times a day (BID) | ORAL | Status: DC
  Administered 2019-09-04 – 2019-09-05 (×2): 2.5 mg via ORAL
  Filled 2019-09-04 (×2): qty 1

## 2019-09-04 MED ORDER — MORPHINE SULFATE (PF) 2 MG/ML IV SOLN: 1.0000 mg | INTRAVENOUS | Status: DC | PRN

## 2019-09-04 MED ORDER — FENTANYL CITRATE (PF) 250 MCG/5ML IJ SOLN
INTRAMUSCULAR | Status: DC | PRN
  Administered 2019-09-04: 50 ug via INTRAVENOUS

## 2019-09-04 MED ORDER — LORATADINE 10 MG PO TABS
10.0000 mg | ORAL_TABLET | Freq: Every day | ORAL | Status: DC
  Administered 2019-09-05: 10 mg via ORAL
  Filled 2019-09-04: qty 1

## 2019-09-04 MED ORDER — DEXAMETHASONE SODIUM PHOSPHATE 10 MG/ML IJ SOLN
INTRAMUSCULAR | Status: DC | PRN
  Administered 2019-09-04: 10 mg via INTRAVENOUS

## 2019-09-04 MED ORDER — NITROGLYCERIN 0.4 MG SL SUBL: 0.4000 mg | SUBLINGUAL_TABLET | SUBLINGUAL | Status: DC | PRN

## 2019-09-04 MED ORDER — TRAMADOL HCL 50 MG PO TABS: 50.0000 mg | ORAL_TABLET | ORAL | Status: DC | PRN

## 2019-09-04 MED ORDER — ONDANSETRON HCL 4 MG/2ML IJ SOLN
INTRAMUSCULAR | Status: DC | PRN
  Administered 2019-09-04: 4 mg via INTRAVENOUS

## 2019-09-04 MED ORDER — IODIXANOL 320 MG/ML IV SOLN
INTRAVENOUS | Status: DC | PRN
  Administered 2019-09-04: 150 mL

## 2019-09-04 MED ORDER — DEXAMETHASONE SODIUM PHOSPHATE 10 MG/ML IJ SOLN
INTRAMUSCULAR | Status: AC
  Filled 2019-09-04: qty 1

## 2019-09-04 MED ORDER — ROCURONIUM BROMIDE 10 MG/ML (PF) SYRINGE
PREFILLED_SYRINGE | INTRAVENOUS | Status: AC
  Filled 2019-09-04: qty 10

## 2019-09-04 MED ORDER — SUGAMMADEX SODIUM 200 MG/2ML IV SOLN
INTRAVENOUS | Status: DC | PRN
  Administered 2019-09-04: 100 mg via INTRAVENOUS

## 2019-09-04 MED ORDER — AMLODIPINE BESYLATE 5 MG PO TABS
5.0000 mg | ORAL_TABLET | Freq: Every day | ORAL | Status: DC
  Administered 2019-09-05: 5 mg via ORAL
  Filled 2019-09-04: qty 1

## 2019-09-04 MED ORDER — FUROSEMIDE 40 MG PO TABS
40.0000 mg | ORAL_TABLET | Freq: Two times a day (BID) | ORAL | Status: DC
  Administered 2019-09-04 – 2019-09-05 (×2): 40 mg via ORAL
  Filled 2019-09-04 (×2): qty 1

## 2019-09-04 MED ORDER — CLOPIDOGREL BISULFATE 75 MG PO TABS
75.0000 mg | ORAL_TABLET | Freq: Every day | ORAL | Status: DC
  Administered 2019-09-05: 75 mg via ORAL
  Filled 2019-09-04: qty 1

## 2019-09-04 MED ORDER — ONDANSETRON HCL 4 MG/2ML IJ SOLN: 4.0000 mg | Freq: Four times a day (QID) | INTRAMUSCULAR | Status: DC | PRN

## 2019-09-04 MED ORDER — PROTAMINE SULFATE 10 MG/ML IV SOLN
INTRAVENOUS | Status: DC | PRN
  Administered 2019-09-04: 70 mg via INTRAVENOUS

## 2019-09-04 MED ORDER — ACETAMINOPHEN 650 MG RE SUPP: 650.0000 mg | Freq: Four times a day (QID) | RECTAL | Status: DC | PRN

## 2019-09-04 MED ORDER — SODIUM CHLORIDE 0.9 % IV SOLN
1.5000 g | Freq: Two times a day (BID) | INTRAVENOUS | Status: DC
  Administered 2019-09-04 – 2019-09-05 (×2): 1.5 g via INTRAVENOUS
  Filled 2019-09-04 (×4): qty 1.5

## 2019-09-04 MED ORDER — SODIUM CHLORIDE 0.9 % IV SOLN
INTRAVENOUS | Status: DC
  Administered 2019-09-04: 1000 mL via INTRAVENOUS

## 2019-09-04 MED ORDER — ACETAMINOPHEN 325 MG PO TABS: 650.0000 mg | ORAL_TABLET | Freq: Four times a day (QID) | ORAL | Status: DC | PRN

## 2019-09-04 MED ORDER — SPIRONOLACTONE 12.5 MG HALF TABLET
12.5000 mg | ORAL_TABLET | Freq: Two times a day (BID) | ORAL | Status: DC
  Administered 2019-09-04 – 2019-09-05 (×2): 12.5 mg via ORAL
  Filled 2019-09-04 (×3): qty 1

## 2019-09-04 MED ORDER — SODIUM CHLORIDE 0.9 % IV SOLN
INTRAVENOUS | Status: AC
  Filled 2019-09-04 (×3): qty 1.2

## 2019-09-04 MED ORDER — CHLORHEXIDINE GLUCONATE 4 % EX LIQD: 30.0000 mL | CUTANEOUS | Status: DC

## 2019-09-04 MED ORDER — HEPARIN SODIUM (PORCINE) 1000 UNIT/ML IJ SOLN
INTRAMUSCULAR | Status: DC | PRN
  Administered 2019-09-04: 7000 [IU] via INTRAVENOUS

## 2019-09-04 MED ORDER — SODIUM CHLORIDE 0.9% FLUSH: 3.0000 mL | INTRAVENOUS | Status: DC | PRN

## 2019-09-04 MED ORDER — PANTOPRAZOLE SODIUM 40 MG PO TBEC
40.0000 mg | DELAYED_RELEASE_TABLET | Freq: Every day | ORAL | Status: DC
  Administered 2019-09-05: 40 mg via ORAL
  Filled 2019-09-04: qty 1

## 2019-09-04 MED ORDER — OXYCODONE HCL 5 MG PO TABS: 5.0000 mg | ORAL_TABLET | ORAL | Status: DC | PRN

## 2019-09-04 MED ORDER — ASPIRIN 81 MG PO CHEW
81.0000 mg | CHEWABLE_TABLET | Freq: Every day | ORAL | Status: DC
  Administered 2019-09-05: 81 mg via ORAL
  Filled 2019-09-04: qty 1

## 2019-09-04 MED ORDER — VANCOMYCIN HCL IN DEXTROSE 1-5 GM/200ML-% IV SOLN
1000.0000 mg | Freq: Once | INTRAVENOUS | Status: AC
  Administered 2019-09-04: 1000 mg via INTRAVENOUS
  Filled 2019-09-04: qty 200

## 2019-09-04 MED ORDER — CHLORHEXIDINE GLUCONATE 0.12 % MT SOLN
15.0000 mL | Freq: Once | OROMUCOSAL | Status: AC
  Administered 2019-09-04: 09:00:00 15 mL via OROMUCOSAL

## 2019-09-04 MED ORDER — METOPROLOL TARTRATE 5 MG/5ML IV SOLN: 2.5000 mg | INTRAVENOUS | Status: DC | PRN

## 2019-09-04 MED ORDER — SODIUM CHLORIDE 0.9 % IV SOLN: INTRAVENOUS | Status: AC

## 2019-09-04 SURGICAL SUPPLY — 91 items
ADH SKN CLS APL DERMABOND .7 (GAUZE/BANDAGES/DRESSINGS) ×3
BAG DECANTER FOR FLEXI CONT (MISCELLANEOUS) IMPLANT
BAG SNAP BAND KOVER 36X36 (MISCELLANEOUS) ×7 IMPLANT
BLADE CLIPPER SURG (BLADE) IMPLANT
BLADE OSCILLATING /SAGITTAL (BLADE) IMPLANT
BLADE STERNUM SYSTEM 6 (BLADE) IMPLANT
CABLE ADAPT CONN TEMP 6FT (ADAPTER) ×5 IMPLANT
CANNULA FEM VENOUS REMOTE 22FR (CANNULA) IMPLANT
CANNULA OPTISITE PERFUSION 16F (CANNULA) IMPLANT
CANNULA OPTISITE PERFUSION 18F (CANNULA) IMPLANT
CATH DIAG EXPO 6F AL1 (CATHETERS) IMPLANT
CATH DIAG EXPO 6F VENT PIG 145 (CATHETERS) ×10 IMPLANT
CATH EXTERNAL FEMALE PUREWICK (CATHETERS) IMPLANT
CATH INFINITI 6F AL2 (CATHETERS) IMPLANT
CATH S G BIP PACING (CATHETERS) ×5 IMPLANT
CLIP VESOCCLUDE MED 24/CT (CLIP) ×3 IMPLANT
CLIP VESOCCLUDE SM WIDE 24/CT (CLIP) ×4 IMPLANT
CLOSURE MYNX CONTROL 6F/7F (Vascular Products) ×2 IMPLANT
CONT SPEC 4OZ CLIKSEAL STRL BL (MISCELLANEOUS) ×10 IMPLANT
COVER BACK TABLE 80X110 HD (DRAPES) ×10 IMPLANT
COVER DOME SNAP 22 D (MISCELLANEOUS) ×2 IMPLANT
COVER WAND RF STERILE (DRAPES) ×5 IMPLANT
DERMABOND ADVANCED (GAUZE/BANDAGES/DRESSINGS) ×2
DERMABOND ADVANCED .7 DNX12 (GAUZE/BANDAGES/DRESSINGS) ×3 IMPLANT
DRAPE INCISE IOBAN 66X45 STRL (DRAPES) IMPLANT
DRSG TEGADERM 4X4.75 (GAUZE/BANDAGES/DRESSINGS) ×7 IMPLANT
ELECT CAUTERY BLADE 6.4 (BLADE) ×3 IMPLANT
ELECT REM PT RETURN 9FT ADLT (ELECTROSURGICAL) ×10
ELECTRODE REM PT RTRN 9FT ADLT (ELECTROSURGICAL) ×6 IMPLANT
FELT TEFLON 6X6 (MISCELLANEOUS) IMPLANT
FEMORAL VENOUS CANN RAP (CANNULA) IMPLANT
GAUZE SPONGE 4X4 12PLY STRL (GAUZE/BANDAGES/DRESSINGS) ×5 IMPLANT
GAUZE SPONGE 4X4 12PLY STRL LF (GAUZE/BANDAGES/DRESSINGS) ×2 IMPLANT
GLOVE BIO SURGEON STRL SZ7.5 (GLOVE) ×6 IMPLANT
GLOVE BIO SURGEON STRL SZ8 (GLOVE) ×6 IMPLANT
GLOVE EUDERMIC 7 POWDERFREE (GLOVE) IMPLANT
GLOVE ORTHO TXT STRL SZ7.5 (GLOVE) IMPLANT
GLOVE SURG SS PI 6.5 STRL IVOR (GLOVE) ×6 IMPLANT
GOWN STRL REUS W/ TWL LRG LVL3 (GOWN DISPOSABLE) IMPLANT
GOWN STRL REUS W/ TWL XL LVL3 (GOWN DISPOSABLE) ×4 IMPLANT
GOWN STRL REUS W/TWL LRG LVL3 (GOWN DISPOSABLE) ×25
GOWN STRL REUS W/TWL XL LVL3 (GOWN DISPOSABLE) ×10
GUIDEWIRE SAFE TJ AMPLATZ EXST (WIRE) ×5 IMPLANT
INSERT FOGARTY SM (MISCELLANEOUS) ×3 IMPLANT
KIT BASIN OR (CUSTOM PROCEDURE TRAY) ×5 IMPLANT
KIT DILATOR VASC 18G NDL (KITS) IMPLANT
KIT HEART LEFT (KITS) ×5 IMPLANT
KIT SUCTION CATH 14FR (SUCTIONS) IMPLANT
KIT TURNOVER KIT B (KITS) ×5 IMPLANT
LOOP VESSEL MAXI BLUE (MISCELLANEOUS) IMPLANT
LOOP VESSEL MINI RED (MISCELLANEOUS) IMPLANT
NDL PERC 18GX7CM (NEEDLE) ×3 IMPLANT
NEEDLE 22X1 1/2 (OR ONLY) (NEEDLE) IMPLANT
NEEDLE PERC 18GX7CM (NEEDLE) ×5 IMPLANT
NS IRRIG 1000ML POUR BTL (IV SOLUTION) ×15 IMPLANT
PACK ENDOVASCULAR (PACKS) ×5 IMPLANT
PAD ARMBOARD 7.5X6 YLW CONV (MISCELLANEOUS) ×10 IMPLANT
PAD ELECT DEFIB RADIOL ZOLL (MISCELLANEOUS) ×5 IMPLANT
PENCIL BUTTON HOLSTER BLD 10FT (ELECTRODE) ×5 IMPLANT
POSITIONER HEAD DONUT 9IN (MISCELLANEOUS) ×5 IMPLANT
SHEATH BRITE TIP 7FR 35CM (SHEATH) ×5 IMPLANT
SHEATH PINNACLE 6F 10CM (SHEATH) ×5 IMPLANT
SHEATH PINNACLE 8F 10CM (SHEATH) ×5 IMPLANT
SLEEVE REPOSITIONING LENGTH 30 (MISCELLANEOUS) ×5 IMPLANT
SPONGE LAP 4X18 RFD (DISPOSABLE) IMPLANT
STOPCOCK MORSE 400PSI 3WAY (MISCELLANEOUS) ×10 IMPLANT
SUT ETHIBOND X763 2 0 SH 1 (SUTURE) IMPLANT
SUT GORETEX CV 4 TH 22 36 (SUTURE) ×2 IMPLANT
SUT GORETEX CV4 TH-18 (SUTURE) ×6 IMPLANT
SUT MNCRL AB 3-0 PS2 18 (SUTURE) IMPLANT
SUT PROLENE 5 0 C 1 36 (SUTURE) ×3 IMPLANT
SUT PROLENE 6 0 C 1 30 (SUTURE) IMPLANT
SUT SILK  1 MH (SUTURE) ×2
SUT SILK 1 MH (SUTURE) ×3 IMPLANT
SUT SILK 2 0 SH CR/8 (SUTURE) ×3 IMPLANT
SUT SILK 3 0 (SUTURE) ×5
SUT SILK 3-0 18XBRD TIE 12 (SUTURE) ×1 IMPLANT
SUT VIC AB 2-0 CT1 27 (SUTURE) ×10
SUT VIC AB 2-0 CT1 TAPERPNT 27 (SUTURE) ×2 IMPLANT
SUT VIC AB 2-0 CTX 36 (SUTURE) ×2 IMPLANT
SUT VIC AB 3-0 SH 8-18 (SUTURE) IMPLANT
SYR 50ML LL SCALE MARK (SYRINGE) ×5 IMPLANT
SYR BULB IRRIGATION 50ML (SYRINGE) IMPLANT
SYR CONTROL 10ML LL (SYRINGE) IMPLANT
TOWEL GREEN STERILE (TOWEL DISPOSABLE) ×5 IMPLANT
TOWEL GREEN STERILE FF (TOWEL DISPOSABLE) ×5 IMPLANT
TRANSDUCER W/STOPCOCK (MISCELLANEOUS) ×10 IMPLANT
TRAY FOLEY SLVR 16FR TEMP STAT (SET/KITS/TRAYS/PACK) IMPLANT
VALVE 26 ULTRA SAPIEN KIT (Valve) ×4 IMPLANT
WIRE EMERALD 3MM-J .035X150CM (WIRE) ×5 IMPLANT
WIRE EMERALD 3MM-J .035X260CM (WIRE) ×5 IMPLANT

## 2019-09-04 NOTE — Progress Notes (Addendum)
Patient received from PACU. Pt. Alert and oriented neuro checks WDL. CHG bath completed. CCMD notified. VSS. Oriented to room and call light.  Paulene Floor, RN  4:24 PM 09/04/2019

## 2019-09-04 NOTE — Progress Notes (Signed)
Rt radial arterial line removed, manual pressure held for 5 minutes, gauze and tegaderm dressing. Level 0, rt radial pulse 2+

## 2019-09-04 NOTE — Interval H&P Note (Signed)
History and Physical Interval Note:  09/04/2019 11:38 AM  Steve Durham  has presented today for surgery, with the diagnosis of Severe Aortic Stenosis.  The various methods of treatment have been discussed with the patient and family. After consideration of risks, benefits and other options for treatment, the patient has consented to  Procedure(s): TRANSCATHETER AORTIC VALVE REPLACEMENT, LEFT SUBCLAVIAN (Left) TRANSESOPHAGEAL ECHOCARDIOGRAM (TEE) (N/A) as a surgical intervention.  The patient's history has been reviewed, patient examined, no change in status, stable for surgery.  I have reviewed the patient's chart and labs.  Questions were answered to the patient's satisfaction.     Gaye Pollack

## 2019-09-04 NOTE — Op Note (Signed)
[facility]   MULTIDISCIPLINARY HEART VALVE TEAM   TAVR OPERATIVE NOTE   Date of Procedure:  09/04/2019  Preoperative Diagnosis: Severe Aortic Stenosis   Postoperative Diagnosis: Same   Procedure:    Transcatheter Aortic Valve Replacement - Left Subclavian Approach  Edwards Sapien 3 Ultra THV (size 26 mm, model # [id number], serial # [id number])   Co-Surgeons:  Gaye Pollack, MD and Lauree Chandler, MD  Anesthesiologist:  Perfecto Kingdom, MD  Echocardiographer:  Liane Comber, MD  Pre-operative Echo Findings:  Severe aortic stenosis  Moderate left ventricular systolic dysfunction  Post-operative Echo Findings:  No paravalvular leak  Moderate left ventricular systolic dysfunction   BRIEF CLINICAL NOTE AND INDICATIONS FOR SURGERY  This 73 year old gentleman has stage D, moderate to severe severe, low gradient, low ejection fraction symptomatic aortic stenosis as well as moderate to severe aortic insufficiency with New York Heart Association class III symptoms of exertional fatigue and shortness of breath. He has had a reduction in his left ventricular ejection fraction to 30 to 35% and this is all consistent with chronic combined systolic and diastolic congestive heart failure. I have personally reviewed his 2D echocardiogram, cardiac catheterization, and CTA studies. His echocardiogram shows a a moderately calcified aortic valve with an indeterminate number of leaflets with restricted mobility. The mean gradient across the aortic valve was measured at 24 mmHg which is increased from his prior echocardiogram. He has moderate to severe aortic insufficiency which is progressed from mild aortic insufficiency on his prior echocardiogram. He has had progressive deterioration in his left ventricular systolic function with his ejection fraction decreasing from 50 to 55% in June 2019 to 30 to 35% now. I agree that aortic valve replacement is indicated in this patient to improve  his symptoms and prevent further left ventricular deterioration. His cardiac catheterization shows severe three-vessel coronary disease with a patent left internal mammary graft to the LAD. The right coronary artery is chronically occluded. There is a patent stent in the left main into the proximal left circumflex and first obtuse marginal branch. There is moderate restenosis in the stented segment but it does not appear to be flow-limiting and unchanged from his prior cath in 2019. The mean gradient measured across the aortic valve was 15 mmHg with an elevated LVEDP of 22 mmHg. I think TAVR would be the best option for this patient given his age, comorbid risk factors including prior coronary bypass surgery and extensive calcification of the ascending aorta. His gated cardiac CTA shows anatomy suitable for transcatheter aortic valve replacement using a Sapien 3 valve. His abdominal and pelvic CTA shows diffuse aortoiliac and femoral vascular disease with small external iliac artery lumens bilaterally. I do not think his pelvic vasculature is suitable for transfemoral insertion. He does have adequate sized subclavian arteries bilaterally to allow insertion. I think the left subclavian artery would give a more direct route into the ascending aorta since the innominate artery does take a fairly sharp turn after its origin. He has adequate sized common carotid arteries bilaterally but does have some evidence of internal carotid artery stenosis on the CT scan as well as on his carotid Doppler examinations.  The patient was counseled at length regarding treatment alternatives for management of severe symptomatic aortic stenosis. The risks and benefits of surgical intervention has been discussed in detail. Long-term prognosis with medical therapy was discussed. Alternative approaches such as conventional surgical aortic valve replacement, transcatheter aortic valve replacement, and palliative medical therapy were  compared  and contrasted at length. This discussion was placed in the context of the patient's own specific clinical presentation and past medical history. All of his questions have been addressed.  Following the decision to proceed with transcatheter aortic valve replacement, a discussion was held regarding what types of management strategies would be attempted intraoperatively in the event of life-threatening complications, including whether or not the patient would be considered a candidate for the use of cardiopulmonary bypass and/or conversion to open sternotomy for attempted surgical intervention. Since he has had previous coronary artery bypass graft surgery and has a diffusely calcified ascending aorta I do not think he is a candidate for emergent sternotomy to manage any intraoperative complications. The patient has been advised of a variety of complications that might develop including but not limited to risks of death, stroke, paravalvular leak, aortic dissection or other major vascular complications, aortic annulus rupture, device embolization, cardiac rupture or perforation, mitral regurgitation, acute myocardial infarction, arrhythmia, heart block or bradycardia requiring permanent pacemaker placement, congestive heart failure, respiratory failure, renal failure, pneumonia, infection, other late complications related to structural valve deterioration or migration, or other complications that might ultimately cause a temporary or permanent loss of functional independence or other long term morbidity. The patient provides full informed consent for the procedure as described and all questions were answered.    DETAILS OF THE OPERATIVE PROCEDURE  PREPARATION:    The patient is brought to the operating room on the above mentioned date and appropriate monitoring was established by the anesthesia team. The patient is placed in the supine position on the operating table.  Intravenous antibiotics are  administered.  General endotracheal anesthesia is induced uneventfully.  A Foley catheter is placed.  Baseline transesophageal echocardiogram was performed. The patient's chest, abdomen, and both groins are prepared and draped in a sterile manner. A time out procedure is performed.   PERIPHERAL ACCESS:    Using the modified Seldinger technique, femoral arterial and venous access was obtained with placement of 6 Fr sheaths on the right side.  A pigtail diagnostic catheter was passed through the right arterial sheath under fluoroscopic guidance into the aortic root.  A temporary transvenous pacemaker catheter was passed through the right femoral venous sheath under fluoroscopic guidance into the right ventricle.  The pacemaker was tested to ensure stable lead placement and pacemaker capture. Aortic root angiography was performed in order to determine the optimal angiographic angle for valve deployment.   LEFT SUBCLAVIAN ACCESS:   A transverse incision was made below the left clavicle and carried down through the subcutaneous tissue using electrocautery. The pectoralis major muscle was split along its fibers and the pectoralis minor muscle retracted laterally. The left axillary artery was identified and encircled with a vessel loop. The patient was heparinized systemically and ACT verified > 250 seconds.  A double concentric purse string suture of CV-4 gortex was placed in the anterior wall of the artery. The artery was cannulated with a needle and a J- wire advanced into the ascending aorta. An 8 F sheath was inserted over the wire. The aortic valve was crossed with a JR4 catheter and a straight wire. This was exchanged for a pigtail catheter and position was confirmed in the LV apex. Simultaneous LV and Ao pressures were recorded.  The pigtail catheter was exchanged for an Amplatz Extra-stiff wire in the LV apex.  Then a 20 F E-sheath was inserted into the axillary artery and the tip advanced into the  aortic arch.  BALLOON  AORTIC VALVULOPLASTY:   Not performed.  TRANSCATHETER HEART VALVE DEPLOYMENT:   An Edwards Sapien 3 Ultra transcatheter heart valve (size 26 mm, model #[id number], serial [id number]) was prepared and crimped per manufacturer's guidelines, and the proper orientation of the valve is confirmed on the [employer] delivery system. The valve was advanced through the introducer sheath using normal technique until in an appropriate position in the ascending aorta beyond the sheath tip. The balloon was then retracted and using the fine-tuning wheel was centered on the valve. The valve was then advanced across the aortic arch using appropriate flexion of the catheter. The valve was carefully positioned across the aortic valve annulus. The Commander catheter was retracted using normal technique. Once final position of the valve has been confirmed by angiographic assessment, the valve is deployed while temporarily holding ventilation and during rapid ventricular pacing to maintain systolic blood pressure < 50 mmHg and pulse pressure < 10 mmHg. The balloon inflation is held for >3 seconds after reaching full deployment volume. Once the balloon has fully deflated the balloon is retracted into the ascending aorta and valve function is assessed using echocardiography. There is felt to be no paravalvular leak and no central aortic insufficiency. Post-procedural gradients were acceptable. The patient's hemodynamic recovery following valve deployment is good.  The deployment balloon and guidewire are both removed.    PROCEDURE COMPLETION:   The sheath was removed from the left axillary artery and the sutures tied.  Protamine was administered. The temporary pacemaker, pigtail catheter and femoral sheaths were removed with manual pressure used for hemostasis.  A Mynx femoral closure device was utilized following removal of the diagnostic sheath in the right femoral artery.  The left axillary artery  site was hemostatic.  There was a good pulse distal to the insertion site.  The pectoralis muscle was approximated with continuous 2-0 Vicryl suture.  Subtenons tissue was closed with continuous 3-0 Vicryl suture.  Skin was closed with continuous 3-0 Vicryl subcuticular suture.  The patient tolerated the procedure well and is transported to the Cath Lab recovery area in stable condition. There were no immediate intraoperative complications. All sponge instrument and needle counts are verified correct at completion of the operation.   No blood products were administered during the operation.  The patient received a total of 60 mL of intravenous contrast during the procedure.   Gaye Pollack, MD 09/04/2019 3:48 PM

## 2019-09-04 NOTE — Anesthesia Procedure Notes (Signed)
Procedure Name: Intubation Date/Time: 09/04/2019 11:41 AM Performed by: Moshe Salisbury, CRNA Pre-anesthesia Checklist: Patient identified, Emergency Drugs available, Suction available and Patient being monitored Patient Re-evaluated:Patient Re-evaluated prior to induction Oxygen Delivery Method: Circle System Utilized Preoxygenation: Pre-oxygenation with 100% oxygen Induction Type: IV induction Ventilation: Mask ventilation without difficulty Laryngoscope Size: Mac and 4 Grade View: Grade II Tube type: Oral Tube size: 8.0 mm Number of attempts: 1 Airway Equipment and Method: Stylet Placement Confirmation: ETT inserted through vocal cords under direct vision,  positive ETCO2 and breath sounds checked- equal and bilateral Secured at: 22 cm Tube secured with: Tape Dental Injury: Teeth and Oropharynx as per pre-operative assessment

## 2019-09-04 NOTE — CV Procedure (Signed)
[facility]  TAVR OPERATIVE NOTE   Date of Procedure:  09/04/2019  Preoperative Diagnosis: Severe Aortic Stenosis   Postoperative Diagnosis: Same   Procedure:    TransCatheter Aortic Valve Replacement - Left Subclavian Approach  Edwards Sapien 3 THV (size 26 mm, model # [id number], serial # [id number])   Co-Surgeons:  Lauree Chandler, MD and Gaye Pollack, MD   Anesthesiologist:  Roanna Banning  Echocardiographer:  Meda Coffee  Pre-operative Echo Findings:  Severe aortic stenosis  Moderate left ventricular systolic dysfunction  Post-operative Echo Findings:  No paravalvular leak  Moderate left ventricular systolic dysfunction  BRIEF CLINICAL NOTE AND INDICATIONS FOR SURGERY  73 yo male with history of CAD s/p CABG in 1993 with subsequent PCI/stenting, HTN, HLD, aortic stenosis, COPD, cardiomyopathy and ongoing tobacco abuse here today for TAVR. He has severe CAD. He was admitted October 2013 and cath performed with occlusion of LAD, patent IMA to LAD, occluded proximal RCA with occluded SVG to PDA, severe disease in left main into Circumflex with occluded vein graft to Circumflex. Re-do CABG suggested but his aorta was severely calcified. He was seen by Dr. Servando Snare with CT surgery and not felt to be a candidate for re-do bypass. Imdur was added. No intervention performed. He was admitted October 2014 with a NSTEMI. Cardiac cath with 70% left main stenosis, LAD occluded, mid LAD 99 after anastomosis of the graft, prox Circumflex 50% , OM stent with 70% ISR, pRCA occluded, SVG-PDA occluded, SVG-OM occluded, LIMA-LAD patent. A drug eluting stent was placed in the mid LAD extending back into the IMA graft. He was readmitted May 2015 with an inferolateral STEMI and found to have progression of disease in the left main/proximal Circumflex stent. A drug eluting stent was placed from the Circumflex back into the left main. He was readmitted October 2015 with unstable angina.  Cardiac cath with stenosis left main stent and OM 1. A drug eluting stent was placed in the first OM branch. The left main stent restenosis was treated with balloon angioplasty. Repeat caths in April 2016 and June 2016 with stable disease. Despite all of his coronary interventions, he continued to smoke. He has been tried on Ranexa but stopped due to cost. Echo June 2019 with LVEF=50-55% with moderate aortic stenosis (mean gradient 20 mmHg) and moderate aortic insufficiency. Right and left heart cath at [facility] 04/04/18 showed severe 3V disease with patency of the stent in the IMA/LAD, stents left main, Circumflex and OM. The SVG to the RCA and SVG to the OM known to be occluded. Right heart pressures were normal. There was a mild gradient acros the aortic valve. He was seen by Dr. Melvyn Novas in 2016 and not felt to have COPD at that time. He was seen in October 2019 by Dr. Lake Bells in the pulmonary office and diagnosed with severe COPD.  I saw him September 2020 and he reported continued dyspnea at rest and with exertion. Echo 07/05/19 with LVEF=30-35%. Mild mitral regurgitation. The aortic valve leaflets are thickened and calcified. Mean gradient 24 mmHg, peak gradient 44.6 mmHg, AVA 1.08 cm2, dimensionless index 0.36. There is moderate to severe aortic insufficiency. Cardiac cath October 2020 with stable CAD.   During the course of the patient's preoperative work up they have been evaluated comprehensively by a multidisciplinary team of specialists coordinated through the [facility] in the [facility].  They have been demonstrated to suffer from symptomatic severe aortic stenosis as noted above. The patient  has been counseled extensively as to the relative risks and benefits of all options for the treatment of severe aortic stenosis including long term medical therapy, conventional surgery for aortic valve replacement, and transcatheter aortic valve replacement.  The  patient has been independently evaluated by Dr. Cyndia Bent with CT surgery and they are felt to be at high risk for conventional surgical aortic valve replacement. The surgeon indicated the patient would be a poor candidate for conventional surgery. Based upon review of all of the patient's preoperative diagnostic tests they are felt to be candidate for transcatheter aortic valve replacement using the transfemoral approach as an alternative to high risk conventional surgery.    Following the decision to proceed with transcatheter aortic valve replacement, a discussion has been held regarding what types of management strategies would be attempted intraoperatively in the event of life-threatening complications, including whether or not the patient would be considered a candidate for the use of cardiopulmonary bypass and/or conversion to open sternotomy for attempted surgical intervention.  The patient has been advised of a variety of complications that might develop peculiar to this approach including but not limited to risks of death, stroke, paravalvular leak, aortic dissection or other major vascular complications, aortic annulus rupture, device embolization, cardiac rupture or perforation, acute myocardial infarction, arrhythmia, heart block or bradycardia requiring permanent pacemaker placement, congestive heart failure, respiratory failure, renal failure, pneumonia, infection, other late complications related to structural valve deterioration or migration, or other complications that might ultimately cause a temporary or permanent loss of functional independence or other long term morbidity.  The patient provides full informed consent for the procedure as described and all questions were answered preoperatively.    DETAILS OF THE OPERATIVE PROCEDURE  PREPARATION:   The patient is brought to the operating room on the above mentioned date and central monitoring was established by the anesthesia team including  placement of a radial arterial line. The patient is placed in the supine position on the operating table.  Intravenous antibiotics are administered. General anesthesia is used.    Baseline transesophageal echocardiogram was performed. The patient's chest, abdomen, both groins, and both lower extremities are prepared and draped in a sterile manner. A time out procedure is performed.   PERIPHERAL ACCESS:   Using the modified Seldinger technique, femoral arterial and venous access were obtained with placement of 6 Fr sheaths on the right side using u/s guidance.  A pigtail diagnostic catheter was passed through the femoral arterial sheath under fluoroscopic guidance into the aortic root.  A temporary transvenous pacemaker catheter was passed through the femoral venous sheath under fluoroscopic guidance into the right ventricle.  The pacemaker was tested to ensure stable lead placement and pacemaker capture. Aortic root angiography was performed in order to determine the optimal angiographic angle for valve deployment.  TRANS-Subclavian artery access:   Please see Dr. Vivi Martens note for details of the surgical cut down to the left subclavian artery.   The patient was heparinized systemically and ACT verified > 250 seconds.    The artery was engaged with a needle and a J wire was then passed into the ascending aorta. The needle was removed and a 6 French sheath was passed over the wire into the left subclavian artery. An AL-2 catheter was then passed over the wire into the ascending aorta. A straight wire was used to cross the aortic valve. The AL-2 catheter was then used to cross the valve and a long J wire was advanced through this catheter  into the LV. The AL-2 catheter was exchanged for a pigtail catheter. Simultaneous LV and Ao pressures were recorded.  The pigtail catheter was then exchanged for an Amplatz Extra-stiff wire in the LV apex. The sheath was removed and a 31  [place] E-sheath was advanced  over the wire into the ascending aorta.   TRANSCATHETER HEART VALVE DEPLOYMENT:  An Edwards Sapien 3 THV (size 63mm) was prepared and crimped per manufacturer's guidelines, and the proper orientation of the valve is confirmed on the [employer] delivery system. The valve was advanced through the introducer sheath using normal technique until in an appropriate position in the ascending aorta. The balloon was then retracted and using the fine-tuning wheel was centered on the valve. The valve was then advanced across the aortic valve annulus. The Commander catheter was retracted using normal technique. Once final position of the valve has been confirmed by angiographic assessment, the valve is deployed while temporarily holding ventilation and during rapid ventricular pacing to maintain systolic blood pressure < 50 mmHg and pulse pressure < 10 mmHg. The balloon inflation is held for >3 seconds after reaching full deployment volume. Once the balloon has fully deflated the balloon is retracted into the ascending aorta and valve function is assessed using TEE. There is felt to be no paravalvular leak and no central aortic insufficiency.  The patient's hemodynamic recovery following valve deployment is good.  The deployment balloon and guidewire are both removed. Echo demostrated acceptable post-procedural gradients, stable mitral valve function, and no AI.   PROCEDURE COMPLETION:  The sheath was then removed and the arteriotomy was closed by Dr. Cyndia Bent. Please see his operative note for details of the surgical closure. Protamine was administered once arterial repair was complete. The temporary pacemaker, pigtail catheters and femoral sheaths were removed with a Mynx closure device placed in the right femoral artery and manual pressure used for venous hemostasis.    The patient tolerated the procedure well and is transported to the surgical intensive care in stable condition. There were no immediate  intraoperative complications. All sponge instrument and needle counts are verified correct at completion of the operation.   No blood products were administered during the operation.  The patient received a total of 60 mL of intravenous contrast during the procedure.  Lauree Chandler MD 09/04/2019 2:05 PM

## 2019-09-04 NOTE — Anesthesia Postprocedure Evaluation (Signed)
Anesthesia Post Note  Patient: Steve Durham  Procedure(s) Performed: TRANSCATHETER AORTIC VALVE REPLACEMENT, LEFT SUBCLAVIAN (Left Chest) TRANSESOPHAGEAL ECHOCARDIOGRAM (TEE) (N/A ) Ultrasound Guidance For Vascular Access (Right Groin)     Patient location during evaluation: PACU Anesthesia Type: General Level of consciousness: awake and alert Pain management: pain level controlled Vital Signs Assessment: post-procedure vital signs reviewed and stable Respiratory status: spontaneous breathing, nonlabored ventilation, respiratory function stable and patient connected to nasal cannula oxygen Cardiovascular status: blood pressure returned to baseline and stable Postop Assessment: no apparent nausea or vomiting Anesthetic complications: no    Last Vitals:  Vitals:   09/04/19 1515 09/04/19 1530  BP: (!) 115/50 (!) 127/58  Pulse: (!) 48 (!) 49  Resp: 16 20  Temp:    SpO2: 90% 98%    Last Pain:  Vitals:   09/04/19 1420  TempSrc: Temporal  PainSc:                  Ryan P Ellender

## 2019-09-04 NOTE — Transfer of Care (Signed)
Immediate Anesthesia Transfer of Care Note  Patient: Steve Durham  Procedure(s) Performed: TRANSCATHETER AORTIC VALVE REPLACEMENT, LEFT SUBCLAVIAN (Left Chest) TRANSESOPHAGEAL ECHOCARDIOGRAM (TEE) (N/A ) Ultrasound Guidance For Vascular Access (Right Groin)  Patient Location: Cath Lab  Anesthesia Type:General  Level of Consciousness: drowsy and patient cooperative  Airway & Oxygen Therapy: Patient Spontanous Breathing and Patient connected to nasal cannula oxygen  Post-op Assessment: Report given to RN, Post -op Vital signs reviewed and stable and Patient moving all extremities  Post vital signs: Reviewed and stable  Last Vitals:  Vitals Value Taken Time  BP 116/48 09/04/19 1358  Temp 36.1 C 09/04/19 1352  Pulse 45 09/04/19 1401  Resp 16 09/04/19 1401  SpO2 96 % 09/04/19 1401  Vitals shown include unvalidated device data.  Last Pain:  Vitals:   09/04/19 1352  TempSrc: Temporal  PainSc: Asleep      Patients Stated Pain Goal: 3 ([id number])  Complications: No apparent anesthesia complications

## 2019-09-04 NOTE — Progress Notes (Signed)
09/04/2019 1830 Pt ambulated without assistance and on RA 392ft without any difficulty. [place]

## 2019-09-04 NOTE — Anesthesia Procedure Notes (Signed)
Arterial Line Insertion Start/End11/17/2020 11:10 AM, 09/04/2019 11:20 AM Performed by: Moshe Salisbury, CRNA, CRNA  Patient location: Pre-op. Preanesthetic checklist: patient identified, IV checked, site marked, risks and benefits discussed, surgical consent, monitors and equipment checked, pre-op evaluation, timeout performed and anesthesia consent Lidocaine 1% used for infiltration and patient sedated Right, radial was placed Catheter size: 20 G Hand hygiene performed , maximum sterile barriers used  and Seldinger technique used  Attempts: 1 Procedure performed without using ultrasound guided technique. Following insertion, dressing applied and Biopatch. Post procedure assessment: normal  Patient tolerated the procedure well with no immediate complications.

## 2019-09-04 NOTE — Progress Notes (Signed)
Patient ID: Steve Durham, male   DOB: 08/25/1946, 73 y.o.   MRN: [id number] TCTS postop check:  He is hemodynamically stable in sinus rhythm.  Still a little sleepy but follows commands and appears neuro intact.  Incision looks good. Groin site ok  CXR and ECG pending  BMET    Component Value Date/Time   NA 139 09/04/2019 1406   NA 138 07/27/2019 0935   K 4.6 09/04/2019 1406   CL 105 09/04/2019 1406   CO2 20 (L) 08/31/2019 1100   GLUCOSE 112 (H) 09/04/2019 1406   BUN 23 09/04/2019 1406   BUN 19 07/27/2019 0935   CREATININE 1.20 09/04/2019 1406   CREATININE 1.27 (H) 04/27/2016 1516   CALCIUM 9.2 08/31/2019 1100   GFRNONAA 58 (L) 08/31/2019 1100   GFRAA >60 08/31/2019 1100   CBC    Component Value Date/Time   WBC 10.7 (H) 08/31/2019 1100   RBC 4.67 08/31/2019 1100   HGB 12.9 (L) 09/04/2019 1406   HGB 14.7 07/27/2019 0935   HCT 38.0 (L) 09/04/2019 1406   HCT 43.7 07/27/2019 0935   PLT 211 08/31/2019 1100   PLT 215 07/27/2019 0935   MCV 99.6 08/31/2019 1100   MCV 96 07/27/2019 0935   MCH 32.5 08/31/2019 1100   MCHC 32.7 08/31/2019 1100   RDW 12.9 08/31/2019 1100   RDW 11.7 07/27/2019 0935   LYMPHSABS 1.7 06/05/2018 1720   MONOABS 1.2 (H) 06/05/2018 1720   EOSABS 0.2 06/05/2018 1720   BASOSABS 0.1 06/05/2018 1720

## 2019-09-05 ENCOUNTER — Encounter (HOSPITAL_COMMUNITY): Payer: Self-pay | Admitting: Cardiovascular Disease

## 2019-09-05 ENCOUNTER — Other Ambulatory Visit: Payer: Self-pay

## 2019-09-05 ENCOUNTER — Inpatient Hospital Stay (HOSPITAL_COMMUNITY): Payer: Medicare Other

## 2019-09-05 DIAGNOSIS — Z952 Presence of prosthetic heart valve: Secondary | ICD-10-CM

## 2019-09-05 DIAGNOSIS — I350 Nonrheumatic aortic (valve) stenosis: Principal | ICD-10-CM

## 2019-09-05 LAB — BASIC METABOLIC PANEL
Anion gap: 9 (ref 5–15)
BUN: 20 mg/dL (ref 8–23)
CO2: 21 mmol/L — ABNORMAL LOW (ref 22–32)
Calcium: 8.6 mg/dL — ABNORMAL LOW (ref 8.9–10.3)
Chloride: 106 mmol/L (ref 98–111)
Creatinine, Ser: 1.18 mg/dL (ref 0.61–1.24)
GFR calc Af Amer: >60 mL/min (ref >60)
GFR calc non Af Amer: >60 mL/min (ref >60)
Glucose, Bld: 171 mg/dL — ABNORMAL HIGH (ref 70–99)
Potassium: 3.9 mmol/L (ref 3.5–5.1)
Sodium: 136 mmol/L (ref 135–145)

## 2019-09-05 LAB — CBC
HCT: 40 % (ref 39.0–52.0)
Hemoglobin: 13.3 g/dL (ref 13.0–17.0)
MCH: 32.8 pg (ref 26.0–34.0)
MCHC: 33.3 g/dL (ref 30.0–36.0)
MCV: 98.5 fL (ref 80.0–100.0)
Platelets: 152 10*3/uL (ref 150–400)
RBC: 4.06 MIL/uL — ABNORMAL LOW (ref 4.22–5.81)
RDW: 12.5 % (ref 11.5–15.5)
WBC: 13.5 10*3/uL — ABNORMAL HIGH (ref 4.0–10.5)
nRBC: 0 % (ref 0.0–0.2)

## 2019-09-05 LAB — MAGNESIUM: Magnesium: 1.9 mg/dL (ref 1.7–2.4)

## 2019-09-05 MED ORDER — ASPIRIN 81 MG PO CHEW: 81.0000 mg | CHEWABLE_TABLET | Freq: Every day | ORAL | 3 refills | Status: DC

## 2019-09-05 NOTE — Progress Notes (Signed)
Progress Note  Patient Name: Steve Durham Date of Encounter: 09/05/2019  Primary Cardiologist: Lauree Chandler, MD   Subjective   No events overnight. No chest pain. Dyspnea improved.   Inpatient Medications    Scheduled Meds: . amLODipine  5 mg Oral Daily  . aspirin  81 mg Oral Daily  . atorvastatin  80 mg Oral QHS  . bisoprolol  2.5 mg Oral BID  . budesonide  0.25 mg Nebulization BID  . clopidogrel  75 mg Oral Daily  . furosemide  40 mg Oral BID  . isosorbide mononitrate  60 mg Oral BID  . loratadine  10 mg Oral Daily  . pantoprazole  40 mg Oral Daily  . sodium chloride flush  3 mL Intravenous Q12H  . spironolactone  12.5 mg Oral BID   Continuous Infusions: . sodium chloride    . cefUROXime (ZINACEF)  IV 1.5 g (09/05/19 0536)  . phenylephrine (NEO-SYNEPHRINE) Adult infusion     PRN Meds: sodium chloride, acetaminophen **OR** acetaminophen, metoprolol tartrate, morphine injection, nitroGLYCERIN, ondansetron (ZOFRAN) IV, oxyCODONE, sodium chloride flush, traMADol   Vital Signs    Vitals:   09/04/19 2100 09/04/19 2156 09/04/19 2316 09/05/19 0509  BP: 133/60 (!) 147/71 (!) 143/69 139/67  Pulse: (!) 58 63 (!) 57 60  Resp: 15 18 17 20   Temp:   97.6 F (36.4 C) 98 F (36.7 C)  TempSrc:   Oral Oral  SpO2: 94% 98% 98% 98%  Weight:    64.2 kg  Height:        Intake/Output Summary (Last 24 hours) at 09/05/2019 0636 Last data filed at 09/05/2019 0536 Gross per 24 hour  Intake 850 ml  Output 350 ml  Net 500 ml   Last 3 Weights 09/05/2019 09/04/2019 08/31/2019  Weight (lbs) 141 lb 8.6 oz 141 lb 8 oz 141 lb 8 oz  Weight (kg) 64.2 kg 64.184 kg 64.184 kg      Telemetry    Sinus brady - Personally Reviewed  ECG    Sinus 1st degree AV block. NS T wave abn, unchanged- Personally Reviewed  Physical Exam   GEN: No acute distress.   Neck: No JVD Cardiac: RRR, no murmurs, rubs, or gallops.  Respiratory: Clear to auscultation bilaterally. GI: Soft,  nontender, non-distended  MS: No edema; No deformity. Neuro:  Nonfocal  Psych: Normal affect   Labs    High Sensitivity Troponin:  No results for input(s): TROPONINIHS in the last 720 hours.    Chemistry Recent Labs  Lab 08/31/19 1100  09/04/19 1327 09/04/19 1406 09/05/19 0311  NA 140   < > 137 139 136  K 4.3   < > 4.4 4.6 3.9  CL 104   < > 104 105 106  CO2 20*  --   --   --  21*  GLUCOSE 102*   < > 147* 112* 171*  BUN 17   < > 23 23 20   CREATININE 1.22   < > 1.20 1.20 1.18  CALCIUM 9.2  --   --   --  8.6*  PROT 7.0  --   --   --   --   ALBUMIN 3.8  --   --   --   --   AST 27  --   --   --   --   ALT 25  --   --   --   --   ALKPHOS 64  --   --   --   --  BILITOT 1.5*  --   --   --   --   GFRNONAA 58*  --   --   --  >60  GFRAA >60  --   --   --  >60  ANIONGAP 16*  --   --   --  9   < > = values in this interval not displayed.     Hematology Recent Labs  Lab 08/31/19 1100  09/04/19 1327 09/04/19 1406 09/05/19 0311  WBC 10.7*  --   --   --  13.5*  RBC 4.67  --   --   --  4.06*  HGB 15.2   < > 13.6 12.9* 13.3  HCT 46.5   < > 40.0 38.0* 40.0  MCV 99.6  --   --   --  98.5  MCH 32.5  --   --   --  32.8  MCHC 32.7  --   --   --  33.3  RDW 12.9  --   --   --  12.5  PLT 211  --   --   --  152   < > = values in this interval not displayed.    BNP Recent Labs  Lab 08/31/19 1100  BNP 839.8*     DDimer No results for input(s): DDIMER in the last 168 hours.   Radiology    Dg Chest Port 1 View  Result Date: 09/04/2019 CLINICAL DATA:  Status post TAVR, postoperative day 0 EXAM: PORTABLE CHEST 1 VIEW COMPARISON:  08/31/2019 FINDINGS: Prior CABG. Aortic valve prosthesis and expected orientation. Atherosclerotic calcification of the aortic arch. No new cardiomegaly or edema. No blunting of the costophrenic angles. Stable faint interstitial accentuation most notable in the lung bases. Emphysema is present. Thoracic spondylosis noted. IMPRESSION: 1. Interval aortic  valve prosthesis placement, expected orientation. No new cardiomegaly or edema. 2.  Emphysema (ICD10-J43.9). 3.  Aortic Atherosclerosis (ICD10-I70.0). 4. Thoracic spondylosis Electronically Signed   By: Van Clines M.D.   On: 09/04/2019 17:59    Cardiac Studies     Patient Profile     73 y.o. male with h/o CAD, PAD, cardiomyopathy, tobacco abuse, severe COPD and severe aortic stenosis who was admitted following TAVR on 09/04/19.   Assessment & Plan    1. Severe aortic valve stenosis: He is doing well one day post TAVR from the left axillary/subclavian artery approach with placement of a 26 mm Edwards Sapien Ultra valve. Labs reviewed this am. BP is stable. Sinus brady. Echo pending today. Will plan to d/c home today. Will continue ASA and Plavix. Follow up 09/19/19 with Nell Range, PA-C.(appt has been arranged).  No heavy lifting for 6 weeks.   For questions or updates, please contact [place] Please consult [email or web address] for contact info under        Signed, Lauree Chandler, MD  09/05/2019, 6:36 AM

## 2019-09-05 NOTE — Progress Notes (Signed)
  Echocardiogram 2D Echocardiogram has been performed.  Steve Durham 09/05/2019, 10:19 AM

## 2019-09-05 NOTE — Progress Notes (Signed)
Pt ambulated 800 feet without any assistive device. tolerated well. Right groin site level zero, bilateral pedal pulses dopplered. Right groin dressing removed and band aid applied. Will continue to monitor.

## 2019-09-05 NOTE — Progress Notes (Signed)
Pt has ambulated and declined walking now. Discussed restrictions, walking at home, smoking cessation, and [facility]. Pt is not ready to quit smoking but resources given. He sts he will call [facility] if he wants to do program. Eager to get home to his yard. [id number] [place], ACSM 11:05 AM 09/05/2019

## 2019-09-05 NOTE — Discharge Summary (Signed)
Discharge Summary    Patient ID: Steve Durham MRN: [id number]; DOB: [birth year]  Admit date: 09/04/2019 Discharge date: 09/05/2019  Primary Care Provider: Townsend Roger, MD  Primary Cardiologist: Lauree Chandler, MD  Primary Electrophysiologist:  None   Discharge Diagnoses    Principal Problem:   Severe aortic stenosis Active Problems:   Essential hypertension   CABG '98, cath Oct 2014- LAD DES placed.   Hyperlipidemia   Chronic systolic CHF (congestive heart failure) ([place])    Diagnostic Studies/Procedures    TEE 09/04/2019: POST-OP IMPRESSIONS - Left Ventricle: has moderately reduced systolic function. The cavity size was moderately dilated. - Aortic Valve: A Sapien bioprosthetic valve was placed, leaflets are not freely mobile Manufactured by; an Edwards Size; 26. There is no regurgitation. No regurgitation post repair. The gradient recorded across the prosthetic valve is within the expected range with peak/mean 7/2 mmHg. No perivalvular leak noted. - Mitral Valve: There is no regurgitation. No regurgitation post repair. The gradient recorded across the prosthetic valve is within the expected range. - Tricuspid Valve: There is no regurgitation. No regurgitation post repair. The gradient recorded across the prosthetic valve is within the expected range.  PRE-OP FINDINGS  Left Ventricle: The left ventricle has moderate-severely reduced systolic function, with an ejection fraction of 30-35%. The cavity size was moderately dilated. There is no increase in left ventricular wall thickness.  Right Ventricle: The right ventricle has normal systolic function. The cavity was normal. There is no increase in right ventricular wall thickness.  Left Atrium: Left atrial size was dilated.  Right Atrium: Right atrial size was normal in size. Right atrial pressure is estimated at 10 mmHg.  Interatrial Septum: No atrial level shunt detected by color flow  Doppler.  Pericardium: There is no evidence of pericardial effusion.  Mitral Valve: The mitral valve is normal in structure. Mitral valve regurgitation is mild by color flow Doppler.  Tricuspid Valve: The tricuspid valve was normal in structure. Tricuspid valve regurgitation is mild by color flow Doppler.  Aortic Valve: The aortic valve is tricuspid There is Severely thickening of the aortic valve and There is Severe calcifcation of the aortic valve Aortic valve regurgitation is moderate by color flow Doppler. There is severe stenosis of the aortic valve.  Pulmonic Valve: The pulmonic valve was normal in structure, with normal. The gradient recorded across the pulmonic valve is within the expected range. The gradient recorded across the prosthetic pulmonic valve is within the expected range. Pulmonic valve regurgitation was not assessed by color flow Doppler.   Venous: The inferior vena cava is normal in size with greater than 50% respiratory variability, suggesting right atrial pressure of 3 mmHg.  _____________   History of Present Illness     Steve Durham is a 73 yo male with history of CAD s/p CABG in 1993 with subsequent PCI/stenting, HTN, HLD, aortic stenosis, COPD, cardiomyopathy and ongoing tobacco abuse. He was recently evaluated outpatient for progressive exertional fatigue, SOB, and chest tightness different than previous angina. Echo 06/2019 showed drop in EF to 30-35% with moderate to severe AS. He was deemed to be a good candidate for TAVR, which he presented for on 09/04/2019.   Hospital Course     Consultants: multidisciplinary team including TCTS and the structural heart team   1. Severe aortic stenosis: patient presented for TAVR which occurred 09/04/2019 by Dr's Cyndia Bent and McAlhany. He underwent TAVR from the left axillary/subclavian artery approach with placement of a 26 mm Edwards Sapien Ultra  valve. There were no immediate complications following his  procedure. Echocardiogram post procedure was stable.  - Continue aspirin and plavix - Scheduled to see Nell Range, PA-C 09/19/2019 - Anticipate echo in 1 month for close monitoring - Activity limitation instructions were provided in the AVS.     Did the patient have an acute coronary syndrome (MI, NSTEMI, STEMI, etc) this admission?:  No                               Did the patient have a percutaneous coronary intervention (stent / angioplasty)?:  No.   _____________  Discharge Vitals Blood pressure (!) 149/71, pulse (!) 55, temperature 97.8 F (36.6 C), temperature source Oral, resp. rate 16, height 5\' 5"  (1.651 m), weight 64.2 kg, SpO2 98 %.  Filed Weights   09/04/19 0905 09/05/19 0509  Weight: 64.2 kg 64.2 kg    Labs & Radiologic Studies    CBC Recent Labs    09/04/19 1406 09/05/19 0311  WBC  --  13.5*  HGB 12.9* 13.3  HCT 38.0* 40.0  MCV  --  98.5  PLT  --  0000000   Basic Metabolic Panel Recent Labs    09/04/19 1406 09/05/19 0311  NA 139 136  K 4.6 3.9  CL 105 106  CO2  --  21*  GLUCOSE 112* 171*  BUN 23 20  CREATININE 1.20 1.18  CALCIUM  --  8.6*  MG  --  1.9   Liver Function Tests No results for input(s): AST, ALT, ALKPHOS, BILITOT, PROT, ALBUMIN in the last 72 hours. No results for input(s): LIPASE, AMYLASE in the last 72 hours. High Sensitivity Troponin:   No results for input(s): TROPONINIHS in the last 720 hours.  BNP Invalid input(s): POCBNP D-Dimer No results for input(s): DDIMER in the last 72 hours. Hemoglobin A1C No results for input(s): HGBA1C in the last 72 hours. Fasting Lipid Panel No results for input(s): CHOL, HDL, LDLCALC, TRIG, CHOLHDL, LDLDIRECT in the last 72 hours. Thyroid Function Tests No results for input(s): TSH, T4TOTAL, T3FREE, THYROIDAB in the last 72 hours.  Invalid input(s): FREET3 _____________  Dg Chest 2 View  Result Date: 08/31/2019 CLINICAL DATA:  Preop.  Cough and shortness of breath. EXAM: CHEST - 2 VIEW  COMPARISON:  CT chest 08/21/2019 and chest radiograph 12/18/2018. FINDINGS: Trachea is midline. Heart size normal. Thoracic aorta is calcified. Lungs are hyperinflated but clear. No pleural fluid. IMPRESSION: Hyperinflation without acute finding. Electronically Signed   By: Lorin Picket M.D.   On: 08/31/2019 13:23   Ct Coronary Morph W/cta Cor W/score W/ca W/cm &/or Wo/cm  Addendum Date: 08/21/2019   ADDENDUM REPORT: 08/21/2019 22:54 CLINICAL DATA:  73 year old male with severe aortic stenosis being evaluated for a TAVR procedure. EXAM: Cardiac TAVR CT TECHNIQUE: The patient was scanned on a [employer]. A 120 kV retrospective scan was triggered in the descending thoracic aorta at 111 HU's. Gantry rotation speed was 250 msecs and collimation was .6 mm. No beta blockade or nitro were given. The 3D data set was reconstructed in 5% intervals of the R-R cycle. Systolic and diastolic phases were analyzed on a dedicated work station using MPR, MIP and VRT modes. The patient received 80 cc of contrast. FINDINGS: Aortic Valve: Trileaflet aortic valve with severely thickened and calcified leaflets, severely restricted leaflets opening and no calcifications extending into LVOT. Aorta: Normal size with moderate to severe atherosclerotic plaque and  calcifications and no dissection. Sinotubular Junction: 31 x 29 mm Ascending Thoracic Aorta: 36 x 33 mm Aortic Arch: 28 x 28 mm Descending Thoracic Aorta: 24 x 24 mm Sinus of Valsalva Measurements: Non-coronary: 32 mm Right -coronary: 31 mm Left -coronary: 31 mm Coronary Artery Height above Annulus: Left Main: 16 mm Right Coronary: 19 mm Virtual Basal Annulus Measurements: Maximum/Minimum Diameter: 28.1 x 23.4 mm Mean Diameter: 24.3 mm Perimeter: 78.1 mm Area: 463 mm2 Optimum Fluoroscopic Angle for Delivery: LAO 0 CAU 0 IMPRESSION: 1. Trileaflet aortic valve with severely thickened and calcified leaflets, severely restricted leaflets opening and no calcifications  extending into LVOT. Aortic valve calcium score 1936 (> 2065 severe for men). Annular measurements suitable for delivery of a 26 mm Edwards-SAPIEN 3 Ultra THV. 2. Sufficient coronary to annulus distance. 3. Optimum Fluoroscopic Angle for Delivery: LAO 0 CAU 0. 4. No thrombus in the left atrial appendage. Electronically Signed   By: Ena Dawley   On: 08/21/2019 22:54   Result Date: 08/21/2019 EXAM: OVER-READ INTERPRETATION  CT CHEST The following report is an over-read performed by radiologist Dr. Salvatore Marvel of [facility], [place] on 08/21/2019. This over-read does not include interpretation of cardiac or coronary anatomy or pathology. The coronary CTA interpretation by the cardiologist is attached. COMPARISON:  07/31/2013 chest CT angiogram. FINDINGS: Please see the separate concurrent chest CT angiogram report for details. IMPRESSION: Please see the separate concurrent chest CT angiogram report for details. Electronically Signed: By: Ilona Sorrel M.D. On: 08/21/2019 11:03   Dg Chest Port 1 View  Result Date: 09/04/2019 CLINICAL DATA:  Status post TAVR, postoperative day 0 EXAM: PORTABLE CHEST 1 VIEW COMPARISON:  08/31/2019 FINDINGS: Prior CABG. Aortic valve prosthesis and expected orientation. Atherosclerotic calcification of the aortic arch. No new cardiomegaly or edema. No blunting of the costophrenic angles. Stable faint interstitial accentuation most notable in the lung bases. Emphysema is present. Thoracic spondylosis noted. IMPRESSION: 1. Interval aortic valve prosthesis placement, expected orientation. No new cardiomegaly or edema. 2.  Emphysema (ICD10-J43.9). 3.  Aortic Atherosclerosis (ICD10-I70.0). 4. Thoracic spondylosis Electronically Signed   By: Van Clines M.D.   On: 09/04/2019 17:59   Ct Angio Chest Aorta W &/or Wo Contrast  Result Date: 08/21/2019 CLINICAL DATA:  Severe symptomatic aortic stenosis. Pre-TAVR evaluation. EXAM: CT ANGIOGRAPHY CHEST, ABDOMEN AND PELVIS  TECHNIQUE: Multidetector CT imaging through the chest, abdomen and pelvis was performed using the standard protocol during bolus administration of intravenous contrast. Multiplanar reconstructed images and MIPs were obtained and reviewed to evaluate the vascular anatomy. CONTRAST:  122mL OMNIPAQUE IOHEXOL 350 MG/ML SOLN COMPARISON:  02/02/2017 chest CT angiogram. 06/06/2018 CT abdomen/pelvis. FINDINGS: CTA CHEST FINDINGS Cardiovascular: Mild cardiomegaly. Diffuse thickening and coarse calcification of the aortic valve. No significant pericardial effusion/thickening. Left main and 3 vessel coronary atherosclerosis status post CABG. Atherosclerotic nonaneurysmal thoracic aorta, including severe atherosclerotic calcification of the ascending thoracic aorta. Normal caliber pulmonary arteries. No central pulmonary emboli. Mediastinum/Nodes: No discrete thyroid nodules. Unremarkable esophagus. No pathologically enlarged axillary, mediastinal or hilar lymph nodes. Lungs/Pleura: No pneumothorax. No pleural effusion. Mild centrilobular and paraseptal emphysema with diffuse bronchial wall thickening. No acute consolidative airspace disease or lung masses. No significant pulmonary nodules. Musculoskeletal: No aggressive appearing focal osseous lesions. Intact sternotomy wires. Mild thoracic spondylosis. CTA ABDOMEN AND PELVIS FINDINGS Hepatobiliary: Normal liver size. No liver masses. Stable granulomatous inferior right liver calcification. Cholelithiasis. No biliary ductal dilatation. Pancreas: Normal, with no mass or duct dilation. Spleen: Normal size. No mass. Adrenals/Urinary Tract:  Stable diffuse bilateral adrenal thickening without discrete adrenal nodules, suggesting adrenal hyperplasia. No hydronephrosis. Several indeterminate renal cortical lesions in both kidneys, largest in the right kidney measuring 2.8 cm in the anterior upper right kidney with density 49 HU (series 14/image 371), increased from 2.4 cm on  06/05/2018 CT, and largest in the left kidney measuring 1.7 cm in the interpolar left kidney with density 83 HU (series 14/image 381), increased from 1.4 cm. Several subcentimeter hypodense renal cortical lesions in both kidneys are too small to characterize. Stable septated 5.7 cm Bosniak category 2 renal cyst in the lateral upper right kidney. Stable chronic mild diffuse bladder wall thickening without significant bladder distention. Stomach/Bowel: Small hiatal hernia. Otherwise normal nondistended stomach normal caliber small bowel with no small bowel wall thickening. Normal appendix. Marked sigmoid diverticulosis, with no large bowel wall thickening or significant pericolonic fat stranding. Vascular/Lymphatic: Atherosclerotic abdominal aorta with stable 3.2 cm infrarenal abdominal aortic aneurysm. Stable aneurysmal bilateral common iliac arteries measuring 2.8 cm diameter on the left and 1.9 cm diameter on the right. No pathologically enlarged lymph nodes in the abdomen or pelvis. Reproductive: Mild prostatomegaly. Other: No pneumoperitoneum, ascites or focal fluid collection. Musculoskeletal: No aggressive appearing focal osseous lesions. Moderate lumbar spondylosis. VASCULAR MEASUREMENTS PERTINENT TO TAVR: AORTA: Minimal Aortic Diameter-12.4 x 11.2 mm Severity of Aortic Calcification-severe RIGHT PELVIS: Right Common Iliac Artery - Minimal Diameter-5.6 x 5.5 mm Tortuosity-mild Calcification-severe Right External Iliac Artery - Minimal Diameter-4.3 x 4.3 mm Tortuosity-mild-to-moderate Calcification-moderate to severe Right Common Femoral Artery - Minimal Diameter-4.4 x 4.2 mm Tortuosity-mild Calcification-severe LEFT PELVIS: Left Common Iliac Artery - Minimal Diameter-9.7 x 9.1 mm Tortuosity-mild Calcification-severe Left External Iliac Artery - Minimal Diameter-3.8 x 2.8 mm Tortuosity-mild-to-moderate Calcification-severe Left Common Femoral Artery - Minimal Diameter-6.0 x 4.1 mm Tortuosity-mild  Calcification-severe Review of the MIP images confirms the above findings. IMPRESSION: 1. Vascular findings and measurements pertinent to potential TAVR procedure, as detailed. Note is made of diminutive external iliac arteries bilaterally and diffuse severe atherosclerotic calcification including along the ascending thoracic aorta. 2. Marked thickening and calcification of the aortic valve, compatible with the reported history of symptomatic severe aortic stenosis. 3. Mild cardiomegaly. 4. Several indeterminate renal cortical lesions in both kidneys, largest 2.8 cm in the anterior upper right kidney, mildly increased in size. MRI (preferred) or CT abdomen without and with IV contrast recommended for further characterization, to exclude renal cell carcinoma. 5. Infrarenal 3.2 cm Abdominal Aortic Aneurysm (ICD10-I71.9). Recommend follow-up aortic ultrasound in 3 years. This recommendation follows ACR consensus guidelines: White Paper of the ACR Incidental Findings Committee II on Vascular Findings. J Am Coll Radiol 2013; 10:789-794. 6. Bilateral common iliac artery aneurysms, stable, 2.8 cm on the left and 1.9 cm on the right. 7. Chronic findings include: Aortic Atherosclerosis (ICD10-I70.0) and Emphysema (ICD10-J43.9). Cholelithiasis. Small hiatal hernia. Marked sigmoid diverticulosis. Mild prostatomegaly. Electronically Signed   By: Ilona Sorrel M.D.   On: 08/21/2019 11:53   Vas US Carotid  Result Date: 08/21/2019 Carotid Arterial Duplex Study Indications:       Pre TAVR. Comparison Study:  no prior Performing Technologist: June Leap RDMS, RVT  Examination Guidelines: A complete evaluation includes B-mode imaging, spectral Doppler, color Doppler, and power Doppler as needed of all accessible portions of each vessel. Bilateral testing is considered an integral part of a complete examination. Limited examinations for reoccurring indications may be performed as noted.  Right Carotid Findings:  +----------+--------+--------+--------+--------------------------+--------+  PSV cm/s EDV cm/s Stenosis Plaque Description         Comments  +----------+--------+--------+--------+--------------------------+--------+  CCA Prox   102      8                                                      +----------+--------+--------+--------+--------------------------+--------+  CCA Distal 44       9                                                      +----------+--------+--------+--------+--------------------------+--------+  ICA Prox   111      33       1-39%    heterogenous and irregular           +----------+--------+--------+--------+--------------------------+--------+  ICA Distal 101      13                                                     +----------+--------+--------+--------+--------------------------+--------+  ECA        108      8                 heterogenous and irregular           +----------+--------+--------+--------+--------------------------+--------+ +----------+--------+-------+----------------+-------------------+             PSV cm/s EDV cms Describe         Arm Pressure (mmHG)  +----------+--------+-------+----------------+-------------------+  Subclavian 114              Multiphasic, WNL                      +----------+--------+-------+----------------+-------------------+ +---------+--------+--------+------+  Vertebral PSV cm/s EDV cm/s Absent  +---------+--------+--------+------+  Left Carotid Findings: +----------+--------+--------+--------+--------------------------+--------+             PSV cm/s EDV cm/s Stenosis Plaque Description         Comments  +----------+--------+--------+--------+--------------------------+--------+  CCA Prox   93       6                                                      +----------+--------+--------+--------+--------------------------+--------+  CCA Distal 25       7                                                       +----------+--------+--------+--------+--------------------------+--------+  ICA Prox   139      49       40-59%   heterogenous and irregular           +----------+--------+--------+--------+--------------------------+--------+  ICA Distal 75       23                                                     +----------+--------+--------+--------+--------------------------+--------+  ECA        135      11                heterogenous and irregular           +----------+--------+--------+--------+--------------------------+--------+ +----------+--------+--------+----------------+-------------------+             PSV cm/s EDV cm/s Describe         Arm Pressure (mmHG)  +----------+--------+--------+----------------+-------------------+  Subclavian 125               Multiphasic, WNL                      +----------+--------+--------+----------------+-------------------+ +---------+--------+--+--------+--+---------+  Vertebral PSV cm/s 59 EDV cm/s 11 Antegrade  +---------+--------+--+--------+--+---------+  Summary: Right Carotid: Velocities in the right ICA are consistent with a 1-39% stenosis.                Abnormal waveforms due to severe aortic stenosis may obscure                velocities being in higher range. Based on plaque formation,                stenosis appears to be greater. Left Carotid: Velocities in the left ICA are consistent with a 40-59% stenosis.               Abnormal waveforms due to severe aortic stenosis may obscure               velocities being in higher range. Based on plaque formation,               stenosis appears to be greater.  *See table(s) above for measurements and observations.  Electronically signed by Ruta Hinds MD on 08/21/2019 at 9:31:28 AM.    Final    Ct Angio Abd/pel W/ And/or W/o  Result Date: 08/21/2019 CLINICAL DATA:  Severe symptomatic aortic stenosis. Pre-TAVR evaluation. EXAM: CT ANGIOGRAPHY CHEST, ABDOMEN AND PELVIS TECHNIQUE: Multidetector CT imaging through the  chest, abdomen and pelvis was performed using the standard protocol during bolus administration of intravenous contrast. Multiplanar reconstructed images and MIPs were obtained and reviewed to evaluate the vascular anatomy. CONTRAST:  126mL OMNIPAQUE IOHEXOL 350 MG/ML SOLN COMPARISON:  02/02/2017 chest CT angiogram. 06/06/2018 CT abdomen/pelvis. FINDINGS: CTA CHEST FINDINGS Cardiovascular: Mild cardiomegaly. Diffuse thickening and coarse calcification of the aortic valve. No significant pericardial effusion/thickening. Left main and 3 vessel coronary atherosclerosis status post CABG. Atherosclerotic nonaneurysmal thoracic aorta, including severe atherosclerotic calcification of the ascending thoracic aorta. Normal caliber pulmonary arteries. No central pulmonary emboli. Mediastinum/Nodes: No discrete thyroid nodules. Unremarkable esophagus. No pathologically enlarged axillary, mediastinal or hilar lymph nodes. Lungs/Pleura: No pneumothorax. No pleural effusion. Mild centrilobular and paraseptal emphysema with diffuse bronchial wall thickening. No acute consolidative airspace disease or lung masses. No significant pulmonary nodules. Musculoskeletal: No aggressive appearing focal osseous lesions. Intact sternotomy wires. Mild thoracic spondylosis. CTA ABDOMEN AND PELVIS FINDINGS Hepatobiliary: Normal liver size. No liver masses. Stable granulomatous inferior right liver calcification. Cholelithiasis. No biliary ductal dilatation. Pancreas: Normal, with no mass or duct dilation. Spleen: Normal size. No mass. Adrenals/Urinary Tract: Stable diffuse bilateral adrenal thickening without discrete adrenal nodules, suggesting adrenal hyperplasia. No hydronephrosis. Several indeterminate renal cortical lesions in both kidneys, largest in the right kidney measuring 2.8 cm in the anterior upper right kidney with density 49 HU (series 14/image 371), increased from 2.4 cm on 06/05/2018 CT, and largest in the left kidney measuring  1.7 cm in the interpolar left kidney with density 83 HU (series 14/image 381), increased from 1.4 cm. Several subcentimeter hypodense renal cortical lesions in both kidneys are too small to characterize. Stable septated 5.7 cm Bosniak category 2 renal cyst in the lateral upper right kidney. Stable chronic mild diffuse bladder wall thickening without significant bladder distention. Stomach/Bowel: Small hiatal hernia. Otherwise normal nondistended stomach normal caliber small bowel with no small bowel wall thickening. Normal appendix. Marked sigmoid diverticulosis, with no large bowel wall thickening or significant pericolonic fat stranding. Vascular/Lymphatic: Atherosclerotic abdominal aorta with stable 3.2 cm infrarenal abdominal aortic aneurysm. Stable aneurysmal bilateral common iliac arteries measuring 2.8 cm diameter on the left and 1.9 cm diameter on the right. No pathologically enlarged lymph nodes in the abdomen or pelvis. Reproductive: Mild prostatomegaly. Other: No pneumoperitoneum, ascites or focal fluid collection. Musculoskeletal: No aggressive appearing focal osseous lesions. Moderate lumbar spondylosis. VASCULAR MEASUREMENTS PERTINENT TO TAVR: AORTA: Minimal Aortic Diameter-12.4 x 11.2 mm Severity of Aortic Calcification-severe RIGHT PELVIS: Right Common Iliac Artery - Minimal Diameter-5.6 x 5.5 mm Tortuosity-mild Calcification-severe Right External Iliac Artery - Minimal Diameter-4.3 x 4.3 mm Tortuosity-mild-to-moderate Calcification-moderate to severe Right Common Femoral Artery - Minimal Diameter-4.4 x 4.2 mm Tortuosity-mild Calcification-severe LEFT PELVIS: Left Common Iliac Artery - Minimal Diameter-9.7 x 9.1 mm Tortuosity-mild Calcification-severe Left External Iliac Artery - Minimal Diameter-3.8 x 2.8 mm Tortuosity-mild-to-moderate Calcification-severe Left Common Femoral Artery - Minimal Diameter-6.0 x 4.1 mm Tortuosity-mild Calcification-severe Review of the MIP images confirms the above  findings. IMPRESSION: 1. Vascular findings and measurements pertinent to potential TAVR procedure, as detailed. Note is made of diminutive external iliac arteries bilaterally and diffuse severe atherosclerotic calcification including along the ascending thoracic aorta. 2. Marked thickening and calcification of the aortic valve, compatible with the reported history of symptomatic severe aortic stenosis. 3. Mild cardiomegaly. 4. Several indeterminate renal cortical lesions in both kidneys, largest 2.8 cm in the anterior upper right kidney, mildly increased in size. MRI (preferred) or CT abdomen without and with IV contrast recommended for further characterization, to exclude renal cell carcinoma. 5. Infrarenal 3.2 cm Abdominal Aortic Aneurysm (ICD10-I71.9). Recommend follow-up aortic ultrasound in 3 years. This recommendation follows ACR consensus guidelines: White Paper of the ACR Incidental Findings Committee II on Vascular Findings. J Am Coll Radiol 2013; 10:789-794. 6. Bilateral common iliac artery aneurysms, stable, 2.8 cm on the left and 1.9 cm on the right. 7. Chronic findings include: Aortic Atherosclerosis (ICD10-I70.0) and Emphysema (ICD10-J43.9). Cholelithiasis. Small hiatal hernia. Marked sigmoid diverticulosis. Mild prostatomegaly. Electronically Signed   By: Ilona Sorrel M.D.   On: 08/21/2019 11:53   Disposition   Pt is being discharged home today in good condition, per Dr. Angelena Form.  Follow-up Plans & Appointments    Follow-up Information    Eileen Stanford, PA-C Follow up on 09/19/2019.   Specialties: Cardiology, Radiology Why: Please arrive 15 minutes early for your 4pm post-hospital cardiology follow-up appointment.  Contact information: [address] [place] [id number] [phone]            Discharge Medications   Allergies as of 09/05/2019   No Known Allergies     Medication List    TAKE these medications   amLODipine 5 MG tablet Commonly  known as: NORVASC TAKE ONE (1) TABLET BY MOUTH EVERY DAY What changed: See the new instructions.   aspirin 81 MG chewable tablet Chew 1 tablet (81 mg total) by mouth daily.   atorvastatin 80 MG tablet Commonly known as:  LIPITOR TAKE 1 TABLET BY MOUTH ONCE DAILY. What changed: when to take this   bisoprolol 5 MG tablet Commonly known as: ZEBETA TAKE 1/2 TABLET BY MOUTH TWICE DAILY.   budesonide 0.25 MG/2ML nebulizer solution Commonly known as: Pulmicort Take 2 mLs (0.25 mg total) by nebulization 2 (two) times daily. Dx: J44.9   clopidogrel 75 MG tablet Commonly known as: PLAVIX TAKE 1 TABLET BY MOUTH DAILY   fexofenadine 180 MG tablet Commonly known as: ALLEGRA Take 180 mg by mouth daily.   formoterol 20 MCG/2ML nebulizer solution Commonly known as: Perforomist Take 2 mLs (20 mcg total) by nebulization 2 (two) times daily.   furosemide 40 MG tablet Commonly known as: LASIX TAKE ONE TABLET BY MOUTH TWICE DAILY What changed: when to take this   isosorbide mononitrate 60 MG 24 hr tablet Commonly known as: IMDUR TAKE ONE TABLET BY MOUTH TWICE DAILY   nitroGLYCERIN 0.4 MG SL tablet Commonly known as: NITROSTAT TAKE 1 TABLET UNDER THE TONGUE AS NEEDEDFOR CHEST PAIN ( MAY REPEAT EVERY 5 MINUTES X 3) What changed: See the new instructions.   pantoprazole 40 MG tablet Commonly known as: PROTONIX TAKE 1 TABLET BY MOUTH ONCE DAILY   potassium chloride SA 20 MEQ tablet Commonly known as: KLOR-CON TAKE 1 TABLET BY MOUTH DAILY   spironolactone 25 MG tablet Commonly known as: ALDACTONE TAKE 1/2 TABLET BY MOUTH TWICE DAILY What changed: when to take this          Outstanding Labs/Studies   None  Duration of Discharge Encounter   Greater than 30 minutes including physician time.  Signed, Abigail Butts, PA-C 09/05/2019, 8:30 AM

## 2019-09-05 NOTE — Discharge Instructions (Signed)
ACTIVITY AND EXERCISE  Daily activity and exercise are an important part of your recovery. People recover at different rates depending on their general health and type of valve procedure.  Most people recovering from TAVR feel better relatively quickly   No lifting, pushing, pulling more than 10 pounds (examples to avoid: groceries, vacuuming, gardening, golfing) for six weeks for procedures through the chest wall or neck NOTE: You will typically see one of our providers 7-14 days after your procedure to discuss [place] the above activities.      DRIVING  Do not drive for until you are seen for follow up and cleared by a provider. Generally, we ask patient to not drive for 1 week after their procedure.  If you have been told by your doctor in the past that you may not drive, you must talk with him/her before you begin driving again.     DRESSING  Groin site: you may leave the clear dressing over the site for up to one week or until it falls off.     HYGIENE  If you had a femoral (leg) procedure, you may take a shower when you return home. After the shower, pat the site dry. Do NOT use powder, oils or lotions in your groin area until the site has completely healed.  If you had a chest procedure, you may shower when you return home unless specifically instructed not to by your discharging practitioner.             - DO NOT scrub incision; pat dry with a towel             - DO NOT apply any lotions, oils, powders to the incision             - No tub baths / swimming for at least 2 weeks.  If you notice any fevers, chills, increased pain, swelling, bleeding or pus, please contact your doctor.   ADDITIONAL INFORMATION  If you are going to have an upcoming dental procedure, please contact our office as you will require antibiotics ahead of time to prevent infection on your heart valve.    If you have any questions or concerns you can call the structural heart phone during  normal business hours 8am-4pm. If you have an urgent need after hours or weekends please call [phone] to talk to the on call provider for general cardiology. If you have an emergency that requires immediate attention, please call 911.    After TAVR Checklist  Check  Test Description   Follow up appointment in 1-2 weeks  You will see our structural heart physician assistant, Nell Range. Your incision sites will be checked and you will be cleared to drive and resume all normal activities if you are doing well.     1 month echo and follow up  You will have an echo to check on your new heart valve and be seen back in the office by Nell Range. Many times the echo is not read by your appointment time, but Joellen Jersey will call you later that day or the following day to report your results.   Follow up with your primary cardiologist You will need to be seen by your primary cardiologist in the following 3-6 months after your 1 month appointment in the valve clinic. Often times your Plavix or Aspirin will be discontinued during this time, but this is decided on a case by case basis.    1 year echo and  follow up You will have another echo to check on your heart valve after 1 year and be seen back in the office by Nell Range. This your last structural heart visit.   Bacterial endocarditis prophylaxis  You will have to take antibiotics for the rest of your life before all dental procedures (even teeth cleanings) to protect your heart valve. Antibiotics are also required before some surgeries. Please check with your cardiologist before scheduling any surgeries. Also, please make sure to tell us if you have a penicillin allergy as you will require an alternative antibiotic.

## 2019-09-06 ENCOUNTER — Telehealth: Payer: Self-pay

## 2019-09-06 NOTE — Telephone Encounter (Signed)
Attempted TOC call to patient. Left message on machine for pt to contact the office.

## 2019-09-07 NOTE — Telephone Encounter (Signed)
Patient contacted regarding discharge from [facility] on 09/05/2019.  Patient understands to follow up with provider Nell Range PA-C on 09/19/2019 at 4:00 PM at [facility] office. Patient understands discharge instructions? yes Patient understands medications and regiment? yes Patient understands to bring all medications to this visit? yes   Postop Surgical Patients:                What is your wound status? No signs/ symptoms of infection  .             Please do not place any creams/ lotions/ or antibiotic ointment on any surgical incisions/ wounds without physician approval. .             Please note that it is ok to remove your surgical dressing, shower (soap/ water), and pat the incision dry.  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  The pt does have COPD and I made his aware that his underlying lung disease most likely is contributing to his SOB. The pt will contact the office with any additional questions or concerns.

## 2019-09-11 LAB — ECHOCARDIOGRAM COMPLETE
Height: 65 in
Weight: 2264.57 oz

## 2019-09-14 MED FILL — Potassium Chloride Inj 2 mEq/ML: INTRAVENOUS | Qty: 40 | Status: AC

## 2019-09-14 MED FILL — Heparin Sodium (Porcine) Inj 1000 Unit/ML: INTRAMUSCULAR | Qty: 30 | Status: AC

## 2019-09-14 MED FILL — Magnesium Sulfate Inj 50%: INTRAMUSCULAR | Qty: 10 | Status: AC

## 2019-09-16 NOTE — Progress Notes (Signed)
[facility] [place]                                       Cardiology Office Note    Date:  09/19/2019   ID:  Steve Durham, DOB September 22, 1946, MRN [id number]  PCP:  Townsend Roger, MD  Cardiologist:  Dr. Angelena Form / Dr. Angelena Form & Dr. Cyndia Bent (TAVR)  CC: [facility] s/p TAVR  History of Present Illness:  Steve Durham is a 73 y.o. 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   He underwent CABG x5 by Dr. Servando Snare in 1993 with subsequent PCI/stenting of the mid LAD extending back into the LIMA graft in 2014 and stenting of the proximal left circumflex back into the left main in 2015. He was readmitted in October 2015 with unstable angina and catheterization showed stenosis within the left main stent and OM 1.  A drug-eluting stent was placed in the first OM and the left main stent restenosis was treated with balloon angioplasty.  He had repeat catheterizations in April 2016 and June 2016 with stable disease.  He has continued smoking with severe COPD.  He has aortic stenosis that has been followed by Dr. Angelena Form.  An echo in June 2019 showed a left ventricular ejection fraction of 50 to 55% with moderate aortic stenosis with a mean gradient of 20 mmHg.  There was also moderate aortic insufficiency.  His most recent echocardiogram on 07/05/2019 showed a drop in his left ventricular ejection fraction to 30 to 35%.  The mean gradient across aortic valve was 24 mmHg with a peak gradient of 44.6 mmHg.  There was moderate to severe aortic insufficiency.  He reported a several month history of progressive exertional fatigue and shortness of breath as well as some chest tightness. Pre TAVR cath showed severe triple vessel CAD s/p 3V CABG. The only patent graft is the LIMA to the  LAD. Chronic occlusion ostial RCA (not injected). The distal RCA/PDA fills from left to right collaterals from the LAD and from the Circumflex. Patent left main stent into the proximal Circumflex and first obtuse marginal branch. There is moderate restenosis in this stented segment but it does not appear to be flow limiting and is unchanged from last cath in 2019. Chronic occlusion proximal LAD. The mid and distal LAD fills from the patent LIMA graft. The stent from the LAD into the LIMA graft is patent without restenosis.   He was evaluated by the multidisciplinary valve team and underwent successful TAVR with a 26 mm Edwards S3U via the left subclavian approach on 09/04/19. Post op echo showed improvement in his LV systolic function with EF 35-40%, normally functioning TAVR with a mean gradient of 8.5 mmHg and no PVL. He was discharged on aspirin and plavix.  Today he presents to clinic for follow up. He is doing well. He cannot tell any appreciable difference in his symptoms since TAVR. He only notices he doesn't have urinate as much after his second dose of lasix at night. No CP or SOB. No LE edema, orthopnea or PND. No dizziness or syncope. No blood in stool or urine. No palpitations. Subclavian site a little  sore.    Past Medical History:  Diagnosis Date   Abnormal CT scan, kidney    07/2012 - multiple small cysts   Aortic stenosis    a. Mod AS/AI by cath 07/2012.;  b.  Echo (07/31/13) is: EF 55-60%, mild aortic stenosis (mean gradient 13);  c. Echo (5/15):  EF 40-45%, mild AS (mean 12 mmHg), mod AI   Back pain    resolved per patient 08/31/19   Carotid artery occlusion    a. Dopplers 07/2012: no significant high grade obstruction.   Cholelithiasis    Seen on CT 07/2012   COPD (chronic obstructive pulmonary disease) (HCC)    Coronary artery disease    a. CABG 10/1991. b. cath 07/2012 with prog dsz, turned down for re-do CABG - for med rx for now.; c. NSTEMI/PCI: DES to mLAD ext into  IMA graft;  d. Inf STEMI (5/15):  LM 60-70% then 99% before LAD, pLAD occl, pCFX stent 80+% ISR, oRCA 99% then occl (L-R collats to dRCA), L-LAD ok with patent stent, S-CFX occl (old), S-RCA occl (old); PCI: LM ext into CFX with Xience Alpine DES   Dyspnea    with exertion   GERD (gastroesophageal reflux disease)    Heart failure ([place]) systolic   Hyperlipidemia    Hyperlipoproteinemia    Hypertension    Ischemic cardiomyopathy    a. 2D ECHO: 08/02/2014; EF 45%; severe hypokinesis base/mid-inferolat segments and severe hypokinesis base inferior segment. Mild LVH, mild AS (may be underestimated due to LV dysfunction).  Mean gradient (S): 14 mm Hg. Peak gradient (S): 25 mm Hg. VTI ratio of LVOT to aortic valve: 0.37. Mod LA dilation. Mild RV systolic dysfxn     Past Surgical History:  Procedure Laterality Date   CARDIAC CATHETERIZATION  04/03/99   CARDIAC CATHETERIZATION N/A 03/20/2015   Procedure: Left Heart Cath and Cors/Grafts Angiography;  Surgeon: Jettie Booze, MD;  Location: [place] [facility];  Service: Cardiovascular;  Laterality: N/A;   CARDIAC CATHETERIZATION N/A 04/28/2016   Procedure: Right/Left Heart Cath and Coronary/Graft Angiography;  Surgeon: Burnell Blanks, MD;  Location: [place] [facility];  Service: Cardiovascular;  Laterality: N/A;   CARPAL TUNNEL RELEASE  2007   Excision mass dorsal left wrist   CORONARY ARTERY BYPASS GRAFT  1993   FASCIOTOMY Right 07/30/2013   Procedure: OPEN FASCIOTOMY RIGHT RING FINGER & RIGHT SMALL FINGER MULTIPLE LEVELS;  Surgeon: Wynonia Sours, MD;  Location: [place];  Service: Orthopedics;  Laterality: Right;   HERNIA REPAIR  1988   INGUINAL HERNIA REPAIR Left 06/07/2018   Procedure: LEFT INGUINAL HERNIA REPAIR;  Surgeon: [name], MD;  Location: [place];  Service: General;  Laterality: Left;   INSERTION OF MESH Left 06/07/2018   Procedure: INSERTION OF MESH;  Surgeon: [name], MD;  Location: [place];  Service: General;  Laterality: Left;   LEFT HEART CATHETERIZATION WITH CORONARY ANGIOGRAM N/A 02/28/2014   Procedure: LEFT HEART CATHETERIZATION WITH CORONARY ANGIOGRAM;  Surgeon: Troy Sine, MD;  Location: [facility] CATH LAB;  Service: Cardiovascular;  Laterality: N/A;   LEFT HEART CATHETERIZATION WITH CORONARY/GRAFT ANGIOGRAM N/A 08/10/2012   Procedure: LEFT HEART CATHETERIZATION WITH Beatrix Fetters;  Surgeon: Hillary Bow, MD;  Location: [facility] CATH LAB;  Service: Cardiovascular;  Laterality: N/A;   LEFT HEART CATHETERIZATION WITH CORONARY/GRAFT ANGIOGRAM N/A 08/01/2014   Procedure: LEFT HEART CATHETERIZATION WITH Beatrix Fetters;  Surgeon: Leonie Man, MD;  Location: [facility] CATH LAB;  Service:  Cardiovascular;  Laterality: N/A;   LEFT HEART CATHETERIZATION WITH CORONARY/GRAFT ANGIOGRAM N/A 02/12/2015   Procedure: LEFT HEART CATHETERIZATION WITH Beatrix Fetters;  Surgeon: Burnell Blanks, MD;  Location: [facility] CATH LAB;  Service: Cardiovascular;  Laterality: N/A;   PERCUTANEOUS CORONARY STENT INTERVENTION (PCI-S)  07/31/2013   Procedure: PERCUTANEOUS CORONARY STENT INTERVENTION (PCI-S);  Surgeon: Burnell Blanks, MD;  Location: [facility] CATH LAB;  Service: Cardiovascular;;  LIMA to LAD at the anastomosis with aortic root shot    RIGHT/LEFT HEART CATH AND CORONARY/GRAFT ANGIOGRAPHY N/A 04/04/2018   Procedure: RIGHT/LEFT HEART CATH AND CORONARY/GRAFT ANGIOGRAPHY;  Surgeon: Martinique, Peter M, MD;  Location: [place] [facility];  Service: Cardiovascular;  Laterality: N/A;   RIGHT/LEFT HEART CATH AND CORONARY/GRAFT ANGIOGRAPHY N/A 08/01/2019   Procedure: RIGHT/LEFT HEART CATH AND CORONARY/GRAFT ANGIOGRAPHY;  Surgeon: Burnell Blanks, MD;  Location: [place] [facility];  Service: Cardiovascular;  Laterality: N/A;   TEE WITHOUT CARDIOVERSION N/A 09/04/2019   Procedure: TRANSESOPHAGEAL ECHOCARDIOGRAM (TEE);  Surgeon: Burnell Blanks, MD;  Location: [place];   Service: Open Heart Surgery;  Laterality: N/A;   ULTRASOUND GUIDANCE FOR VASCULAR ACCESS Right 09/04/2019   Procedure: Ultrasound Guidance For Vascular Access;  Surgeon: Burnell Blanks, MD;  Location: [place];  Service: Open Heart Surgery;  Laterality: Right;    Current Medications: Outpatient Medications Prior to Visit  Medication Sig Dispense Refill   amLODipine (NORVASC) 5 MG tablet TAKE ONE (1) TABLET BY MOUTH EVERY DAY 90 tablet 1   atorvastatin (LIPITOR) 80 MG tablet TAKE 1 TABLET BY MOUTH ONCE DAILY. 90 tablet 3   bisoprolol (ZEBETA) 5 MG tablet TAKE 1/2 TABLET BY MOUTH TWICE DAILY. 90 tablet 1   budesonide (PULMICORT) 0.25 MG/2ML nebulizer solution Take 2 mLs (0.25 mg total) by nebulization 2 (two) times daily. Dx: J44.9 120 mL 11   clopidogrel (PLAVIX) 75 MG tablet TAKE 1 TABLET BY MOUTH DAILY 90 tablet 1   fexofenadine (ALLEGRA) 180 MG tablet Take 180 mg by mouth daily.      formoterol (PERFOROMIST) 20 MCG/2ML nebulizer solution Take 2 mLs (20 mcg total) by nebulization 2 (two) times daily. 120 mL 5   furosemide (LASIX) 40 MG tablet TAKE ONE TABLET BY MOUTH TWICE DAILY 180 tablet 1   isosorbide mononitrate (IMDUR) 60 MG 24 hr tablet TAKE ONE TABLET BY MOUTH TWICE DAILY 180 tablet 3   nitroGLYCERIN (NITROSTAT) 0.4 MG SL tablet TAKE 1 TABLET UNDER THE TONGUE AS NEEDEDFOR CHEST PAIN ( MAY REPEAT EVERY 5 MINUTES X 3) 25 tablet 3   pantoprazole (PROTONIX) 40 MG tablet TAKE 1 TABLET BY MOUTH ONCE DAILY 90 tablet 3   potassium chloride SA (K-DUR) 20 MEQ tablet TAKE 1 TABLET BY MOUTH DAILY 90 tablet 1   spironolactone (ALDACTONE) 25 MG tablet TAKE 1/2 TABLET BY MOUTH TWICE DAILY 90 tablet 1   aspirin 81 MG chewable tablet Chew 1 tablet (81 mg total) by mouth daily. (Patient not taking: Reported on 09/19/2019) 90 tablet 3   No facility-administered medications prior to visit.      Allergies:   Patient has no known allergies.   Social History   Socioeconomic History    Marital status: Married    Spouse name: Not on file   Number of children: 1   Years of education: Not on file   Highest education level: Not on file  Occupational History   Occupation: Plumbing    Comment: Retired in 2007  Scientist, product/process development  strain: Not on file   Food insecurity    Worry: Not on file    Inability: Not on file   Transportation needs    Medical: Not on file    Non-medical: Not on file  Tobacco Use   Smoking status: Current Every Day Smoker    Packs/day: 1.50    Years: 62.00    Pack years: 93.00    Types: Cigarettes   Smokeless tobacco: Never Used  Substance and Sexual Activity   Alcohol use: Yes    Alcohol/week: 14.0 standard drinks    Types: 14 Cans of beer per week   Drug use: No   Sexual activity: Not on file  Lifestyle   Physical activity    Days per week: Not on file    Minutes per session: Not on file   Stress: Not on file  Relationships   Social connections    Talks on phone: Not on file    Gets together: Not on file    Attends religious service: Not on file    Active member of club or organization: Not on file    Attends meetings of clubs or organizations: Not on file    Relationship status: Not on file  Other Topics Concern   Not on file  Social History Narrative   Lives in [place] with his family.  Does not routinely exercise.     Family History:  The patient's family history includes Allergies in his sister; Emphysema in his brother; Heart attack in his mother; Hypertension in his brother, father, mother, and sister.     ROS:   Please see the history of present illness.    ROS All other systems reviewed and are negative.   PHYSICAL EXAM:   VS:  BP 130/72    Pulse 64    Ht 5\' 5"  (1.651 m)    Wt 141 lb 3.2 oz (64 kg)    SpO2 99%    BMI 23.50 kg/m    GEN: Well nourished, well developed, in no acute distress HEENT: normal Neck: no JVD or masses Cardiac: RRR; soft flow murmur., rubs, or gallops,no  edema  Respiratory:  clear to auscultation bilaterally, normal work of breathing GI: soft, nontender, nondistended, + BS MS: no deformity or atrophy Skin: warm and dry, no rash. Subclavian site and groin site wihtout hematoma or ecchymosis Neuro:  Alert and Oriented x 3, Strength and sensation are intact Psych: euthymic mood, full affect   Wt Readings from Last 3 Encounters:  09/19/19 141 lb 3.2 oz (64 kg)  09/05/19 141 lb 8.6 oz (64.2 kg)  08/31/19 141 lb 8 oz (64.2 kg)      Studies/Labs Reviewed:   EKG:  EKG is ordered today.  The ekg ordered today demonstrates sinus with first deg AV block PR 246 ms. Non specific IVCD. HR 64  Recent Labs: 08/31/2019: ALT 25; B Natriuretic Peptide 839.8 09/05/2019: BUN 20; Creatinine, Ser 1.18; Hemoglobin 13.3; Magnesium 1.9; Platelets 152; Potassium 3.9; Sodium 136   Lipid Panel    Component Value Date/Time   CHOL 149 06/21/2019 0953   TRIG 88 06/21/2019 0953   HDL 54 06/21/2019 0953   CHOLHDL 2.8 06/21/2019 0953   CHOLHDL 3.3 08/18/2015 1056   VLDL 26 08/18/2015 1056   LDLCALC 78 06/21/2019 0953    Additional studies/ records that were reviewed today include:   TAVR OPERATIVE NOTE   Date of Procedure:  09/04/2019  Preoperative Diagnosis:      Severe Aortic Stenosis   Postoperative Diagnosis:    Same   Procedure:        Transcatheter Aortic Valve Replacement - Left Subclavian Approach             Edwards Sapien 3 Ultra THV (size 26 mm, model # 9750TFX, serial # [id number])              Co-Surgeons:                        Gaye Pollack, MD and Lauree Chandler, MD  Anesthesiologist:                  Perfecto Kingdom, MD  Echocardiographer:              Liane Comber, MD  Pre-operative Echo Findings: ? Severe aortic stenosis ? Moderate left ventricular systolic dysfunction  Post-operative Echo Findings: ? No paravalvular leak ? Moderate left ventricular systolic  dysfunction   ______________   Echo 09/05/19 IMPRESSIONS  1. Left ventricular ejection fraction, by visual estimation, is 35 to 40%. The left ventricle has moderately decreased function. There is no left ventricular hypertrophy.  2. Abnormal septal motion consistent with left bundle branch block.  3. Elevated left atrial pressure.  4. Left ventricular diastolic parameters are consistent with Grade II diastolic dysfunction (pseudonormalization).  5. Moderately dilated left ventricular internal cavity size.  6. The left ventricle has no regional wall motion abnormalities.  7. Global right ventricle has normal systolic function.The right ventricular size is normal. No increase in right ventricular wall thickness.  8. Left atrial size was severely dilated.  9. Right atrial size was normal. 10. The mitral valve is normal in structure. Trace mitral valve regurgitation. 11. The tricuspid valve is normal in structure. Tricuspid valve regurgitation is not demonstrated. 12. Aortic valve regurgitation is not visualized. 13. The pulmonic valve was normal in structure. Pulmonic valve regurgitation is not visualized. 14. TR signal is inadequate for assessing pulmonary artery systolic pressure. 15. The inferior vena cava is normal in size with <50% respiratory variability, suggesting right atrial pressure of 8 mmHg.    ASSESSMENT & PLAN:   Severe AS s/p TAVR: subclavian site and groin healing well. ECG with no HAVB. SBE prophylaxis discussed; I have RX'd amoxicillin. Continue aspirin and plavix ( he never started aspirin after discharge due to some confusion). I have asked him to start this today. I will see him back later this month for echo and follow up.   Chronic combined S/D CHF: appears euvolemic. Continue lasix 40mg  BID.   CAD: he has been on chronic plavix. Now on DAPT after TAVR. Continue medical therapy with DAPT, statin and BB.   Tobacco abuse: cut back to 3/4 pack. Counseled on  tobacco cessation.   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 MRI (preferred) or CT abdomen without and with IV contrast recommended for further characterization, to exclude renal cell carcinoma. He wants to wait until January to do this. I will wait until his 1 month appointment to get that set up.   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  Concerns regarding medicines are outlined above.  Medication changes, Labs  and Tests ordered today are listed in the Patient Instructions below. Patient Instructions  Medication Instructions:  1) RESTART ASPIRIN 81 mg daily 2) Your provider discussed the importance of taking an antibiotic prior to all dental visits to prevent damage to the heart valves from infection. You were given a prescription for AMOXIL 2,000mg  to take one hour prior to any dental appointment.    Follow-Up: Please keep your follow-up appointments as scheduled!    Signed, Angelena Form, PA-C  09/19/2019 4:43 PM    [facility] [address], [place], [place]  [address] Phone: [phone]; Fax: [phone]

## 2019-09-19 ENCOUNTER — Other Ambulatory Visit: Payer: Self-pay

## 2019-09-19 ENCOUNTER — Encounter: Payer: Self-pay | Admitting: Physician Assistant

## 2019-09-19 ENCOUNTER — Ambulatory Visit (INDEPENDENT_AMBULATORY_CARE_PROVIDER_SITE_OTHER): Payer: Medicare Other | Admitting: Physician Assistant

## 2019-09-19 VITALS — BP 130/72 | HR 64 | Ht 65.0 in | Wt 141.2 lb

## 2019-09-19 DIAGNOSIS — I2510 Atherosclerotic heart disease of native coronary artery without angina pectoris: Secondary | ICD-10-CM | POA: Diagnosis not present

## 2019-09-19 DIAGNOSIS — Z720 Tobacco use: Secondary | ICD-10-CM

## 2019-09-19 DIAGNOSIS — Z952 Presence of prosthetic heart valve: Secondary | ICD-10-CM | POA: Diagnosis not present

## 2019-09-19 DIAGNOSIS — N289 Disorder of kidney and ureter, unspecified: Secondary | ICD-10-CM | POA: Diagnosis not present

## 2019-09-19 DIAGNOSIS — I5042 Chronic combined systolic (congestive) and diastolic (congestive) heart failure: Secondary | ICD-10-CM | POA: Diagnosis not present

## 2019-09-19 DIAGNOSIS — I714 Abdominal aortic aneurysm, without rupture, unspecified: Secondary | ICD-10-CM

## 2019-09-19 MED ORDER — ASPIRIN EC 81 MG PO TBEC: 81.0000 mg | DELAYED_RELEASE_TABLET | Freq: Every day | ORAL | 3 refills | Status: DC

## 2019-09-19 MED ORDER — AMOXICILLIN 500 MG PO TABS: ORAL_TABLET | ORAL | 11 refills | Status: DC

## 2019-09-19 NOTE — Patient Instructions (Signed)
Medication Instructions:  1) RESTART ASPIRIN 81 mg daily 2) Your provider discussed the importance of taking an antibiotic prior to all dental visits to prevent damage to the heart valves from infection. You were given a prescription for AMOXIL 2,000mg  to take one hour prior to any dental appointment.    Follow-Up: Please keep your follow-up appointments as scheduled!

## 2019-10-04 ENCOUNTER — Other Ambulatory Visit: Payer: Self-pay

## 2019-10-04 ENCOUNTER — Encounter: Payer: Self-pay | Admitting: Physician Assistant

## 2019-10-04 ENCOUNTER — Ambulatory Visit (INDEPENDENT_AMBULATORY_CARE_PROVIDER_SITE_OTHER): Payer: Medicare Other | Admitting: Physician Assistant

## 2019-10-04 ENCOUNTER — Ambulatory Visit (HOSPITAL_COMMUNITY): Payer: Medicare Other | Attending: Cardiology

## 2019-10-04 VITALS — BP 120/76 | HR 65 | Ht 65.0 in | Wt 140.6 lb

## 2019-10-04 DIAGNOSIS — I5042 Chronic combined systolic (congestive) and diastolic (congestive) heart failure: Secondary | ICD-10-CM

## 2019-10-04 DIAGNOSIS — I255 Ischemic cardiomyopathy: Secondary | ICD-10-CM | POA: Diagnosis not present

## 2019-10-04 DIAGNOSIS — Z720 Tobacco use: Secondary | ICD-10-CM

## 2019-10-04 DIAGNOSIS — Z952 Presence of prosthetic heart valve: Secondary | ICD-10-CM

## 2019-10-04 DIAGNOSIS — I2510 Atherosclerotic heart disease of native coronary artery without angina pectoris: Secondary | ICD-10-CM

## 2019-10-04 DIAGNOSIS — N289 Disorder of kidney and ureter, unspecified: Secondary | ICD-10-CM

## 2019-10-04 DIAGNOSIS — I714 Abdominal aortic aneurysm, without rupture, unspecified: Secondary | ICD-10-CM

## 2019-10-04 MED ORDER — PERFLUTREN LIPID MICROSPHERE
1.0000 mL | INTRAVENOUS | Status: AC | PRN
  Administered 2019-10-04: 2 mL via INTRAVENOUS

## 2019-10-04 NOTE — Progress Notes (Signed)
[facility]                                       Cardiology Office Note    Date:  10/05/2019   ID:  Steve Durham, DOB 06/23/1946, MRN [id number]  PCP:  Townsend Roger, MD  Cardiologist:  Dr. Angelena Form / Dr. Angelena Form & Dr. Cyndia Bent (TAVR)  CC: 1 month s/p TAVR  History of Present Illness:  Steve Durham is a 73 y.o. 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   He underwent CABG x5 by Dr. Servando Snare in 1993 with subsequent PCI/stenting of the mid LAD extending back into the LIMA graft in 2014 and stenting of the proximal left circumflex back into the left main in 2015. He was readmitted in October 2015 with unstable angina and catheterization showed stenosis within the left main stent and OM 1.  A drug-eluting stent was placed in the first OM and the left main stent restenosis was treated with balloon angioplasty.  He had repeat catheterizations in April 2016 and June 2016 with stable disease.  He has continued smoking with severe COPD.  He has aortic stenosis that has been followed by Dr. Angelena Form.  An echo in June 2019 showed a left ventricular ejection fraction of 50 to 55% with moderate aortic stenosis with a mean gradient of 20 mmHg.  There was also moderate aortic insufficiency.  His most recent echocardiogram on 07/05/2019 showed a drop in his left ventricular ejection fraction to 30 to 35%.  The mean gradient across aortic valve was 24 mmHg with a peak gradient of 44.6 mmHg.  There was moderate to severe aortic insufficiency.  He reported a several month history of progressive exertional fatigue and shortness of breath as well as some chest tightness. Pre TAVR cath showed severe triple vessel CAD s/p 3V CABG. The only patent graft is the LIMA to  the LAD. Chronic occlusion ostial RCA (not injected). The distal RCA/PDA fills from left to right collaterals from the LAD and from the Circumflex. Patent left main stent into the proximal Circumflex and first obtuse marginal branch. There is moderate restenosis in this stented segment but it does not appear to be flow limiting and is unchanged from last cath in 2019. Chronic occlusion proximal LAD. The mid and distal LAD fills from the patent LIMA graft. The stent from the LAD into the LIMA graft is patent without restenosis.   He was evaluated by the multidisciplinary valve team and underwent successful TAVR with a 26 mm Edwards S3U via the left subclavian approach on 09/04/19. Post op echo showed improvement in his LV systolic function with EF 35-40%, normally functioning TAVR with a mean gradient of 8.5 mmHg and no PVL. He was discharged on aspirin and plavix.  Today he presents to clinic for follow up. He is doing well. He cannot tell a big difference however only talk a little bit more he does note that his breathing has improved a little and that he can lie more flat at night.  He denies lower extremity edema, orthopnea or PND.  No dizziness or syncope.  No chest pain. He has chronic  shortness of breath due to his lung disease.   Past Medical History:  Diagnosis Date  . Abnormal CT scan, kidney    07/2012 - multiple small cysts  . Aortic stenosis    a. Mod AS/AI by cath 07/2012.;  b.  Echo (07/31/13) is: EF 55-60%, mild aortic stenosis (mean gradient 13);  c. Echo (5/15):  EF 40-45%, mild AS (mean 12 mmHg), mod AI  . Back pain    resolved per patient 08/31/19  . Carotid artery occlusion    a. Dopplers 07/2012: no significant high grade obstruction.  . Cholelithiasis    Seen on CT 07/2012  . COPD (chronic obstructive pulmonary disease) ([place])   . Coronary artery disease    a. CABG 10/1991. b. cath 07/2012 with prog dsz, turned down for re-do CABG - for med rx for now.; c. NSTEMI/PCI: DES to  mLAD ext into IMA graft;  d. Inf STEMI (5/15):  LM 60-70% then 99% before LAD, pLAD occl, pCFX stent 80+% ISR, oRCA 99% then occl (L-R collats to dRCA), L-LAD ok with patent stent, S-CFX occl (old), S-RCA occl (old); PCI: LM ext into CFX with Xience Alpine DES  . Dyspnea    with exertion  . GERD (gastroesophageal reflux disease)   . Heart failure (HCC) systolic  . Hyperlipidemia   . Hyperlipoproteinemia   . Hypertension   . Ischemic cardiomyopathy    a. 2D ECHO: 08/02/2014; EF 45%; severe hypokinesis base/mid-inferolat segments and severe hypokinesis base inferior segment. Mild LVH, mild AS (may be underestimated due to LV dysfunction).  Mean gradient (S): 14 mm Hg. Peak gradient (S): 25 mm Hg. VTI ratio of LVOT to aortic valve: 0.37. Mod LA dilation. Mild RV systolic dysfxn     Past Surgical History:  Procedure Laterality Date  . CARDIAC CATHETERIZATION  04/03/99  . CARDIAC CATHETERIZATION N/A 03/20/2015   Procedure: Left Heart Cath and Cors/Grafts Angiography;  Surgeon: Jettie Booze, MD;  Location: [facility];  Service: Cardiovascular;  Laterality: N/A;  . CARDIAC CATHETERIZATION N/A 04/28/2016   Procedure: Right/Left Heart Cath and Coronary/Graft Angiography;  Surgeon: Burnell Blanks, MD;  Location: [facility];  Service: Cardiovascular;  Laterality: N/A;  . CARPAL TUNNEL RELEASE  2007   Excision mass dorsal left wrist  . CORONARY ARTERY BYPASS GRAFT  1993  . FASCIOTOMY Right 07/30/2013   Procedure: OPEN FASCIOTOMY RIGHT RING FINGER & RIGHT SMALL FINGER MULTIPLE LEVELS;  Surgeon: Wynonia Sours, MD;  Location: [facility];  Service: Orthopedics;  Laterality: Right;  . HERNIA REPAIR  1988  . INGUINAL HERNIA REPAIR Left 06/07/2018   Procedure: LEFT INGUINAL HERNIA REPAIR;  Surgeon: [name], MD;  Location: [facility];  Service: General;  Laterality: Left;  . INSERTION OF MESH Left 06/07/2018   Procedure: INSERTION OF MESH;  Surgeon: [name], MD;   Location: [facility];  Service: General;  Laterality: Left;  . LEFT HEART CATHETERIZATION WITH CORONARY ANGIOGRAM N/A 02/28/2014   Procedure: LEFT HEART CATHETERIZATION WITH CORONARY ANGIOGRAM;  Surgeon: Troy Sine, MD;  Location: [facility] CATH LAB;  Service: Cardiovascular;  Laterality: N/A;  . LEFT HEART CATHETERIZATION WITH CORONARY/GRAFT ANGIOGRAM N/A 08/10/2012   Procedure: LEFT HEART CATHETERIZATION WITH Beatrix Fetters;  Surgeon: Hillary Bow, MD;  Location: [facility] CATH LAB;  Service: Cardiovascular;  Laterality: N/A;  . LEFT HEART CATHETERIZATION WITH CORONARY/GRAFT ANGIOGRAM N/A 08/01/2014   Procedure: LEFT HEART CATHETERIZATION WITH Beatrix Fetters;  Surgeon: Leonie Man, MD;  Location: [facility] CATH LAB;  Service: Cardiovascular;  Laterality: N/A;  . LEFT HEART CATHETERIZATION WITH CORONARY/GRAFT ANGIOGRAM N/A 02/12/2015   Procedure: LEFT HEART CATHETERIZATION WITH Beatrix Fetters;  Surgeon: Burnell Blanks, MD;  Location: [facility] CATH LAB;  Service: Cardiovascular;  Laterality: N/A;  . PERCUTANEOUS CORONARY STENT INTERVENTION (PCI-S)  07/31/2013   Procedure: PERCUTANEOUS CORONARY STENT INTERVENTION (PCI-S);  Surgeon: Burnell Blanks, MD;  Location: [facility] CATH LAB;  Service: Cardiovascular;;  LIMA to LAD at the anastomosis with aortic root shot   . RIGHT/LEFT HEART CATH AND CORONARY/GRAFT ANGIOGRAPHY N/A 04/04/2018   Procedure: RIGHT/LEFT HEART CATH AND CORONARY/GRAFT ANGIOGRAPHY;  Surgeon: Martinique, Peter M, MD;  Location: [facility];  Service: Cardiovascular;  Laterality: N/A;  . RIGHT/LEFT HEART CATH AND CORONARY/GRAFT ANGIOGRAPHY N/A 08/01/2019   Procedure: RIGHT/LEFT HEART CATH AND CORONARY/GRAFT ANGIOGRAPHY;  Surgeon: Burnell Blanks, MD;  Location: [facility];  Service: Cardiovascular;  Laterality: N/A;  . TEE WITHOUT CARDIOVERSION N/A 09/04/2019   Procedure: TRANSESOPHAGEAL ECHOCARDIOGRAM (TEE);  Surgeon: Burnell Blanks, MD;   Location: [facility];  Service: Open Heart Surgery;  Laterality: N/A;  . ULTRASOUND GUIDANCE FOR VASCULAR ACCESS Right 09/04/2019   Procedure: Ultrasound Guidance For Vascular Access;  Surgeon: Burnell Blanks, MD;  Location: [facility];  Service: Open Heart Surgery;  Laterality: Right;    Current Medications: Outpatient Medications Prior to Visit  Medication Sig Dispense Refill  . amLODipine (NORVASC) 5 MG tablet TAKE ONE (1) TABLET BY MOUTH EVERY DAY 90 tablet 1  . amoxicillin (AMOXIL) 500 MG tablet Take 4 capsules (2,000 mg) one hour prior to all dental visits. 8 tablet 11  . aspirin EC 81 MG tablet Take 1 tablet (81 mg total) by mouth daily. 90 tablet 3  . atorvastatin (LIPITOR) 80 MG tablet TAKE 1 TABLET BY MOUTH ONCE DAILY. 90 tablet 3  . bisoprolol (ZEBETA) 5 MG tablet TAKE 1/2 TABLET BY MOUTH TWICE DAILY. 90 tablet 1  . budesonide (PULMICORT) 0.25 MG/2ML nebulizer solution Take 2 mLs (0.25 mg total) by nebulization 2 (two) times daily. Dx: J44.9 120 mL 11  . clopidogrel (PLAVIX) 75 MG tablet TAKE 1 TABLET BY MOUTH DAILY 90 tablet 1  . colchicine 0.6 MG tablet Take 0.6 mg by mouth as needed.    . fexofenadine (ALLEGRA) 180 MG tablet Take 180 mg by mouth daily.     . formoterol (PERFOROMIST) 20 MCG/2ML nebulizer solution Take 2 mLs (20 mcg total) by nebulization 2 (two) times daily. 120 mL 5  . furosemide (LASIX) 40 MG tablet TAKE ONE TABLET BY MOUTH TWICE DAILY 180 tablet 1  . isosorbide mononitrate (IMDUR) 60 MG 24 hr tablet TAKE ONE TABLET BY MOUTH TWICE DAILY 180 tablet 3  . nitroGLYCERIN (NITROSTAT) 0.4 MG SL tablet TAKE 1 TABLET UNDER THE TONGUE AS NEEDEDFOR CHEST PAIN ( MAY REPEAT EVERY 5 MINUTES X 3) 25 tablet 3  . pantoprazole (PROTONIX) 40 MG tablet TAKE 1 TABLET BY MOUTH ONCE DAILY 90 tablet 3  . potassium chloride SA (K-DUR) 20 MEQ tablet TAKE 1 TABLET BY MOUTH DAILY 90 tablet 1  . spironolactone (ALDACTONE) 25 MG tablet TAKE 1/2 TABLET BY MOUTH TWICE DAILY 90 tablet 1   No  facility-administered medications prior to visit.     Allergies:   Patient has no known allergies.   Social History   Socioeconomic History  . Marital status: Married    Spouse name: Not on file  . Number of children: 1  .  Years of education: Not on file  . Highest education level: Not on file  Occupational History  . Occupation: Plumbing    Comment: Retired in 2007  Tobacco Use  . Smoking status: Current Every Day Smoker    Packs/day: 1.50    Years: 62.00    Pack years: 93.00    Types: Cigarettes  . Smokeless tobacco: Never Used  Substance and Sexual Activity  . Alcohol use: Yes    Alcohol/week: 14.0 standard drinks    Types: 14 Cans of beer per week  . Drug use: No  . Sexual activity: Not on file  Other Topics Concern  . Not on file  Social History Narrative   Lives in [place] with his family.  Does not routinely exercise.   Social Determinants of Health   Financial Resource Strain:   . Difficulty of Paying Living Expenses: Not on file  Food Insecurity:   . Worried About Charity fundraiser in the Last Year: Not on file  . Ran Out of Food in the Last Year: Not on file  Transportation Needs:   . Lack of Transportation (Medical): Not on file  . Lack of Transportation (Non-Medical): Not on file  Physical Activity:   . Days of Exercise per Week: Not on file  . Minutes of Exercise per Session: Not on file  Stress:   . Feeling of Stress : Not on file  Social Connections:   . Frequency of Communication with Friends and Family: Not on file  . Frequency of Social Gatherings with Friends and Family: Not on file  . Attends Religious Services: Not on file  . Active Member of Clubs or Organizations: Not on file  . Attends Archivist Meetings: Not on file  . Marital Status: Not on file     Family History:  The patient's family history includes Allergies in his sister; Emphysema in his brother; Heart attack in his mother; Hypertension in his brother, father,  mother, and sister.     ROS:   Please see the history of present illness.    ROS All other systems reviewed and are negative.   PHYSICAL EXAM:   VS:  BP 120/76   Pulse 65   Ht 5\' 5"  (1.651 m)   Wt 140 lb 9.6 oz (63.8 kg)   SpO2 96%   BMI 23.40 kg/m    GEN: Well nourished, well developed, in no acute distress HEENT: normal Neck: no JVD or masses Cardiac: RRR; soft flow murmur., rubs, or gallops,no edema  Respiratory:  clear to auscultation bilaterally, normal work of breathing GI: soft, nontender, nondistended, + BS MS: no deformity or atrophy Skin: warm and dry, no rash. Subclavian site and groin site wihtout hematoma or ecchymosis Neuro:  Alert and Oriented x 3, Strength and sensation are intact Psych: euthymic mood, full affect   Wt Readings from Last 3 Encounters:  10/04/19 140 lb 9.6 oz (63.8 kg)  09/19/19 141 lb 3.2 oz (64 kg)  09/05/19 141 lb 8.6 oz (64.2 kg)      Studies/Labs Reviewed:   EKG:  EKG is NOT ordered today.    Recent Labs: 08/31/2019: ALT 25; B Natriuretic Peptide 839.8 09/05/2019: BUN 20; Creatinine, Ser 1.18; Hemoglobin 13.3; Magnesium 1.9; Platelets 152; Potassium 3.9; Sodium 136   Lipid Panel    Component Value Date/Time   CHOL 149 06/21/2019 0953   TRIG 88 06/21/2019 0953   HDL 54 06/21/2019 0953   CHOLHDL 2.8 06/21/2019 0953  CHOLHDL 3.3 08/18/2015 1056   VLDL 26 08/18/2015 1056   LDLCALC 78 06/21/2019 0953    Additional studies/ records that were reviewed today include:   TAVR OPERATIVE NOTE   Date of Procedure:                09/04/2019  Preoperative Diagnosis:      Severe Aortic Stenosis   Postoperative Diagnosis:    Same   Procedure:        Transcatheter Aortic Valve Replacement - Left Subclavian Approach             Edwards Sapien 3 Ultra THV (size 26 mm, model # 9750TFX, serial # [id number])              Co-Surgeons:                        Gaye Pollack, MD and Lauree Chandler, MD  Anesthesiologist:                   Perfecto Kingdom, MD  Echocardiographer:              Liane Comber, MD  Pre-operative Echo Findings: ? Severe aortic stenosis ? Moderate left ventricular systolic dysfunction  Post-operative Echo Findings: ? No paravalvular leak ? Moderate left ventricular systolic dysfunction   ______________   Echo 09/05/19 IMPRESSIONS  1. Left ventricular ejection fraction, by visual estimation, is 35 to 40%. The left ventricle has moderately decreased function. There is no left ventricular hypertrophy.  2. Abnormal septal motion consistent with left bundle branch block.  3. Elevated left atrial pressure.  4. Left ventricular diastolic parameters are consistent with Grade II diastolic dysfunction (pseudonormalization).  5. Moderately dilated left ventricular internal cavity size.  6. The left ventricle has no regional wall motion abnormalities.  7. Global right ventricle has normal systolic function.The right ventricular size is normal. No increase in right ventricular wall thickness.  8. Left atrial size was severely dilated.  9. Right atrial size was normal. 10. The mitral valve is normal in structure. Trace mitral valve regurgitation. 11. The tricuspid valve is normal in structure. Tricuspid valve regurgitation is not demonstrated. 12. Aortic valve regurgitation is not visualized. 13. The pulmonic valve was normal in structure. Pulmonic valve regurgitation is not visualized. 14. TR signal is inadequate for assessing pulmonary artery systolic pressure. 15. The inferior vena cava is normal in size with <50% respiratory variability, suggesting right atrial pressure of 8 mmHg.  ______________   Echo 10/04/19 IMPRESSIONS  1. Left ventricular ejection fraction, by visual estimation, is 30 to 35%. The left ventricle has severely decreased function. There is no left ventricular hypertrophy.  2. Definity contrast agent was given IV to delineate the left ventricular endocardial  borders.  3. Left ventricular diastolic parameters are consistent with Grade I diastolic dysfunction (impaired relaxation).  4. The left ventricle demonstrates global hypokinesis.  5. Global right ventricle has moderately reduced systolic function.The right ventricular size is moderately enlarged. No increase in right ventricular wall thickness.  6. Left atrial size was mildly dilated.  7. Right atrial size was mildly dilated.  8. The mitral valve is normal in structure. Mild mitral valve regurgitation. No evidence of mitral stenosis.  9. The tricuspid valve is normal in structure. Tricuspid valve regurgitation is not demonstrated. 10. Aortic valve regurgitation is not visualized. No evidence of aortic valve sclerosis or stenosis. 11. The pulmonic valve was normal in structure. Pulmonic valve regurgitation is  not visualized. 12. The inferior vena cava is normal in size with greater than 50% respiratory variability, suggesting right atrial pressure of 3 mmHg. 13. LVEF has decreased now 30-35% with diffuse hypokinesis. Normal transaortic gradients with peak/mean gradients 20/10 mmHg, no paravalvular leak.  Aortic Valve: The aortic valve has been repaired/replaced. Aortic valve regurgitation is not visualized. The aortic valve is structurally normal, with no evidence of sclerosis or stenosis. Aortic valve mean gradient measures 9.5 mmHg. Aortic valve peak gradient measures 18.2 mmHg. Aortic valve area, by VTI measures 1.61 cm. 26 Edwards Edwards Sapien bioprosthetic, stented aortic valve (TAVR) valve is present in the aortic position. Echo findings show normal structure and function of the aortic  prosthesis.   10/04/19 IMPRESSIONS  1. Left ventricular ejection fraction, by visual estimation, is 30 to 35%. The left ventricle has severely decreased function. There is no left ventricular hypertrophy.  2. Definity contrast agent was given IV to delineate the left ventricular endocardial borders.  3.  Left ventricular diastolic parameters are consistent with Grade I diastolic dysfunction (impaired relaxation).  4. The left ventricle demonstrates global hypokinesis.  5. Global right ventricle has moderately reduced systolic function.The right ventricular size is moderately enlarged. No increase in right ventricular wall thickness.  6. Left atrial size was mildly dilated.  7. Right atrial size was mildly dilated.  8. The mitral valve is normal in structure. Mild mitral valve regurgitation. No evidence of mitral stenosis.  9. The tricuspid valve is normal in structure. Tricuspid valve regurgitation is not demonstrated. 10. Aortic valve regurgitation is not visualized. No evidence of aortic valve sclerosis or stenosis. 11. The pulmonic valve was normal in structure. Pulmonic valve regurgitation is not visualized. 12. The inferior vena cava is normal in size with greater than 50% respiratory variability, suggesting right atrial pressure of 3 mmHg. 13. LVEF has decreased now 30-35% with diffuse hypokinesis. Normal transaortic gradients with peak/mean gradients 20/10 mmHg, no paravalvular leak.   ASSESSMENT & PLAN:   Severe AS s/p TAVR: Echo today shows EF 30-35% with normally functioning TAVR with mean gradient of 10 mmHg and no PVL. He has NYHA class II, mostly related to COPD.  He has had some improvement since TAVR although he thinks it has been marginal.  He has amoxicillin for SBE. Continue on aspirin and plavix. Stop aspirin after 6 months  (02/2020) and continue on chronic Plavix therapy.  Chronic combined S/D CHF: Appears euvolemic.  Continue Lasix 40 mg twice daily.  CAD: he has been on chronic plavix. Now on DAPT after TAVR. Continue medical therapy with Plavix, statin and BB.   Tobacco abuse: cut back to 3/4 pack. Counseled on tobacco cessation.   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 MRI (preferred) or CT abdomen without and with IV contrast recommended for further characterization, to exclude renal cell carcinoma. We have discussed this and he is adamant that does want to to get a scan at this time and if he changes his mind he will let us know.  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 Defer to primary cardiologist.   Medication Adjustments/Labs and Tests Ordered: Current medicines are reviewed at length with the patient today.  Concerns regarding medicines are outlined above.  Medication changes, Labs and Tests ordered today are listed in the Patient Instructions below. Patient Instructions  Medication Instructions:  1) DISCONTINUE  Aspirin after 03/03/20  *If you need a refill on your cardiac medications before your next appointment, please call your pharmacy*  Lab Work: None If you have labs (blood work) drawn today and your tests are completely normal, you will receive your results only by: [facility] Kitchen MyChart Message (if you have MyChart) OR . A paper copy in the mail If you have any lab test that is abnormal or we need to change your treatment, we will call you to review the results.  Testing/Procedures: None  Follow-Up: At [facility], you and your health needs are our priority.  As part of our continuing mission to provide you with exceptional heart care, we have created designated Provider Care Teams.  These Care Teams include your primary Cardiologist (physician) and Advanced Practice Providers (APPs -  Physician Assistants and Nurse Practitioners) who all work together to provide you with the care you need, when you need it.  Your next appointment:   4 month(s)  The format for your next appointment:   In Person  Provider:   You may see Lauree Chandler, MD or one of the following Advanced Practice Providers on your designated Care Team:    Melina Copa, PA-C  Ermalinda Barrios, PA-C   Other  Instructions      Signed, Angelena Form, PA-C  10/05/2019 [facility] [place], [place], [place]  [address] Phone: [phone]; Fax: [phone]

## 2019-10-04 NOTE — Patient Instructions (Signed)
Medication Instructions:  1) DISCONTINUE Aspirin after 03/03/20  *If you need a refill on your cardiac medications before your next appointment, please call your pharmacy*  Lab Work: None If you have labs (blood work) drawn today and your tests are completely normal, you will receive your results only by: [facility] Kitchen MyChart Message (if you have MyChart) OR . A paper copy in the mail If you have any lab test that is abnormal or we need to change your treatment, we will call you to review the results.  Testing/Procedures: None  Follow-Up: At [facility], you and your health needs are our priority.  As part of our continuing mission to provide you with exceptional heart care, we have created designated Provider Care Teams.  These Care Teams include your primary Cardiologist (physician) and Advanced Practice Providers (APPs -  Physician Assistants and Nurse Practitioners) who all work together to provide you with the care you need, when you need it.  Your next appointment:   4 month(s)  The format for your next appointment:   In Person  Provider:   You may see Lauree Chandler, MD or one of the following Advanced Practice Providers on your designated Care Team:    Melina Copa, PA-C  Ermalinda Barrios, PA-C   Other Instructions

## 2019-10-29 ENCOUNTER — Other Ambulatory Visit: Payer: Self-pay | Admitting: Cardiovascular Disease

## 2019-11-01 ENCOUNTER — Other Ambulatory Visit: Payer: Self-pay | Admitting: Cardiovascular Disease

## 2019-11-09 ENCOUNTER — Other Ambulatory Visit: Payer: Self-pay | Admitting: Cardiovascular Disease

## 2019-11-12 ENCOUNTER — Other Ambulatory Visit: Payer: Self-pay | Admitting: Cardiovascular Disease

## 2019-11-29 DIAGNOSIS — L578 Other skin changes due to chronic exposure to nonionizing radiation: Secondary | ICD-10-CM | POA: Diagnosis not present

## 2019-11-29 DIAGNOSIS — L570 Actinic keratosis: Secondary | ICD-10-CM | POA: Diagnosis not present

## 2019-11-29 DIAGNOSIS — L821 Other seborrheic keratosis: Secondary | ICD-10-CM | POA: Diagnosis not present

## 2019-12-14 DIAGNOSIS — Z23 Encounter for immunization: Secondary | ICD-10-CM | POA: Diagnosis not present

## 2019-12-28 ENCOUNTER — Telehealth: Payer: Self-pay | Admitting: Adult Health

## 2019-12-28 NOTE — Telephone Encounter (Signed)
Received faxed refill request for Budesonide neb from [facility] Medication first started at 3.2.2020 visit with TP Patient has not been seen in the office since 5.18.2020 with recs to follow up in 3 months  No appt has been made LMOM TCB x1 to discuss with patient that he has not been seen in close to a year and appt needs to be made to continue refilling meds

## 2019-12-31 NOTE — Telephone Encounter (Signed)
Patient now has appt with TP on 3.23.21 Will have TP sign rx

## 2020-01-08 ENCOUNTER — Encounter: Payer: Self-pay | Admitting: Adult Health

## 2020-01-08 ENCOUNTER — Other Ambulatory Visit: Payer: Self-pay

## 2020-01-08 ENCOUNTER — Ambulatory Visit (INDEPENDENT_AMBULATORY_CARE_PROVIDER_SITE_OTHER): Payer: Medicare Other | Admitting: Adult Health

## 2020-01-08 DIAGNOSIS — J449 Chronic obstructive pulmonary disease, unspecified: Secondary | ICD-10-CM | POA: Diagnosis not present

## 2020-01-08 MED ORDER — PERFOROMIST 20 MCG/2ML IN NEBU: 20.0000 ug | INHALATION_SOLUTION | Freq: Two times a day (BID) | RESPIRATORY_TRACT | 5 refills | Status: AC

## 2020-01-08 MED ORDER — BUDESONIDE 0.25 MG/2ML IN SUSP: 0.2500 mg | Freq: Two times a day (BID) | RESPIRATORY_TRACT | 5 refills | Status: AC

## 2020-01-08 NOTE — Progress Notes (Signed)
@Patient  ID: Steve Durham, male    DOB: 1946/05/21, 74 y.o.   MRN: [id number]  Chief Complaint  Patient presents with  . Follow-up    COPD     Referring provider: Townsend Roger, MD  HPI: 74 year old male active smoker followed for severe COPD  TEST/EVENTS :  2018 CT chest images independently reviewed showing mild centrilobular emphysema bilaterally, aortic calcification, there is nonspecific nodular changes in the periphery of the bases of his lungs with some nonspecific reticulation. No honeycombing, no intralobular septal thickening, no pleural effusion. Airways are patent  PFT: 2016 ratio 66%, FEV1 1.85 L 67% predicted, total lung capacity 5.42 L 87% predicted, DLCO 16.97 mL 62% predicted July 26, 2018 spirometry ratio 69%, FEV1 1.5 L 60% predicted  2D echo December 2020 EF 30 to 35%,   01/08/2020 Follow up : COPD  Patient presents for a follow-up visit.  Last seen May 2020.  He has underlying severe COPD.  Says overall he is doing some better.  He had a recent aortic valve replacement and feels since having that he has felt a lot better.  He still remains active at home.  He denies any increased cough or shortness of breath.  Chest x-ray November 2020 showed emphysema. Remains on perforomist and pulmicort neb Twice daily  .  Plans for covid vaccine later this week.  Continues to smoke , cessation discussed.    No Known Allergies  Immunization History  Administered Date(s) Administered  . Influenza, High Dose Seasonal PF 07/26/2018  . Influenza,inj,Quad PF,6+ Mos 06/18/2014    Past Medical History:  Diagnosis Date  . Abnormal CT scan, kidney    07/2012 - multiple small cysts  . Aortic stenosis    a. Mod AS/AI by cath 07/2012.;  b.  Echo (07/31/13) is: EF 55-60%, mild aortic stenosis (mean gradient 13);  c. Echo (5/15):  EF 40-45%, mild AS (mean 12 mmHg), mod AI  . Back pain    resolved per patient 08/31/19  . Carotid artery occlusion    a. Dopplers 07/2012:  no significant high grade obstruction.  . Cholelithiasis    Seen on CT 07/2012  . COPD (chronic obstructive pulmonary disease) ([place])   . Coronary artery disease    a. CABG 10/1991. b. cath 07/2012 with prog dsz, turned down for re-do CABG - for med rx for now.; c. NSTEMI/PCI: DES to mLAD ext into IMA graft;  d. Inf STEMI (5/15):  LM 60-70% then 99% before LAD, pLAD occl, pCFX stent 80+% ISR, oRCA 99% then occl (L-R collats to dRCA), L-LAD ok with patent stent, S-CFX occl (old), S-RCA occl (old); PCI: LM ext into CFX with Xience Alpine DES  . Dyspnea    with exertion  . GERD (gastroesophageal reflux disease)   . Heart failure (HCC) systolic  . Hyperlipidemia   . Hyperlipoproteinemia   . Hypertension   . Ischemic cardiomyopathy    a. 2D ECHO: 08/02/2014; EF 45%; severe hypokinesis base/mid-inferolat segments and severe hypokinesis base inferior segment. Mild LVH, mild AS (may be underestimated due to LV dysfunction).  Mean gradient (S): 14 mm Hg. Peak gradient (S): 25 mm Hg. VTI ratio of LVOT to aortic valve: 0.37. Mod LA dilation. Mild RV systolic dysfxn     Tobacco History: Social History   Tobacco Use  Smoking Status Current Every Day Smoker  . Packs/day: 1.50  . Years: 62.00  . Pack years: 93.00  . Types: Cigarettes  Smokeless Tobacco Never Used  Ready to quit: Not Answered Counseling given: Not Answered   Outpatient Medications Prior to Visit  Medication Sig Dispense Refill  . amLODipine (NORVASC) 5 MG tablet TAKE 1 TABLET BY MOUTH ONCE DAILY 90 tablet 3  . amoxicillin (AMOXIL) 500 MG tablet Take 4 capsules (2,000 mg) one hour prior to all dental visits. 8 tablet 11  . aspirin EC 81 MG tablet Take 1 tablet (81 mg total) by mouth daily. 90 tablet 3  . atorvastatin (LIPITOR) 80 MG tablet TAKE 1 TABLET BY MOUTH ONCE DAILY. 90 tablet 3  . bisoprolol (ZEBETA) 5 MG tablet TAKE 1/2 TABLET BY MOUTH TWICE A DAY 90 tablet 3  . clopidogrel (PLAVIX) 75 MG tablet TAKE 1 TABLET BY MOUTH  ONCE DAILY 90 tablet 3  . colchicine 0.6 MG tablet Take 0.6 mg by mouth as needed.    . fexofenadine (ALLEGRA) 180 MG tablet Take 180 mg by mouth daily.     . furosemide (LASIX) 40 MG tablet TAKE 1 TABLET BY MOUTH TWICE(2) DAILY 180 tablet 3  . isosorbide mononitrate (IMDUR) 60 MG 24 hr tablet TAKE ONE TABLET BY MOUTH TWICE DAILY 180 tablet 3  . nitroGLYCERIN (NITROSTAT) 0.4 MG SL tablet TAKE 1 TABLET UNDER THE TONGUE AS NEEDEDFOR CHEST PAIN ( MAY REPEAT EVERY 5 MINUTES X 3) 25 tablet 3  . pantoprazole (PROTONIX) 40 MG tablet TAKE 1 TABLET BY MOUTH ONCE DAILY 90 tablet 3  . potassium chloride SA (KLOR-CON) 20 MEQ tablet TAKE 1 TABLET BY MOUTH ONCE DAILY. 90 tablet 3  . spironolactone (ALDACTONE) 25 MG tablet TAKE 1/2 TABLET BY MOUTH TWICE A DAY 90 tablet 2  . budesonide (PULMICORT) 0.25 MG/2ML nebulizer solution Take 2 mLs (0.25 mg total) by nebulization 2 (two) times daily. Dx: J44.9 120 mL 11  . formoterol (PERFOROMIST) 20 MCG/2ML nebulizer solution Take 2 mLs (20 mcg total) by nebulization 2 (two) times daily. 120 mL 5   No facility-administered medications prior to visit.     Review of Systems:   Constitutional:   No  weight loss, night sweats,  Fevers, chills, + fatigue, or  lassitude.  HEENT:   No headaches,  Difficulty swallowing,  Tooth/dental problems, or  Sore throat,                No sneezing, itching, ear ache, nasal congestion, post nasal drip,   CV:  No chest pain,  Orthopnea, PND, swelling in lower extremities, anasarca, dizziness, palpitations, syncope.   GI  No heartburn, indigestion, abdominal pain, nausea, vomiting, diarrhea, change in bowel habits, loss of appetite, bloody stools.   Resp: .  No chest wall deformity  Skin: no rash or lesions.  GU: no dysuria, change in color of urine, no urgency or frequency.  No flank pain, no hematuria   MS:  No joint pain or swelling.  No decreased range of motion.  No back pain.    Physical Exam  BP 134/74 (BP Location:  Left Arm, Cuff Size: Normal)   Pulse 64   Ht 5\' 5"  (1.651 m)   Wt 144 lb (65.3 kg)   SpO2 98%   BMI 23.96 kg/m   GEN: A/Ox3; pleasant , NAD, elderly    HEENT:  [place]/AT,  , NOSE-clear, THROAT-clear, no lesions, no postnasal drip or exudate noted.   NECK:  Supple w/ fair ROM; no JVD; normal carotid impulses w/o bruits; no thyromegaly or nodules palpated; no lymphadenopathy.    RESP  Clear  P & A; w/o,  wheezes/ rales/ or rhonchi. no accessory muscle use, no dullness to percussion  CARD:  RRR, no m/r/g, tr peripheral edema, pulses intact, no cyanosis or clubbing.  GI:   Soft & nt; nml bowel sounds; no organomegaly or masses detected.   Musco: Warm bil, no deformities or joint swelling noted.   Neuro: alert, no focal deficits noted.    Skin: Warm, no lesions or rashes    Lab Results:  CBC    Component Value Date/Time   WBC 13.5 (H) 09/05/2019 0311   RBC 4.06 (L) 09/05/2019 0311   HGB 13.3 09/05/2019 0311   HGB 14.7 07/27/2019 0935   HCT 40.0 09/05/2019 0311   HCT 43.7 07/27/2019 0935   PLT 152 09/05/2019 0311   PLT 215 07/27/2019 0935   MCV 98.5 09/05/2019 0311   MCV 96 07/27/2019 0935   MCH 32.8 09/05/2019 0311   MCHC 33.3 09/05/2019 0311   RDW 12.5 09/05/2019 0311   RDW 11.7 07/27/2019 0935   LYMPHSABS 1.7 06/05/2018 1720   MONOABS 1.2 (H) 06/05/2018 1720   EOSABS 0.2 06/05/2018 1720   BASOSABS 0.1 06/05/2018 1720    BMET  BNP  Imaging: No results found.    PFT Results Latest Ref Rng & Units 06/27/2015  FVC-Pre L 2.66  FVC-Predicted Pre % 71  FVC-Post L 2.75  FVC-Predicted Post % 73  Pre FEV1/FVC % % 66  Post FEV1/FCV % % 67  FEV1-Pre L 1.76  FEV1-Predicted Pre % 64  FEV1-Post L 1.85  DLCO UNC% % 62  DLCO COR %Predicted % 82  TLC L 5.42  TLC % Predicted % 87  RV % Predicted % 117    No results found for: NITRICOXIDE      Assessment & Plan:   COPD GOLD II/ still smoking  Compensated on present regimen .  Smoking cessation .   Plan    Patient Instructions  Work on cutting back to quitting on smoking .  Continue on Pulmicort and Perforomist Neb Twice daily.  Ventolin Inhaler 2 puffs every 4hrs as needed for wheezing/shortness of breath .  Covid vaccine this week  Mucinex DM Twice daily  As needed  Cough/congestion .  Activity as tolerated.  Follow up with Dr. Hermina Staggers  or Sophiah Rolin NP in 6 months and As needed   Please contact office for sooner follow up if symptoms do not improve or worsen or seek emergency care            Rexene Edison, NP 01/08/2020

## 2020-01-08 NOTE — Assessment & Plan Note (Signed)
Compensated on present regimen .  Smoking cessation .   Plan  Patient Instructions  Work on cutting back to quitting on smoking .  Continue on Pulmicort and Perforomist Neb Twice daily.  Ventolin Inhaler 2 puffs every 4hrs as needed for wheezing/shortness of breath .  Covid vaccine this week  Mucinex DM Twice daily  As needed  Cough/congestion .  Activity as tolerated.  Follow up with Dr. Hermina Staggers  or Jkwon Treptow NP in 6 months and As needed   Please contact office for sooner follow up if symptoms do not improve or worsen or seek emergency care

## 2020-01-08 NOTE — Patient Instructions (Addendum)
Work on cutting back to quitting on smoking .  Continue on Pulmicort and Perforomist Neb Twice daily.  Ventolin Inhaler 2 puffs every 4hrs as needed for wheezing/shortness of breath .  Covid vaccine this week  Mucinex DM Twice daily  As needed  Cough/congestion .  Activity as tolerated.  Follow up with Dr. Hermina Staggers  or Massie Cogliano NP in 6 months and As needed   Please contact office for sooner follow up if symptoms do not improve or worsen or seek emergency care

## 2020-01-11 DIAGNOSIS — Z23 Encounter for immunization: Secondary | ICD-10-CM | POA: Diagnosis not present

## 2020-01-28 ENCOUNTER — Ambulatory Visit: Payer: Medicare Other | Admitting: Cardiovascular Disease

## 2020-02-01 ENCOUNTER — Ambulatory Visit (INDEPENDENT_AMBULATORY_CARE_PROVIDER_SITE_OTHER): Payer: Medicare Other | Admitting: Cardiovascular Disease

## 2020-02-01 ENCOUNTER — Other Ambulatory Visit: Payer: Self-pay

## 2020-02-01 ENCOUNTER — Encounter: Payer: Self-pay | Admitting: Cardiovascular Disease

## 2020-02-01 VITALS — BP 128/80 | HR 68 | Ht 65.0 in | Wt 144.0 lb

## 2020-02-01 DIAGNOSIS — E7800 Pure hypercholesterolemia, unspecified: Secondary | ICD-10-CM | POA: Diagnosis not present

## 2020-02-01 DIAGNOSIS — I350 Nonrheumatic aortic (valve) stenosis: Secondary | ICD-10-CM

## 2020-02-01 DIAGNOSIS — Z952 Presence of prosthetic heart valve: Secondary | ICD-10-CM

## 2020-02-01 DIAGNOSIS — I25118 Atherosclerotic heart disease of native coronary artery with other forms of angina pectoris: Secondary | ICD-10-CM | POA: Diagnosis not present

## 2020-02-01 NOTE — Patient Instructions (Signed)

## 2020-02-01 NOTE — Progress Notes (Signed)
Chief Complaint  Patient presents with   Follow-up    CAD   History of Present Illness: 74 yo male with history of CAD s/p CABG in 1993 with subsequent PCI/stenting, HTN, HLD, aortic stenosis s/p TAVR November 2020, COPD, cardiomyopathy and ongoing tobacco abuse here today for follow up. He has severe CAD. He was admitted October 2013 and cath performed with occlusion of LAD, patent IMA to LAD, occluded proximal RCA with occluded SVG to PDA, severe disease in left main into Circumflex with occluded vein graft to Circumflex. Re-do CABG suggested but his aorta was severely calcified. He was seen by Dr. Servando Snare with CT surgery and not felt to be a candidate for re-do bypass. Imdur was added. No intervention performed. He was admitted October 2014 with a NSTEMI. Cardiac cath with 70% left main stenosis, LAD occluded, mid LAD 99 after anastomosis of the graft, prox Circumflex 50% , OM stent with 70% ISR, pRCA occluded, SVG-PDA occluded, SVG-OM occluded, LIMA-LAD patent. A drug eluting stent was placed in the mid LAD extending back into the IMA graft. He was readmitted May 2015 with an inferolateral STEMI and found to have progression of disease in the left main/proximal Circumflex stent. A drug eluting stent was placed from the Circumflex back into the left main. He was readmitted October 2015 with unstable angina. Cardiac cath with stenosis left main stent and OM 1. A drug eluting stent was placed in the first OM branch. The left main stent restenosis was treated with balloon angioplasty. Repeat caths in April 2016 and June 2016 with stable disease. Despite all of his coronary interventions, he continued to smoke. He has been tried on Ranexa but stopped due to cost. Echo June 2019 with LVEF=50-55% with moderate aortic stenosis (mean gradient 20 mmHg) and moderate aortic insufficiency. Right and left heart cath at [facility] 04/04/18 showed severe 3V disease with patency of the stent in the IMA/LAD, stents left main,  Circumflex and OM. The SVG to the RCA and SVG to the OM known to be occluded. Right heart pressures were normal. There was a mild gradient acros the aortic valve. He was seen by Dr. Melvyn Novas in 2016 and not felt to have COPD at that time. He was seen in October 2019 by Dr. Lake Bells in the pulmonary office and diagnosed with severe COPD.  I saw him September 2020 and he reported continued dyspnea at rest and with exertion. Echo 07/05/19 with LVEF=30-35%. Mild mitral regurgitation. The aortic valve leaflets are thickened and calcified. Mean gradient 24 mmHg, peak gradient 44.6 mmHg, AVA 1.08 cm2, dimensionless index 0.36. There is moderate to severe aortic insufficiency. Cardiac cath October 2020 with severe triple vessel CAD, patent LIMA to LAD. Patent left main stent. Overall stable from prior cath. He underwent TAVR November 2020 (26 mm Edwards S3U via the left subclavian approach). Post op echo showed improvement in his LV systolic function with EF 35-40%, normally functioning TAVR with a mean gradient of 8.5 mmHg and no PVL. He was discharged on aspirin and plavix.  He is here today for follow up. The patient denies any palpitations, lower extremity edema, orthopnea, PND, dizziness, near syncope or syncope. He has chronic, daily chest pain, unchanged. He has baseline dyspnea.   Primary Care Physician: Townsend Roger, MD  Past Medical History:  Diagnosis Date   Abnormal CT scan, kidney    07/2012 - multiple small cysts   Aortic stenosis    a. Mod AS/AI by cath 07/2012.;  b.  Echo (07/31/13) is: EF 55-60%, mild aortic stenosis (mean gradient 13);  c. Echo (5/15):  EF 40-45%, mild AS (mean 12 mmHg), mod AI   Back pain    resolved per patient 08/31/19   Carotid artery occlusion    a. Dopplers 07/2012: no significant high grade obstruction.   Cholelithiasis    Seen on CT 07/2012   COPD (chronic obstructive pulmonary disease) (HCC)    Coronary artery disease    a. CABG 10/1991. b. cath 07/2012 with  prog dsz, turned down for re-do CABG - for med rx for now.; c. NSTEMI/PCI: DES to mLAD ext into IMA graft;  d. Inf STEMI (5/15):  LM 60-70% then 99% before LAD, pLAD occl, pCFX stent 80+% ISR, oRCA 99% then occl (L-R collats to dRCA), L-LAD ok with patent stent, S-CFX occl (old), S-RCA occl (old); PCI: LM ext into CFX with Xience Alpine DES   Dyspnea    with exertion   GERD (gastroesophageal reflux disease)    Heart failure ([place]) systolic   Hyperlipidemia    Hyperlipoproteinemia    Hypertension    Ischemic cardiomyopathy    a. 2D ECHO: 08/02/2014; EF 45%; severe hypokinesis base/mid-inferolat segments and severe hypokinesis base inferior segment. Mild LVH, mild AS (may be underestimated due to LV dysfunction).  Mean gradient (S): 14 mm Hg. Peak gradient (S): 25 mm Hg. VTI ratio of LVOT to aortic valve: 0.37. Mod LA dilation. Mild RV systolic dysfxn     Past Surgical History:  Procedure Laterality Date   CARDIAC CATHETERIZATION  04/03/99   CARDIAC CATHETERIZATION N/A 03/20/2015   Procedure: Left Heart Cath and Cors/Grafts Angiography;  Surgeon: Jettie Booze, MD;  Location: [place] [facility];  Service: Cardiovascular;  Laterality: N/A;   CARDIAC CATHETERIZATION N/A 04/28/2016   Procedure: Right/Left Heart Cath and Coronary/Graft Angiography;  Surgeon: Burnell Blanks, MD;  Location: [place] [facility];  Service: Cardiovascular;  Laterality: N/A;   CARPAL TUNNEL RELEASE  2007   Excision mass dorsal left wrist   CORONARY ARTERY BYPASS GRAFT  1993   FASCIOTOMY Right 07/30/2013   Procedure: OPEN FASCIOTOMY RIGHT RING FINGER & RIGHT SMALL FINGER MULTIPLE LEVELS;  Surgeon: Wynonia Sours, MD;  Location: [place];  Service: Orthopedics;  Laterality: Right;   HERNIA REPAIR  1988   INGUINAL HERNIA REPAIR Left 06/07/2018   Procedure: LEFT INGUINAL HERNIA REPAIR;  Surgeon: [name], MD;  Location: [place];  Service: General;  Laterality: Left;   INSERTION OF  MESH Left 06/07/2018   Procedure: INSERTION OF MESH;  Surgeon: [name], MD;  Location: [place];  Service: General;  Laterality: Left;   LEFT HEART CATHETERIZATION WITH CORONARY ANGIOGRAM N/A 02/28/2014   Procedure: LEFT HEART CATHETERIZATION WITH CORONARY ANGIOGRAM;  Surgeon: Troy Sine, MD;  Location: [facility] CATH LAB;  Service: Cardiovascular;  Laterality: N/A;   LEFT HEART CATHETERIZATION WITH CORONARY/GRAFT ANGIOGRAM N/A 08/10/2012   Procedure: LEFT HEART CATHETERIZATION WITH Beatrix Fetters;  Surgeon: Hillary Bow, MD;  Location: [facility] CATH LAB;  Service: Cardiovascular;  Laterality: N/A;   LEFT HEART CATHETERIZATION WITH CORONARY/GRAFT ANGIOGRAM N/A 08/01/2014   Procedure: LEFT HEART CATHETERIZATION WITH Beatrix Fetters;  Surgeon: Leonie Man, MD;  Location: [facility] CATH LAB;  Service: Cardiovascular;  Laterality: N/A;   LEFT HEART CATHETERIZATION WITH CORONARY/GRAFT ANGIOGRAM N/A 02/12/2015   Procedure: LEFT HEART CATHETERIZATION WITH Beatrix Fetters;  Surgeon: Burnell Blanks, MD;  Location: [facility] CATH LAB;  Service: Cardiovascular;  Laterality: N/A;  PERCUTANEOUS CORONARY STENT INTERVENTION (PCI-S)  07/31/2013   Procedure: PERCUTANEOUS CORONARY STENT INTERVENTION (PCI-S);  Surgeon: Burnell Blanks, MD;  Location: [facility] CATH LAB;  Service: Cardiovascular;;  LIMA to LAD at the anastomosis with aortic root shot    RIGHT/LEFT HEART CATH AND CORONARY/GRAFT ANGIOGRAPHY N/A 04/04/2018   Procedure: RIGHT/LEFT HEART CATH AND CORONARY/GRAFT ANGIOGRAPHY;  Surgeon: Martinique, Peter M, MD;  Location: [place] [facility];  Service: Cardiovascular;  Laterality: N/A;   RIGHT/LEFT HEART CATH AND CORONARY/GRAFT ANGIOGRAPHY N/A 08/01/2019   Procedure: RIGHT/LEFT HEART CATH AND CORONARY/GRAFT ANGIOGRAPHY;  Surgeon: Burnell Blanks, MD;  Location: [place] [facility];  Service: Cardiovascular;  Laterality: N/A;   TEE WITHOUT CARDIOVERSION N/A 09/04/2019   Procedure:  TRANSESOPHAGEAL ECHOCARDIOGRAM (TEE);  Surgeon: Burnell Blanks, MD;  Location: [place];  Service: Open Heart Surgery;  Laterality: N/A;   ULTRASOUND GUIDANCE FOR VASCULAR ACCESS Right 09/04/2019   Procedure: Ultrasound Guidance For Vascular Access;  Surgeon: Burnell Blanks, MD;  Location: [place];  Service: Open Heart Surgery;  Laterality: Right;    Current Outpatient Medications  Medication Sig Dispense Refill   amLODipine (NORVASC) 5 MG tablet TAKE 1 TABLET BY MOUTH ONCE DAILY 90 tablet 3   amoxicillin (AMOXIL) 500 MG tablet Take 4 capsules (2,000 mg) one hour prior to all dental visits. 8 tablet 11   aspirin EC 81 MG tablet Take 1 tablet (81 mg total) by mouth daily. 90 tablet 3   atorvastatin (LIPITOR) 80 MG tablet TAKE 1 TABLET BY MOUTH ONCE DAILY. 90 tablet 3   bisoprolol (ZEBETA) 5 MG tablet TAKE 1/2 TABLET BY MOUTH TWICE A DAY 90 tablet 3   budesonide (PULMICORT) 0.25 MG/2ML nebulizer solution Take 2 mLs (0.25 mg total) by nebulization 2 (two) times daily. Dx: J44.9 120 mL 5   clopidogrel (PLAVIX) 75 MG tablet TAKE 1 TABLET BY MOUTH ONCE DAILY 90 tablet 3   colchicine 0.6 MG tablet Take 0.6 mg by mouth as needed.     fexofenadine (ALLEGRA) 180 MG tablet Take 180 mg by mouth daily.      formoterol (PERFOROMIST) 20 MCG/2ML nebulizer solution Take 2 mLs (20 mcg total) by nebulization 2 (two) times daily. DX: J44.9 120 mL 5   furosemide (LASIX) 40 MG tablet TAKE 1 TABLET BY MOUTH TWICE(2) DAILY 180 tablet 3   isosorbide mononitrate (IMDUR) 60 MG 24 hr tablet TAKE ONE TABLET BY MOUTH TWICE DAILY 180 tablet 3   nitroGLYCERIN (NITROSTAT) 0.4 MG SL tablet TAKE 1 TABLET UNDER THE TONGUE AS NEEDEDFOR CHEST PAIN ( MAY REPEAT EVERY 5 MINUTES X 3) 25 tablet 3   pantoprazole (PROTONIX) 40 MG tablet TAKE 1 TABLET BY MOUTH ONCE DAILY 90 tablet 3   potassium chloride SA (KLOR-CON) 20 MEQ tablet TAKE 1 TABLET BY MOUTH ONCE DAILY. 90 tablet 3   spironolactone (ALDACTONE) 25  MG tablet TAKE 1/2 TABLET BY MOUTH TWICE A DAY 90 tablet 2   No current facility-administered medications for this visit.    No Known Allergies  Social History   Socioeconomic History   Marital status: Married    Spouse name: Not on file   Number of children: 1   Years of education: Not on file   Highest education level: Not on file  Occupational History   Occupation: Plumbing    Comment: Retired in 2007  Tobacco Use   Smoking status: Current Every Day Smoker    Packs/day: 1.50    Years: 62.00  Pack years: 93.00    Types: Cigarettes   Smokeless tobacco: Never Used  Substance and Sexual Activity   Alcohol use: Yes    Alcohol/week: 14.0 standard drinks    Types: 14 Cans of beer per week   Drug use: No   Sexual activity: Not on file  Other Topics Concern   Not on file  Social History Narrative   Lives in [place] with his family.  Does not routinely exercise.   Social Determinants of Health   Financial Resource Strain:    Difficulty of Paying Living Expenses:   Food Insecurity:    Worried About Charity fundraiser in the Last Year:    Arboriculturist in the Last Year:   Transportation Needs:    Film/video editor (Medical):    Lack of Transportation (Non-Medical):   Physical Activity:    Days of Exercise per Week:    Minutes of Exercise per Session:   Stress:    Feeling of Stress :   Social Connections:    Frequency of Communication with Friends and Family:    Frequency of Social Gatherings with Friends and Family:    Attends Religious Services:    Active Member of Clubs or Organizations:    Attends Music therapist:    Marital Status:   Intimate Partner Violence:    Fear of Current or Ex-Partner:    Emotionally Abused:    Physically Abused:    Sexually Abused:     Family History  Problem Relation Age of Onset   Heart attack Mother        died @ 1.   Hypertension Mother    Hypertension Father     Hypertension Sister    Emphysema Brother    Hypertension Brother    Allergies Sister    Stroke Neg Hx     Review of Systems:  As stated in the HPI and otherwise negative.   BP 128/80    Pulse 68    Ht 5\' 5"  (1.651 m)    Wt 144 lb (65.3 kg)    SpO2 99%    BMI 23.96 kg/m   Physical Examination: General: Well developed, well nourished, NAD  HEENT: OP clear, mucus membranes moist  SKIN: warm, dry. No rashes. Neuro: No focal deficits  Musculoskeletal: Muscle strength 5/5 all ext  Psychiatric: Mood and affect normal  Neck: No JVD, no carotid bruits, no thyromegaly, no lymphadenopathy.  Lungs:Clear bilaterally with decreased BS throughout. No wheezes, rhonci, crackles Cardiovascular: Regular rate and rhythm. Systolic murmur.  Abdomen:Soft. Bowel sounds present. Non-tender.  Extremities: No lower extremity edema. Pulses are 2 + in the bilateral DP/PT.  Cardiac Cath June 2019:    Non-stenotic Ost LM to LM lesion previously treated.  Ost LAD to Prox LAD lesion is 100% stenosed.  Ost Cx to Prox Cx lesion is 50% stenosed.  Ost RCA to Prox RCA lesion is 100% stenosed.  Previously placed Dist LAD stent (unknown type) is widely patent.  Previously placed Ost 1st Mrg stent (unknown type) is widely patent.  LV end diastolic pressure is normal.  Patent LIMA to LAD  Occluded SVG to OM  Occluded SVG to RCA  Diagnostic Dominance: Co-dominant  Intervention    Echo 10/03/20:  1. Left ventricular ejection fraction, by visual estimation, is 30 to  35%. The left ventricle has severely decreased function. There is no left  ventricular hypertrophy.  2. Definity contrast agent was given IV to delineate the  left ventricular  endocardial borders.  3. Left ventricular diastolic parameters are consistent with Grade I  diastolic dysfunction (impaired relaxation).  4. The left ventricle demonstrates global hypokinesis.  5. Global right ventricle has moderately reduced systolic  function.The  right ventricular size is moderately enlarged. No increase in right  ventricular wall thickness.  6. Left atrial size was mildly dilated.  7. Right atrial size was mildly dilated.  8. The mitral valve is normal in structure. Mild mitral valve  regurgitation. No evidence of mitral stenosis.  9. The tricuspid valve is normal in structure. Tricuspid valve  regurgitation is not demonstrated.  10. Aortic valve regurgitation is not visualized. No evidence of aortic  valve sclerosis or stenosis.  11. The pulmonic valve was normal in structure. Pulmonic valve  regurgitation is not visualized.  12. The inferior vena cava is normal in size with greater than 50%  respiratory variability, suggesting right atrial pressure of 3 mmHg.  13. LVEF has decreased now 30-35% with diffuse hypokinesis. Normal  transaortic gradients with peak/mean gradients 20/10 mmHg, no paravalvular  leak.   EKG:  EKG is not ordered today.  This demonstrates   Recent Labs: 08/31/2019: ALT 25; B Natriuretic Peptide 839.8 09/05/2019: BUN 20; Creatinine, Ser 1.18; Hemoglobin 13.3; Magnesium 1.9; Platelets 152; Potassium 3.9; Sodium 136   Lipid Panel    Component Value Date/Time   CHOL 149 06/21/2019 0953   TRIG 88 06/21/2019 0953   HDL 54 06/21/2019 0953   CHOLHDL 2.8 06/21/2019 0953   CHOLHDL 3.3 08/18/2015 1056   VLDL 26 08/18/2015 1056   LDLCALC 78 06/21/2019 0953     Wt Readings from Last 3 Encounters:  02/01/20 144 lb (65.3 kg)  01/08/20 144 lb (65.3 kg)  10/04/19 140 lb 9.6 oz (63.8 kg)     Other studies Reviewed: Additional studies/ records that were reviewed today include:  Review of the above records demonstrates:    Assessment and Plan:   1. CAD s/p CABG with stable angina: He had CABG in 1993 and has had multiple stenting procedures since then. Last cath in October 2020 with stable CAD. The LIMA to his LAD is patent, stent from left main to Circumflex is patent, RCA occluded  with filling by collaterals. Baseline mild daily chest pain. Continue ASA, Plavix, Imdur, statin and beta blocker.    2. Hyperlipidemia: Continue statin. LDL near goal in September 2020.  3. Aortic valve disease with low flow/low gradient severe stenosis and moderately severe aortic valve insufficiency: s/p TAVR November 2020 and doing well.   4. HTN: BP is well controlled.Continue current therapy  5. Tobacco abuse: He does not wish to stop smoking. Smoking cessation is recommended.   6. Chronic diastolic CHF: Weight is stable. Volume status is ok.    Current medicines are reviewed at length with the patient today.  The patient does not have concerns regarding medicines.  The following changes have been made:    Labs/ tests ordered today include:   No orders of the defined types were placed in this encounter.   Disposition:   FU in valve clinic in 6 months then with me in 12 months  Signed, Lauree Chandler, MD 02/01/2020 4:43 PM    [address], [place],   [address] Phone: [phone]; Fax: [phone]

## 2020-05-09 ENCOUNTER — Other Ambulatory Visit: Payer: Self-pay

## 2020-05-09 ENCOUNTER — Encounter: Payer: Self-pay | Admitting: Adult Health

## 2020-05-09 ENCOUNTER — Ambulatory Visit (INDEPENDENT_AMBULATORY_CARE_PROVIDER_SITE_OTHER): Payer: Medicare Other | Admitting: Adult Health

## 2020-05-09 DIAGNOSIS — I25118 Atherosclerotic heart disease of native coronary artery with other forms of angina pectoris: Secondary | ICD-10-CM

## 2020-05-09 DIAGNOSIS — F17210 Nicotine dependence, cigarettes, uncomplicated: Secondary | ICD-10-CM

## 2020-05-09 DIAGNOSIS — J449 Chronic obstructive pulmonary disease, unspecified: Secondary | ICD-10-CM | POA: Diagnosis not present

## 2020-05-09 NOTE — Assessment & Plan Note (Signed)
Smoking cessation discussed 

## 2020-05-09 NOTE — Assessment & Plan Note (Signed)
Compensated on present regimen  Smoking cessation encouraged   Plan  Patient Instructions  Work on cutting back to quitting on smoking .  Continue on Pulmicort and Perforomist Neb Twice daily.  Ventolin Inhaler 2 puffs every 4hrs as needed for wheezing/shortness of breath .   Mucinex DM Twice daily  As needed  Cough/congestion .  Activity as tolerated.  Follow up with Dr. Hermina Staggers  or Meggie Laseter NP in 6 months and As needed   Please contact office for sooner follow up if symptoms do not improve or worsen or seek emergency care

## 2020-05-09 NOTE — Progress Notes (Signed)
@Patient  ID: Steve Durham, male    DOB: [birth year], 74 y.o.   MRN: [id number]  Chief Complaint  Patient presents with  . Follow-up    COPD     Referring provider: Townsend Roger, MD  HPI: 74 year old male active smoker followed for severe COPD Medical history significant for congestive heart failure, coronary artery disease status post CABG, hyperlipidemia, hypertension, ischemic cardiomyopathy, TAVR   TEST/EVENTS :  2018 CT chest mild emphysema, nonspecific nodular changes in the periphery of the lungs with some nonspecific reticulation.  No honeycombing or septal thickening.  Airways are patent.  2016 ratio 66%, FEV1 1.85 L 67% predicted, total lung capacity 5.42 L 87% predicted, DLCO 16.97 mL 62% predicted July 26, 2018 spirometry ratio 69%, FEV1 1.5 L 60% predicted  2D echo December 2020 EF 30 to 35%,   05/09/2020 Follow up : COPD  Patient presents for a 57-month follow-up.  Patient has underlying moderate to severe COPD.  He is maintained on Pulmicort and Perforomist nebulizers twice daily.  He does admit that he does not take on a consistent basis.  But overall feels breathing is doing about the same with no flare of cough or wheezing.  Gets winded with heavy activities. Very active , mows 2 yard,  Has 2 acres. Independent. Lives with wife. Continues to smoke.  Smoking cessation was discussed..  Vaccines are up-to-date.  Declines LDCT chest program.   No Known Allergies  Immunization History  Administered Date(s) Administered  . Influenza, High Dose Seasonal PF 07/26/2018  . Influenza,inj,Quad PF,6+ Mos 06/18/2014  . Influenza-Unspecified 08/10/2019    Past Medical History:  Diagnosis Date  . Abnormal CT scan, kidney    07/2012 - multiple small cysts  . Aortic stenosis    a. Mod AS/AI by cath 07/2012.;  b.  Echo (07/31/13) is: EF 55-60%, mild aortic stenosis (mean gradient 13);  c. Echo (5/15):  EF 40-45%, mild AS (mean 12 mmHg), mod AI  . Back pain    resolved  per patient 08/31/19  . Carotid artery occlusion    a. Dopplers 07/2012: no significant high grade obstruction.  . Cholelithiasis    Seen on CT 07/2012  . COPD (chronic obstructive pulmonary disease) ([place])   . Coronary artery disease    a. CABG 10/1991. b. cath 07/2012 with prog dsz, turned down for re-do CABG - for med rx for now.; c. NSTEMI/PCI: DES to mLAD ext into IMA graft;  d. Inf STEMI (5/15):  LM 60-70% then 99% before LAD, pLAD occl, pCFX stent 80+% ISR, oRCA 99% then occl (L-R collats to dRCA), L-LAD ok with patent stent, S-CFX occl (old), S-RCA occl (old); PCI: LM ext into CFX with Xience Alpine DES  . Dyspnea    with exertion  . GERD (gastroesophageal reflux disease)   . Heart failure (HCC) systolic  . Hyperlipidemia   . Hyperlipoproteinemia   . Hypertension   . Ischemic cardiomyopathy    a. 2D ECHO: 08/02/2014; EF 45%; severe hypokinesis base/mid-inferolat segments and severe hypokinesis base inferior segment. Mild LVH, mild AS (may be underestimated due to LV dysfunction).  Mean gradient (S): 14 mm Hg. Peak gradient (S): 25 mm Hg. VTI ratio of LVOT to aortic valve: 0.37. Mod LA dilation. Mild RV systolic dysfxn     Tobacco History: Social History   Tobacco Use  Smoking Status Current Every Day Smoker  . Packs/day: 1.50  . Years: 62.00  . Pack years: 93.00  . Types: Cigarettes  Smokeless Tobacco Never Used   Ready to quit: No Counseling given: Yes   Outpatient Medications Prior to Visit  Medication Sig Dispense Refill  . amLODipine (NORVASC) 5 MG tablet TAKE 1 TABLET BY MOUTH ONCE DAILY 90 tablet 3  . atorvastatin (LIPITOR) 80 MG tablet TAKE 1 TABLET BY MOUTH ONCE DAILY. 90 tablet 3  . bisoprolol (ZEBETA) 5 MG tablet TAKE 1/2 TABLET BY MOUTH TWICE A DAY 90 tablet 3  . budesonide (PULMICORT) 0.25 MG/2ML nebulizer solution Take 2 mLs (0.25 mg total) by nebulization 2 (two) times daily. Dx: J44.9 120 mL 5  . clopidogrel (PLAVIX) 75 MG tablet TAKE 1 TABLET BY MOUTH  ONCE DAILY 90 tablet 3  . colchicine 0.6 MG tablet Take 0.6 mg by mouth as needed.    . fexofenadine (ALLEGRA) 180 MG tablet Take 180 mg by mouth daily.     . formoterol (PERFOROMIST) 20 MCG/2ML nebulizer solution Take 2 mLs (20 mcg total) by nebulization 2 (two) times daily. DX: J44.9 120 mL 5  . furosemide (LASIX) 40 MG tablet TAKE 1 TABLET BY MOUTH TWICE(2) DAILY 180 tablet 3  . isosorbide mononitrate (IMDUR) 60 MG 24 hr tablet TAKE ONE TABLET BY MOUTH TWICE DAILY 180 tablet 3  . nitroGLYCERIN (NITROSTAT) 0.4 MG SL tablet TAKE 1 TABLET UNDER THE TONGUE AS NEEDEDFOR CHEST PAIN ( MAY REPEAT EVERY 5 MINUTES X 3) 25 tablet 3  . pantoprazole (PROTONIX) 40 MG tablet TAKE 1 TABLET BY MOUTH ONCE DAILY 90 tablet 3  . potassium chloride SA (KLOR-CON) 20 MEQ tablet TAKE 1 TABLET BY MOUTH ONCE DAILY. 90 tablet 3  . spironolactone (ALDACTONE) 25 MG tablet TAKE 1/2 TABLET BY MOUTH TWICE A DAY 90 tablet 2  . amoxicillin (AMOXIL) 500 MG tablet Take 4 capsules (2,000 mg) one hour prior to all dental visits. 8 tablet 11  . aspirin EC 81 MG tablet Take 1 tablet (81 mg total) by mouth daily. 90 tablet 3   No facility-administered medications prior to visit.     Review of Systems:   Constitutional:   No  weight loss, night sweats,  Fevers, chills,  +fatigue, or  lassitude.  HEENT:   No headaches,  Difficulty swallowing,  Tooth/dental problems, or  Sore throat,                No sneezing, itching, ear ache, nasal congestion, post nasal drip,   CV:  No chest pain,  Orthopnea, PND, swelling in lower extremities, anasarca, dizziness, palpitations, syncope.   GI  No heartburn, indigestion, abdominal pain, nausea, vomiting, diarrhea, change in bowel habits, loss of appetite, bloody stools.   Resp   No excess mucus, no productive cough,  No non-productive cough,  No coughing up of blood.  No change in color of mucus.  No wheezing.  No chest wall deformity  Skin: no rash or lesions.  GU: no dysuria, change in  color of urine, no urgency or frequency.  No flank pain, no hematuria   MS:  No joint pain or swelling.  No decreased range of motion.  No back pain.    Physical Exam  BP 116/66 (BP Location: Left Arm, Cuff Size: Normal)   Pulse 62   Temp (!) 97.3 F (36.3 C) (Oral)   Ht 5\' 5"  (1.651 m)   Wt 138 lb 6.4 oz (62.8 kg)   SpO2 95%   BMI 23.03 kg/m   GEN: A/Ox3; pleasant , NAD, well nourished    HEENT:  [place]/AT,   ,  NOSE-clear, THROAT-clear, no lesions, no postnasal drip or exudate noted.   NECK:  Supple w/ fair ROM; no JVD; normal carotid impulses w/o bruits; no thyromegaly or nodules palpated; no lymphadenopathy.    RESP  Clear  P & A; w/o, wheezes/ rales/ or rhonchi. no accessory muscle use, no dullness to percussion  CARD:  RRR, no m/r/g, no peripheral edema, pulses intact, no cyanosis or clubbing.  GI:   Soft & nt; nml bowel sounds; no organomegaly or masses detected.   Musco: Warm bil, no deformities or joint swelling noted.   Neuro: alert, no focal deficits noted.    Skin: Warm, no lesions or rashes    Lab Results:   BNP Imaging: No results found.    PFT Results Latest Ref Rng & Units 06/27/2015  FVC-Pre L 2.66  FVC-Predicted Pre % 71  FVC-Post L 2.75  FVC-Predicted Post % 73  Pre FEV1/FVC % % 66  Post FEV1/FCV % % 67  FEV1-Pre L 1.76  FEV1-Predicted Pre % 64  FEV1-Post L 1.85  DLCO UNC% % 62  DLCO COR %Predicted % 82  TLC L 5.42  TLC % Predicted % 87  RV % Predicted % 117    No results found for: NITRICOXIDE      Assessment & Plan:   COPD GOLD II/ still smoking  Compensated on present regimen  Smoking cessation encouraged   Plan  Patient Instructions  Work on cutting back to quitting on smoking .  Continue on Pulmicort and Perforomist Neb Twice daily.  Ventolin Inhaler 2 puffs every 4hrs as needed for wheezing/shortness of breath .   Mucinex DM Twice daily  As needed  Cough/congestion .  Activity as tolerated.  Follow up with Dr.  Hermina Staggers  or Thedore Pickel NP in 6 months and As needed   Please contact office for sooner follow up if symptoms do not improve or worsen or seek emergency care         Cigarette smoker Smoking cessation discussed      Rexene Edison, NP 05/09/2020

## 2020-05-09 NOTE — Patient Instructions (Addendum)
Work on cutting back to quitting on smoking .  Continue on Pulmicort and Perforomist Neb Twice daily.  Ventolin Inhaler 2 puffs every 4hrs as needed for wheezing/shortness of breath .   Mucinex DM Twice daily  As needed  Cough/congestion .  Activity as tolerated.  Follow up with Dr. Hermina Staggers  or Teila Skalsky NP in 6 months and As needed   Please contact office for sooner follow up if symptoms do not improve or worsen or seek emergency care

## 2020-05-15 ENCOUNTER — Telehealth: Payer: Self-pay

## 2020-05-15 DIAGNOSIS — R93429 Abnormal radiologic findings on diagnostic imaging of unspecified kidney: Secondary | ICD-10-CM

## 2020-05-15 DIAGNOSIS — N289 Disorder of kidney and ureter, unspecified: Secondary | ICD-10-CM

## 2020-05-15 DIAGNOSIS — Z952 Presence of prosthetic heart valve: Secondary | ICD-10-CM

## 2020-05-15 DIAGNOSIS — I350 Nonrheumatic aortic (valve) stenosis: Secondary | ICD-10-CM

## 2020-05-15 NOTE — Telephone Encounter (Signed)
  [facility]   MULTIDISCIPLINARY HEART VALVE TEAM  The pt contacted the office to arrange 1 year TAVR follow-up.  OV with Nell Range PA-C and Echocardiogram arranged.  The pt also has decided that he would like to have MRI to further evaluate renal lesions. The pt would like to have this test performed at [place].  I will place order for this test and the pt will have an updated BMP performed in [place] at local [place]. Pt agreed with plan and will contact me if he is not contacted by [facility] to arrange MRI.   Per 10/04/2019 OV with Nell Range PA-C:   Incidental findings:   Several indeterminate renal cortical lesions in both kidneys, largest 2.8 cm in the anterior upper right kidney, mildly increased in size. MRI (preferred) or CT abdomen without and with IV contrast recommended for further characterization, to exclude renal cell carcinoma. We have discussed this and he is adamant that does want to to get a scan at this time and if he changes his mind he will let us know.

## 2020-05-16 DIAGNOSIS — N289 Disorder of kidney and ureter, unspecified: Secondary | ICD-10-CM | POA: Diagnosis not present

## 2020-05-16 DIAGNOSIS — R93429 Abnormal radiologic findings on diagnostic imaging of unspecified kidney: Secondary | ICD-10-CM | POA: Diagnosis not present

## 2020-05-17 LAB — BASIC METABOLIC PANEL
BUN/Creatinine Ratio: 14 (ref 10–24)
BUN: 20 mg/dL (ref 8–27)
CO2: 23 mmol/L (ref 20–29)
Calcium: 9.2 mg/dL (ref 8.6–10.2)
Chloride: 98 mmol/L (ref 96–106)
Creatinine, Ser: 1.41 mg/dL — ABNORMAL HIGH (ref 0.76–1.27)
GFR calc Af Amer: 56 mL/min/{1.73_m2} — ABNORMAL LOW (ref >59)
GFR calc non Af Amer: 48 mL/min/{1.73_m2} — ABNORMAL LOW (ref >59)
Glucose: 110 mg/dL — ABNORMAL HIGH (ref 65–99)
Potassium: 4.4 mmol/L (ref 3.5–5.2)
Sodium: 138 mmol/L (ref 134–144)

## 2020-05-29 DIAGNOSIS — L578 Other skin changes due to chronic exposure to nonionizing radiation: Secondary | ICD-10-CM | POA: Diagnosis not present

## 2020-06-03 ENCOUNTER — Ambulatory Visit
Admission: RE | Admit: 2020-06-03 | Discharge: 2020-06-03 | Disposition: A | Discharge status: Home / Self Care | Payer: Medicare Other | Source: Ambulatory Visit | Attending: Physician Assistant | Admitting: Physician Assistant

## 2020-06-03 ENCOUNTER — Other Ambulatory Visit: Payer: Self-pay

## 2020-06-03 ENCOUNTER — Telehealth: Payer: Self-pay | Admitting: Physician Assistant

## 2020-06-03 DIAGNOSIS — N281 Cyst of kidney, acquired: Secondary | ICD-10-CM | POA: Diagnosis not present

## 2020-06-03 DIAGNOSIS — K8020 Calculus of gallbladder without cholecystitis without obstruction: Secondary | ICD-10-CM | POA: Diagnosis not present

## 2020-06-03 DIAGNOSIS — K869 Disease of pancreas, unspecified: Secondary | ICD-10-CM

## 2020-06-03 DIAGNOSIS — R9389 Abnormal findings on diagnostic imaging of other specified body structures: Secondary | ICD-10-CM

## 2020-06-03 DIAGNOSIS — K8689 Other specified diseases of pancreas: Secondary | ICD-10-CM | POA: Diagnosis not present

## 2020-06-03 DIAGNOSIS — N289 Disorder of kidney and ureter, unspecified: Secondary | ICD-10-CM

## 2020-06-03 DIAGNOSIS — R93429 Abnormal radiologic findings on diagnostic imaging of unspecified kidney: Secondary | ICD-10-CM

## 2020-06-03 MED ORDER — GADOBENATE DIMEGLUMINE 529 MG/ML IV SOLN
12.0000 mL | Freq: Once | INTRAVENOUS | Status: AC | PRN
  Administered 2020-06-03: 12 mL via INTRAVENOUS

## 2020-06-03 NOTE — Telephone Encounter (Signed)
Malachy Mood from [facility] calling to report:  MRI Abdomen today showing new 3.7 cm pancreatic head lesion highly concerning for malignant pancreatic adenocarcinoma. GI referral and sampling recommended. Nell Range, PA called and made aware.

## 2020-06-03 NOTE — Telephone Encounter (Signed)
Left message for pt to contact me to discuss results of MRI.

## 2020-06-03 NOTE — Telephone Encounter (Signed)
[facility] calling to give a call report.

## 2020-06-03 NOTE — Telephone Encounter (Signed)
We will call him and get him into GI

## 2020-06-04 ENCOUNTER — Telehealth: Payer: Self-pay | Admitting: Gastroenterology

## 2020-06-04 IMAGING — CT CT ANGIO CHEST
2 of 16 series · 16 of 37 positions shown · IV contrast (APPLIED)
Comparison: 02/02/2017 chest CT angiogram. 06/06/2018 CT
abdomen/pelvis.

CLINICAL DATA: Severe symptomatic aortic stenosis. Pre-TAVR
evaluation.

EXAM:
CT ANGIOGRAPHY CHEST, ABDOMEN AND PELVIS
TECHNIQUE: Multidetector CT imaging through the chest, abdomen and pelvis was
performed using the standard protocol during bolus administration of
intravenous contrast. Multiplanar reconstructed images and MIPs were
obtained and reviewed to evaluate the vascular anatomy.
CONTRAST:  100mL OMNIPAQUE IOHEXOL 350 MG/ML SOLN

[Series 9: 0-90% · axial · 0.39mm/px · z∈[+1148,+1321]mm · 8 of 3710 slices shown]
[im 413/3710  lung]
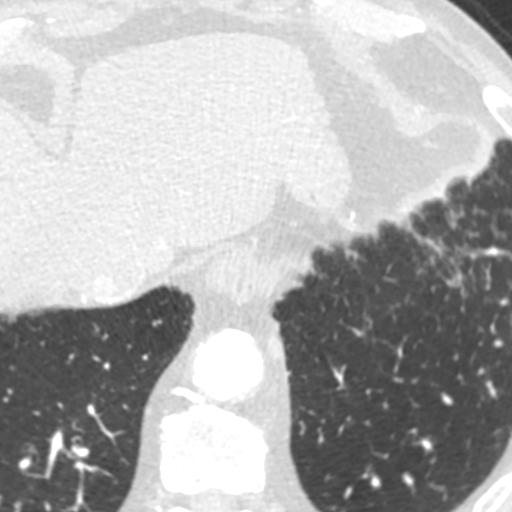
[im 825/3710  mediastinal]
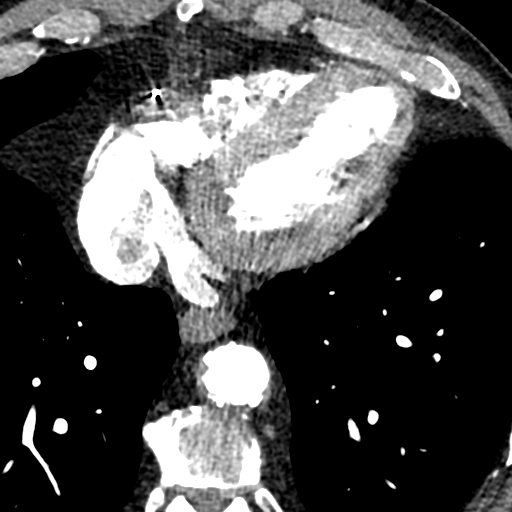
[im 1237/3710  lung]
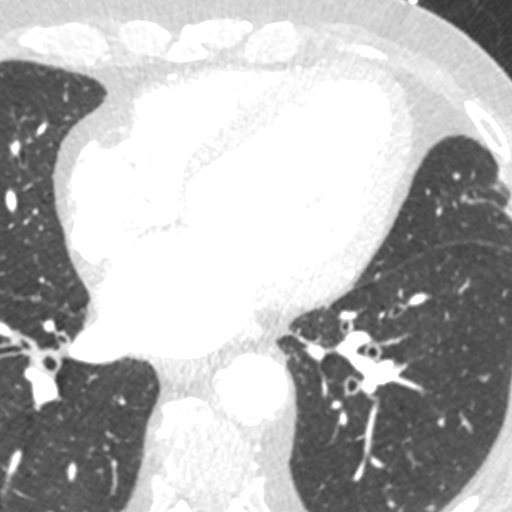
[im 1649/3710  mediastinal]
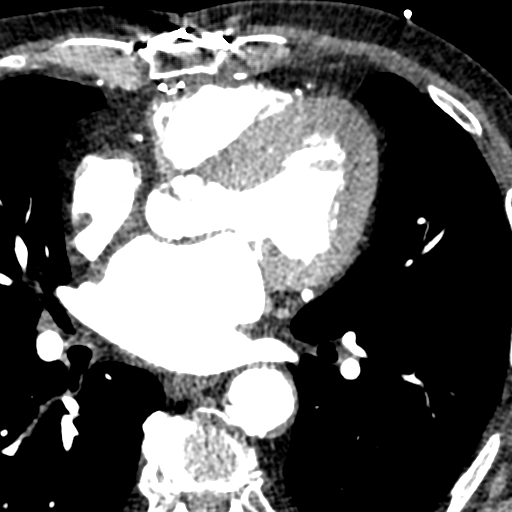
[im 2061/3710  lung]
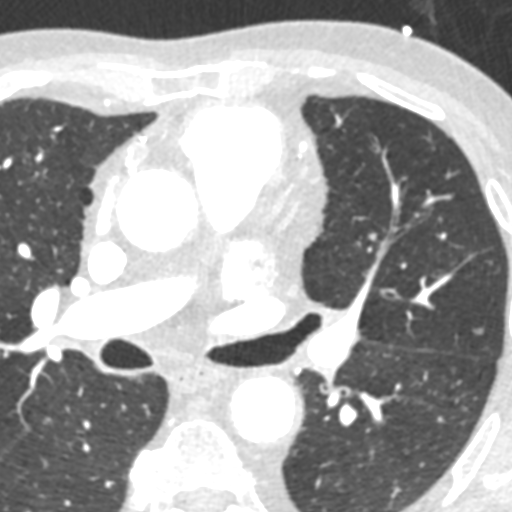
[im 2473/3710  mediastinal]
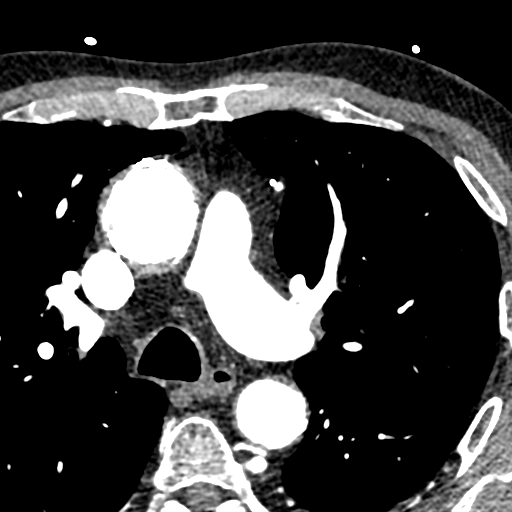
[im 2885/3710  lung]
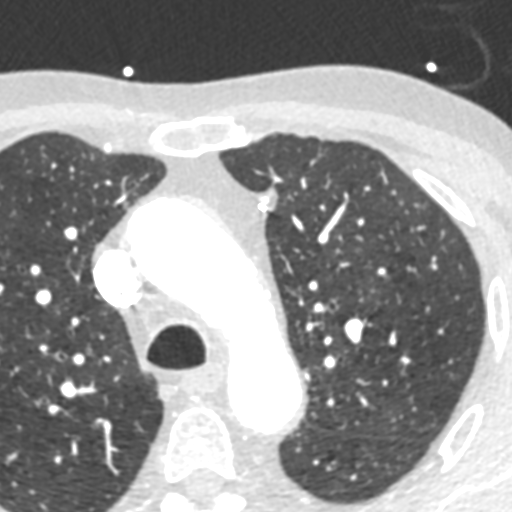
[im 3297/3710  mediastinal]
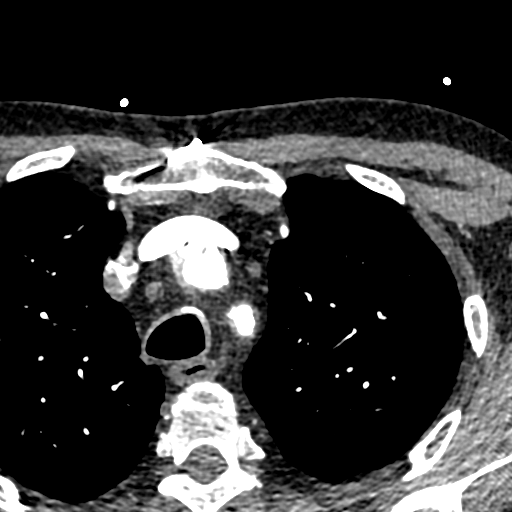

[Series 10: 5-95% · axial · 0.39mm/px · z∈[+1148,+1321]mm · 8 of 3710 slices shown]
[im 413/3710  lung]
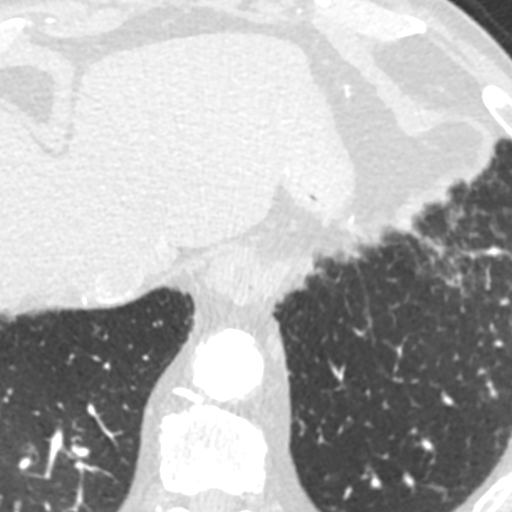
[im 825/3710  lung]
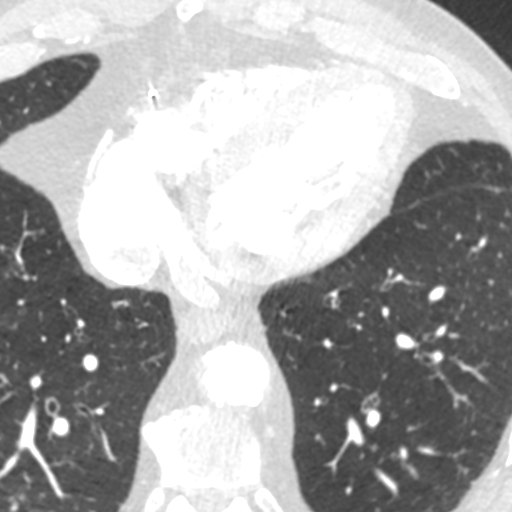
[im 1237/3710  lung]
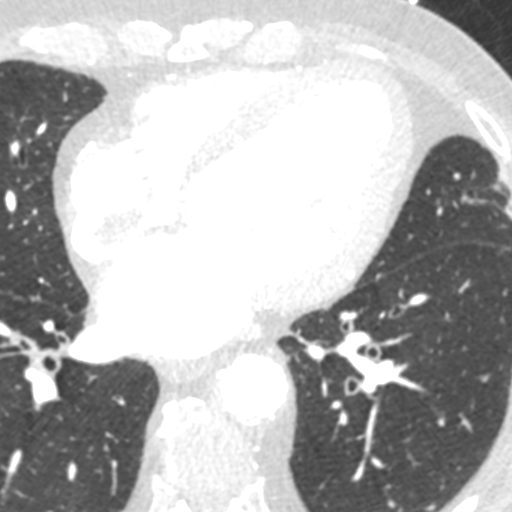
[im 1649/3710  lung]
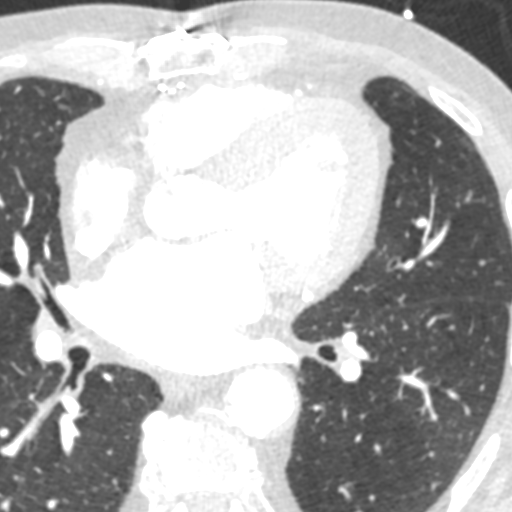
[im 2061/3710  lung]
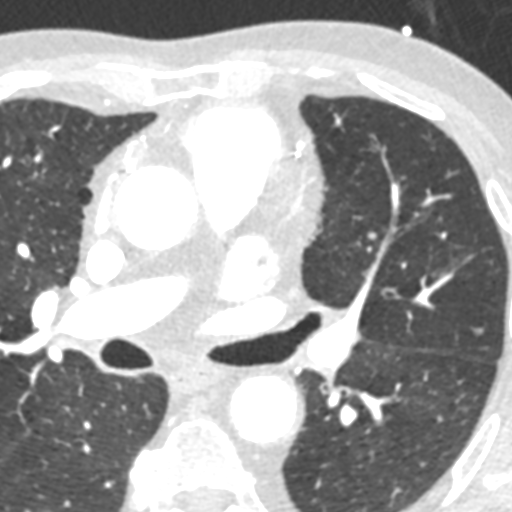
[im 2473/3710  lung]
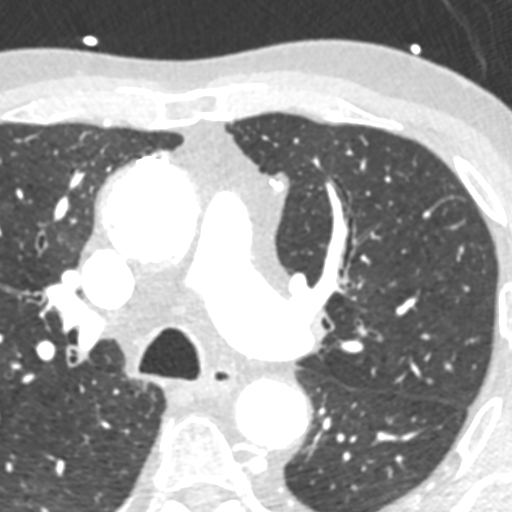
[im 2885/3710  lung]
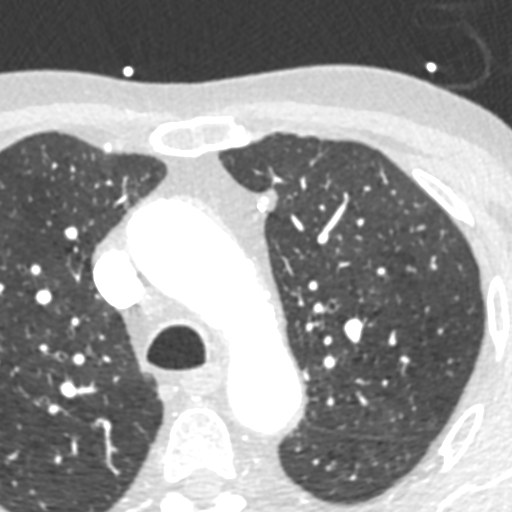
[im 3297/3710  lung]
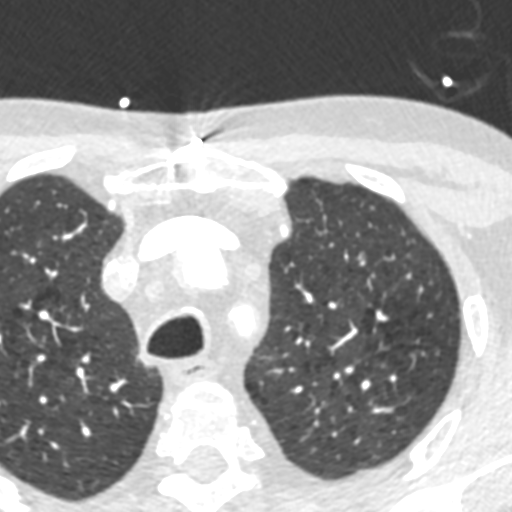

[16 of 37 positions shown; findings below may reference images not displayed]

FINDINGS: CTA CHEST FINDINGS

Cardiovascular: Mild cardiomegaly. Diffuse thickening and coarse
calcification of the aortic valve. No significant pericardial
effusion/thickening. Left main and 3 vessel coronary atherosclerosis
status post CABG. Atherosclerotic nonaneurysmal thoracic aorta,
including severe atherosclerotic calcification of the ascending
thoracic aorta. Normal caliber pulmonary arteries. No central
pulmonary emboli.

Mediastinum/Nodes: No discrete thyroid nodules. Unremarkable
esophagus. No pathologically enlarged axillary, mediastinal or hilar
lymph nodes.

Lungs/Pleura: No pneumothorax. No pleural effusion. Mild
centrilobular and paraseptal emphysema with diffuse bronchial wall
thickening. No acute consolidative airspace disease or lung masses.
No significant pulmonary nodules.

Musculoskeletal: No aggressive appearing focal osseous lesions.
Intact sternotomy wires. Mild thoracic spondylosis.

CTA ABDOMEN AND PELVIS FINDINGS

Hepatobiliary: Normal liver size. No liver masses. Stable
granulomatous inferior right liver calcification. Cholelithiasis. No
biliary ductal dilatation.

Pancreas: Normal, with no mass or duct dilation.

Spleen: Normal size. No mass.

Adrenals/Urinary Tract: Stable diffuse bilateral adrenal thickening
without discrete adrenal nodules, suggesting adrenal hyperplasia. No
hydronephrosis. Several indeterminate renal cortical lesions in both
kidneys, largest in the right kidney measuring 2.8 cm in the
anterior upper right kidney with density 49 HU (series 14/image
371), increased from 2.4 cm on 06/05/2018 CT, and largest in the
left kidney measuring 1.7 cm in the interpolar left kidney with
density 83 HU (series 14/image 381), increased from 1.4 cm. Several
subcentimeter hypodense renal cortical lesions in both kidneys are
too small to characterize. Stable septated 5.7 cm Bosniak category 2
renal cyst in the lateral upper right kidney. Stable chronic mild
diffuse bladder wall thickening without significant bladder
distention.

Stomach/Bowel: Small hiatal hernia. Otherwise normal nondistended
stomach normal caliber small bowel with no small bowel wall
thickening. Normal appendix. Marked sigmoid diverticulosis, with no
large bowel wall thickening or significant pericolonic fat
stranding.

Vascular/Lymphatic: Atherosclerotic abdominal aorta with stable
cm infrarenal abdominal aortic aneurysm. Stable aneurysmal bilateral
common iliac arteries measuring 2.8 cm diameter on the left and
cm diameter on the right. No pathologically enlarged lymph nodes in
the abdomen or pelvis.

Reproductive: Mild prostatomegaly.

Other: No pneumoperitoneum, ascites or focal fluid collection.

Musculoskeletal: No aggressive appearing focal osseous lesions.
Moderate lumbar spondylosis.

VASCULAR MEASUREMENTS PERTINENT TO TAVR:

AORTA:

Minimal Aortic 3iameter-WE.O x 11.2 mm

Severity of Aortic Calcification-severe

RIGHT PELVIS:

Right Common Iliac Artery -

Minimal Riameter-2.L x 5.5 mm

Tortuosity-mild

Calcification-severe

Right External Iliac Artery -

Minimal Fiameter-Q.H x 4.3 mm

Tortuosity-mild-to-moderate

Calcification-moderate to severe

Right Common Femoral Artery -

Minimal Niameter-K.K x 4.2 mm

Tortuosity-mild

Calcification-severe

LEFT PELVIS:

Left Common Iliac Artery -

Minimal 8iameter-Y.7 x 9.1 mm

Tortuosity-mild

Calcification-severe

Left External Iliac Artery -

Minimal Piameter-N.V x 2.8 mm

Tortuosity-mild-to-moderate

Calcification-severe

Left Common Femoral Artery -

Minimal 1iameter-N.4 x 4.1 mm

Tortuosity-mild

Calcification-severe

Review of the MIP images confirms the above findings.
IMPRESSION: 1. Vascular findings and measurements pertinent to potential TAVR
procedure, as detailed. Note is made of diminutive external iliac
arteries bilaterally and diffuse severe atherosclerotic
calcification including along the ascending thoracic aorta.
2. Marked thickening and calcification of the aortic valve,
compatible with the reported history of symptomatic severe aortic
stenosis.
3. Mild cardiomegaly.
4. Several indeterminate renal cortical lesions in both kidneys,
largest 2.8 cm in the anterior upper right kidney, mildly increased
in size. MRI (preferred) or CT abdomen without and with IV contrast
recommended for further characterization, to exclude renal cell
carcinoma.
5. Infrarenal 3.2 cm Abdominal Aortic Aneurysm (14G14-ESK.S).
Recommend follow-up aortic ultrasound in 3 years. This
recommendation follows ACR consensus guidelines: White Paper of the
ACR Incidental Findings Committee II on Vascular Findings. [HOSPITAL] 5788; [DATE].
6. Bilateral common iliac artery aneurysms, stable, 2.8 cm on the
left and 1.9 cm on the right.
7. Chronic findings include: Aortic Atherosclerosis (14G14-WAA.A)
and Emphysema (14G14-37J.B). Cholelithiasis. Small hiatal hernia.
Marked sigmoid diverticulosis. Mild prostatomegaly.

## 2020-06-04 NOTE — Telephone Encounter (Signed)
I spoke with the pt and made him aware of MRI results.  Referral to GI will placed in Epic and I will also call GI in regards to urgency of evaluation.

## 2020-06-04 NOTE — Telephone Encounter (Signed)
I spoke with [facility] and the pt has been seen by Dr Lyndel Safe.  The office will arrange evaluation and contact the pt for apt.

## 2020-06-04 NOTE — Telephone Encounter (Signed)
Patient is scheduled for 06/16/20 at 8:30 am with Dr Lyndel Safe

## 2020-06-04 NOTE — Telephone Encounter (Signed)
You can see if he is available 06/17/20 at 8:30. It will be a over book.

## 2020-06-04 NOTE — Telephone Encounter (Signed)
Left message for pt to contact me to discuss results of MRI.

## 2020-06-11 ENCOUNTER — Telehealth: Payer: Self-pay | Admitting: Gastroenterology

## 2020-06-11 NOTE — Telephone Encounter (Signed)
Patient is calling, he states the appointment he agreeded to on 8/30 he can not do due to being 1 1/2 away from office. asking if there is any way he can have an afternoon appointment. This was an appt you doubled booked to get patient in asap. Asked to speak with you.

## 2020-06-11 NOTE — Telephone Encounter (Signed)
Spoke to patient. Moved his appointment to 06/25/20 at 9:50 am Patient agreed to this time

## 2020-06-25 ENCOUNTER — Ambulatory Visit (INDEPENDENT_AMBULATORY_CARE_PROVIDER_SITE_OTHER): Payer: Medicare Other | Admitting: Gastroenterology

## 2020-06-25 VITALS — BP 138/74 | HR 61 | Temp 97.3°F | Ht 65.0 in | Wt 138.1 lb

## 2020-06-25 DIAGNOSIS — K8689 Other specified diseases of pancreas: Secondary | ICD-10-CM | POA: Diagnosis not present

## 2020-06-25 NOTE — Progress Notes (Signed)
Chief Complaint:   Referring Provider:  Townsend Roger, MD      ASSESSMENT AND PLAN;   #1.  Newly incidentally Dx HOP Mass on MRI abdo 06/03/2020. Refuses EUS or any further WU. He understands the risks and benefits.   #2. Multiple comorbid conditions: severe CAD s/p CABG s/p multiple PCI's (not a candidate for redo CABG) on ASA/Plavix, stable angina, hyperlipidemia, hypertension, ischemic cardiomyopathy (EF 30-35%), severe COPD, dCHF, aortic stenosis s/p TAVR, PVD.   Plan: -Doesnot want any CA-19/CEA/EUS or further WU of panc mass. Very adamant. Just wants to have quality rather than quantitiy of life. I have told him that we are here to help in any way. He should let us know if he changes his mind or starts having any problems. Expect this mass to grow/can cause jaundice/weight loss and could potentially be fatal. -Recommend checking LFTs at next scheduled blood draw. -FU as needed.   HPI:    Steve Durham is a 74 y.o. male  Very proud gentleman whom I know from [place] over several years.  Refused endoscopic procedures in the past.  Found to have new 3.7 cm pancreatic head mass on MRI performed for follow-up of renal lesions.  He has been advised EUS with Bx for further evaluation.  He comes to the GI clinic.  He adamantly refuses to have any endoscopic procedures/biopsies/even blood tests for CA 19-9 and CEA.  He does not want to go through any chemo/radiation/surgery.  I have shown Steve Durham the MRI films as well.  He does have advanced moderate to severe COPD and is short of breath even while talking.  He continues to smoke.  Has extensive cardiac history including  severe CAD s/p CABG s/p multiple PCI's (not a candidate for redo CABG) on ASA/Plavix, stable angina, hyperlipidemia, hypertension, ischemic cardiomyopathy (EF 30-35%), dCHF, aortic stenosis s/p TAVR, PVD.   From GI standpoint he has occasional heartburn (Dx with small HH on barium swallow in past) on Protonix.  No  odynophagia or dysphagia.  He denies having any nausea, vomiting.  He denies having any significant diarrhea or constipation.  No melena or hematochezia.  No weight loss.  No jaundice dark urine or pale stools.   Past Medical History:  Diagnosis Date  . Abnormal CT scan, kidney    07/2012 - multiple small cysts  . Aortic stenosis    a. Mod AS/AI by cath 07/2012.;  b.  Echo (07/31/13) is: EF 55-60%, mild aortic stenosis (mean gradient 13);  c. Echo (5/15):  EF 40-45%, mild AS (mean 12 mmHg), mod AI  . Back pain    resolved per patient 08/31/19  . CAD (coronary artery disease)   . Carotid artery occlusion    a. Dopplers 07/2012: no significant high grade obstruction.  . Cholelithiasis    Seen on CT 07/2012  . COPD (chronic obstructive pulmonary disease) ([place])   . Coronary artery disease    a. CABG 10/1991. b. cath 07/2012 with prog dsz, turned down for re-do CABG - for med rx for now.; c. NSTEMI/PCI: DES to mLAD ext into IMA graft;  d. Inf STEMI (5/15):  LM 60-70% then 99% before LAD, pLAD occl, pCFX stent 80+% ISR, oRCA 99% then occl (L-R collats to dRCA), L-LAD ok with patent stent, S-CFX occl (old), S-RCA occl (old); PCI: LM ext into CFX with Xience Alpine DES  . Dysphagia   . Dyspnea    with exertion  . GERD (gastroesophageal reflux disease)   .  Heart failure (HCC) systolic  . Hyperlipidemia   . Hyperlipoproteinemia   . Hypertension   . Ischemic cardiomyopathy    a. 2D ECHO: 08/02/2014; EF 45%; severe hypokinesis base/mid-inferolat segments and severe hypokinesis base inferior segment. Mild LVH, mild AS (may be underestimated due to LV dysfunction).  Mean gradient (S): 14 mm Hg. Peak gradient (S): 25 mm Hg. VTI ratio of LVOT to aortic valve: 0.37. Mod LA dilation. Mild RV systolic dysfxn     Past Surgical History:  Procedure Laterality Date  . CARDIAC CATHETERIZATION  04/03/99  . CARDIAC CATHETERIZATION N/A 03/20/2015   Procedure: Left Heart Cath and Cors/Grafts Angiography;   Surgeon: Jettie Booze, MD;  Location: [facility];  Service: Cardiovascular;  Laterality: N/A;  . CARDIAC CATHETERIZATION N/A 04/28/2016   Procedure: Right/Left Heart Cath and Coronary/Graft Angiography;  Surgeon: Burnell Blanks, MD;  Location: [facility];  Service: Cardiovascular;  Laterality: N/A;  . CARPAL TUNNEL RELEASE  2007   Excision mass dorsal left wrist  . CORONARY ARTERY BYPASS GRAFT  1993  . CORONARY ARTERY BYPASS GRAFT     [phone]  . FASCIOTOMY Right 07/30/2013   Procedure: OPEN FASCIOTOMY RIGHT RING FINGER & RIGHT SMALL FINGER MULTIPLE LEVELS;  Surgeon: Wynonia Sours, MD;  Location: [facility] [facility];  Service: Orthopedics;  Laterality: Right;  . HERNIA REPAIR  1988  . INGUINAL HERNIA REPAIR Left 06/07/2018   Procedure: LEFT INGUINAL HERNIA REPAIR;  Surgeon: [name], MD;  Location: [facility];  Service: General;  Laterality: Left;  . INSERTION OF MESH Left 06/07/2018   Procedure: INSERTION OF MESH;  Surgeon: [name], MD;  Location: [place] [facility];  Service: General;  Laterality: Left;  . LEFT HEART CATHETERIZATION WITH CORONARY ANGIOGRAM N/A 02/28/2014   Procedure: LEFT HEART CATHETERIZATION WITH CORONARY ANGIOGRAM;  Surgeon: Troy Sine, MD;  Location: [facility] CATH LAB;  Service: Cardiovascular;  Laterality: N/A;  . LEFT HEART CATHETERIZATION WITH CORONARY/GRAFT ANGIOGRAM N/A 08/10/2012   Procedure: LEFT HEART CATHETERIZATION WITH Beatrix Fetters;  Surgeon: Hillary Bow, MD;  Location: [facility] CATH LAB;  Service: Cardiovascular;  Laterality: N/A;  . LEFT HEART CATHETERIZATION WITH CORONARY/GRAFT ANGIOGRAM N/A 08/01/2014   Procedure: LEFT HEART CATHETERIZATION WITH Beatrix Fetters;  Surgeon: Leonie Man, MD;  Location: [facility] CATH LAB;  Service: Cardiovascular;  Laterality: N/A;  . LEFT HEART CATHETERIZATION WITH CORONARY/GRAFT ANGIOGRAM N/A 02/12/2015   Procedure: LEFT HEART CATHETERIZATION WITH Beatrix Fetters;  Surgeon:  Burnell Blanks, MD;  Location: [facility] CATH LAB;  Service: Cardiovascular;  Laterality: N/A;  . PERCUTANEOUS CORONARY STENT INTERVENTION (PCI-S)  07/31/2013   Procedure: PERCUTANEOUS CORONARY STENT INTERVENTION (PCI-S);  Surgeon: Burnell Blanks, MD;  Location: [facility] CATH LAB;  Service: Cardiovascular;;  LIMA to LAD at the anastomosis with aortic root shot   . RIGHT/LEFT HEART CATH AND CORONARY/GRAFT ANGIOGRAPHY N/A 04/04/2018   Procedure: RIGHT/LEFT HEART CATH AND CORONARY/GRAFT ANGIOGRAPHY;  Surgeon: Martinique, Peter M, MD;  Location: [facility];  Service: Cardiovascular;  Laterality: N/A;  . RIGHT/LEFT HEART CATH AND CORONARY/GRAFT ANGIOGRAPHY N/A 08/01/2019   Procedure: RIGHT/LEFT HEART CATH AND CORONARY/GRAFT ANGIOGRAPHY;  Surgeon: Burnell Blanks, MD;  Location: [facility];  Service: Cardiovascular;  Laterality: N/A;  . TEE WITHOUT CARDIOVERSION N/A 09/04/2019   Procedure: TRANSESOPHAGEAL ECHOCARDIOGRAM (TEE);  Surgeon: Burnell Blanks, MD;  Location: [place];  Service: Open Heart Surgery;  Laterality: N/A;  . ULTRASOUND GUIDANCE FOR VASCULAR ACCESS Right 09/04/2019   Procedure:  Ultrasound Guidance For Vascular Access;  Surgeon: Burnell Blanks, MD;  Location: [facility];  Service: Open Heart Surgery;  Laterality: Right;    Family History  Problem Relation Age of Onset  . Heart attack Mother        died @ 57.  Marland Kitchen Hypertension Mother   . Hypertension Father   . Hypertension Sister   . Emphysema Brother   . Hypertension Brother   . Allergies Sister   . Stroke Neg Hx   . Colon cancer Neg Hx   . Esophageal cancer Neg Hx     Social History   Tobacco Use  . Smoking status: Current Every Day Smoker    Packs/day: 1.50    Years: 62.00    Pack years: 93.00    Types: Cigarettes  . Smokeless tobacco: Never Used  Vaping Use  . Vaping Use: Never used  Substance Use Topics  . Alcohol use: Yes    Alcohol/week: 14.0 standard drinks    Types: 14 Cans  of beer per week    Comment: beer once in awhile  . Drug use: No    Current Outpatient Medications  Medication Sig Dispense Refill  . amLODipine (NORVASC) 5 MG tablet TAKE 1 TABLET BY MOUTH ONCE DAILY 90 tablet 3  . atorvastatin (LIPITOR) 80 MG tablet TAKE 1 TABLET BY MOUTH ONCE DAILY. 90 tablet 3  . bisoprolol (ZEBETA) 5 MG tablet TAKE 1/2 TABLET BY MOUTH TWICE A DAY 90 tablet 3  . budesonide (PULMICORT) 0.25 MG/2ML nebulizer solution Take 2 mLs (0.25 mg total) by nebulization 2 (two) times daily. Dx: J44.9 120 mL 5  . clopidogrel (PLAVIX) 75 MG tablet TAKE 1 TABLET BY MOUTH ONCE DAILY 90 tablet 3  . colchicine 0.6 MG tablet Take 0.6 mg by mouth as needed.    . fexofenadine (ALLEGRA) 180 MG tablet Take 180 mg by mouth daily.     . formoterol (PERFOROMIST) 20 MCG/2ML nebulizer solution Take 2 mLs (20 mcg total) by nebulization 2 (two) times daily. DX: J44.9 120 mL 5  . furosemide (LASIX) 40 MG tablet TAKE 1 TABLET BY MOUTH TWICE(2) DAILY 180 tablet 3  . isosorbide mononitrate (IMDUR) 60 MG 24 hr tablet TAKE ONE TABLET BY MOUTH TWICE DAILY 180 tablet 3  . nitroGLYCERIN (NITROSTAT) 0.4 MG SL tablet TAKE 1 TABLET UNDER THE TONGUE AS NEEDEDFOR CHEST PAIN ( MAY REPEAT EVERY 5 MINUTES X 3) 25 tablet 3  . pantoprazole (PROTONIX) 40 MG tablet TAKE 1 TABLET BY MOUTH ONCE DAILY 90 tablet 3  . potassium chloride SA (KLOR-CON) 20 MEQ tablet TAKE 1 TABLET BY MOUTH ONCE DAILY. 90 tablet 3  . Simethicone (GAS-X PO) Take 1 tablet by mouth daily.    Marland Kitchen spironolactone (ALDACTONE) 25 MG tablet TAKE 1/2 TABLET BY MOUTH TWICE A DAY 90 tablet 2   No current facility-administered medications for this visit.    No Known Allergies  Review of Systems:  Constitutional: Denies fever, chills, diaphoresis, appetite change and fatigue.  HEENT: Denies photophobia, eye pain, redness, hearing loss, ear pain, congestion, sore throat, rhinorrhea, sneezing, mouth sores, neck pain, neck stiffness and tinnitus.     Respiratory: Has SOB, DOE, cough, chest tightness,  and wheezing.   Cardiovascular: Denies chest pain, palpitations and leg swelling.  Genitourinary: Denies dysuria, urgency, frequency, hematuria, flank pain and difficulty urinating.  Musculoskeletal: Denies myalgias, has back pain, joint swelling, arthralgias and gait problem.  Skin: Has easy bruisability Neurological: Denies dizziness, seizures, syncope, weakness, light-headedness,  numbness and headaches.  Hematological: Denies adenopathy. Easy bruising, personal or family bleeding history  Psychiatric/Behavioral: No anxiety or depression     Physical Exam:    BP 138/74   Pulse 61   Temp (!) 97.3 F (36.3 C)   Ht 5\' 5"  (1.651 m)   Wt 138 lb 2 oz (62.7 kg)   BMI 22.99 kg/m  Wt Readings from Last 3 Encounters:  06/25/20 138 lb 2 oz (62.7 kg)  05/09/20 138 lb 6.4 oz (62.8 kg)  02/01/20 144 lb (65.3 kg)   Constitutional: Frail.  Gets short of breath even while talking. Psychiatric: Normal mood and affect. Behavior is normal. HEENT: Pupils normal.  Conjunctivae are normal. No scleral icterus. Neck supple.  Cardiovascular: Normal rate, regular rhythm. No edema Pulmonary/chest: Bilateral decreased breath sounds with occasional wheezing. Abdominal: Soft, nondistended. Nontender. Bowel sounds active throughout. There are no masses palpable. No hepatomegaly. Rectal:  defered Neurological: Alert and oriented to person place and time. Skin: Skin is warm and dry. No rashes noted.  Data Reviewed: I have personally reviewed following labs and imaging studies  CBC: CBC Latest Ref Rng & Units 09/05/2019 09/04/2019 09/04/2019  WBC 4.0 - 10.5 K/uL 13.5(H) - -  Hemoglobin 13.0 - 17.0 g/dL 13.3 12.9(L) 13.6  Hematocrit 39 - 52 % 40.0 38.0(L) 40.0  Platelets 150 - 400 K/uL 152 - -    CMP: CMP Latest Ref Rng & Units 05/16/2020 09/05/2019 09/04/2019  Glucose 65 - 99 mg/dL 110(H) 171(H) 112(H)  BUN 8 - 27 mg/dL 20 20 23   Creatinine 0.76  - 1.27 mg/dL 1.41(H) 1.18 1.20  Sodium 134 - 144 mmol/L 138 136 139  Potassium 3.5 - 5.2 mmol/L 4.4 3.9 4.6  Chloride 96 - 106 mmol/L 98 106 105  CO2 20 - 29 mmol/L 23 21(L) -  Calcium 8.6 - 10.2 mg/dL 9.2 8.6(L) -  Total Protein 6.5 - 8.1 g/dL - - -  Total Bilirubin 0.3 - 1.2 mg/dL - - -  Alkaline Phos 38 - 126 U/L - - -  AST 15 - 41 U/L - - -  ALT 0 - 44 U/L - - -      Radiology Studies: MR ABDOMEN WWO CONTRAST  Result Date: 06/03/2020 CLINICAL DATA:  Indeterminate renal lesions on CT of 08/21/2019, for further characterization. EXAM: MRI ABDOMEN WITHOUT AND WITH CONTRAST TECHNIQUE: Multiplanar multisequence MR imaging of the abdomen was performed both before and after the administration of intravenous contrast. CONTRAST:  29mL MULTIHANCE GADOBENATE DIMEGLUMINE 529 MG/ML IV SOLN COMPARISON:  08/21/2019 FINDINGS: Lower chest: Unremarkable Hepatobiliary: Small gallstones in the gallbladder. No biliary dilatation. Diffuse hepatic steatosis. Pancreas: Late arterial phase hypoenhancing lesion in the pancreatic head is observed with associated restriction of diffusion on high B value images raising substantial concern for new pancreatic adenocarcinoma. While it is conceivable that this could represent focal pancreatitis or a different type of lesion, the lesion has the following characteristics: Size: 3.7 by 2.8 by 2.9 cm. Location: Pancreatic head Characterization: Solid Enhancement: Initially primarily hypoenhancing but with delayed accentuated enhancement especially marginally. Other Characteristics: This lesion primarily is to the right of the common bile duct and dorsal pancreatic duct. Local extent of mass: No definite extra pancreatic extension Vascular Involvement: None observed Variant hepatic artery anatomy: The right hepatic artery arises from the superior mesenteric artery. The left hepatic artery arises from the celiac trunk. There appears to be an aortic origin for the right gastric  artery. Bile Duct Involvement: The CBD skirts  along the left side of the mass. Variant biliary anatomy: Not identified Adjacent Nodes: Absent Omental/Peritoneal Disease: Not observed Distant Metastases: Not observed Spleen:  Unremarkable Adrenals/Urinary Tract:  The adrenal glands appear normal. Multiple complex bilateral renal lesions are all compatible with Bosniak category 2 cysts, no abnormal enhancing renal lesion is identified. Stomach/Bowel: Descending and sigmoid colon diverticulosis. Vascular/Lymphatic: Abdominal aortic atherosclerosis. Infrarenal abdominal aortic aneurysm 3.1 cm in diameter. Atheromatous narrowing of the right common iliac artery with some mild posted not dilatation. Other:  No supplemental non-categorized findings. Musculoskeletal: Unremarkable IMPRESSION: 1. 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 Gastroenterology referral for assessment and potential endoscopic ultrasound/sampling is recommended. The mass appears primarily contained to the pancreatic head without obvious invasion of adjacent structures or metastatic disease at this time. 2. The complex renal lesions bilaterally represent Bosniak category 2 cysts which are complex but benign, these renal lesions require no further workup. 3. Cholelithiasis. 4. Descending and sigmoid colon diverticulosis. 5. Abdominal aortic atherosclerosis. Stable infrarenal abdominal aortic aneurysm. These results will be called to the ordering clinician or representative by the Radiologist Assistant, and communication documented in the PACS or [employer]. Electronically Signed   By: Van Clines M.D.   On: 06/03/2020 15:46      Carmell Austria, MD 06/25/2020, 9:51 AM  Cc: Steve Roger, MD

## 2020-06-25 NOTE — Patient Instructions (Addendum)
If you are age 76 or older, your body mass index should be between 23-30. Your Body mass index is 22.99 kg/m. If this is out of the aforementioned range listed, please consider follow up with your Primary Care Provider.  If you are age 46 or younger, your body mass index should be between 19-25. Your Body mass index is 22.99 kg/m. If this is out of the aformentioned range listed, please consider follow up with your Primary Care Provider.   Please call us when you are ready to dicuss options regarding health.  Thank you,  Dr. Jackquline Denmark

## 2020-06-26 ENCOUNTER — Other Ambulatory Visit: Payer: Self-pay | Admitting: Physician Assistant

## 2020-07-07 ENCOUNTER — Other Ambulatory Visit: Payer: Self-pay | Admitting: Cardiovascular Disease

## 2020-07-15 ENCOUNTER — Other Ambulatory Visit: Payer: Self-pay | Admitting: Cardiovascular Disease

## 2020-07-15 DIAGNOSIS — E785 Hyperlipidemia, unspecified: Secondary | ICD-10-CM

## 2020-07-15 DIAGNOSIS — I25119 Atherosclerotic heart disease of native coronary artery with unspecified angina pectoris: Secondary | ICD-10-CM

## 2020-07-17 ENCOUNTER — Ambulatory Visit: Payer: Medicare Other | Admitting: Gastroenterology

## 2020-07-19 ENCOUNTER — Other Ambulatory Visit: Payer: Self-pay | Admitting: Cardiovascular Disease

## 2020-07-25 DIAGNOSIS — I10 Essential (primary) hypertension: Secondary | ICD-10-CM | POA: Diagnosis not present

## 2020-07-25 DIAGNOSIS — Z0000 Encounter for general adult medical examination without abnormal findings: Secondary | ICD-10-CM | POA: Diagnosis not present

## 2020-07-25 DIAGNOSIS — E7800 Pure hypercholesterolemia, unspecified: Secondary | ICD-10-CM | POA: Diagnosis not present

## 2020-07-25 DIAGNOSIS — I25118 Atherosclerotic heart disease of native coronary artery with other forms of angina pectoris: Secondary | ICD-10-CM | POA: Diagnosis not present

## 2020-07-25 DIAGNOSIS — I2510 Atherosclerotic heart disease of native coronary artery without angina pectoris: Secondary | ICD-10-CM | POA: Diagnosis not present

## 2020-07-25 DIAGNOSIS — Z23 Encounter for immunization: Secondary | ICD-10-CM | POA: Diagnosis not present

## 2020-07-25 DIAGNOSIS — Z1389 Encounter for screening for other disorder: Secondary | ICD-10-CM | POA: Diagnosis not present

## 2020-07-25 DIAGNOSIS — Z720 Tobacco use: Secondary | ICD-10-CM | POA: Diagnosis not present

## 2020-07-25 DIAGNOSIS — Z6822 Body mass index (BMI) 22.0-22.9, adult: Secondary | ICD-10-CM | POA: Diagnosis not present

## 2020-07-25 DIAGNOSIS — J449 Chronic obstructive pulmonary disease, unspecified: Secondary | ICD-10-CM | POA: Diagnosis not present

## 2020-08-12 ENCOUNTER — Other Ambulatory Visit: Payer: Self-pay | Admitting: Cardiovascular Disease

## 2020-08-22 DIAGNOSIS — C44222 Squamous cell carcinoma of skin of right ear and external auricular canal: Secondary | ICD-10-CM | POA: Diagnosis not present

## 2020-08-27 NOTE — Progress Notes (Signed)
[facility] [place]                                       Cardiology Office Note    Date:  08/28/2020   ID:  Steve Durham, DOB [birth year], MRN [id number]  PCP:  Townsend Roger, MD  Cardiologist:  Dr. Angelena Form / Dr. Angelena Form & Dr. Cyndia Bent (TAVR)  CC: 1 year s/p TAVR  History of Present Illness:  Steve Durham is a 74 y.o. 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   He underwent CABG x5 by Dr. Servando Snare in 1993 with subsequent PCI/stenting of the mid LAD extending back into the LIMA graft in 2014 and stenting of the proximal left circumflex back into the left main in 2015. He was readmitted in October 2015 with unstable angina and catheterization showed stenosis within the left main stent and OM 1.  A drug-eluting stent was placed in the first OM and the left main stent restenosis was treated with balloon angioplasty.  He had repeat catheterizations in April 2016 and June 2016 with stable disease.  He has continued smoking with severe COPD.  He has aortic stenosis that has been followed by Dr. Angelena Form.  An echo in June 2019 showed a left ventricular ejection fraction of 50 to 55% with moderate aortic stenosis with a mean gradient of 20 mmHg.  There was also moderate aortic insufficiency. Repeat echocardiogram on 07/05/2019 showed a drop in his left ventricular ejection fraction to 30 to 35%.  The mean gradient across aortic valve was 24 mmHg with a peak gradient of 44.6 mmHg.  There was moderate to severe aortic insufficiency.  He reported a several month history of progressive exertional fatigue and shortness of breath as well as some chest tightness. Pre TAVR cath showed severe triple vessel CAD s/p 3V CABG. The only patent graft was the LIMA to the LAD.  Chronic occlusion ostial RCA (not injected). The distal RCA/PDA fills from left to right collaterals from the LAD and from the Circumflex. Patent left main stent into the proximal Circumflex and first obtuse marginal branch. There was moderate restenosis in this stented segment but it did not appear to be flow limiting and unchanged from last cath in 2019. Chronic occlusion proximal LAD. The mid and distal LAD fills from the patent LIMA graft. The stent from the LAD into the LIMA graft was patent without restenosis.   He was evaluated by the multidisciplinary valve team and underwent successful TAVR with a 26 mm Edwards S3U via the left subclavian approach on 09/04/19. Post op echo showed improvement in his LV systolic function with EF 35-40%, normally functioning TAVR with a mean gradient of 8.5 mmHg and no PVL. He was discharged on aspirin and plavix. 1 month echo showed EF 30-35% with normally functioning TAVR with mean gradient of 10 mmHg and no PVL.  Of note, there was a renal lesion on his pre TAVR scan. We got a follow up MRI which showed renal cysts and new 3.7 cm pancreatic head lesion highly concerning for malignant pancreatic adenocarcinoma. Gastroenterology referral for assessment and potential endoscopic ultrasound/sampling was recommended. He established care with Dr.  Lyndel Safe and opted for no follow up of this.   Today he presents to clinic for follow up. Feeling terrible. Breathing worse. Still smoking a pack and a half each day. Has shortness of breath at rest. He sometimes gets chest pain, but mostly at night when laying down. Not related to exertion. SL NTG does not seem to help. Thinks is could be his gerd. No LE edema, orthopnea or PND. No dizziness or syncope. No blood in stool or urine. No palpitations. Very fatigued all the time. Continues to loose weight. Not as active as he used to be. Still tries to work on yard.     Past Medical History:  Diagnosis Date  . Abnormal CT scan,  kidney    07/2012 - multiple small cysts  . Aortic stenosis    a. Mod AS/AI by cath 07/2012.;  b.  Echo (07/31/13) is: EF 55-60%, mild aortic stenosis (mean gradient 13);  c. Echo (5/15):  EF 40-45%, mild AS (mean 12 mmHg), mod AI  . Back pain    resolved per patient 08/31/19  . CAD (coronary artery disease)   . Carotid artery occlusion    a. Dopplers 07/2012: no significant high grade obstruction.  . Cholelithiasis    Seen on CT 07/2012  . COPD (chronic obstructive pulmonary disease) ([place])   . Coronary artery disease    a. CABG 10/1991. b. cath 07/2012 with prog dsz, turned down for re-do CABG - for med rx for now.; c. NSTEMI/PCI: DES to mLAD ext into IMA graft;  d. Inf STEMI (5/15):  LM 60-70% then 99% before LAD, pLAD occl, pCFX stent 80+% ISR, oRCA 99% then occl (L-R collats to dRCA), L-LAD ok with patent stent, S-CFX occl (old), S-RCA occl (old); PCI: LM ext into CFX with Xience Alpine DES  . Dysphagia   . Dyspnea    with exertion  . GERD (gastroesophageal reflux disease)   . Heart failure (HCC) systolic  . Hyperlipidemia   . Hyperlipoproteinemia   . Hypertension   . Ischemic cardiomyopathy    a. 2D ECHO: 08/02/2014; EF 45%; severe hypokinesis base/mid-inferolat segments and severe hypokinesis base inferior segment. Mild LVH, mild AS (may be underestimated due to LV dysfunction).  Mean gradient (S): 14 mm Hg. Peak gradient (S): 25 mm Hg. VTI ratio of LVOT to aortic valve: 0.37. Mod LA dilation. Mild RV systolic dysfxn     Past Surgical History:  Procedure Laterality Date  . CARDIAC CATHETERIZATION  04/03/99  . CARDIAC CATHETERIZATION N/A 03/20/2015   Procedure: Left Heart Cath and Cors/Grafts Angiography;  Surgeon: Jettie Booze, MD;  Location: [place] [facility];  Service: Cardiovascular;  Laterality: N/A;  . CARDIAC CATHETERIZATION N/A 04/28/2016   Procedure: Right/Left Heart Cath and Coronary/Graft Angiography;  Surgeon: Burnell Blanks, MD;  Location: [place] [facility];  Service: Cardiovascular;  Laterality: N/A;  . CARPAL TUNNEL RELEASE  2007   Excision mass dorsal left wrist  . CORONARY ARTERY BYPASS GRAFT  1993  . CORONARY ARTERY BYPASS GRAFT     [id number]  . FASCIOTOMY Right 07/30/2013   Procedure: OPEN FASCIOTOMY RIGHT RING FINGER & RIGHT SMALL FINGER MULTIPLE LEVELS;  Surgeon: Wynonia Sours, MD;  Location: [place];  Service: Orthopedics;  Laterality: Right;  . HERNIA REPAIR  1988  . INGUINAL HERNIA REPAIR Left 06/07/2018   Procedure: LEFT INGUINAL HERNIA REPAIR;  Surgeon: [name], MD;  Location: [place];  Service: General;  Laterality: Left;  .  INSERTION OF MESH Left 06/07/2018   Procedure: INSERTION OF MESH;  Surgeon: [name], MD;  Location: [place];  Service: General;  Laterality: Left;  . LEFT HEART CATHETERIZATION WITH CORONARY ANGIOGRAM N/A 02/28/2014   Procedure: LEFT HEART CATHETERIZATION WITH CORONARY ANGIOGRAM;  Surgeon: Troy Sine, MD;  Location: [facility] CATH LAB;  Service: Cardiovascular;  Laterality: N/A;  . LEFT HEART CATHETERIZATION WITH CORONARY/GRAFT ANGIOGRAM N/A 08/10/2012   Procedure: LEFT HEART CATHETERIZATION WITH Beatrix Fetters;  Surgeon: Hillary Bow, MD;  Location: [facility] CATH LAB;  Service: Cardiovascular;  Laterality: N/A;  . LEFT HEART CATHETERIZATION WITH CORONARY/GRAFT ANGIOGRAM N/A 08/01/2014   Procedure: LEFT HEART CATHETERIZATION WITH Beatrix Fetters;  Surgeon: Leonie Man, MD;  Location: [facility] CATH LAB;  Service: Cardiovascular;  Laterality: N/A;  . LEFT HEART CATHETERIZATION WITH CORONARY/GRAFT ANGIOGRAM N/A 02/12/2015   Procedure: LEFT HEART CATHETERIZATION WITH Beatrix Fetters;  Surgeon: Burnell Blanks, MD;  Location: [facility] CATH LAB;  Service: Cardiovascular;  Laterality: N/A;  . PERCUTANEOUS CORONARY STENT INTERVENTION (PCI-S)  07/31/2013   Procedure: PERCUTANEOUS CORONARY STENT INTERVENTION (PCI-S);  Surgeon: Burnell Blanks, MD;  Location: [facility] CATH LAB;   Service: Cardiovascular;;  LIMA to LAD at the anastomosis with aortic root shot   . RIGHT/LEFT HEART CATH AND CORONARY/GRAFT ANGIOGRAPHY N/A 04/04/2018   Procedure: RIGHT/LEFT HEART CATH AND CORONARY/GRAFT ANGIOGRAPHY;  Surgeon: Martinique, Peter M, MD;  Location: [place] [facility];  Service: Cardiovascular;  Laterality: N/A;  . RIGHT/LEFT HEART CATH AND CORONARY/GRAFT ANGIOGRAPHY N/A 08/01/2019   Procedure: RIGHT/LEFT HEART CATH AND CORONARY/GRAFT ANGIOGRAPHY;  Surgeon: Burnell Blanks, MD;  Location: [place] [facility];  Service: Cardiovascular;  Laterality: N/A;  . TEE WITHOUT CARDIOVERSION N/A 09/04/2019   Procedure: TRANSESOPHAGEAL ECHOCARDIOGRAM (TEE);  Surgeon: Burnell Blanks, MD;  Location: [place];  Service: Open Heart Surgery;  Laterality: N/A;  . ULTRASOUND GUIDANCE FOR VASCULAR ACCESS Right 09/04/2019   Procedure: Ultrasound Guidance For Vascular Access;  Surgeon: Burnell Blanks, MD;  Location: [place];  Service: Open Heart Surgery;  Laterality: Right;    Current Medications: Outpatient Medications Prior to Visit  Medication Sig Dispense Refill  . amLODipine (NORVASC) 5 MG tablet TAKE 1 TABLET BY MOUTH ONCE DAILY 90 tablet 3  . atorvastatin (LIPITOR) 80 MG tablet TAKE 1 TABLET BY MOUTH ONCE DAILY. 90 tablet 3  . bisoprolol (ZEBETA) 5 MG tablet TAKE 1/2 TABLET BY MOUTH TWICE A DAY 90 tablet 3  . budesonide (PULMICORT) 0.25 MG/2ML nebulizer solution Take 2 mLs (0.25 mg total) by nebulization 2 (two) times daily. Dx: J44.9 120 mL 5  . clopidogrel (PLAVIX) 75 MG tablet TAKE 1 TABLET BY MOUTH ONCE DAILY 90 tablet 3  . colchicine 0.6 MG tablet Take 0.6 mg by mouth as needed.    . fexofenadine (ALLEGRA) 180 MG tablet Take 180 mg by mouth daily.     . formoterol (PERFOROMIST) 20 MCG/2ML nebulizer solution Take 2 mLs (20 mcg total) by nebulization 2 (two) times daily. DX: J44.9 120 mL 5  . furosemide (LASIX) 40 MG tablet TAKE 1 TABLET BY MOUTH TWICE(2) DAILY 180 tablet 3    . isosorbide mononitrate (IMDUR) 60 MG 24 hr tablet TAKE 1 TABLET BY MOUTH TWICE DAILY. 180 tablet 3  . nitroGLYCERIN (NITROSTAT) 0.4 MG SL tablet TAKE 1 TABLET UNDER THE TONGUE AS NEEDEDFOR CHEST PAIN ( MAY REPEAT EVERY 5 MINUTES X 3) 25 tablet 5  . pantoprazole (PROTONIX) 40 MG tablet TAKE 1 TABLET BY MOUTH ONCE  DAILY 90 tablet 3  . potassium chloride SA (KLOR-CON) 20 MEQ tablet TAKE 1 TABLET BY MOUTH ONCE DAILY. 90 tablet 3  . Simethicone (GAS-X PO) Take 1 tablet by mouth daily.    Marland Kitchen spironolactone (ALDACTONE) 25 MG tablet TAKE 1/2 TABLET BY MOUTH TWICE A DAY 90 tablet 1   No facility-administered medications prior to visit.     Allergies:   Patient has no known allergies.   Social History   Socioeconomic History  . Marital status: Married    Spouse name: Not on file  . Number of children: 1  . Years of education: Not on file  . Highest education level: Not on file  Occupational History  . Occupation: Plumbing    Comment: Retired in 2007  Tobacco Use  . Smoking status: Current Every Day Smoker    Packs/day: 1.50    Years: 62.00    Pack years: 93.00    Types: Cigarettes  . Smokeless tobacco: Never Used  Vaping Use  . Vaping Use: Never used  Substance and Sexual Activity  . Alcohol use: Yes    Alcohol/week: 14.0 standard drinks    Types: 14 Cans of beer per week    Comment: beer once in awhile  . Drug use: No  . Sexual activity: Not on file  Other Topics Concern  . Not on file  Social History Narrative   Lives in [place] with his family.  Does not routinely exercise.   Social Determinants of Health   Financial Resource Strain:   . Difficulty of Paying Living Expenses: Not on file  Food Insecurity:   . Worried About Charity fundraiser in the Last Year: Not on file  . Ran Out of Food in the Last Year: Not on file  Transportation Needs:   . Lack of Transportation (Medical): Not on file  . Lack of Transportation (Non-Medical): Not on file  Physical Activity:    . Days of Exercise per Week: Not on file  . Minutes of Exercise per Session: Not on file  Stress:   . Feeling of Stress : Not on file  Social Connections:   . Frequency of Communication with Friends and Family: Not on file  . Frequency of Social Gatherings with Friends and Family: Not on file  . Attends Religious Services: Not on file  . Active Member of Clubs or Organizations: Not on file  . Attends Archivist Meetings: Not on file  . Marital Status: Not on file     Family History:  The patient's family history includes Allergies in his sister; Emphysema in his brother; Heart attack in his mother; Hypertension in his brother, father, mother, and sister.     ROS:   Please see the history of present illness.    ROS All other systems reviewed and are negative.   PHYSICAL EXAM:   VS:  BP 126/68   Pulse (!) 50   Ht 5\' 5"  (1.651 m)   Wt 131 lb 3.2 oz (59.5 kg)   SpO2 98%   BMI 21.83 kg/m    GEN: chronically ill appearing.  HEENT: normal Neck: no JVD or masses Cardiac: RRR; soft flow murmur., rubs, or gallops,no edema  Respiratory: appears short of breath at rest. Wheezy. GI: soft, nontender, nondistended, + BS MS: no deformity or atrophy Skin: warm and dry, no rash.  Neuro:  Alert and Oriented x 3, Strength and sensation are intact Psych: euthymic mood, full affect   Wt Readings from  Last 3 Encounters:  08/28/20 131 lb 3.2 oz (59.5 kg)  06/25/20 138 lb 2 oz (62.7 kg)  05/09/20 138 lb 6.4 oz (62.8 kg)      Studies/Labs Reviewed:   EKG:  EKG is NOT ordered today.    Recent Labs: 08/31/2019: ALT 25; B Natriuretic Peptide 839.8 09/05/2019: Hemoglobin 13.3; Magnesium 1.9; Platelets 152 05/16/2020: BUN 20; Creatinine, Ser 1.41; Potassium 4.4; Sodium 138   Lipid Panel    Component Value Date/Time   CHOL 149 06/21/2019 0953   TRIG 88 06/21/2019 0953   HDL 54 06/21/2019 0953   CHOLHDL 2.8 06/21/2019 0953   CHOLHDL 3.3 08/18/2015 1056   VLDL 26 08/18/2015  1056   [place] 78 06/21/2019 0953    Additional studies/ records that were reviewed today include:   TAVR OPERATIVE NOTE   Date of Procedure:                09/04/2019  Preoperative Diagnosis:      Severe Aortic Stenosis   Postoperative Diagnosis:    Same   Procedure:        Transcatheter Aortic Valve Replacement - Left Subclavian Approach             Edwards Sapien 3 Ultra THV (size 26 mm, model # 9750TFX, serial # [id number])              Co-Surgeons:                        Gaye Pollack, MD and Lauree Chandler, MD  Anesthesiologist:                  Perfecto Kingdom, MD  Echocardiographer:              Liane Comber, MD  Pre-operative Echo Findings: ? Severe aortic stenosis ? Moderate left ventricular systolic dysfunction  Post-operative Echo Findings: ? No paravalvular leak ? Moderate left ventricular systolic dysfunction   ______________   Echo 09/05/19 IMPRESSIONS  1. Left ventricular ejection fraction, by visual estimation, is 35 to 40%. The left ventricle has moderately decreased function. There is no left ventricular hypertrophy.  2. Abnormal septal motion consistent with left bundle branch block.  3. Elevated left atrial pressure.  4. Left ventricular diastolic parameters are consistent with Grade II diastolic dysfunction (pseudonormalization).  5. Moderately dilated left ventricular internal cavity size.  6. The left ventricle has no regional wall motion abnormalities.  7. Global right ventricle has normal systolic function.The right ventricular size is normal. No increase in right ventricular wall thickness.  8. Left atrial size was severely dilated.  9. Right atrial size was normal. 10. The mitral valve is normal in structure. Trace mitral valve regurgitation. 11. The tricuspid valve is normal in structure. Tricuspid valve regurgitation is not demonstrated. 12. Aortic valve regurgitation is not visualized. 13. The pulmonic valve was normal in  structure. Pulmonic valve regurgitation is not visualized. 14. TR signal is inadequate for assessing pulmonary artery systolic pressure. 15. The inferior vena cava is normal in size with <50% respiratory variability, suggesting right atrial pressure of 8 mmHg.  ______________   Echo 10/04/19 IMPRESSIONS  1. Left ventricular ejection fraction, by visual estimation, is 30 to 35%. The left ventricle has severely decreased function. There is no left ventricular hypertrophy.  2. Definity contrast agent was given IV to delineate the left ventricular endocardial borders.  3. Left ventricular diastolic parameters are consistent with Grade I diastolic dysfunction (impaired  relaxation).  4. The left ventricle demonstrates global hypokinesis.  5. Global right ventricle has moderately reduced systolic function.The right ventricular size is moderately enlarged. No increase in right ventricular wall thickness.  6. Left atrial size was mildly dilated.  7. Right atrial size was mildly dilated.  8. The mitral valve is normal in structure. Mild mitral valve regurgitation. No evidence of mitral stenosis.  9. The tricuspid valve is normal in structure. Tricuspid valve regurgitation is not demonstrated. 10. Aortic valve regurgitation is not visualized. No evidence of aortic valve sclerosis or stenosis. 11. The pulmonic valve was normal in structure. Pulmonic valve regurgitation is not visualized. 12. The inferior vena cava is normal in size with greater than 50% respiratory variability, suggesting right atrial pressure of 3 mmHg. 13. LVEF has decreased now 30-35% with diffuse hypokinesis. Normal transaortic gradients with peak/mean gradients 20/10 mmHg, no paravalvular leak.  Aortic Valve: The aortic valve has been repaired/replaced. Aortic valve regurgitation is not visualized. The aortic valve is structurally normal, with no evidence of sclerosis or stenosis. Aortic valve mean gradient measures 9.5 mmHg.  Aortic valve peak gradient measures 18.2 mmHg. Aortic valve area, by VTI measures 1.61 cm. 26 Edwards Edwards Sapien bioprosthetic, stented aortic valve (TAVR) valve is present in the aortic position. Echo findings show normal structure and function of the aortic  prosthesis.  ______________________   10/04/19 IMPRESSIONS  1. Left ventricular ejection fraction, by visual estimation, is 30 to 35%. The left ventricle has severely decreased function. There is no left ventricular hypertrophy.  2. Definity contrast agent was given IV to delineate the left ventricular endocardial borders.  3. Left ventricular diastolic parameters are consistent with Grade I diastolic dysfunction (impaired relaxation).  4. The left ventricle demonstrates global hypokinesis.  5. Global right ventricle has moderately reduced systolic function.The right ventricular size is moderately enlarged. No increase in right ventricular wall thickness.  6. Left atrial size was mildly dilated.  7. Right atrial size was mildly dilated.  8. The mitral valve is normal in structure. Mild mitral valve regurgitation. No evidence of mitral stenosis.  9. The tricuspid valve is normal in structure. Tricuspid valve regurgitation is not demonstrated. 10. Aortic valve regurgitation is not visualized. No evidence of aortic valve sclerosis or stenosis. 11. The pulmonic valve was normal in structure. Pulmonic valve regurgitation is not visualized. 12. The inferior vena cava is normal in size with greater than 50% respiratory variability, suggesting right atrial pressure of 3 mmHg. 13. LVEF has decreased now 30-35% with diffuse hypokinesis. Normal transaortic gradients with peak/mean gradients 20/10 mmHg, no paravalvular leak.  ______________________  Echo 08/28/20 IMPRESSIONS    1. Left ventricular ejection fraction, by estimation, is 30 to 35%. The  left ventricle has moderately decreased function. The left ventricle  demonstrates  regional wall motion abnormalities (see scoring  diagram/findings for description). Left ventricular  diastolic parameters are consistent with Grade I diastolic dysfunction  (impaired relaxation).  2. Right ventricular systolic function is moderately reduced. The right  ventricular size is normal. Tricuspid regurgitation signal is inadequate  for assessing PA pressure.  3. The mitral valve is normal in structure. Mild mitral valve  regurgitation. No evidence of mitral stenosis.  4. The aortic valve has been repaired/replaced. Aortic valve  regurgitation is trivial. No aortic stenosis is present. There is a 26 mm  Sapien prosthetic (TAVR) valve present in the aortic position. Procedure  Date: 09/04/19. Aortic valve mean gradient  measures 5.0 mmHg.  5. The inferior vena cava  is normal in size with greater than 50%  respiratory variability, suggesting right atrial pressure of 3 mmHg.   Comparison(s): A prior study was performed on 10/04/2019. Prior images  reviewed side by side. Stable appearance of TAVR prosthesis.   ASSESSMENT & PLAN:   Severe AS s/p TAVR: Echo today shows EF 30-35% with normally functioning TAVR with mean gradient of 5 mmHg and no PVL. He has NYHA class IV symptoms. He appears very short of breath and wheezy at rest. I am certain this is mostly related to advanced COPD. He has amoxicillin for SBE prophylaxis.  He will continue monotherapy with plavix  Chronic combined S/D CHF: Appears euvolemic.  Continue Lasix 40 mg twice daily.  CAD: he does get intermittent chest pain at night that is probably more related to GERD. Continue medical therapy with Plavix, statin and BB.   Tobacco abuse: smoking 1.5 PPD still. He is not interested in quitting at this point  Pancreatic lesion: he and Dr. Lyndel Safe decided to not work this up as they didn't think he would do well with treatment.   I am concerned with his overall trajectory. He has probably undiagnosed pancreatic  cancer, he is losing weight, has no energy and very fatigued. He appears to be short of breath at rest. He doesn't seem to be upset by any of this and accepts this as his new baseline. I did not discuss palliative care with hm today, but suspect this may be necessary in his near future.   Medication Adjustments/Labs and Tests Ordered: Current medicines are reviewed at length with the patient today.  Concerns regarding medicines are outlined above.  Medication changes, Labs and Tests ordered today are listed in the Patient Instructions below. Patient Instructions  Medication Instructions:  Your provider recommends that you continue on your current medications as directed. Please refer to the Current Medication list given to you today.   *If you need a refill on your cardiac medications before your next appointment, please call your pharmacy*  Follow-Up: Please follow-up with Dr. Angelena Form as planned in April, 2022. You will get your letter to arrange this appointment.    Signed, Angelena Form, PA-C  08/28/2020 2:57 PM    [facility] [address], [place], [place]  [address] Phone: [phone]; Fax: [phone]

## 2020-08-28 ENCOUNTER — Other Ambulatory Visit: Payer: Self-pay

## 2020-08-28 ENCOUNTER — Encounter: Payer: Self-pay | Admitting: Physician Assistant

## 2020-08-28 ENCOUNTER — Ambulatory Visit (HOSPITAL_COMMUNITY): Payer: Medicare Other | Attending: Internal Medicine

## 2020-08-28 ENCOUNTER — Ambulatory Visit (INDEPENDENT_AMBULATORY_CARE_PROVIDER_SITE_OTHER): Payer: Medicare Other | Admitting: Physician Assistant

## 2020-08-28 VITALS — BP 126/68 | HR 50 | Ht 65.0 in | Wt 131.2 lb

## 2020-08-28 DIAGNOSIS — I350 Nonrheumatic aortic (valve) stenosis: Secondary | ICD-10-CM | POA: Diagnosis not present

## 2020-08-28 DIAGNOSIS — I25119 Atherosclerotic heart disease of native coronary artery with unspecified angina pectoris: Secondary | ICD-10-CM

## 2020-08-28 DIAGNOSIS — Z952 Presence of prosthetic heart valve: Secondary | ICD-10-CM

## 2020-08-28 DIAGNOSIS — Z720 Tobacco use: Secondary | ICD-10-CM

## 2020-08-28 DIAGNOSIS — K869 Disease of pancreas, unspecified: Secondary | ICD-10-CM | POA: Diagnosis not present

## 2020-08-28 DIAGNOSIS — I2510 Atherosclerotic heart disease of native coronary artery without angina pectoris: Secondary | ICD-10-CM | POA: Diagnosis not present

## 2020-08-28 DIAGNOSIS — I5042 Chronic combined systolic (congestive) and diastolic (congestive) heart failure: Secondary | ICD-10-CM

## 2020-08-28 NOTE — Patient Instructions (Signed)
Medication Instructions:  Your provider recommends that you continue on your current medications as directed. Please refer to the Current Medication list given to you today.   *If you need a refill on your cardiac medications before your next appointment, please call your pharmacy*  Follow-Up: Please follow-up with Dr. Angelena Form as planned in April, 2022. You will get your letter to arrange this appointment.

## 2020-08-29 LAB — ECHOCARDIOGRAM COMPLETE
AR max vel: 2.47 cm2
AV Area VTI: 3.09 cm2
AV Area mean vel: 2.67 cm2
AV Mean grad: 5 mmHg
AV Peak grad: 10.9 mmHg
Ao pk vel: 1.65 m/s
Area-P 1/2: 1.8 cm2
Height: 65 in
S' Lateral: 3.6 cm
Weight: 2099.2 oz

## 2020-09-05 DIAGNOSIS — K8689 Other specified diseases of pancreas: Secondary | ICD-10-CM | POA: Diagnosis not present

## 2020-09-05 DIAGNOSIS — R112 Nausea with vomiting, unspecified: Secondary | ICD-10-CM | POA: Diagnosis not present

## 2020-09-05 DIAGNOSIS — R17 Unspecified jaundice: Secondary | ICD-10-CM | POA: Diagnosis not present

## 2020-09-05 DIAGNOSIS — E860 Dehydration: Secondary | ICD-10-CM | POA: Diagnosis not present

## 2020-09-07 DIAGNOSIS — I5022 Chronic systolic (congestive) heart failure: Secondary | ICD-10-CM | POA: Diagnosis not present

## 2020-09-07 DIAGNOSIS — E785 Hyperlipidemia, unspecified: Secondary | ICD-10-CM | POA: Diagnosis not present

## 2020-09-07 DIAGNOSIS — I255 Ischemic cardiomyopathy: Secondary | ICD-10-CM | POA: Diagnosis not present

## 2020-09-07 DIAGNOSIS — I350 Nonrheumatic aortic (valve) stenosis: Secondary | ICD-10-CM | POA: Diagnosis not present

## 2020-09-07 DIAGNOSIS — I2510 Atherosclerotic heart disease of native coronary artery without angina pectoris: Secondary | ICD-10-CM | POA: Diagnosis not present

## 2020-09-07 DIAGNOSIS — J449 Chronic obstructive pulmonary disease, unspecified: Secondary | ICD-10-CM | POA: Diagnosis not present

## 2020-09-07 DIAGNOSIS — C259 Malignant neoplasm of pancreas, unspecified: Secondary | ICD-10-CM | POA: Diagnosis not present

## 2020-09-07 DIAGNOSIS — K219 Gastro-esophageal reflux disease without esophagitis: Secondary | ICD-10-CM | POA: Diagnosis not present

## 2020-09-07 DIAGNOSIS — F17210 Nicotine dependence, cigarettes, uncomplicated: Secondary | ICD-10-CM | POA: Diagnosis not present

## 2020-09-07 DIAGNOSIS — Z951 Presence of aortocoronary bypass graft: Secondary | ICD-10-CM | POA: Diagnosis not present

## 2020-09-07 DIAGNOSIS — I110 Hypertensive heart disease with heart failure: Secondary | ICD-10-CM | POA: Diagnosis not present

## 2020-09-08 ENCOUNTER — Telehealth: Payer: Self-pay | Admitting: Adult Health

## 2020-09-08 DIAGNOSIS — I110 Hypertensive heart disease with heart failure: Secondary | ICD-10-CM | POA: Diagnosis not present

## 2020-09-08 DIAGNOSIS — C259 Malignant neoplasm of pancreas, unspecified: Secondary | ICD-10-CM | POA: Diagnosis not present

## 2020-09-08 DIAGNOSIS — Z951 Presence of aortocoronary bypass graft: Secondary | ICD-10-CM | POA: Diagnosis not present

## 2020-09-08 DIAGNOSIS — J449 Chronic obstructive pulmonary disease, unspecified: Secondary | ICD-10-CM | POA: Diagnosis not present

## 2020-09-08 DIAGNOSIS — I350 Nonrheumatic aortic (valve) stenosis: Secondary | ICD-10-CM | POA: Diagnosis not present

## 2020-09-08 DIAGNOSIS — I2510 Atherosclerotic heart disease of native coronary artery without angina pectoris: Secondary | ICD-10-CM | POA: Diagnosis not present

## 2020-09-08 NOTE — Telephone Encounter (Signed)
Spoke with daughter, Lovey Newcomer She wanted to let TP know that pt passed away this morning  I gave condolences and advised will inform TP

## 2020-09-17 DEATH — deceased

## 2020-11-13 ENCOUNTER — Ambulatory Visit: Payer: Medicare Other | Admitting: Adult Health
